# Patient Record
Sex: Male | Born: 1945 | Race: White | Hispanic: No | Marital: Married | Smoking: Former smoker
Health system: Southern US, Community
[De-identification: ages and names within clinical notes are randomized; demographics above are authoritative.]

## PROBLEM LIST (undated history)

## (undated) DIAGNOSIS — Z9989 Dependence on other enabling machines and devices: Secondary | ICD-10-CM

## (undated) DIAGNOSIS — I219 Acute myocardial infarction, unspecified: Secondary | ICD-10-CM

## (undated) DIAGNOSIS — Z8719 Personal history of other diseases of the digestive system: Secondary | ICD-10-CM

## (undated) DIAGNOSIS — E785 Hyperlipidemia, unspecified: Secondary | ICD-10-CM

## (undated) DIAGNOSIS — G43909 Migraine, unspecified, not intractable, without status migrainosus: Secondary | ICD-10-CM

## (undated) DIAGNOSIS — R7303 Prediabetes: Secondary | ICD-10-CM

## (undated) DIAGNOSIS — G4733 Obstructive sleep apnea (adult) (pediatric): Secondary | ICD-10-CM

## (undated) DIAGNOSIS — K219 Gastro-esophageal reflux disease without esophagitis: Secondary | ICD-10-CM

## (undated) DIAGNOSIS — I639 Cerebral infarction, unspecified: Secondary | ICD-10-CM

## (undated) DIAGNOSIS — I2510 Atherosclerotic heart disease of native coronary artery without angina pectoris: Secondary | ICD-10-CM

## (undated) DIAGNOSIS — E119 Type 2 diabetes mellitus without complications: Secondary | ICD-10-CM

## (undated) DIAGNOSIS — M1990 Unspecified osteoarthritis, unspecified site: Secondary | ICD-10-CM

## (undated) DIAGNOSIS — C44221 Squamous cell carcinoma of skin of unspecified ear and external auricular canal: Secondary | ICD-10-CM

## (undated) DIAGNOSIS — Z8711 Personal history of peptic ulcer disease: Secondary | ICD-10-CM

## (undated) DIAGNOSIS — IMO0002 Reserved for concepts with insufficient information to code with codable children: Secondary | ICD-10-CM

## (undated) DIAGNOSIS — I10 Essential (primary) hypertension: Secondary | ICD-10-CM

## (undated) HISTORY — PX: HERNIA REPAIR: SHX51

## (undated) HISTORY — PX: BACK SURGERY: SHX140

## (undated) HISTORY — DX: Reserved for concepts with insufficient information to code with codable children: IMO0002

## (undated) HISTORY — PX: CERVICAL DISC SURGERY: SHX588

## (undated) HISTORY — PX: TRIGGER FINGER RELEASE: SHX641

## (undated) HISTORY — DX: Atherosclerotic heart disease of native coronary artery without angina pectoris: I25.10

## (undated) HISTORY — PX: SHOULDER ARTHROSCOPY W/ ROTATOR CUFF REPAIR: SHX2400

## (undated) HISTORY — DX: Migraine, unspecified, not intractable, without status migrainosus: G43.909

## (undated) HISTORY — DX: Gastro-esophageal reflux disease without esophagitis: K21.9

## (undated) HISTORY — DX: Hyperlipidemia, unspecified: E78.5

---

## 1998-02-25 ENCOUNTER — Ambulatory Visit (HOSPITAL_BASED_OUTPATIENT_CLINIC_OR_DEPARTMENT_OTHER): Admission: RE | Admit: 1998-02-25 | Discharge: 1998-02-25 | Payer: Self-pay | Admitting: *Deleted

## 2001-02-06 ENCOUNTER — Encounter (INDEPENDENT_AMBULATORY_CARE_PROVIDER_SITE_OTHER): Payer: Self-pay

## 2001-02-06 ENCOUNTER — Ambulatory Visit (HOSPITAL_COMMUNITY): Admission: RE | Admit: 2001-02-06 | Discharge: 2001-02-06 | Payer: Self-pay | Admitting: Gastroenterology

## 2001-12-11 ENCOUNTER — Ambulatory Visit (HOSPITAL_COMMUNITY): Admission: RE | Admit: 2001-12-11 | Discharge: 2001-12-11 | Payer: Self-pay | Admitting: Cardiology

## 2001-12-11 ENCOUNTER — Encounter: Payer: Self-pay | Admitting: Cardiology

## 2002-08-22 ENCOUNTER — Encounter: Admission: RE | Admit: 2002-08-22 | Discharge: 2002-08-22 | Payer: Self-pay | Admitting: Internal Medicine

## 2002-08-22 ENCOUNTER — Encounter: Payer: Self-pay | Admitting: Internal Medicine

## 2004-11-11 ENCOUNTER — Ambulatory Visit: Payer: Self-pay | Admitting: Internal Medicine

## 2005-01-13 ENCOUNTER — Ambulatory Visit: Payer: Self-pay | Admitting: Internal Medicine

## 2005-02-09 ENCOUNTER — Ambulatory Visit: Payer: Self-pay | Admitting: Internal Medicine

## 2005-02-14 ENCOUNTER — Ambulatory Visit: Payer: Self-pay | Admitting: Internal Medicine

## 2005-03-02 ENCOUNTER — Ambulatory Visit: Payer: Self-pay | Admitting: Internal Medicine

## 2005-06-15 ENCOUNTER — Ambulatory Visit: Payer: Self-pay | Admitting: Internal Medicine

## 2005-07-28 ENCOUNTER — Ambulatory Visit: Payer: Self-pay | Admitting: Internal Medicine

## 2005-09-16 ENCOUNTER — Ambulatory Visit: Payer: Self-pay | Admitting: Internal Medicine

## 2005-10-31 ENCOUNTER — Ambulatory Visit: Payer: Self-pay | Admitting: Internal Medicine

## 2006-03-08 ENCOUNTER — Ambulatory Visit: Payer: Self-pay | Admitting: Internal Medicine

## 2006-05-08 ENCOUNTER — Ambulatory Visit: Payer: Self-pay | Admitting: Internal Medicine

## 2006-05-11 ENCOUNTER — Ambulatory Visit: Payer: Self-pay | Admitting: Internal Medicine

## 2006-08-29 ENCOUNTER — Ambulatory Visit (HOSPITAL_BASED_OUTPATIENT_CLINIC_OR_DEPARTMENT_OTHER): Admission: RE | Admit: 2006-08-29 | Discharge: 2006-08-29 | Payer: Self-pay | Admitting: Orthopedic Surgery

## 2006-10-18 ENCOUNTER — Encounter (INDEPENDENT_AMBULATORY_CARE_PROVIDER_SITE_OTHER): Payer: Self-pay | Admitting: *Deleted

## 2006-10-18 ENCOUNTER — Ambulatory Visit: Payer: Self-pay | Admitting: Internal Medicine

## 2006-10-18 LAB — CONVERTED CEMR LAB
ALT: 29 units/L (ref 0–40)
AST: 25 units/L (ref 0–37)
BUN: 11 mg/dL (ref 6–23)
CO2: 30 meq/L (ref 19–32)
Calcium: 9.4 mg/dL (ref 8.4–10.5)
Chloride: 104 meq/L (ref 96–112)
Chol/HDL Ratio, serum: 3.1
Cholesterol: 170 mg/dL (ref 0–200)
Creatinine, Ser: 0.9 mg/dL (ref 0.4–1.5)
GFR calc non Af Amer: 91 mL/min
Glomerular Filtration Rate, Af Am: 110 mL/min/{1.73_m2}
Glucose, Bld: 101 mg/dL — ABNORMAL HIGH (ref 70–99)
HCT: 44.6 % (ref 39.0–52.0)
HDL: 54.4 mg/dL (ref >39.0)
Hemoglobin: 15.1 g/dL (ref 13.0–17.0)
LDL Cholesterol: 101 mg/dL — ABNORMAL HIGH (ref 0–99)
MCHC: 33.9 g/dL (ref 30.0–36.0)
MCV: 94.3 fL (ref 78.0–100.0)
PSA: 1.24 ng/mL
PSA: 1.24 ng/mL (ref 0.10–4.00)
Platelets: 212 10*3/uL (ref 150–400)
Potassium: 4.6 meq/L (ref 3.5–5.1)
RBC: 4.72 M/uL (ref 4.22–5.81)
RDW: 12.6 % (ref 11.5–14.6)
Sodium: 140 meq/L (ref 135–145)
Triglyceride fasting, serum: 72 mg/dL (ref 0–149)
VLDL: 14 mg/dL (ref 0–40)
WBC: 4.8 10*3/uL (ref 4.5–10.5)

## 2007-04-02 DIAGNOSIS — G4730 Sleep apnea, unspecified: Secondary | ICD-10-CM | POA: Insufficient documentation

## 2007-04-02 DIAGNOSIS — K219 Gastro-esophageal reflux disease without esophagitis: Secondary | ICD-10-CM | POA: Insufficient documentation

## 2007-06-08 ENCOUNTER — Encounter: Payer: Self-pay | Admitting: Internal Medicine

## 2007-07-05 ENCOUNTER — Encounter: Payer: Self-pay | Admitting: Internal Medicine

## 2007-08-08 ENCOUNTER — Encounter: Payer: Self-pay | Admitting: Internal Medicine

## 2007-09-07 ENCOUNTER — Telehealth (INDEPENDENT_AMBULATORY_CARE_PROVIDER_SITE_OTHER): Payer: Self-pay | Admitting: *Deleted

## 2007-09-25 ENCOUNTER — Encounter: Payer: Self-pay | Admitting: Internal Medicine

## 2007-10-09 ENCOUNTER — Encounter: Payer: Self-pay | Admitting: Internal Medicine

## 2007-10-18 ENCOUNTER — Encounter: Payer: Self-pay | Admitting: Internal Medicine

## 2007-10-24 ENCOUNTER — Telehealth (INDEPENDENT_AMBULATORY_CARE_PROVIDER_SITE_OTHER): Payer: Self-pay | Admitting: *Deleted

## 2007-11-13 ENCOUNTER — Encounter: Payer: Self-pay | Admitting: Internal Medicine

## 2007-11-20 ENCOUNTER — Encounter: Payer: Self-pay | Admitting: Internal Medicine

## 2008-01-01 ENCOUNTER — Ambulatory Visit: Payer: Self-pay | Admitting: Internal Medicine

## 2008-01-01 LAB — CONVERTED CEMR LAB
ALT: 23 units/L (ref 0–53)
AST: 24 units/L (ref 0–37)
Albumin: 4 g/dL (ref 3.5–5.2)
Alkaline Phosphatase: 60 units/L (ref 39–117)
BUN: 13 mg/dL (ref 6–23)
Basophils Absolute: 0 10*3/uL (ref 0.0–0.1)
Basophils Relative: 0.6 % (ref 0.0–1.0)
Bilirubin, Direct: 0.1 mg/dL (ref 0.0–0.3)
CO2: 23 meq/L (ref 19–32)
Calcium: 9.4 mg/dL (ref 8.4–10.5)
Chloride: 106 meq/L (ref 96–112)
Cholesterol: 195 mg/dL (ref 0–200)
Creatinine, Ser: 0.9 mg/dL (ref 0.4–1.5)
Eosinophils Absolute: 0.1 10*3/uL (ref 0.0–0.6)
Eosinophils Relative: 1.4 % (ref 0.0–5.0)
GFR calc Af Amer: 109 mL/min
GFR calc non Af Amer: 90 mL/min
Glucose, Bld: 101 mg/dL — ABNORMAL HIGH (ref 70–99)
HCT: 44 % (ref 39.0–52.0)
HDL: 61.9 mg/dL (ref >39.0)
Hemoglobin: 14.8 g/dL (ref 13.0–17.0)
LDL Cholesterol: 107 mg/dL — ABNORMAL HIGH (ref 0–99)
Lymphocytes Relative: 18.1 % (ref 12.0–46.0)
MCHC: 33.5 g/dL (ref 30.0–36.0)
MCV: 94 fL (ref 78.0–100.0)
Monocytes Absolute: 0.6 10*3/uL (ref 0.2–0.7)
Monocytes Relative: 9.1 % (ref 3.0–11.0)
Neutro Abs: 4.9 10*3/uL (ref 1.4–7.7)
Neutrophils Relative %: 70.8 % (ref 43.0–77.0)
PSA: 0.69 ng/mL (ref 0.10–4.00)
Platelets: 179 10*3/uL (ref 150–400)
Potassium: 4.2 meq/L (ref 3.5–5.1)
RBC: 4.68 M/uL (ref 4.22–5.81)
RDW: 12.8 % (ref 11.5–14.6)
Sodium: 140 meq/L (ref 135–145)
TSH: 1.55 microintl units/mL (ref 0.35–5.50)
Total Bilirubin: 0.9 mg/dL (ref 0.3–1.2)
Total CHOL/HDL Ratio: 3.2
Total Protein: 7 g/dL (ref 6.0–8.3)
Triglycerides: 131 mg/dL (ref 0–149)
VLDL: 26 mg/dL (ref 0–40)
WBC: 6.8 10*3/uL (ref 4.5–10.5)

## 2008-01-07 ENCOUNTER — Encounter (INDEPENDENT_AMBULATORY_CARE_PROVIDER_SITE_OTHER): Payer: Self-pay | Admitting: *Deleted

## 2008-01-07 DIAGNOSIS — J309 Allergic rhinitis, unspecified: Secondary | ICD-10-CM | POA: Insufficient documentation

## 2008-01-07 DIAGNOSIS — N419 Inflammatory disease of prostate, unspecified: Secondary | ICD-10-CM | POA: Insufficient documentation

## 2008-01-07 DIAGNOSIS — Z86010 Personal history of colonic polyps: Secondary | ICD-10-CM | POA: Insufficient documentation

## 2008-01-07 DIAGNOSIS — Z8719 Personal history of other diseases of the digestive system: Secondary | ICD-10-CM

## 2008-01-07 DIAGNOSIS — Z8711 Personal history of peptic ulcer disease: Secondary | ICD-10-CM | POA: Insufficient documentation

## 2008-01-11 ENCOUNTER — Ambulatory Visit: Payer: Self-pay | Admitting: Internal Medicine

## 2008-01-11 DIAGNOSIS — E785 Hyperlipidemia, unspecified: Secondary | ICD-10-CM

## 2008-01-11 DIAGNOSIS — E1169 Type 2 diabetes mellitus with other specified complication: Secondary | ICD-10-CM | POA: Insufficient documentation

## 2008-01-11 DIAGNOSIS — K209 Esophagitis, unspecified without bleeding: Secondary | ICD-10-CM | POA: Insufficient documentation

## 2008-02-22 ENCOUNTER — Emergency Department (HOSPITAL_COMMUNITY): Admission: EM | Admit: 2008-02-22 | Discharge: 2008-02-22 | Payer: Self-pay | Admitting: Emergency Medicine

## 2008-04-11 ENCOUNTER — Ambulatory Visit: Payer: Self-pay | Admitting: Internal Medicine

## 2008-04-13 LAB — CONVERTED CEMR LAB
ALT: 25 units/L (ref 0–53)
AST: 25 units/L (ref 0–37)
Albumin: 3.8 g/dL (ref 3.5–5.2)
Alkaline Phosphatase: 59 units/L (ref 39–117)
Bilirubin, Direct: 0.1 mg/dL (ref 0.0–0.3)
Cholesterol: 254 mg/dL (ref 0–200)
Direct LDL: 183 mg/dL
HDL: 45.2 mg/dL (ref >39.0)
Hgb A1c MFr Bld: 6.2 % — ABNORMAL HIGH (ref 4.6–6.0)
Total Bilirubin: 0.7 mg/dL (ref 0.3–1.2)
Total CHOL/HDL Ratio: 5.6
Total Protein: 6.8 g/dL (ref 6.0–8.3)
Triglycerides: 148 mg/dL (ref 0–149)
VLDL: 30 mg/dL (ref 0–40)

## 2008-04-14 ENCOUNTER — Encounter (INDEPENDENT_AMBULATORY_CARE_PROVIDER_SITE_OTHER): Payer: Self-pay | Admitting: *Deleted

## 2008-04-23 ENCOUNTER — Ambulatory Visit: Payer: Self-pay | Admitting: Internal Medicine

## 2008-04-23 LAB — CONVERTED CEMR LAB
Cholesterol, target level: 200 mg/dL
HDL goal, serum: 40 mg/dL
LDL Goal: 130 mg/dL

## 2008-08-19 ENCOUNTER — Ambulatory Visit: Payer: Self-pay | Admitting: Internal Medicine

## 2008-08-19 LAB — CONVERTED CEMR LAB
ALT: 18 units/L (ref 0–53)
AST: 21 units/L (ref 0–37)
Alkaline Phosphatase: 56 units/L (ref 39–117)
Bilirubin, Direct: 0.1 mg/dL (ref 0.0–0.3)
Cholesterol: 193 mg/dL (ref 0–200)
Total Protein: 6.8 g/dL (ref 6.0–8.3)

## 2008-08-26 ENCOUNTER — Ambulatory Visit: Payer: Self-pay | Admitting: Internal Medicine

## 2008-08-26 ENCOUNTER — Telehealth: Payer: Self-pay | Admitting: Internal Medicine

## 2008-08-26 DIAGNOSIS — R7309 Other abnormal glucose: Secondary | ICD-10-CM | POA: Insufficient documentation

## 2008-12-04 ENCOUNTER — Ambulatory Visit: Payer: Self-pay | Admitting: Family Medicine

## 2009-02-24 ENCOUNTER — Ambulatory Visit: Payer: Self-pay | Admitting: Internal Medicine

## 2009-02-24 LAB — CONVERTED CEMR LAB
ALT: 23 units/L (ref 0–53)
AST: 22 units/L (ref 0–37)
Albumin: 3.7 g/dL (ref 3.5–5.2)
Basophils Relative: 0.7 % (ref 0.0–3.0)
Cholesterol: 185 mg/dL (ref 0–200)
Eosinophils Absolute: 0.1 10*3/uL (ref 0.0–0.7)
Hemoglobin: 13.9 g/dL (ref 13.0–17.0)
LDL Cholesterol: 109 mg/dL — ABNORMAL HIGH (ref 0–99)
Lymphocytes Relative: 25.3 % (ref 12.0–46.0)
MCHC: 33.5 g/dL (ref 30.0–36.0)
Monocytes Relative: 7.5 % (ref 3.0–12.0)
Neutro Abs: 4.1 10*3/uL (ref 1.4–7.7)
PSA: 0.62 ng/mL (ref 0.10–4.00)
RBC: 4.35 M/uL (ref 4.22–5.81)
Total Bilirubin: 0.5 mg/dL (ref 0.3–1.2)

## 2009-03-03 ENCOUNTER — Ambulatory Visit: Payer: Self-pay | Admitting: Internal Medicine

## 2009-03-03 DIAGNOSIS — Z85828 Personal history of other malignant neoplasm of skin: Secondary | ICD-10-CM | POA: Insufficient documentation

## 2009-03-31 ENCOUNTER — Encounter: Payer: Self-pay | Admitting: Internal Medicine

## 2009-05-14 ENCOUNTER — Encounter: Payer: Self-pay | Admitting: Internal Medicine

## 2009-09-02 ENCOUNTER — Ambulatory Visit: Payer: Self-pay | Admitting: Internal Medicine

## 2009-09-08 ENCOUNTER — Encounter (INDEPENDENT_AMBULATORY_CARE_PROVIDER_SITE_OTHER): Payer: Self-pay | Admitting: *Deleted

## 2009-09-08 LAB — CONVERTED CEMR LAB: Hgb A1c MFr Bld: 6 % (ref 4.6–6.5)

## 2009-10-13 ENCOUNTER — Ambulatory Visit (HOSPITAL_BASED_OUTPATIENT_CLINIC_OR_DEPARTMENT_OTHER): Admission: RE | Admit: 2009-10-13 | Discharge: 2009-10-13 | Payer: Self-pay | Admitting: Orthopedic Surgery

## 2009-10-14 ENCOUNTER — Encounter: Payer: Self-pay | Admitting: Internal Medicine

## 2009-10-28 ENCOUNTER — Ambulatory Visit: Payer: Self-pay | Admitting: Internal Medicine

## 2009-12-30 ENCOUNTER — Encounter: Payer: Self-pay | Admitting: Internal Medicine

## 2010-01-29 ENCOUNTER — Ambulatory Visit: Payer: Self-pay | Admitting: Internal Medicine

## 2010-01-29 DIAGNOSIS — R519 Headache, unspecified: Secondary | ICD-10-CM | POA: Insufficient documentation

## 2010-01-29 DIAGNOSIS — R51 Headache: Secondary | ICD-10-CM

## 2010-01-29 DIAGNOSIS — G8929 Other chronic pain: Secondary | ICD-10-CM | POA: Insufficient documentation

## 2010-01-29 DIAGNOSIS — H04129 Dry eye syndrome of unspecified lacrimal gland: Secondary | ICD-10-CM | POA: Insufficient documentation

## 2010-03-27 ENCOUNTER — Encounter: Payer: Self-pay | Admitting: Internal Medicine

## 2010-03-31 ENCOUNTER — Ambulatory Visit: Payer: Self-pay | Admitting: Internal Medicine

## 2010-04-03 LAB — CONVERTED CEMR LAB
Albumin: 4.1 g/dL (ref 3.5–5.2)
Basophils Relative: 0.5 % (ref 0.0–3.0)
CO2: 31 meq/L (ref 19–32)
Direct LDL: 137 mg/dL
Eosinophils Relative: 1.3 % (ref 0.0–5.0)
GFR calc non Af Amer: 83.21 mL/min (ref >60)
Glucose, Bld: 110 mg/dL — ABNORMAL HIGH (ref 70–99)
HDL: 52.7 mg/dL (ref >39.00)
Lymphocytes Relative: 30.7 % (ref 12.0–46.0)
Monocytes Relative: 7.3 % (ref 3.0–12.0)
Neutrophils Relative %: 60.2 % (ref 43.0–77.0)
PSA: 0.87 ng/mL (ref 0.10–4.00)
Potassium: 5 meq/L (ref 3.5–5.1)
RBC: 4.67 M/uL (ref 4.22–5.81)
Sodium: 141 meq/L (ref 135–145)
Total Bilirubin: 0.5 mg/dL (ref 0.3–1.2)
Total CHOL/HDL Ratio: 4
Triglycerides: 218 mg/dL — ABNORMAL HIGH (ref 0.0–149.0)
WBC: 6.1 10*3/uL (ref 4.5–10.5)

## 2010-04-06 LAB — CONVERTED CEMR LAB: Hgb A1c MFr Bld: 6.3 %

## 2010-05-04 ENCOUNTER — Ambulatory Visit: Payer: Self-pay | Admitting: Internal Medicine

## 2010-07-23 ENCOUNTER — Telehealth: Payer: Self-pay | Admitting: Internal Medicine

## 2010-12-12 LAB — CONVERTED CEMR LAB
Cholesterol, target level: 200 mg/dL
HDL goal, serum: 40 mg/dL
LDL Goal: 160 mg/dL

## 2010-12-14 NOTE — Assessment & Plan Note (Signed)
Summary: cpx/review lab/cbs   Vital Signs:  Patient profile:   65 year old male Height:      72.50 inches Weight:      215 pounds BMI:     28.86 Temp:     98.6 degrees F oral Pulse rate:   76 / minute Resp:     14 per minute BP sitting:   114 / 62  (left arm) Cuff size:   large  Vitals Entered By: Ok Anis CMA (May 04, 2010 1:05 PM) CC: CPX and review labs, refill meds, Headaches, Lipid Management Comments REVIEWED MED LIST, PATIENT AGREED DOSE AND INSTRUCTION CORRECT    CC:  CPX and review labs, refill meds, Headaches, and Lipid Management.  History of Present Illness: Here for Medicare AWV:  1.Risk factors based on Past M, S, F history:ERD, dyslipidemia, skin cancer risks assessed & record updated.[facility] data reviewed 2.Physical Activities: golf 3.Depression/mood: headaches affect mood 4 Hearing: whisper @ 6 ft;vision grossly intact with lenses (distant & near) 5.ADL's: no limitations 6.Fall Risk: none; gait & neuro exam neg 7.Home Safety: none identified 8.Height, weight, &visual acuity: 9.Counseling: no need revealed 10.Labs ordered based on risk factors: results reviewed & risk discussed 11. Referral Coordination: [facility] or  [facility] referral declined, prev seen @ both 12. Care Plan: see Instructions 13. Cognitive Assessment:writing, reading, memory , & recall normal ; oriented X 3. Headaches      This is a 65 year old man whose major complaint  is chronic , recurrent headaches ? since 2003.  The patient denies nausea, vomiting, sweats, tearing of eyes, nasal congestion, sinus pain, sinus pressure, photophobia, and phonophobia.  The headache is described as intermittent, dull, and pressure-like.  The location of the pain is upper frontal area.  High-risk features (red flags) include age >50 years.  The patient denies the following high-risk features: fever, neck pain/stiffness, vision loss or change, focal weakness, altered mental status, rash, trauma, pain  worse with exertion, new type of headache, immunosuppression, concomitant infection, and anticoagulation use.  The headaches are precipitated by food.  Prior treatment has included a triptan as well as several other meds [facility] & [facility].   Hyperlipidemia Follow-Up:   The patient denies muscle aches, GI upset, flushing, and itching.  The patient denies the following symptoms: syncope.  Compliance with medications (by patient report) has been near 100%.  Dietary compliance has been good.  Adjunctive measures currently used by the patient include fiber and ASA. LDL was 146 on 03/27/2010 @ [facility]  Lipid Management History:      Positive NCEP/ATP III risk factors include male age 13 years old or older.  Negative NCEP/ATP III risk factors include non-diabetic, no family history for ischemic heart disease, non-tobacco-user status, non-hypertensive, no ASHD (atherosclerotic heart disease), no prior stroke/TIA, no peripheral vascular disease, and no history of aortic aneurysm.     Preventive Screening-Counseling & Management  Alcohol-Tobacco     Alcohol drinks/day: 3     Alcohol type: beer     >5/day in last 3 mos: no     Alcohol Counseling: not indicated; use of alcohol is not excessive or problematic     Feels need to cut down: no     Feels annoyed by complaints: no     Feels guilty re: drinking: no     Needs 'eye opener' in am: no     Smoking Status: never     Packs/Day: 2.0     Year Started:  1961     Year Quit: 1981  Caffeine-Diet-Exercise     Caffeine use/day: 2 cups DECAF     Diet Comments: none     Does Patient Exercise: yes     Type of exercise: golf     Times/week: 3  Hep-HIV-STD-Contraception     Dental Visit-last 6 months yes     Sun Exposure-Excessive: no     Sun Exposure Counseling: not indicated; sun exposure is acceptable  Safety-Violence-Falls     Seat Belt Use: yes     Firearms in the Home: 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        Drug Use:  never.        Blood Transfusions:  no.        Travel History:  None.    Allergies: 1)  ! Imitrex 2)  ! Pcn  Past History:  Past Medical History: GERD Allergic rhinitis Colonic polyps, hx of gastric ulcer,PMH of hypercholesterolemia: [place] Study LDL goal = < 160 prostatitis recurrent sleep apnea, CPAP Skin cancer, hx of, Dr Nicholas Lose  Past Surgical History: Left shoulder sx.  Umblical hernia repair Cervical spine sx. (1998) Colonoscopy- polyps (2002) Tenosynovitis Cath. --essen. neg. (2003) Colonoscopy- 2 polyps (2007); recheck  negative  2010, Dr Kinnie Scales; Endo 2010 : [facility] Sinus surgery [facility] 11/08, Dr Ashley Royalty  Family History: Father: Colon cancer, DM, MI (died 49) Mother: Asthma, lung cancer Siblings: bro: DM, sister: emphysema  PGF:  ?bone cancer Paternal aunt:  DM  Social History: Retired Former Smoker Alcohol use-socially No diet  Smoking Status:  never Packs/Day:  2.0 Caffeine use/day:  2 cups DECAF Does Patient Exercise:  yes Dental Care w/in 6 mos.:  yes Sun Exposure-Excessive:  no Seat Belt Use:  yes Fall Risk:  none Sexual History:  currently monogamous Drug Use:  never Blood Transfusions:  no  Review of Systems General:  Complains of sleep disorder; Fatigue with headchaes.  CPAP has controlled  Sleep Apnea; his headache improved  off CPAP for several months but has recurred. Eyes:  Denies blurring, double vision, and vision loss-both eyes. ENT:  Denies decreased hearing, difficulty swallowing, hoarseness, and ringing in ears. CV:  Denies chest pain or discomfort, lightheadness, near fainting, shortness of breath with exertion, swelling of feet, and swelling of hands. Resp:  Denies excessive snoring; Occasional morning headache  & daytime somnulence. GI:  Denies abdominal pain, bloody stools, dark tarry stools, and indigestion. GU:  Denies discharge, dysuria, hematuria, and incontinence. MS:  Complains of stiffness; denies joint pain, joint redness, joint swelling, low back pain, mid back pain, and thoracic pain. Derm:  Denies changes in nail beds, dryness, and lesion(s). Neuro:  Denies brief paralysis, falling down, numbness, poor balance, tingling, and weakness. Psych:  Denies anxiety and depression. Endo:  Denies cold intolerance, excessive hunger, excessive thirst, excessive urination, and heat intolerance. Heme:  Denies abnormal bruising and bleeding. Allergy:  Complains of itching eyes and sneezing; Rx: Xyzal, Astelin .  Physical Exam  General:  well-nourished,in no acute distress; alert,appropriate and cooperative throughout examination Head:  Normocephalic and atraumatic without obvious abnormalities.  Eyes:  No corneal or conjunctival inflammation noted. EOMI. Perrla. Funduscopic exam  benign, without hemorrhages, exudates or papilledema. Vision grossly normal. FOV WNL Ears:  External ear exam shows no significant lesions or deformities.  Otoscopic examination reveals clear canals, tympanic membranes are intact bilaterally without bulging, retraction, inflammation or discharge. Hearing is grossly normal bilaterally. Nose:  External nasal examination shows no deformity or inflammation. Nasal mucosa are pink and moist without lesions or exudates. Mouth:  Oral mucosa and oropharynx without lesions or exudates.  Teeth in good repair. Upper plate Neck:  No deformities, masses, or tenderness noted. Lungs:  Normal respiratory effort, chest expands symmetrically. Lungs are clear to auscultation, no crackles or wheezes. Heart:  Normal rate and regular rhythm. S1 and S2 normal without gallop, murmur, click, rub .S4 Abdomen:  Bowel sounds positive,abdomen soft and non-tender without masses, organomegaly or hernias  noted. Rectal:  Colonscopy up to date Prostate:  PSA 0.482  on 03/27/2010 Msk:  No deformity or scoliosis noted of thoracic or lumbar spine.   Pulses:  R and L carotid,radial,dorsalis pedis and posterior tibial pulses are full and equal bilaterally Extremities:  No clubbing, cyanosis, edema, or deformity noted with normal full range of motion of all joints.   Neurologic:  alert & oriented X3, cranial nerves II-XII intact, strength normal in all extremities, sensation intact to light touch, gait normal, DTRs symmetrical and normal, finger-to-nose normal, and Romberg negative.   Skin:  Intact without suspicious lesions or rashes Cervical Nodes:  No lymphadenopathy noted Axillary Nodes:  No palpable lymphadenopathy Psych:  memory intact for recent and remote, normally interactive, good eye contact, not anxious appearing, and not depressed appearing.     Impression & Recommendations:  Problem # 1:  PREVENTIVE HEALTH CARE (ICD-V70.0)  Orders: Subsequent annual wellness visit with prevention plan (Z6109)  Problem # 2:  HEADACHE (ICD-784.0) Chronic, S/P evaluations @ [facility] & [facility] His updated medication list for this problem includes:    Baby Aspirin 81 Mg Chew (Aspirin) .Marland Kitchen... Take 1 tablet by mouth once a day as directed    Tramadol Hcl 50 Mg Tabs (Tramadol hcl) .Marland Kitchen... 1 q 6 hrs as needed pain  Problem # 3:  HYPERGLYCEMIA, FASTING (ICD-790.29) A1c 6.3%  Problem # 4:  HYPERLIPIDEMIA (ICD-272.2)  His updated medication list for this problem includes:    Crestor 20 Mg Tabs (Rosuvastatin calcium) .Marland Kitchen... 1 once daily  Problem # 5:  SLEEP APNEA (ICD-780.57) stable  Complete Medication List: 1)  Nexium 40 Mg Cpdr (Esomeprazole magnesium) .... Take 1 capsule by mouth once a day as needed 2)  Multivitamins Tabs (Multiple vitamin) .... Take 1 tablet by mouth once a day 3)  Vitamin C  4)  Baby Aspirin 81 Mg Chew (Aspirin) .... Take 1 tablet by mouth once a day as directed 5)   Astelin 137 Mcg/spray Soln (Azelastine hcl) .... As needed 6)  Crestor 20 Mg Tabs (Rosuvastatin calcium) .Marland Kitchen.. 1 once daily 7)  Levocetirizine Dihydrochloride 5 Mg Tabs (Levocetirizine dihydrochloride) .... Daily 8)  Vitamin D 1000 Unit Tabs (Cholecalciferol) .Marland Kitchen.. 1 by mouth once daily 9)  Tramadol Hcl 50 Mg Tabs (Tramadol hcl) .Marland Kitchen.. 1 q 6 hrs as needed pain 10)  Fluticasone Propionate 50 Mcg/act Susp (Fluticasone propionate) .Marland Kitchen.. 1 spray two times a day 11)  Amitriptyline Hcl 10 Mg Tabs (Amitriptyline hcl) .Marland Kitchen.. 1 at bedtime  as trial for headaches  Lipid Assessment/Plan:      Based on NCEP/ATP III, the patient's risk factor category is "0-1 risk factors".  The patient's lipid goals are as  follows: Total cholesterol goal is 200; LDL cholesterol goal is 160; HDL cholesterol goal is 40; Triglyceride goal is 150.  His LDL cholesterol goal has been met.  Secondary causes for hyperlipidemia have been ruled out.  He has been counseled on adjunctive measures for lowering his cholesterol and has been provided with dietary instructions.    Patient Instructions: 1)  Tramadol every 6 hrs as needed for headaches.Consume < 40 grams of High Fructose Corn Syrup "sugar" / day .It is important that you exercise regularly at least 20 minutes 5 times a week. If you develop chest pain, have severe difficulty breathing, or feel very tired , stop exercising immediately and seek medical attention. 2)  Take an  81 mg COATED Aspirin every day WITH a meal. 3)  Please schedule a follow-up appointment in 6 months. 4)  HbgA1C prior to visit, ICD-9:790.29. Prescriptions: AMITRIPTYLINE HCL 10 MG TABS (AMITRIPTYLINE HCL) 1 at bedtime  as trial for headaches  #30 x 5   Entered and Authorized by:   Marga Melnick MD   Signed by:   Marga Melnick MD on 05/05/2010   Method used:   Print then Give to Patient   RxID:   [id number] FLUTICASONE PROPIONATE 50 MCG/ACT SUSP (FLUTICASONE PROPIONATE) 1 spray two times a day  #3 x 3    Entered and Authorized by:   Marga Melnick MD   Signed by:   Marga Melnick MD on 05/04/2010   Method used:   Print then Give to Patient   RxID:   [id number] LEVOCETIRIZINE DIHYDROCHLORIDE 5 MG TABS (LEVOCETIRIZINE DIHYDROCHLORIDE) DAILY  #90 x 3   Entered and Authorized by:   Marga Melnick MD   Signed by:   Marga Melnick MD on 05/04/2010   Method used:   Print then Give to Patient   RxID:   [id number] ASTELIN 137 MCG/SPRAY  SOLN (AZELASTINE HCL) as needed  #3 x 3   Entered and Authorized by:   Marga Melnick MD   Signed by:   Marga Melnick MD on 05/04/2010   Method used:   Print then Give to Patient   RxID:   [id number] CRESTOR 20 MG  TABS (ROSUVASTATIN CALCIUM) 1 once daily  #90 x 3   Entered and Authorized by:   Marga Melnick MD   Signed by:   Marga Melnick MD on 05/04/2010   Method used:   Print then Give to Patient   RxID:   [id number]

## 2010-12-14 NOTE — Progress Notes (Signed)
Summary: ref to neuro  Phone Note Call from Patient Call back at Home Phone [phone] Call back at [phone]   Caller: Patient Summary of Call: patient was told if headache continued he would be referred to nuro  - he was seen at [facility] but still having headache - he would like referral  to  [facility] -attn med records -  fax [phone]  Initial call taken by: Okey Regal [place],  July 23, 2010 8:48 AM  Follow-up for Phone Call        dr hopper pls advise on referral.........Marland KitchenFelecia Deloach CMA  July 23, 2010 8:51 AM   Additional Follow-up for Phone Call Additional follow up Details #1::        ALL INFO WAS FAXED TO [place] [phone], FAX [phone], PATIENTS APPT IS 12-07-2010 @ 11AM W/DR Ninetta Lights (1ST AVAIL Cecilie Kicks PHYSICIAN) PT IS ON CXLATION LIST, I LM FOR PT TO CALL ME.     Additional Follow-up by: Magdalen Spatz [facility]  July 26, 2010 2:44 PM

## 2010-12-14 NOTE — Assessment & Plan Note (Signed)
Summary: SINUS PRESSURE/RH......Marland Kitchen   Vital Signs:  Patient profile:   65 year old male Weight:      219.4 pounds BMI:     29.45 Temp:     98.6 degrees F oral Pulse rate:   64 / minute Resp:     15 per minute BP sitting:   128 / 62  (left arm) Cuff size:   large  Vitals Entered By: Shonna Chock (January 29, 2010 11:56 AM) CC: Sinus pressure x 1 month  Comments REVIEWED MED LIST, PATIENT AGREED DOSE AND INSTRUCTION CORRECT    CC:  Sinus pressure x 1 month .  History of Present Illness: Gradual progression of constant pressure from eyes to crown X 2 months, variable in intensity. Rx: Tylenol with benefit. Astelin now prn, not on regular basis.Xyzal controls allergic symptoms of rhinitis & sneezing. 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  Allergies: 1)  ! Imitrex 2)  ! Pcn  Review of Systems General:  Denies chills, fever, and sweats. Eyes:  Denies blurring, discharge, double vision, eye pain, red eye, and vision loss-both eyes; Dry eyes, burning. Pain trying to focus on golf ball or paper. Ophth exam  for dry eyes 4 mos ago; Rx: Restasis. ENT:  Complains of postnasal drainage; denies ear discharge, earache, and nasal congestion; No facial pain or purulence. Resp:  Denies cough and sputum productive. Allergy:  Complains of itching eyes; denies sneezing.  Physical Exam  General:  well-nourished; alert,appropriate and cooperative throughout examination Eyes:  No corneal or conjunctival inflammation noted. EOMI. Perrla. Field of  Vision & vision  both  grossly normal. Ears:  External ear exam shows no significant lesions or deformities.  Otoscopic examination reveals clear canals, tympanic membranes are intact bilaterally without bulging, retraction, inflammation or discharge. Hearing is grossly normal bilaterally. Wax on R Nose:  External nasal examination shows no deformity or inflammation. Nasal mucosa are  pink and moist without lesions or exudates. Mouth:  Oral mucosa and oropharynx without lesions or exudates.  Teeth in good repair.Uvula gelatinous Pulses:  R and L carotid  pulses are full and equal bilaterally Neurologic:  alert & oriented X3, cranial nerves II-XII intact, strength normal in all extremities, sensation intact to light touch, gait normal, DTRs symmetrical and normal, finger-to-nose normal, and Romberg negative.   Skin:  Intact without suspicious lesions or rashes Cervical Nodes:  No lymphadenopathy noted Axillary Nodes:  No palpable lymphadenopathy Psych:  memory intact for recent and remote, normally interactive, and good eye contact.     Impression & Recommendations:  Problem # 1:  HEADACHE (ICD-784.0)  His updated medication list for this problem includes:    Baby Aspirin 81 Mg Chew (Aspirin) .Marland Kitchen... Take 1 tablet by mouth once a day as directed    Tramadol Hcl 50 Mg Tabs (Tramadol hcl) .Marland Kitchen... 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 Take 1 capsule by mouth once a day as needed 2)  Multivitamins Tabs (Multiple vitamin) .... Take 1 tablet by mouth once a day 3)  Vitamin C  4)  Baby Aspirin 81 Mg Chew (Aspirin) .... Take 1 tablet by mouth once a day as directed 5)  Astelin 137 Mcg/spray Soln (Azelastine hcl) .... As  needed 6)  Crestor 20 Mg Tabs (Rosuvastatin calcium) .Marland Kitchen.. 1 qd 7)  Restasis 0.05 % Emul (Cyclosporine) .... As needed 8)  Levocetirizine Dihydrochloride 5 Mg Tabs (Levocetirizine dihydrochloride) .... Daily 9)  Vitamin D 1000 Unit Tabs (Cholecalciferol) .Marland Kitchen.. 1 by mouth once daily 10)  Tramadol Hcl 50 Mg Tabs (Tramadol hcl) .Marland Kitchen.. 1 q 6 hrs as needed pain 11)  Fluticasone Propionate 50 Mcg/act Susp (Fluticasone  propionate) .Marland Kitchen.. 1 spray two times a day  Patient Instructions: 1)  Repeat Ophth exam & CT of sinuses if no better. Prescriptions: FLUTICASONE PROPIONATE 50 MCG/ACT SUSP (FLUTICASONE PROPIONATE) 1 spray two times a day  #1 x 5   Entered and Authorized by:   Marga Melnick MD   Signed by:   Marga Melnick MD on 01/29/2010   Method used:   Faxed to ...       [facility] [address] (retail)       [phone]       [place], [place]  [address]       Ph: [phone]       Fax: [phone]   RxID:   [id number] TRAMADOL HCL 50 MG TABS (TRAMADOL HCL) 1 q 6 hrs as needed pain  #30 x 1   Entered and Authorized by:   Marga Melnick MD   Signed by:   Marga Melnick MD on 01/29/2010   Method used:   Faxed to ...       [facility] [address] (retail)       [phone]       [place], [place]  [address]       Ph: [phone]       Fax: [phone]   RxID:   [id number]

## 2010-12-14 NOTE — Letter (Signed)
Summary: [facility]  [facility]   Imported By: Lanelle Bal 05/12/2010 11:30:02  _____________________________________________________________________  External Attachment:    Type:   Image     Comment:   External Document

## 2010-12-14 NOTE — Letter (Signed)
Summary: [facility]   Imported By: Lanelle Bal 01/21/2010 10:13:37  _____________________________________________________________________  External Attachment:    Type:   Image     Comment:   External Document

## 2011-04-01 NOTE — Op Note (Signed)
NAME:  Robert [name], BANTON NO.:  [id number]   MEDICAL RECORD NO.:  [id number]          PATIENT TYPE:  AMB   LOCATION:  [facility]                          FACILITY:  [facility]   PHYSICIAN:  Cindee Salt, M.D.       DATE OF BIRTH:  13-Apr-1946   DATE OF PROCEDURE:  08/29/2006  DATE OF DISCHARGE:                                 OPERATIVE REPORT   PREOPERATIVE DIAGNOSIS:  Stenosing tenosynovitis left ring finger.   POSTOPERATIVE DIAGNOSIS:  Stenosing tenosynovitis left ring finger.   OPERATION:  Release A1 pulley left ring finger.   SURGEON:  Cindee Salt, M.D.   ASSISTANT:  Carolyne Fiscal R.N.   ANESTHESIA:  Forearm based IV regional.   HISTORY:  The patient is a 65 year old male with a history of triggering of  his left ring finger.  This has not responded to conservative treatment.  He  is desirous of proceeding to have this surgically released in the  preoperative area. The questions were answered and encouraged.  The  extremity marked by both the patient and surgeon.   PROCEDURE:  The patient was brought to the operating room where forearm  based IV regional anesthetic was carried out without difficulty.  He was  prepped using DuraPrep, supine position, left arm free.  An oblique incision  was made over the A1 pulley of the left ring finger, carried down through  subcutaneous tissue.  The neurovascular structures were identified and  protected.  Bleeders electrocauterized.  The A1 pulley was found to be  markedly thickened.  No cysts were identified.  The A1 pulley was then  released on the radial aspect.  An incision was made in the central aspect  of the A2 pulley.  The finger placed through full range motion, no further  triggering was identified.  The wound was irrigated with saline.  The skin  was then closed with interrupted 5-0 nylon sutures.  Sterile compressive  dressing to the hand was applied.  The patient tolerated the procedure well  and was taken to the recovery room  for observation in satisfactory  condition.  He is discharged home to return to the [facility]  in 1 week on Vicodin.           ______________________________  Cindee Salt, M.D.     GK/MEDQ  D:  08/29/2006  T:  08/30/2006  Job:  [id number]

## 2011-04-01 NOTE — Cardiovascular Report (Signed)
[place]  Patient:    QUE, Robert [name] Visit Number: [id number] MRN: [id number]          Service Type: CAT Location: [facility] 2899 [id number] Attending Physician:  Lenoria Farrier Dictated by:   Everardo Beals Juanda Chance, M.D. [facility] Proc. Date: 12/11/01 Admit Date:  12/11/2001 Discharge Date: 12/11/2001   CC:         Titus Dubin. Alwyn Ren, M.D.   Cardiac Catheterization  CLINICAL HISTORY:  [name] is 65 years old, and had chest pain which was sometimes exertional.  He had a Cardiolite scan ordered by Dr. Alwyn Ren which suggested inferior ischemia.  I saw him in consultation.  We scheduled him for evaluation with angiography.  DESCRIPTION OF PROCEDURE:  The operative procedure was performed by the right femoral artery using an arterial sheath and 6 French pre-formed coronary catheter.  ______ was performed, and Omnipaque contrast was used.  A ______ gram was performed to rule out abdominal aortic aneurysm.  The right femoral artery was closed with Perclose at the end of the procedure.  The patient tolerated the procedure well, ______ at completion.  RESULTS: 1. 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 2. Left anterior descending artery gave rise to a septal perforator and a    diagonal branch.  These and the left anterior descending artery proper were    free of significant disease. 3. The second circumflex artery gave rise to a marginal branch and two small    posterolateral branches.  These were also free of significant disease. 4. The right coronary artery was a moderate sized vessel, it gave rise to a    right ventricular branch, posterior descending branch, and a posterolateral    branch.  There were irregulars, but no major obstruction. 5. The left ventriculogram performed in RAO projection showed good wall    motion, no evidence of hypokinesis.  The estimated ejection fraction was    60%. 6. Distal aortogram showed patent renal arteries and no  significant iliac    obstruction.  CONCLUSION:  Minimal irregularities in the right coronary artery with no obstructive coronary artery disease and normal left ventricular function.  RECOMMENDATIONS:  Reassurance.  There is no threat of ischemia.  The patients symptoms were somewhat atypical, and in view of these findings, I think the abnormal stress Cardiolite was probably a false reading.  We will plan reassurance and continued primary risk factor modification.Dictated by:   Everardo Beals Juanda Chance, M.D. [facility] Attending Physician:  Lenoria Farrier DD:  12/11/01 TD:  12/12/01 Job: [id number]

## 2011-06-28 ENCOUNTER — Telehealth (INDEPENDENT_AMBULATORY_CARE_PROVIDER_SITE_OTHER): Payer: Self-pay

## 2011-06-28 NOTE — Telephone Encounter (Signed)
Pt called EA:VWUJW out of town Thursday. Pt has appt to have hernia evalulated with Dr Abbey Chatters. Pt states hernia safely reduces. Pt wants to know if he can safely travel. I advised pt to buy a hernia support at [facility] and no heavy lifting,pushing or pulling. Signs of incarceration are reviewed with pt. Pt aware to go to local ER in [place].Marland Kitchen

## 2011-07-27 ENCOUNTER — Ambulatory Visit (INDEPENDENT_AMBULATORY_CARE_PROVIDER_SITE_OTHER): Payer: Medicare Other | Admitting: General Surgery

## 2011-07-27 ENCOUNTER — Encounter (INDEPENDENT_AMBULATORY_CARE_PROVIDER_SITE_OTHER): Payer: Self-pay | Admitting: General Surgery

## 2011-07-27 VITALS — BP 146/76 | HR 60 | Temp 96.4°F | Ht 73.0 in | Wt 211.0 lb

## 2011-07-27 DIAGNOSIS — K4090 Unilateral inguinal hernia, without obstruction or gangrene, not specified as recurrent: Secondary | ICD-10-CM

## 2011-07-27 NOTE — Patient Instructions (Signed)
Call after your sleep study to schedule your surgery.

## 2011-07-27 NOTE — Progress Notes (Signed)
Chief Complaint  Patient presents with  . Other    new pt- eval of left inguinal hernia     HPI Robert [name] is a 65 y.o. male.   HPI  He is self-referred for evaluation of a left inguinal hernia.  He has had this for about a year.  He noticed a swelling in the left groin.  He was evaluated by a physician at the [facility] and had a  CT scan which demonstrated a left inguinal hernia containing fatty tissue.  He was asx at the time.  He saw a Careers adviser at the [place], but decided not to proceed with an operation.  Over the past 3 weeks, the hernia has become larger and intermittently uncomfortable.  No obstructive sxs. No difficulty with urination or BMS.  No chronic cough. He is now interested in repair.  Past Medical History  Diagnosis Date  . GERD (gastroesophageal reflux disease)   . Cancer     Past Surgical History  Procedure Date  . Back surgery   . Shoulder surgery   . Hand surgery     Family History  Problem Relation Age of Onset  . Cancer Mother   . Heart disease Father     heart attack     Social History History  Substance Use Topics  . Smoking status: Former Games developer  . Smokeless tobacco: Not on file  . Alcohol Use: 0.0 oz/week    5-6 Cans of beer per week    Allergies  Allergen Reactions  . Penicillins   . Sumatriptan     REACTION: sob, ha, scalp sensitivity    Current Outpatient Prescriptions  Medication Sig Dispense Refill  . levocetirizine (XYZAL) 5 MG tablet       . omeprazole (PRILOSEC) 20 MG capsule Take 20 mg by mouth daily.        . rosuvastatin (CRESTOR) 20 MG tablet Take 20 mg by mouth daily.        . traMADol (ULTRAM) 50 MG tablet Take 50 mg by mouth every 6 (six) hours as needed.          Review of Systems Review of Systems  Constitutional: Negative.   Respiratory:       Has sleep apnea.  CPAP machine is broken.  He is going to the [place] for a sleep study early next month.  Cardiovascular: Negative.   Gastrointestinal: Negative.     Genitourinary: Negative for urgency, difficulty urinating and testicular pain.    Blood pressure 146/76, pulse 60, temperature 96.4 F (35.8 C), height 6\' 1"  (1.854 m), weight 211 lb (95.709 kg).  Physical Exam Physical Exam  Constitutional: He appears well-developed and well-nourished. No distress.  Eyes: Conjunctivae are normal. Pupils are equal, round, and reactive to light.  Neck: No thyromegaly present.  Cardiovascular: Normal rate and regular rhythm.   No murmur heard. Pulmonary/Chest: Effort normal and breath sounds normal.  Abdominal: Soft. He exhibits no distension and no mass. There is no tenderness.       Subumbilical scar.  Genitourinary: Penis normal.       Moderate to large sized left inguinal bulge that is reducible in the supine position. No right inguinal bulge.  Musculoskeletal: He exhibits no edema.    Data Reviewed  Assessment    Enlarging and symptomatic left inguinal hernia. He is interested in Biomedical scientist.    Plan    Laparoscopic left inguinal hernia repair with mesh.  First, however, he is to complete  his sleep apnea evaluation. He will call after this is done.  I have explained the procedure, risks, and aftercare of inguinal hernia repair.  Risks include but are not limited to bleeding, infection, wound problems, anesthesia, recurrence, bladder or intestine injury, urinary retention, testicular dysfunction, chronic pain, mesh problems.  He seems to understand and agrees to proceed.       Nylia Gavina J 07/27/2011, 10:30 AM

## 2011-09-01 ENCOUNTER — Ambulatory Visit (INDEPENDENT_AMBULATORY_CARE_PROVIDER_SITE_OTHER): Payer: Self-pay | Admitting: Family Medicine

## 2011-09-01 ENCOUNTER — Encounter: Payer: Self-pay | Admitting: Family Medicine

## 2011-09-01 VITALS — BP 120/64 | Temp 98.2°F | Ht 73.0 in | Wt 212.4 lb

## 2011-09-01 DIAGNOSIS — R51 Headache: Secondary | ICD-10-CM

## 2011-09-01 MED ORDER — KETOROLAC TROMETHAMINE 60 MG/2ML IM SOLN
60.0000 mg | Freq: Once | INTRAMUSCULAR | Status: AC
  Administered 2011-09-01: 60 mg via INTRAMUSCULAR

## 2011-09-01 NOTE — Progress Notes (Signed)
  Subjective:    Patient ID: Robert [name], male    DOB: 09/13/1946, 65 y.o.   MRN: [id number]  HPI HA- pt was seen at [facility] on 10/1 and given bactrim for a sinus infxn.  Saw Dr [place] Callas on 10/12 and was tx'd w/ prednisone and Zpack for similar sxs.  Returns today w/ similar frontal HA, eye pain, nausea, fatigue.  Hx of migraines.  Some photophobia, no phonophobia.  HA will somewhat improve w/ lying down, sleep, tramadol (pt's usual med for migraine).  No dizziness, focal weakness or numbness.   Review of Systems For ROS see HPI     Objective:   Physical Exam  Vitals reviewed. Constitutional: He is oriented to person, place, and time. He appears well-developed and well-nourished. No distress.  HENT:  Head: Normocephalic and atraumatic.  Nose: Nose normal.  Mouth/Throat: Oropharynx is clear and moist.       No TTP over sinuses TMs normal  Eyes: Conjunctivae and EOM are normal. Pupils are equal, round, and reactive to light.  Neck: Normal range of motion. Neck supple.  Cardiovascular: Normal rate, regular rhythm and normal heart sounds.   Pulmonary/Chest: Effort normal and breath sounds normal. No respiratory distress.  Lymphadenopathy:    He has no cervical adenopathy.  Neurological: He is alert and oriented to person, place, and time. He has normal reflexes. No cranial nerve deficit. Coordination normal.          Assessment & Plan:

## 2011-09-01 NOTE — Patient Instructions (Signed)
This seems to be an untreated Migraine Continue the Tramadol as needed for pain The Toradol injection should help! Drink plenty of fluids You can take Tylenol as needed for additional pain relief REST! Call if symptoms change or worsen Hang in there!

## 2011-09-01 NOTE — Assessment & Plan Note (Signed)
Pt's sxs have been treated twice as sinusitis w/out improvement.  sxs more consistent w/ migraine than infxn.  Give toradol injxn and pt to use Ultram as he would for typical migraine.  No red flags on hx or PE.  If HA doesn't improve by early next week will need CT.  Reviewed supportive care and red flags that should prompt return.  Pt expressed understanding and is in agreement w/ plan.

## 2011-09-06 ENCOUNTER — Telehealth: Payer: Self-pay | Admitting: *Deleted

## 2011-09-06 DIAGNOSIS — R51 Headache: Secondary | ICD-10-CM

## 2011-09-06 NOTE — Telephone Encounter (Signed)
Pt advised he was seen on Thursday for a migrane headache. You had advised if no relief by Monday to call office per may want to set up a CT-Scan

## 2011-09-06 NOTE — Telephone Encounter (Signed)
Please enter the order for a noncontrast head CT- dx headache, ask Renee if this can be done today

## 2011-09-06 NOTE — Telephone Encounter (Signed)
Placed order for non contrast CT Scan dx headache at [facility], contacted Luster Landsberg, advised Beverely Low wants ASAP, Luster Landsberg will review and contact pt with appt information

## 2011-09-07 ENCOUNTER — Ambulatory Visit (INDEPENDENT_AMBULATORY_CARE_PROVIDER_SITE_OTHER)
Admission: RE | Admit: 2011-09-07 | Discharge: 2011-09-07 | Disposition: A | Discharge status: Home / Self Care | Payer: Medicare Other | Source: Ambulatory Visit | Attending: Family Medicine | Admitting: Family Medicine

## 2011-09-07 DIAGNOSIS — R51 Headache: Secondary | ICD-10-CM

## 2011-09-19 ENCOUNTER — Ambulatory Visit (INDEPENDENT_AMBULATORY_CARE_PROVIDER_SITE_OTHER): Payer: Medicare Other | Admitting: Family Medicine

## 2011-09-19 ENCOUNTER — Encounter: Payer: Self-pay | Admitting: Family Medicine

## 2011-09-19 VITALS — BP 130/75 | HR 60 | Temp 97.3°F | Ht 73.5 in | Wt 209.8 lb

## 2011-09-19 DIAGNOSIS — R51 Headache: Secondary | ICD-10-CM

## 2011-09-19 LAB — CBC WITH DIFFERENTIAL/PLATELET
Eosinophils Relative: 0.8 % (ref 0.0–5.0)
HCT: 46.2 % (ref 39.0–52.0)
Hemoglobin: 15.4 g/dL (ref 13.0–17.0)
Lymphs Abs: 1.7 10*3/uL (ref 0.7–4.0)
MCV: 97.9 fl (ref 78.0–100.0)
Monocytes Absolute: 0.5 10*3/uL (ref 0.1–1.0)
Monocytes Relative: 5.9 % (ref 3.0–12.0)
Neutro Abs: 5.7 10*3/uL (ref 1.4–7.7)
WBC: 7.9 10*3/uL (ref 4.5–10.5)

## 2011-09-19 LAB — BASIC METABOLIC PANEL
BUN: 14 mg/dL (ref 6–23)
CO2: 30 mEq/L (ref 19–32)
Calcium: 9.5 mg/dL (ref 8.4–10.5)
Creatinine, Ser: 1 mg/dL (ref 0.4–1.5)
GFR: 83.85 mL/min (ref >60.00)
Glucose, Bld: 81 mg/dL (ref 70–99)

## 2011-09-19 NOTE — Progress Notes (Signed)
  Subjective:    Patient ID: Robert [name], male    DOB: November 29, 1945, 65 y.o.   MRN: [id number]  HPI HA- intermittent x2 months.  Had normal head CT.  Treated previously x2 for sinusitis.  Hx of migraines.  Will feel as if he is improving and then 'backslide'.  Again started feeling poorly yesterday.  Associated nausea.  HAs are behind eyes, frontal, and coronal.  No ear pain, nasal congestion, sore throat.  No fevers.  Denies focal weakness or numbness.   Review of Systems For ROS see HPI     Objective:   Physical Exam  Vitals reviewed. Constitutional: He is oriented to person, place, and time. He appears well-developed and well-nourished. No distress.  HENT:  Head: Normocephalic and atraumatic.       No TTP over sinuses + turbinate edema + PND TMs normal bilaterally  Eyes: Conjunctivae and EOM are normal. Pupils are equal, round, and reactive to light.  Neck: Normal range of motion. Neck supple.  Cardiovascular: Normal rate, regular rhythm and normal heart sounds.   Pulmonary/Chest: Effort normal and breath sounds normal. No respiratory distress. He has no wheezes.  Lymphadenopathy:    He has no cervical adenopathy.  Neurological: He is alert and oriented to person, place, and time. He has normal reflexes. No cranial nerve deficit. Coordination normal.  Skin: Skin is warm and dry.          Assessment & Plan:

## 2011-09-19 NOTE — Patient Instructions (Signed)
We'll notify you of your lab results Continue the xyzal daily- add the Nasonex, 2 sprays each nostril daily Drink plenty of fluids Treat the pain w/ tylenol and tramadol If no relief in next week or so- call and we'll set you up to see a headache specialist Hang in there!

## 2011-09-19 NOTE — Assessment & Plan Note (Signed)
Unclear as to etiology of pt's pain.  He reports this is different from 'typical migraine' but doesn't feel it is a sinus infection either.  No TTP over sinuses, neuro exam WNL.  Had recent normal head CT.  This could be sinus inflammation rather than infxn.  Sample of nasonex given.  Check labs to r/o infxn, inflammation, metabolic abnormality.  If no improvement will need to see neuro.  Pt expressed understanding and is in agreement w/ plan.

## 2011-09-23 ENCOUNTER — Telehealth: Payer: Self-pay | Admitting: Family Medicine

## 2011-09-23 MED ORDER — MOMETASONE FUROATE 50 MCG/ACT NA SUSP: 2.0000 | Freq: Every day | NASAL | Status: DC

## 2011-09-23 NOTE — Telephone Encounter (Signed)
Spoke to pt to advise rx for nasonax is at pharmacy per rx sent to pharmacy by e-script Pt understood

## 2011-09-23 NOTE — Telephone Encounter (Signed)
Patient was given sample of nasonex - he would like rx called in [facility] [address]  ---if patient is to use this for extended length of time,  he would like 90 day supply

## 2011-10-10 ENCOUNTER — Inpatient Hospital Stay (HOSPITAL_COMMUNITY)
Admission: EM | Admit: 2011-10-10 | Discharge: 2011-10-14 | DRG: 247 | Disposition: A | Discharge status: Home / Self Care | Payer: Medicare Other | Attending: Internal Medicine | Admitting: Internal Medicine

## 2011-10-10 ENCOUNTER — Emergency Department (HOSPITAL_COMMUNITY): Payer: Medicare Other

## 2011-10-10 ENCOUNTER — Other Ambulatory Visit: Payer: Self-pay

## 2011-10-10 DIAGNOSIS — K219 Gastro-esophageal reflux disease without esophagitis: Secondary | ICD-10-CM | POA: Diagnosis present

## 2011-10-10 DIAGNOSIS — R079 Chest pain, unspecified: Secondary | ICD-10-CM | POA: Diagnosis present

## 2011-10-10 DIAGNOSIS — E1169 Type 2 diabetes mellitus with other specified complication: Secondary | ICD-10-CM | POA: Insufficient documentation

## 2011-10-10 DIAGNOSIS — K209 Esophagitis, unspecified without bleeding: Secondary | ICD-10-CM | POA: Diagnosis present

## 2011-10-10 DIAGNOSIS — I252 Old myocardial infarction: Secondary | ICD-10-CM | POA: Diagnosis present

## 2011-10-10 DIAGNOSIS — E785 Hyperlipidemia, unspecified: Secondary | ICD-10-CM | POA: Diagnosis present

## 2011-10-10 DIAGNOSIS — Z6826 Body mass index (BMI) 26.0-26.9, adult: Secondary | ICD-10-CM

## 2011-10-10 DIAGNOSIS — Z859 Personal history of malignant neoplasm, unspecified: Secondary | ICD-10-CM

## 2011-10-10 DIAGNOSIS — Z7902 Long term (current) use of antithrombotics/antiplatelets: Secondary | ICD-10-CM

## 2011-10-10 DIAGNOSIS — I2510 Atherosclerotic heart disease of native coronary artery without angina pectoris: Secondary | ICD-10-CM | POA: Insufficient documentation

## 2011-10-10 DIAGNOSIS — I214 Non-ST elevation (NSTEMI) myocardial infarction: Principal | ICD-10-CM | POA: Diagnosis present

## 2011-10-10 DIAGNOSIS — E7800 Pure hypercholesterolemia, unspecified: Secondary | ICD-10-CM | POA: Diagnosis present

## 2011-10-10 DIAGNOSIS — R7309 Other abnormal glucose: Secondary | ICD-10-CM | POA: Diagnosis present

## 2011-10-10 DIAGNOSIS — I152 Hypertension secondary to endocrine disorders: Secondary | ICD-10-CM | POA: Diagnosis present

## 2011-10-10 DIAGNOSIS — Z7982 Long term (current) use of aspirin: Secondary | ICD-10-CM

## 2011-10-10 DIAGNOSIS — E1159 Type 2 diabetes mellitus with other circulatory complications: Secondary | ICD-10-CM | POA: Diagnosis present

## 2011-10-10 DIAGNOSIS — G4730 Sleep apnea, unspecified: Secondary | ICD-10-CM | POA: Diagnosis present

## 2011-10-10 DIAGNOSIS — Z880 Allergy status to penicillin: Secondary | ICD-10-CM

## 2011-10-10 DIAGNOSIS — I10 Essential (primary) hypertension: Secondary | ICD-10-CM | POA: Diagnosis present

## 2011-10-10 DIAGNOSIS — G43909 Migraine, unspecified, not intractable, without status migrainosus: Secondary | ICD-10-CM | POA: Diagnosis present

## 2011-10-10 DIAGNOSIS — Z888 Allergy status to other drugs, medicaments and biological substances status: Secondary | ICD-10-CM

## 2011-10-10 DIAGNOSIS — Z79899 Other long term (current) drug therapy: Secondary | ICD-10-CM

## 2011-10-10 HISTORY — DX: Atherosclerotic heart disease of native coronary artery without angina pectoris: I25.10

## 2011-10-10 LAB — CBC
Platelets: 173 10*3/uL (ref 150–400)
RBC: 4.43 MIL/uL (ref 4.22–5.81)
WBC: 6.3 10*3/uL (ref 4.0–10.5)

## 2011-10-10 LAB — DIFFERENTIAL
Eosinophils Absolute: 0.1 10*3/uL (ref 0.0–0.7)
Lymphocytes Relative: 28 % (ref 12–46)
Lymphs Abs: 1.8 10*3/uL (ref 0.7–4.0)
Neutro Abs: 3.9 10*3/uL (ref 1.7–7.7)
Neutrophils Relative %: 62 % (ref 43–77)

## 2011-10-10 LAB — POCT I-STAT, CHEM 8
Calcium, Ion: 1.21 mmol/L (ref 1.12–1.32)
Chloride: 105 mEq/L (ref 96–112)
HCT: 44 % (ref 39.0–52.0)
Potassium: 4.2 mEq/L (ref 3.5–5.1)

## 2011-10-10 LAB — POCT I-STAT TROPONIN I: Troponin i, poc: 0 ng/mL (ref 0.00–0.08)

## 2011-10-10 LAB — D-DIMER, QUANTITATIVE: D-Dimer, Quant: 1.1 ug/mL-FEU — ABNORMAL HIGH (ref 0.00–0.48)

## 2011-10-10 MED ORDER — NITROGLYCERIN 2 % TD OINT
1.0000 [in_us] | TOPICAL_OINTMENT | Freq: Once | TRANSDERMAL | Status: AC
  Administered 2011-10-10: 1 [in_us] via TOPICAL
  Filled 2011-10-10: qty 30

## 2011-10-10 MED ORDER — IOHEXOL 300 MG/ML  SOLN
100.0000 mL | Freq: Once | INTRAMUSCULAR | Status: AC | PRN
  Administered 2011-10-10: 100 mL via INTRAVENOUS

## 2011-10-10 MED ORDER — ASPIRIN 81 MG PO CHEW
162.0000 mg | CHEWABLE_TABLET | Freq: Once | ORAL | Status: AC
  Administered 2011-10-10: 162 mg via ORAL
  Filled 2011-10-10: qty 1

## 2011-10-10 MED ORDER — NITROGLYCERIN 0.4 MG SL SUBL
0.4000 mg | SUBLINGUAL_TABLET | SUBLINGUAL | Status: DC | PRN
  Administered 2011-10-10 (×2): 0.4 mg via SUBLINGUAL
  Filled 2011-10-10 (×2): qty 25

## 2011-10-10 MED ORDER — MORPHINE SULFATE 4 MG/ML IJ SOLN
4.0000 mg | Freq: Once | INTRAMUSCULAR | Status: AC
  Administered 2011-10-10: 4 mg via INTRAVENOUS
  Filled 2011-10-10: qty 1

## 2011-10-10 MED ORDER — SODIUM CHLORIDE 0.9 % IV SOLN
INTRAVENOUS | Status: DC
  Administered 2011-10-10: 19:00:00 via INTRAVENOUS

## 2011-10-10 NOTE — ED Notes (Signed)
Troponin i-stat is a duplicate order. Lab aware.

## 2011-10-10 NOTE — ED Notes (Signed)
Le, MD at bedside.  

## 2011-10-10 NOTE — ED Notes (Signed)
Heaviness of chest, SOB, pain into neck, nausea

## 2011-10-10 NOTE — ED Provider Notes (Signed)
History     CSN: [id number] Arrival date & time: 10/10/2011  5:54 PM   First MD Initiated Contact with Patient 10/10/11 1829      Chief Complaint  Patient presents with  . Chest Pain    (Consider location/radiation/quality/duration/timing/severity/associated sxs/prior treatment) HPI Complains of anterior chest pain radiating to anterior neck onset this afternoon. First episode lasted 10 minutes onset after mild exertion second episode onset one hour ago while at rest. Symptoms accompanied by shortness of breath no nausea no sweatiness has never had similar discomfort before. Treated himself with 2 baby aspirin this without relief nothing makes symptoms better or worse pain is dull in nature. Moderate in severity at present Past Medical History  Diagnosis Date  . GERD (gastroesophageal reflux disease)   . Cancer   . Migraines    hypercholesterolemia History skin cancer Past Surgical History  Procedure Date  . Back surgery   . Shoulder surgery   . Hand surgery     Family History  Problem Relation Age of Onset  . Cancer Mother   . Heart disease Father     heart attack     History  Substance Use Topics  . Smoking status: Former Games developer  . Smokeless tobacco: Not on file  . Alcohol Use: 0.0 oz/week    5-6 Cans of beer per week      Review of Systems  Constitutional: Negative.   HENT: Negative.   Respiratory: Positive for shortness of breath.   Cardiovascular: Positive for chest pain.  Gastrointestinal: Negative.   Musculoskeletal: Negative.   Skin: Negative.   Neurological: Negative.   Hematological: Negative.   Psychiatric/Behavioral: Negative.     Allergies  Penicillins and Sumatriptan  Home Medications   Current Outpatient Rx  Name Route Sig Dispense Refill  . LEVOCETIRIZINE DIHYDROCHLORIDE 5 MG PO TABS      . MOMETASONE FUROATE 50 MCG/ACT NA SUSP Nasal Place 2 sprays into the nose daily. 17 g 3  . OMEPRAZOLE 20 MG PO CPDR Oral Take 20 mg by mouth  daily.      Marland Kitchen ROSUVASTATIN CALCIUM 20 MG PO TABS Oral Take 20 mg by mouth daily.      . TRAMADOL HCL 50 MG PO TABS Oral Take 50 mg by mouth every 6 (six) hours as needed.        BP 149/73  Temp(Src) 98.8 F (37.1 C) (Oral)  Resp 18  SpO2 97%  Physical Exam  Nursing note and vitals reviewed. Constitutional: He appears well-developed and well-nourished.  HENT:  Head: Normocephalic and atraumatic.  Eyes: Conjunctivae are normal. Pupils are equal, round, and reactive to light.  Neck: Neck supple. No tracheal deviation present. No thyromegaly present.  Cardiovascular: Normal rate and regular rhythm.   No murmur heard. Pulmonary/Chest: Effort normal and breath sounds normal.  Abdominal: Soft. Bowel sounds are normal. He exhibits no distension. There is no tenderness.  Musculoskeletal: Normal range of motion. He exhibits no edema and no tenderness.  Neurological: He is alert. Coordination normal.  Skin: Skin is warm and dry. No rash noted.  Psychiatric: He has a normal mood and affect.    ED Course  Procedures (including critical care time)  Date: 10/10/2011  Rate: 95  Rhythm: normal sinus rhythm  QRS Axis: normal  Intervals: normal  ST/T Wave abnormalities: normal  Conduction Disutrbances:none  Narrative Interpretation:   Old EKG Reviewed: unchanged  no significant change from 08/28/2006  Labs Reviewed  POCT I-STAT TROPONIN I  I-STAT TROPONIN  I  I-STAT, CHEM 8  CBC  DIFFERENTIAL  I-STAT TROPONIN I   No results found. Results for orders placed during the hospital encounter of 10/10/11  CBC      Component Value Range   WBC 6.3  4.0 - 10.5 (K/uL)   RBC 4.43  4.22 - 5.81 (MIL/uL)   Hemoglobin 14.3  13.0 - 17.0 (g/dL)   HCT 45.4  09.8 - 11.9 (%)   MCV 95.3  78.0 - 100.0 (fL)   MCH 32.3  26.0 - 34.0 (pg)   MCHC 33.9  30.0 - 36.0 (g/dL)   RDW 14.7  82.9 - 56.2 (%)   Platelets 173  150 - 400 (K/uL)  DIFFERENTIAL      Component Value Range   Neutrophils Relative 62  43  - 77 (%)   Neutro Abs 3.9  1.7 - 7.7 (K/uL)   Lymphocytes Relative 28  12 - 46 (%)   Lymphs Abs 1.8  0.7 - 4.0 (K/uL)   Monocytes Relative 9  3 - 12 (%)   Monocytes Absolute 0.5  0.1 - 1.0 (K/uL)   Eosinophils Relative 1  0 - 5 (%)   Eosinophils Absolute 0.1  0.0 - 0.7 (K/uL)   Basophils Relative 0  0 - 1 (%)   Basophils Absolute 0.0  0.0 - 0.1 (K/uL)  POCT I-STAT TROPONIN I      Component Value Range   Troponin i, poc 0.00  0.00 - 0.08 (ng/mL)   Comment 3           POCT I-STAT, CHEM 8      Component Value Range   Sodium 143  135 - 145 (mEq/L)   Potassium 4.2  3.5 - 5.1 (mEq/L)   Chloride 105  96 - 112 (mEq/L)   BUN 13  6 - 23 (mg/dL)   Creatinine, Ser 1.30  0.50 - 1.35 (mg/dL)   Glucose, Bld 865 (*) 70 - 99 (mg/dL)   Calcium, Ion 7.84  6.96 - 1.32 (mmol/L)   TCO2 29  0 - 100 (mmol/L)   Hemoglobin 15.0  13.0 - 17.0 (g/dL)   HCT 29.5  28.4 - 13.2 (%)  D-DIMER, QUANTITATIVE      Component Value Range   D-Dimer, Quant 1.10 (*) 0.00 - 0.48 (ug/mL-FEU)   Ct Angio Chest W/cm &/or Wo Cm  10/10/2011  *RADIOLOGY REPORT*  Clinical Data:  Chest pain and shortness of breath.  CT ANGIOGRAPHY CHEST WITH CONTRAST  Technique:  Multidetector CT imaging of the chest was performed using the standard protocol during bolus administration of intravenous contrast.  Multiplanar CT image reconstructions including MIPs were obtained to evaluate the vascular anatomy.  Contrast: OMNIPAQUE IOHEXOL 300 MG/ML IV SOLN  Comparison:  None  Findings:  The pulmonary arteries are adequately opacified.  There is no evidence of pulmonary embolism.  Lungs show no evidence of edema, infiltrate or nodule.  No pleural or pericardial fluid.  The thoracic aorta is of normal caliber.  Scattered areas of parenchymal scarring and atelectasis noted in the lower lung zones bilaterally.  Bony structures are unremarkable.  Review of the MIP images confirms the above findings.  IMPRESSION: No evidence of pulmonary embolism or  other acute process.  Original Report Authenticated By: Reola Calkins, M.D.   Dg Chest Portable 1 View  10/10/2011  *RADIOLOGY REPORT*  Clinical Data: Chest pain, shortness of breath, former smoker  PORTABLE CHEST - 1 VIEW  Comparison: Portable exam 1837 hours compared to  12/11/2001  Findings: Normal heart size, mediastinal contours, and pulmonary vascularity for technique. Atherosclerotic calcification aortic arch. Lungs clear. No pleural effusion or pneumothorax. Bones unremarkable.  IMPRESSION: No acute abnormalities.  Original Report Authenticated By: Lollie Marrow, M.D.    No diagnosis found.  After 2 sublingual nitroglycerin patient states chest discomfort is almost gone however he is not certain that nitroglycerin made his pain better as it was improving on its own spontaneously. At 10:58 pM discomfort almost gone after having received morphine 4 mg IV MDM  Spoke with Dr.Le, evaluate patient in the emergency. Plan admit telemetery to rule out acute coronary syndrome Pulmonary embolism has been ruled out by CT angiogram of chest     Diagnosis: chest pain    Doug Sou, MD 10/10/11 2303

## 2011-10-10 NOTE — H&P (Signed)
PCP:   Marga Melnick, MD, MD   Chief Complaint:  Atypical chest pain   HPI: Robert [name] is an 65 y.o. male with history of GERD, anxiety, hyperlipidemia, history of migraine, but no known coronary disease, presents to [facility] with 2 episodes of substernal chest pain radiating toward his left arm. He stated his pain was severe, but only lasting about 10 minutes. He denied any diaphoresis and pain was not exertional.  He had mild headache with some nausea typical of his usual migraine.  He denied any fever, chills, and pain has not been pleuritic.  He admitted to overly exerted himself today blowing leaves off the roof of his house.  Generally he had no chest pain and has been very active. Evaluation in the emergency room included an EKG which showed no acute ST-T changes. His cardiac markers was negative. He also had a CT pulmonary angiogram which showed no pulmonary embolism. Because of increased cardiac risk factors, hospitalist was asked to admit the patient for cardiac rule out. Currently he is pain-free and comfortable.  His original heart rate was in the 130 and now it is 67 without any treatment.  Rewiew of Systems:  The patient denies anorexia, fever, weight loss,, vision loss, decreased hearing, hoarseness, , syncope, dyspnea on exertion, peripheral edema, balance deficits, hemoptysis, abdominal pain, melena, hematochezia, severe indigestion/heartburn, hematuria, incontinence, genital sores, muscle weakness, suspicious skin lesions, transient blindness, difficulty walking, depression, unusual weight change, abnormal bleeding, enlarged lymph nodes, angioedema, and breast masses.    Past Medical History  Diagnosis Date  . GERD (gastroesophageal reflux disease)   . Cancer   . Migraines     Past Surgical History  Procedure Date  . Back surgery   . Shoulder surgery   . Hand surgery     Medications:  HOME MEDS: Prior to Admission medications   Medication Sig Start Date End Date  Taking? Authorizing Provider  levocetirizine (XYZAL) 5 MG tablet  06/08/11   Historical Provider, MD  mometasone (NASONEX) 50 MCG/ACT nasal spray Place 2 sprays into the nose daily. 09/23/11 09/22/12  Neena Rhymes, MD  omeprazole (PRILOSEC) 20 MG capsule Take 20 mg by mouth daily.      Historical Provider, MD  rosuvastatin (CRESTOR) 20 MG tablet Take 20 mg by mouth daily.      Historical Provider, MD  traMADol (ULTRAM) 50 MG tablet Take 50 mg by mouth every 6 (six) hours as needed.      Historical Provider, MD     Allergies:  Allergies  Allergen Reactions  . Penicillins   . Sumatriptan     REACTION: sob, ha, scalp sensitivity    Social History:   reports that he has quit smoking. He does not have any smokeless tobacco history on file. He reports that he drinks alcohol. He reports that he does not use illicit drugs.  Family History: Family History  Problem Relation Age of Onset  . Cancer Mother   . Heart disease Father     heart attack      Physical Exam: Filed Vitals:   10/10/11 1856 10/10/11 1930 10/10/11 2001 10/10/11 2255  BP: 167/83 141/63 159/71 142/64  Pulse: 57 126 126 75  Temp:      TempSrc:      Resp: 17 12 14 18   SpO2: 99% 99% 99% 100%   Blood pressure 142/64, pulse 75, temperature 98.8 F (37.1 C), temperature source Oral, resp. rate 18, SpO2 100.00%.  GEN:  Pleasant  person lying in the stretcher in no acute distress; cooperative with exam PSYCH:  alert and oriented x4; does not appear anxious does not appear depressed; affect is normal HEENT: Mucous membranes pink and anicteric; PERRLA; EOM intact; no cervical lymphadenopathy nor thyromegaly or carotid bruit; no JVD; Breasts:: Not examined CHEST WALL: No tenderness CHEST: Normal respiration, clear to auscultation bilaterally HEART: Regular rate and rhythm; no murmurs rubs or gallops BACK: No kyphosis or scoliosis; no CVA tenderness ABDOMEN: Obese, soft non-tender; no masses, no organomegaly, normal  abdominal bowel sounds; no pannus; no intertriginous candida. Rectal Exam: Not done EXTREMITIES: No bone or joint deformity; age-appropriate arthropathy of the hands and knees; no edema; no ulcerations. Genitalia: not examined PULSES: 2+ and symmetric SKIN: Normal hydration no rash or ulceration CNS: Cranial nerves 2-12 grossly intact no focal neurologic deficit   Labs & Imaging Results for orders placed during the hospital encounter of 10/10/11 (from the past 48 hour(s))  CBC     Status: Normal   Collection Time   10/10/11  6:32 PM      Component Value Range Comment   WBC 6.3  4.0 - 10.5 (K/uL)    RBC 4.43  4.22 - 5.81 (MIL/uL)    Hemoglobin 14.3  13.0 - 17.0 (g/dL)    HCT 16.1  09.6 - 04.5 (%)    MCV 95.3  78.0 - 100.0 (fL)    MCH 32.3  26.0 - 34.0 (pg)    MCHC 33.9  30.0 - 36.0 (g/dL)    RDW 40.9  81.1 - 91.4 (%)    Platelets 173  150 - 400 (K/uL)   DIFFERENTIAL     Status: Normal   Collection Time   10/10/11  6:32 PM      Component Value Range Comment   Neutrophils Relative 62  43 - 77 (%)    Neutro Abs 3.9  1.7 - 7.7 (K/uL)    Lymphocytes Relative 28  12 - 46 (%)    Lymphs Abs 1.8  0.7 - 4.0 (K/uL)    Monocytes Relative 9  3 - 12 (%)    Monocytes Absolute 0.5  0.1 - 1.0 (K/uL)    Eosinophils Relative 1  0 - 5 (%)    Eosinophils Absolute 0.1  0.0 - 0.7 (K/uL)    Basophils Relative 0  0 - 1 (%)    Basophils Absolute 0.0  0.0 - 0.1 (K/uL)   POCT I-STAT TROPONIN I     Status: Normal   Collection Time   10/10/11  6:34 PM      Component Value Range Comment   Troponin i, poc 0.00  0.00 - 0.08 (ng/mL)    Comment 3            POCT I-STAT, CHEM 8     Status: Abnormal   Collection Time   10/10/11  6:50 PM      Component Value Range Comment   Sodium 143  135 - 145 (mEq/L)    Potassium 4.2  3.5 - 5.1 (mEq/L)    Chloride 105  96 - 112 (mEq/L)    BUN 13  6 - 23 (mg/dL)    Creatinine, Ser 7.82  0.50 - 1.35 (mg/dL)    Glucose, Bld 956 (*) 70 - 99 (mg/dL)    Calcium, Ion 2.13   1.12 - 1.32 (mmol/L)    TCO2 29  0 - 100 (mmol/L)    Hemoglobin 15.0  13.0 - 17.0 (g/dL)    HCT  44.0  39.0 - 52.0 (%)   D-DIMER, QUANTITATIVE     Status: Abnormal   Collection Time   10/10/11  7:25 PM      Component Value Range Comment   D-Dimer, Quant 1.10 (*) 0.00 - 0.48 (ug/mL-FEU)    Ct Angio Chest W/cm &/or Wo Cm  10/10/2011  *RADIOLOGY REPORT*  Clinical Data:  Chest pain and shortness of breath.  CT ANGIOGRAPHY CHEST WITH CONTRAST  Technique:  Multidetector CT imaging of the chest was performed using the standard protocol during bolus administration of intravenous contrast.  Multiplanar CT image reconstructions including MIPs were obtained to evaluate the vascular anatomy.  Contrast: OMNIPAQUE IOHEXOL 300 MG/ML IV SOLN  Comparison:  None  Findings:  The pulmonary arteries are adequately opacified.  There is no evidence of pulmonary embolism.  Lungs show no evidence of edema, infiltrate or nodule.  No pleural or pericardial fluid.  The thoracic aorta is of normal caliber.  Scattered areas of parenchymal scarring and atelectasis noted in the lower lung zones bilaterally.  Bony structures are unremarkable.  Review of the MIP images confirms the above findings.  IMPRESSION: No evidence of pulmonary embolism or other acute process.  Original Report Authenticated By: Reola Calkins, M.D.   Dg Chest Portable 1 View  10/10/2011  *RADIOLOGY REPORT*  Clinical Data: Chest pain, shortness of breath, former smoker  PORTABLE CHEST - 1 VIEW  Comparison: Portable exam 1837 hours compared to 12/11/2001  Findings: Normal heart size, mediastinal contours, and pulmonary vascularity for technique. Atherosclerotic calcification aortic arch. Lungs clear. No pleural effusion or pneumothorax. Bones unremarkable.  IMPRESSION: No acute abnormalities.  Original Report Authenticated By: Lollie Marrow, M.D.      Assessment Present on Admission:  .GERD .HYPERGLYCEMIA, FASTING .SLEEP APNEA   PLAN:  Mr.  Arment presents with atypical chest pain and with increased cardiac risk factors.  I do not think that his pain today is cardiac in its etiology.  This pain only lasted 10 minutes, with normal EKG and negative cardiac markers.  Nevertheless, to be prudent, we'll admit him for rule out  with serial CPKs and troponins.  I will place a nitro paste to his chest wall and give him full dose aspirin. We'll continue his Crestor. I would like to get an echo to evaluate any wall motion abnormality.  He has history of GERD and it is possible that this is esophageal spasm. I will put him on Nexium at 40 mg one tablet by mouth daily.  Will continue his CPAP machine for his sleep apnea. For his diabetes, we'll put him on sensitive insulin sliding scale and a modified carbohydrate diet. Behavior modification to lower his cardiac event discussed as well.  He does have elevated blood pressure and I will start him on lisinopril 10 mg per day.   Other plans as per orders.    Constanza Mincy 10/10/2011, 11:50 PM

## 2011-10-10 NOTE — ED Notes (Signed)
MD at bedside. 

## 2011-10-11 ENCOUNTER — Encounter (HOSPITAL_COMMUNITY): Payer: Self-pay | Admitting: Family Medicine

## 2011-10-11 DIAGNOSIS — I152 Hypertension secondary to endocrine disorders: Secondary | ICD-10-CM | POA: Diagnosis present

## 2011-10-11 DIAGNOSIS — E1159 Type 2 diabetes mellitus with other circulatory complications: Secondary | ICD-10-CM | POA: Diagnosis present

## 2011-10-11 DIAGNOSIS — R079 Chest pain, unspecified: Secondary | ICD-10-CM | POA: Diagnosis present

## 2011-10-11 LAB — GLUCOSE, CAPILLARY
Glucose-Capillary: 92 mg/dL (ref 70–99)
Glucose-Capillary: 79 mg/dL (ref 70–99)
Glucose-Capillary: 130 mg/dL — ABNORMAL HIGH (ref 70–99)
Glucose-Capillary: 112 mg/dL — ABNORMAL HIGH (ref 70–99)

## 2011-10-11 LAB — CARDIAC PANEL(CRET KIN+CKTOT+MB+TROPI)
Relative Index: 3 — ABNORMAL HIGH (ref 0.0–2.5)
Total CK: 152 U/L (ref 7–232)
Troponin I: 0.47 ng/mL (ref <0.30)
Troponin I: 0.34 ng/mL (ref <0.30)

## 2011-10-11 LAB — CBC
Platelets: 170 10*3/uL (ref 150–400)
RBC: 4.28 MIL/uL (ref 4.22–5.81)
WBC: 5.5 10*3/uL (ref 4.0–10.5)

## 2011-10-11 LAB — HEMOGLOBIN A1C
Hgb A1c MFr Bld: 6 % — ABNORMAL HIGH (ref <5.7)
Mean Plasma Glucose: 126 mg/dL — ABNORMAL HIGH (ref <117)

## 2011-10-11 MED ORDER — LORATADINE 10 MG PO TABS
10.0000 mg | ORAL_TABLET | Freq: Every evening | ORAL | Status: DC
  Administered 2011-10-11 – 2011-10-12 (×2): 10 mg via ORAL
  Filled 2011-10-11 (×5): qty 1

## 2011-10-11 MED ORDER — FLUTICASONE PROPIONATE 50 MCG/ACT NA SUSP
1.0000 | Freq: Every day | NASAL | Status: DC
  Administered 2011-10-12: 1 via NASAL
  Filled 2011-10-11: qty 16

## 2011-10-11 MED ORDER — LISINOPRIL 10 MG PO TABS
10.0000 mg | ORAL_TABLET | Freq: Every day | ORAL | Status: DC
  Administered 2011-10-11: 10 mg via ORAL
  Filled 2011-10-11 (×2): qty 1

## 2011-10-11 MED ORDER — ALUM & MAG HYDROXIDE-SIMETH 200-200-20 MG/5ML PO SUSP
30.0000 mL | Freq: Four times a day (QID) | ORAL | Status: DC | PRN
Start: 2011-10-11 — End: 2011-10-14

## 2011-10-11 MED ORDER — ZOLPIDEM TARTRATE 5 MG PO TABS: 10.0000 mg | ORAL_TABLET | Freq: Every evening | ORAL | Status: DC | PRN

## 2011-10-11 MED ORDER — SODIUM CHLORIDE 0.9 % IJ SOLN
3.0000 mL | Freq: Two times a day (BID) | INTRAMUSCULAR | Status: DC
  Administered 2011-10-11 – 2011-10-12 (×4): 3 mL via INTRAVENOUS

## 2011-10-11 MED ORDER — ROSUVASTATIN CALCIUM 20 MG PO TABS
20.0000 mg | ORAL_TABLET | Freq: Every evening | ORAL | Status: DC
  Administered 2011-10-11 – 2011-10-12 (×2): 20 mg via ORAL
  Filled 2011-10-11 (×3): qty 1

## 2011-10-11 MED ORDER — MORPHINE SULFATE 2 MG/ML IJ SOLN
2.0000 mg | INTRAMUSCULAR | Status: DC | PRN
  Administered 2011-10-11 – 2011-10-13 (×2): 2 mg via INTRAVENOUS
  Filled 2011-10-11 (×2): qty 1

## 2011-10-11 MED ORDER — ONDANSETRON HCL 4 MG/2ML IJ SOLN: 4.0000 mg | Freq: Four times a day (QID) | INTRAMUSCULAR | Status: DC | PRN

## 2011-10-11 MED ORDER — PANTOPRAZOLE SODIUM 40 MG PO TBEC
80.0000 mg | DELAYED_RELEASE_TABLET | Freq: Every day | ORAL | Status: DC
  Administered 2011-10-11 – 2011-10-12 (×2): 80 mg via ORAL
  Filled 2011-10-11 (×4): qty 2

## 2011-10-11 MED ORDER — ENOXAPARIN SODIUM 40 MG/0.4ML ~~LOC~~ SOLN
40.0000 mg | SUBCUTANEOUS | Status: DC
  Administered 2011-10-11 – 2011-10-13 (×3): 40 mg via SUBCUTANEOUS
  Filled 2011-10-11 (×4): qty 0.4

## 2011-10-11 MED ORDER — SODIUM CHLORIDE 0.9 % IJ SOLN: 3.0000 mL | INTRAMUSCULAR | Status: DC | PRN

## 2011-10-11 MED ORDER — SODIUM CHLORIDE 0.9 % IV SOLN: 250.0000 mL | INTRAVENOUS | Status: DC | PRN

## 2011-10-11 MED ORDER — ASPIRIN 81 MG PO CHEW: 81.0000 mg | CHEWABLE_TABLET | Freq: Every day | ORAL | Status: DC

## 2011-10-11 MED ORDER — TRAMADOL HCL 50 MG PO TABS
50.0000 mg | ORAL_TABLET | Freq: Four times a day (QID) | ORAL | Status: DC | PRN
  Administered 2011-10-11 – 2011-10-13 (×4): 50 mg via ORAL
  Filled 2011-10-11 (×5): qty 1

## 2011-10-11 MED ORDER — ONDANSETRON HCL 4 MG PO TABS: 4.0000 mg | ORAL_TABLET | Freq: Four times a day (QID) | ORAL | Status: DC | PRN

## 2011-10-11 MED ORDER — ASPIRIN EC 325 MG PO TBEC
325.0000 mg | DELAYED_RELEASE_TABLET | Freq: Every day | ORAL | Status: DC
  Administered 2011-10-11 – 2011-10-14 (×3): 325 mg via ORAL
  Filled 2011-10-11 (×4): qty 1

## 2011-10-11 MED ORDER — METOPROLOL TARTRATE 12.5 MG HALF TABLET
12.5000 mg | ORAL_TABLET | Freq: Two times a day (BID) | ORAL | Status: DC
  Administered 2011-10-11 – 2011-10-14 (×5): 12.5 mg via ORAL
  Filled 2011-10-11 (×8): qty 1

## 2011-10-11 MED ORDER — INSULIN ASPART 100 UNIT/ML ~~LOC~~ SOLN
0.0000 [IU] | Freq: Three times a day (TID) | SUBCUTANEOUS | Status: DC
  Administered 2011-10-11 – 2011-10-12 (×3): 1 [IU] via SUBCUTANEOUS
  Filled 2011-10-11 (×3): qty 3
  Filled 2011-10-11: qty 1
  Filled 2011-10-11 (×4): qty 3

## 2011-10-11 MED ORDER — LEVOCETIRIZINE DIHYDROCHLORIDE 5 MG PO TABS: 5.0000 mg | ORAL_TABLET | Freq: Every evening | ORAL | Status: DC

## 2011-10-11 NOTE — Progress Notes (Signed)
UR CHART REVIEWED 

## 2011-10-11 NOTE — Progress Notes (Signed)
*  PRELIMINARY RESULTS* Echocardiogram 2D Echocardiogram has been performed.  Robert [name] 10/11/2011, 2:53 PM 

## 2011-10-11 NOTE — ED Notes (Signed)
Dr. Britta Mccreedy aware of troponin of 0.47.

## 2011-10-11 NOTE — ED Notes (Signed)
Respiratory called to place patient on CPAP

## 2011-10-11 NOTE — Progress Notes (Signed)
Interval History: Robert [name] is a 65 year old male who was admitted on 10/10/11 with atypical chest pain.  His first set of cardiac markers showed mild troponin elevation.  He is on appropriate therapy with nitropaste, ASA, and a statin. ROS: No current chest pain but had some through the night.  No dyspnea.  C/O headache.   Objective: Vital signs in last 24 hours: Temp:  [97.5 F (36.4 C)-98.8 F (37.1 C)] 98.3 F (36.8 C) (11/27 1554) Pulse Rate:  [57-126] 64  (11/27 1554) Resp:  [0-20] 20  (11/27 1554) BP: (105-167)/(52-83) 146/74 mmHg (11/27 1554) SpO2:  [95 %-100 %] 95 % (11/27 1554) Weight:  [93.895 kg (207 lb)] 207 lb (93.895 kg) (11/27 1554) Weight change:  Last BM Date: 10/10/11  Intake/Output from previous day: No intake or output data in the 24 hours ending 10/11/11 1706   Physical Exam:  Gen:  NAD Cardiovascular:  RRR, No M/R/G Respiratory: Lungs CTAB Gastrointestinal: Abdomen soft, NT/ND with normal active bowel sounds. Extremities: No C/E/C   Lab Results: Basic Metabolic Panel:  Lab 10/10/11 2440  NA 143  K 4.2  CL 105  CO2 --  GLUCOSE 106*  BUN 13  CREATININE 1.10  CALCIUM --  MG --  PHOS --   CBC:  Lab 10/11/11 1202 10/10/11 1850 10/10/11 1832  WBC 5.5 -- 6.3  NEUTROABS -- -- 3.9  HGB 13.7 15.0 14.3  HCT 41.0 44.0 42.2  MCV 95.8 -- 95.3  PLT 170 -- 173   Cardiac Enzymes:  Lab 10/11/11 1611 10/11/11 0825  CKTOTAL 152 164  CKMB 4.0 5.0*  CKMBINDEX -- --  TROPONINI PENDING 0.47*   BNP: No results found for this basename: POCBNP:5 in the last 168 hours CBG:  Lab 10/11/11 1236 10/11/11 1103 10/11/11 0742  GLUCAP 79 92 123*   D-Dimer  Basename 10/10/11 1925  DDIMER 1.10*   Hgb A1c  Basename 10/10/11 1832  HGBA1C 6.0*   Thyroid function studies  Basename 10/11/11 0825  TSH 2.572  T4TOTAL --  T3FREE --  THYROIDAB --    Studies/Results: Ct Angio Chest W/cm &/or Wo Cm  10/10/2011  *RADIOLOGY REPORT*  Clinical Data:   Chest pain and shortness of breath.  CT ANGIOGRAPHY CHEST WITH CONTRAST  Technique:  Multidetector CT imaging of the chest was performed using the standard protocol during bolus administration of intravenous contrast.  Multiplanar CT image reconstructions including MIPs were obtained to evaluate the vascular anatomy.  Contrast: OMNIPAQUE IOHEXOL 300 MG/ML IV SOLN  Comparison:  None  Findings:  The pulmonary arteries are adequately opacified.  There is no evidence of pulmonary embolism.  Lungs show no evidence of edema, infiltrate or nodule.  No pleural or pericardial fluid.  The thoracic aorta is of normal caliber.  Scattered areas of parenchymal scarring and atelectasis noted in the lower lung zones bilaterally.  Bony structures are unremarkable.  Review of the MIP images confirms the above findings.  IMPRESSION: No evidence of pulmonary embolism or other acute process.  Original Report Authenticated By: Reola Calkins, M.D.   Dg Chest Portable 1 View  10/10/2011  *RADIOLOGY REPORT*  Clinical Data: Chest pain, shortness of breath, former smoker  PORTABLE CHEST - 1 VIEW  Comparison: Portable exam 1837 hours compared to 12/11/2001  Findings: Normal heart size, mediastinal contours, and pulmonary vascularity for technique. Atherosclerotic calcification aortic arch. Lungs clear. No pleural effusion or pneumothorax. Bones unremarkable.  IMPRESSION: No acute abnormalities.  Original Report Authenticated By:  Lollie Marrow, M.D.    Medications: Scheduled Meds:   . aspirin  162 mg Oral Once  . aspirin EC  325 mg Oral Daily  . enoxaparin  40 mg Subcutaneous Q24H  . fluticasone  1 spray Each Nare Daily  . insulin aspart  0-9 Units Subcutaneous TID WC  . lisinopril  10 mg Oral Daily  . loratadine  10 mg Oral QPM  .  morphine injection  4 mg Intravenous Once  . nitroGLYCERIN  1 inch Topical Once  . pantoprazole  80 mg Oral Q1200  . rosuvastatin  20 mg Oral QPM  . sodium chloride  3 mL Intravenous  Q12H  . DISCONTD: aspirin  81 mg Oral Daily  . DISCONTD: levocetirizine  5 mg Oral QPM   Continuous Infusions:   . DISCONTD: sodium chloride 20 mL/hr at 10/10/11 1854   PRN Meds:.sodium chloride, alum & mag hydroxide-simeth, iohexol, morphine, nitroGLYCERIN, ondansetron (ZOFRAN) IV, ondansetron, sodium chloride, zolpidem  Assessment/Plan: Principal Problem:  *Chest pain Given his risk factor profile and mildly elevated troponins, will need a formal cardiology evaluation.  No regional wall motion abnormalities noted on Echo.  12 lead EKG was non-acute.  Will check a FLP to ensure his lipids are under good control.  Will continue ASA and nitropaste. Active Problems:  HYPERLIPIDEMIA Check a FLP to ensure lipids are well controlled.  Continue Crestor.  GERD Continue PPI  HYPERGLYCEMIA, FASTING The patient does have an elevated hgb A1c.  Will get dietician consult.  SLEEP APNEA Continue home CPAP. HTN D/C lisinopril and start beta blocker.    LOS: 1 day   Robert Aldo, MD Pager [phone]  10/11/2011, 5:06 PM

## 2011-10-11 NOTE — Discharge Planning (Signed)
Paged Dr Darnelle Catalan for clarification of admission status

## 2011-10-12 ENCOUNTER — Other Ambulatory Visit: Payer: Self-pay

## 2011-10-12 DIAGNOSIS — R079 Chest pain, unspecified: Secondary | ICD-10-CM

## 2011-10-12 LAB — BASIC METABOLIC PANEL
Calcium: 9.6 mg/dL (ref 8.4–10.5)
Chloride: 102 mEq/L (ref 96–112)
Creatinine, Ser: 0.88 mg/dL (ref 0.50–1.35)
GFR calc Af Amer: >90 mL/min (ref >90)
Sodium: 137 mEq/L (ref 135–145)

## 2011-10-12 LAB — CARDIAC PANEL(CRET KIN+CKTOT+MB+TROPI): Troponin I: <0.3 ng/mL (ref <0.30)

## 2011-10-12 LAB — CBC
HCT: 39.9 % (ref 39.0–52.0)
Platelets: 158 10*3/uL (ref 150–400)
RDW: 13.4 % (ref 11.5–15.5)
WBC: 5.7 10*3/uL (ref 4.0–10.5)

## 2011-10-12 LAB — GLUCOSE, CAPILLARY: Glucose-Capillary: 103 mg/dL — ABNORMAL HIGH (ref 70–99)

## 2011-10-12 LAB — LIPID PANEL
LDL Cholesterol: 126 mg/dL — ABNORMAL HIGH (ref 0–99)
Total CHOL/HDL Ratio: 3.7 RATIO

## 2011-10-12 MED ORDER — ACETAMINOPHEN 325 MG PO TABS: 650.0000 mg | ORAL_TABLET | ORAL | Status: DC | PRN

## 2011-10-12 MED ORDER — ONDANSETRON HCL 4 MG/2ML IJ SOLN: 4.0000 mg | Freq: Four times a day (QID) | INTRAMUSCULAR | Status: DC | PRN

## 2011-10-12 NOTE — Progress Notes (Signed)
Pt. Seen and examined.  I agree with the note and plan of care as outlined by Aurelio Brash, NP.  RAMA,CHRISTINA 10/12/2011 2:18 PM

## 2011-10-12 NOTE — Progress Notes (Signed)
Subjective: "No chest pain in last 24 hours" No complaints  Objective: Vital signs Filed Vitals:   10/11/11 1554 10/11/11 2105 10/12/11 0513 10/12/11 0904  BP: 146/74 134/69 117/70 120/60  Pulse: 64 62 56   Temp: 98.3 F (36.8 C) 97.6 F (36.4 C) 97.6 F (36.4 C)   TempSrc: Oral Oral Oral   Resp: 20 20 20    Height: 6\' 1"  (1.854 m)     Weight: 93.895 kg (207 lb)  93.4 kg (205 lb 14.6 oz)   SpO2: 95% 96% 98%    Weight change:  Last BM Date: 10/10/11  Intake/Output from previous day: 11/27 0701 - 11/28 0700 In: 240 [P.O.:240] Out: 850 [Urine:850] Total I/O In: 243 [P.O.:240; I.V.:3] Out: -    Physical Exam: General: Alert, awake, oriented x3, in no acute distress. HEENT: No bruits, no goiter. PERRL Mucus membrane mouth moist/pink Heart: Regular rate and rhythm, without murmurs, rubs, gallops. Lungs:Normal effort. Breath sounds Clear to auscultation bilaterally. No wheeze, rhonchi Abdomen: Soft, nontender, nondistended, positive bowel sounds. Extremities: No clubbing cyanosis or edema with positive pedal pulses. Neuro: Grossly intact, nonfocal. Cranial nerve II-XII intact    Lab Results: Basic Metabolic Panel:  Basename 10/12/11 0014 10/10/11 1850  NA 137 143  K 3.9 4.2  CL 102 105  CO2 27 --  GLUCOSE 118* 106*  BUN 11 13  CREATININE 0.88 1.10  CALCIUM 9.6 --  MG -- --  PHOS -- --   Liver Function Tests: No results found for this basename: AST:2,ALT:2,ALKPHOS:2,BILITOT:2,PROT:2,ALBUMIN:2 in the last 72 hours No results found for this basename: LIPASE:2,AMYLASE:2 in the last 72 hours No results found for this basename: AMMONIA:2 in the last 72 hours CBC:  Basename 10/12/11 0014 10/11/11 1202 10/10/11 1832  WBC 5.7 5.5 --  NEUTROABS -- -- 3.9  HGB 13.3 13.7 --  HCT 39.9 41.0 --  MCV 95.7 95.8 --  PLT 158 170 --   Cardiac Enzymes:  Basename 10/12/11 0014 10/11/11 1611 10/11/11 0825  CKTOTAL 132 152 164  CKMB 3.5 4.0 5.0*  CKMBINDEX -- -- --    TROPONINI <0.30 0.34* 0.47*   BNP: No results found for this basename: POCBNP:3 in the last 72 hours D-Dimer:  [facility] 10/10/11 1925  DDIMER 1.10*   CBG:  Basename 10/12/11 0657 10/11/11 2122 10/11/11 1701 10/11/11 1433 10/11/11 1236 10/11/11 1103  GLUCAP 97 112* 130* 138* 79 92   Hemoglobin A1C:  Basename 10/10/11 1832  HGBA1C 6.0*   Fasting Lipid Panel:  Basename 10/12/11 0505  CHOL 222*  HDL 60  LDLCALC 126*  TRIG 181*  CHOLHDL 3.7  LDLDIRECT --   Thyroid Function Tests:  Basename 10/11/11 0825  TSH 2.572  T4TOTAL --  FREET4 --  T3FREE --  THYROIDAB --   Anemia Panel: No results found for this basename: VITAMINB12,FOLATE,FERRITIN,TIBC,IRON,RETICCTPCT in the last 72 hours Coagulation: No results found for this basename: LABPROT:2,INR:2 in the last 72 hours Urine Drug Screen: Drugs of Abuse  No results found for this basename: labopia, cocainscrnur, labbenz, amphetmu, thcu, labbarb    Alcohol Level: No results found for this basename: ETH:2 in the last 72 hours Urinalysis:  Misc. Labs:  No results found for this or any previous visit (from the past 240 hour(s)).  Studies/Results: Ct Angio Chest W/cm &/or Wo Cm  10/10/2011  *RADIOLOGY REPORT*  Clinical Data:  Chest pain and shortness of breath.  CT ANGIOGRAPHY CHEST WITH CONTRAST  Technique:  Multidetector CT imaging of the chest was performed using  the standard protocol during bolus administration of intravenous contrast.  Multiplanar CT image reconstructions including MIPs were obtained to evaluate the vascular anatomy.  Contrast: OMNIPAQUE IOHEXOL 300 MG/ML IV SOLN  Comparison:  None  Findings:  The pulmonary arteries are adequately opacified.  There is no evidence of pulmonary embolism.  Lungs show no evidence of edema, infiltrate or nodule.  No pleural or pericardial fluid.  The thoracic aorta is of normal caliber.  Scattered areas of parenchymal scarring and atelectasis noted in the lower lung  zones bilaterally.  Bony structures are unremarkable.  Review of the MIP images confirms the above findings.  IMPRESSION: No evidence of pulmonary embolism or other acute process.  Original Report Authenticated By: Reola Calkins, M.D.   Dg Chest Portable 1 View  10/10/2011  *RADIOLOGY REPORT*  Clinical Data: Chest pain, shortness of breath, former smoker  PORTABLE CHEST - 1 VIEW  Comparison: Portable exam 1837 hours compared to 12/11/2001  Findings: Normal heart size, mediastinal contours, and pulmonary vascularity for technique. Atherosclerotic calcification aortic arch. Lungs clear. No pleural effusion or pneumothorax. Bones unremarkable.  IMPRESSION: No acute abnormalities.  Original Report Authenticated By: Lollie Marrow, M.D.    Medications: Scheduled Meds:   . aspirin EC  325 mg Oral Daily  . enoxaparin  40 mg Subcutaneous Q24H  . fluticasone  1 spray Each Nare Daily  . insulin aspart  0-9 Units Subcutaneous TID WC  . loratadine  10 mg Oral QPM  . metoprolol tartrate  12.5 mg Oral BID  . pantoprazole  80 mg Oral Q1200  . rosuvastatin  20 mg Oral QPM  . sodium chloride  3 mL Intravenous Q12H  . DISCONTD: lisinopril  10 mg Oral Daily   Continuous Infusions:  PRN Meds:.sodium chloride, acetaminophen, alum & mag hydroxide-simeth, morphine, nitroGLYCERIN, ondansetron (ZOFRAN) IV, ondansetron, sodium chloride, traMADol, zolpidem, DISCONTD: ondansetron (ZOFRAN) IV  Assessment/Plan:  Principal Problem: 1. *Chest pain : no pain in last 24 hours. No SOB/Headache. Dr. Myrtis Ser in to see. Appreciate assistance. Pt to have cath either today or 11/30. Continue ASA and nitropaste Active Problems: 2. HYPERLIPIDEMIA: Not great control. Currently on crestor. May need to increase dose 3. GERD: baseline continue PPi 4. SLEEP APNEA: Continue home cpap 5 HTN (hypertension): continue BB    LOS: 2 days   [facility] M 10/12/2011, 1:18 PM

## 2011-10-12 NOTE — Consult Note (Signed)
CARDIOLOGY CONSULT NOTE  Patient ID: Robert [name] MRN: [id number] DOB/AGE: August 12, 1946 65 y.o.  Admit date: 10/10/2011 Referring Physician Primary PhysicianWilliam Alwyn Ren, MD, MD Primary Cardiologist   New, Dr. Myrtis Ser Reason for Consultation  chest pain Principal Problem:  *Chest pain Active Problems:  HYPERLIPIDEMIA  GERD  SLEEP APNEA  HYPERGLYCEMIA, FASTING  HTN (hypertension)  HPI:    This pleasant gentleman is very active. He works in his yard a great deal and has no significant symptoms. He tells me today that he did undergo cardiac catheterization 15 years ago with no marked abnormalities. He has had significant hypercholesterolemia over the years.  On the day of admission he had been quite active feeling well he then developed marked pressure sensation in his chest. He had never had this before and it was quite concerning. This radiated to his left arm. He had acute shortness of breath. By the time he arrived at the emergency room he was feeling better. There was sinus tachycardia which improved. The EKG revealed no diagnostic change. There has not been a repeat EKG done since. The first troponin was normal. Take followup troponins revealed 0.43,  0.34 and then normal. He has been stable since admission.  Review of systems:    Patient denies fever, chills, headache, sweats, rash, change in vision, change in hearing, chest pain, cough, nausea vomiting, urinary symptoms. All other systems are reviewed and are negative.  Past Medical History  Diagnosis Date  . GERD (gastroesophageal reflux disease)   . Cancer   . Migraines     Family History  Problem Relation Age of Onset  . Cancer Mother   . Heart disease Father     heart attack     History   Social History  . Marital Status: Married    Spouse Name: N/A    Number of Children: N/A  . Years of Education: N/A   Occupational History  . Not on file.   Social History Main Topics  . Smoking status: Former Smoker   Types: Cigarettes  . Smokeless tobacco: Never Used  . Alcohol Use: 0.0 oz/week    5-6 Cans of beer per week  . Drug Use: No  . Sexually Active: No   Other Topics Concern  . Not on file   Social History Narrative  . No narrative on file    Past Surgical History  Procedure Date  . Back surgery   . Shoulder surgery   . Hand surgery      Prescriptions prior to admission  Medication Sig Dispense Refill  . levocetirizine (XYZAL) 5 MG tablet       . mometasone (NASONEX) 50 MCG/ACT nasal spray Place 2 sprays into the nose daily.  17 g  3  . omeprazole (PRILOSEC) 20 MG capsule Take 20 mg by mouth daily.        . rosuvastatin (CRESTOR) 20 MG tablet Take 20 mg by mouth daily.        . traMADol (ULTRAM) 50 MG tablet Take 50 mg by mouth every 6 (six) hours as needed.          Physical Exam:   Patient is quite stable. He is oriented to person time and place. Affect is normal. Head is atraumatic. There is no xanthelasma. There is no jugulovenous distention. There no carotid bruits. Lungs are clear. Respiratory effort is nonlabored. Cardiac exam reveals S1 and S2. There are no clicks or significant murmurs. The abdomen is soft. There is no peripheral edema.  There are no musculoskeletal deformities. There are no skin rashes.    Labs:   Lab Results  Component Value Date   WBC 5.7 10/12/2011   HGB 13.3 10/12/2011   HCT 39.9 10/12/2011   MCV 95.7 10/12/2011   PLT 158 10/12/2011    Lab 10/12/11 0014  NA 137  K 3.9  CL 102  CO2 27  BUN 11  CREATININE 0.88  CALCIUM 9.6  PROT --  BILITOT --  ALKPHOS --  ALT --  AST --  GLUCOSE 118*   Lab Results  Component Value Date   CKTOTAL 132 10/12/2011   CKMB 3.5 10/12/2011   TROPONINI <0.30 10/12/2011    Lab Results  Component Value Date   CHOL 222* 10/12/2011   CHOL 211* 03/31/2010   CHOL 185 02/24/2009   Lab Results  Component Value Date   HDL 60 10/12/2011   HDL 52.70 03/31/2010   HDL 09.38 02/24/2009   Lab Results  Component  Value Date   LDLCALC 126* 10/12/2011   LDLCALC 109* 02/24/2009   LDLCALC 115* 08/19/2008   Lab Results  Component Value Date   TRIG 181* 10/12/2011   TRIG 218.0* 03/31/2010   TRIG 146.0 02/24/2009   Lab Results  Component Value Date   CHOLHDL 3.7 10/12/2011   CHOLHDL 4 03/31/2010   CHOLHDL 4 02/24/2009   Lab Results  Component Value Date   LDLDIRECT 137.0 03/31/2010   LDLDIRECT 183.0 04/11/2008      Radiology: Ct Angio Chest W/cm &/or Wo Cm  10/10/2011  *RADIOLOGY REPORT*  Clinical Data:  Chest pain and shortness of breath.  CT ANGIOGRAPHY CHEST WITH CONTRAST  Technique:  Multidetector CT imaging of the chest was performed using the standard protocol during bolus administration of intravenous contrast.  Multiplanar CT image reconstructions including MIPs were obtained to evaluate the vascular anatomy.  Contrast: OMNIPAQUE IOHEXOL 300 MG/ML IV SOLN  Comparison:  None  Findings:  The pulmonary arteries are adequately opacified.  There is no evidence of pulmonary embolism.  Lungs show no evidence of edema, infiltrate or nodule.  No pleural or pericardial fluid.  The thoracic aorta is of normal caliber.  Scattered areas of parenchymal scarring and atelectasis noted in the lower lung zones bilaterally.  Bony structures are unremarkable.  Review of the MIP images confirms the above findings.  IMPRESSION: No evidence of pulmonary embolism or other acute process.  Original Report Authenticated By: Reola Calkins, M.D.   Dg Chest Portable 1 View  10/10/2011  *RADIOLOGY REPORT*  Clinical Data: Chest pain, shortness of breath, former smoker  PORTABLE CHEST - 1 VIEW  Comparison: Portable exam 1837 hours compared to 12/11/2001  Findings: Normal heart size, mediastinal contours, and pulmonary vascularity for technique. Atherosclerotic calcification aortic arch. Lungs clear. No pleural effusion or pneumothorax. Bones unremarkable.  IMPRESSION: No acute abnormalities.  Original Report Authenticated  By: Lollie Marrow, M.D.   EKG:  The initial EKG revealed sinus tachycardia with no significant changes. Repeat EKG will be ordered.  ASSESSMENT AND PLAN:  Principal Problem:   *Chest pain     The patient has had only very slight troponin elevation. However I am very impressed with his initial pain, radiation to the arm, and acute shortness of breath. There is a very slight troponin elevation. I wonder if he has a ruptured plaque that did not completely occluded vessel but had a small amount of downstream injury. I've discussed this possibility with the patient. He is very  active and he wants to be sure that he is stable. I feel that cardiac catheterization is indicated and he agrees. We will proceed with catheterization October 14, 2011.  Active Problems:  HYPERLIPIDEMIA  GERD  SLEEP APNEA  HYPERGLYCEMIA, FASTING  HTN (hypertension)   Signed: Willa Rough, MD, [facility]

## 2011-10-12 NOTE — Progress Notes (Signed)
PER MD WILL UPDATE STATUS TO OBSERVATION.

## 2011-10-13 ENCOUNTER — Other Ambulatory Visit: Payer: Self-pay

## 2011-10-13 ENCOUNTER — Encounter (HOSPITAL_COMMUNITY)
Admission: EM | Disposition: A | Discharge status: Home / Self Care | Payer: Self-pay | Source: Home / Self Care | Attending: Internal Medicine

## 2011-10-13 DIAGNOSIS — I2510 Atherosclerotic heart disease of native coronary artery without angina pectoris: Secondary | ICD-10-CM

## 2011-10-13 DIAGNOSIS — I252 Old myocardial infarction: Secondary | ICD-10-CM | POA: Diagnosis present

## 2011-10-13 HISTORY — PX: CORONARY ANGIOPLASTY WITH STENT PLACEMENT: SHX49

## 2011-10-13 HISTORY — PX: PERCUTANEOUS CORONARY STENT INTERVENTION (PCI-S): SHX5485

## 2011-10-13 HISTORY — PX: FRACTIONAL FLOW RESERVE WIRE: SHX5839

## 2011-10-13 HISTORY — PX: LEFT HEART CATHETERIZATION WITH CORONARY ANGIOGRAM: SHX5451

## 2011-10-13 LAB — PROTIME-INR: Prothrombin Time: 12.9 seconds (ref 11.6–15.2)

## 2011-10-13 LAB — GLUCOSE, CAPILLARY

## 2011-10-13 SURGERY — LEFT HEART CATHETERIZATION WITH CORONARY ANGIOGRAM
Anesthesia: LOCAL

## 2011-10-13 SURGERY — LEFT HEART CATHETERIZATION WITH CORONARY ANGIOGRAM
Anesthesia: LOCAL | Site: Hand | Laterality: Right

## 2011-10-13 MED ORDER — FENTANYL CITRATE 0.05 MG/ML IJ SOLN
INTRAMUSCULAR | Status: AC
  Filled 2011-10-13: qty 2

## 2011-10-13 MED ORDER — MORPHINE SULFATE 4 MG/ML IJ SOLN
INTRAMUSCULAR | Status: AC
  Filled 2011-10-13: qty 1

## 2011-10-13 MED ORDER — HEPARIN SODIUM (PORCINE) 1000 UNIT/ML IJ SOLN
INTRAMUSCULAR | Status: AC
  Filled 2011-10-13: qty 1

## 2011-10-13 MED ORDER — CLOPIDOGREL BISULFATE 75 MG PO TABS
75.0000 mg | ORAL_TABLET | Freq: Every day | ORAL | Status: DC
  Administered 2011-10-14: 75 mg via ORAL
  Filled 2011-10-13: qty 1

## 2011-10-13 MED ORDER — MIDAZOLAM HCL 2 MG/2ML IJ SOLN
INTRAMUSCULAR | Status: AC
  Filled 2011-10-13: qty 2

## 2011-10-13 MED ORDER — ROSUVASTATIN CALCIUM 40 MG PO TABS
40.0000 mg | ORAL_TABLET | Freq: Every evening | ORAL | Status: DC
  Filled 2011-10-13 (×3): qty 1

## 2011-10-13 MED ORDER — ASPIRIN 81 MG PO CHEW
324.0000 mg | CHEWABLE_TABLET | ORAL | Status: DC
  Filled 2011-10-13: qty 4

## 2011-10-13 MED ORDER — MORPHINE SULFATE 4 MG/ML IJ SOLN
2.0000 mg | INTRAMUSCULAR | Status: DC | PRN
  Administered 2011-10-13 (×2): 2 mg via INTRAVENOUS
  Filled 2011-10-13: qty 1

## 2011-10-13 MED ORDER — HEPARIN (PORCINE) IN NACL 2-0.9 UNIT/ML-% IJ SOLN
INTRAMUSCULAR | Status: AC
  Filled 2011-10-13: qty 2000

## 2011-10-13 MED ORDER — DIAZEPAM 5 MG PO TABS
5.0000 mg | ORAL_TABLET | ORAL | Status: DC
  Filled 2011-10-13: qty 1

## 2011-10-13 MED ORDER — VERAPAMIL HCL 2.5 MG/ML IV SOLN
INTRAVENOUS | Status: AC
  Filled 2011-10-13: qty 2

## 2011-10-13 MED ORDER — LIDOCAINE HCL (PF) 1 % IJ SOLN
INTRAMUSCULAR | Status: AC
  Filled 2011-10-13: qty 30

## 2011-10-13 MED ORDER — ADENOSINE 12 MG/4ML IV SOLN
16.0000 mL | Freq: Once | INTRAVENOUS | Status: DC
  Filled 2011-10-13: qty 16

## 2011-10-13 MED ORDER — PRASUGREL HCL 10 MG PO TABS
ORAL_TABLET | ORAL | Status: AC
  Filled 2011-10-13: qty 6

## 2011-10-13 MED ORDER — MORPHINE SULFATE 4 MG/ML IJ SOLN: 2.0000 mg | INTRAMUSCULAR | Status: DC | PRN

## 2011-10-13 MED ORDER — DIAZEPAM 5 MG PO TABS
5.0000 mg | ORAL_TABLET | ORAL | Status: AC
  Administered 2011-10-13: 5 mg via ORAL

## 2011-10-13 MED ORDER — SODIUM CHLORIDE 0.9 % IJ SOLN: 3.0000 mL | Freq: Two times a day (BID) | INTRAMUSCULAR | Status: DC

## 2011-10-13 MED ORDER — SODIUM CHLORIDE 0.9 % IV SOLN
1.0000 mL/kg/h | INTRAVENOUS | Status: AC
  Administered 2011-10-14: 1 mL/kg/h via INTRAVENOUS

## 2011-10-13 MED ORDER — SODIUM CHLORIDE 0.9 % IV SOLN
1.0000 mL/kg/h | INTRAVENOUS | Status: DC
  Administered 2011-10-13: 1 mL/kg/h via INTRAVENOUS

## 2011-10-13 NOTE — Progress Notes (Signed)
@   Subjective:  Denies CP or dyspnea   Objective:  Filed Vitals:   10/12/11 0904 10/12/11 1500 10/12/11 2125 10/13/11 0718  BP: 120/60 121/64 101/61 137/71  Pulse:  59 59 53  Temp:  97.9 F (36.6 C) 98.9 F (37.2 C) 98.4 F (36.9 C)  TempSrc:  Oral Oral Oral  Resp:  20 18 18   Height:      Weight:    203 lb 14.8 oz (92.5 kg)  SpO2:  96% 97% 98%    Intake/Output from previous day:  Intake/Output Summary (Last 24 hours) at 10/13/11 0736 Last data filed at 10/12/11 1815  Gross per 24 hour  Intake    603 ml  Output    601 ml  Net      2 ml    Physical Exam: Physical exam: Well-developed well-nourished in no acute distress.  Skin is warm and dry.  HEENT is normal.  Neck is supple. No thyromegaly.  Chest is clear to auscultation with normal expansion.  Cardiovascular exam is regular rate and rhythm.  Abdominal exam nontender or distended. No masses palpated. Extremities show no edema. neuro grossly intact    Lab Results: Basic Metabolic Panel:  Basename 10/12/11 0014 10/10/11 1850  NA 137 143  K 3.9 4.2  CL 102 105  CO2 27 --  GLUCOSE 118* 106*  BUN 11 13  CREATININE 0.88 1.10  CALCIUM 9.6 --  MG -- --  PHOS -- --   CBC:  Basename 10/12/11 0014 10/11/11 1202 10/10/11 1832  WBC 5.7 5.5 --  NEUTROABS -- -- 3.9  HGB 13.3 13.7 --  HCT 39.9 41.0 --  MCV 95.7 95.8 --  PLT 158 170 --   Cardiac Enzymes:  Basename 10/12/11 0014 10/11/11 1611 10/11/11 0825  CKTOTAL 132 152 164  CKMB 3.5 4.0 5.0*  CKMBINDEX -- -- --  TROPONINI <0.30 0.34* 0.47*   D-Dimer:  Basename 10/10/11 1925  DDIMER 1.10*   Hemoglobin A1C:  Basename 10/10/11 1832  HGBA1C 6.0*   Fasting Lipid Panel:  Basename 10/12/11 0505  CHOL 222*  HDL 60  LDLCALC 126*  TRIG 181*  CHOLHDL 3.7  LDLDIRECT --   Thyroid Function Tests:   Assessment/Plan:  1) NSTEMI - Continue ASA, lopressor and statin; on schedule for cath later today; patient not on full dose lovenox or heparin;  will not add given cath at noon today. 2) Hyperlipidemia - increase crestor to 40 mg daily  Olga Millers 10/13/2011, 7:36 AM

## 2011-10-13 NOTE — Progress Notes (Signed)
Subjective: "I feel great and appreciate the good care you all have taken of me" Denies Cp/SOB  Objective: Vital signs Filed Vitals:   10/12/11 0904 10/12/11 1500 10/12/11 2125 10/13/11 0718  BP: 120/60 121/64 101/61 137/71  Pulse:  59 59 53  Temp:  97.9 F (36.6 C) 98.9 F (37.2 C) 98.4 F (36.9 C)  TempSrc:  Oral Oral Oral  Resp:  20 18 18   Height:      Weight:    92.5 kg (203 lb 14.8 oz)  SpO2:  96% 97% 98%   Weight change:  Last BM Date: 10/10/11  Intake/Output from previous day: 11/28 0701 - 11/29 0700 In: 603 [P.O.:600; I.V.:3] Out: 601 [Urine:600; Stool:1]     Physical Exam: General: Alert, awake, oriented x3, in no acute distress. Smiling sitting up in bed HEENT: No bruits, no goiter. Heart: Regular rate and rhythm, without murmurs, rubs, gallops. Lungs: Normal effort. Clear to auscultation bilaterally.No wheeze Abdomen: Soft, nontender, nondistended, positive bowel sounds. Extremities: No clubbing cyanosis or edema with positive pedal pulses. Neuro: Grossly intact, nonfocal. Cranial nerve II-XII intact    Lab Results: Basic Metabolic Panel:  Basename 10/12/11 0014 10/10/11 1850  NA 137 143  K 3.9 4.2  CL 102 105  CO2 27 --  GLUCOSE 118* 106*  BUN 11 13  CREATININE 0.88 1.10  CALCIUM 9.6 --  MG -- --  PHOS -- --   Liver Function Tests: No results found for this basename: AST:2,ALT:2,ALKPHOS:2,BILITOT:2,PROT:2,ALBUMIN:2 in the last 72 hours No results found for this basename: LIPASE:2,AMYLASE:2 in the last 72 hours No results found for this basename: AMMONIA:2 in the last 72 hours CBC:  Basename 10/12/11 0014 10/11/11 1202 10/10/11 1832  WBC 5.7 5.5 --  NEUTROABS -- -- 3.9  HGB 13.3 13.7 --  HCT 39.9 41.0 --  MCV 95.7 95.8 --  PLT 158 170 --   Cardiac Enzymes:  Basename 10/12/11 0014 10/11/11 1611 10/11/11 0825  CKTOTAL 132 152 164  CKMB 3.5 4.0 5.0*  CKMBINDEX -- -- --  TROPONINI <0.30 0.34* 0.47*   BNP: No results found for this  basename: POCBNP:3 in the last 72 hours D-Dimer:  [facility] 10/10/11 1925  DDIMER 1.10*   CBG:  Basename 10/13/11 0754 10/12/11 2138 10/12/11 1706 10/12/11 1142 10/12/11 0657 10/11/11 2122  GLUCAP 108* 95 139* 103* 97 112*   Hemoglobin A1C:  Basename 10/10/11 1832  HGBA1C 6.0*   Fasting Lipid Panel:  Basename 10/12/11 0505  CHOL 222*  HDL 60  LDLCALC 126*  TRIG 181*  CHOLHDL 3.7  LDLDIRECT --   Thyroid Function Tests:  Basename 10/11/11 0825  TSH 2.572  T4TOTAL --  FREET4 --  T3FREE --  THYROIDAB --   Anemia Panel: No results found for this basename: VITAMINB12,FOLATE,FERRITIN,TIBC,IRON,RETICCTPCT in the last 72 hours Coagulation:  Basename 10/13/11 0633  LABPROT 12.9  INR 0.95   Urine Drug Screen: Drugs of Abuse  No results found for this basename: labopia, cocainscrnur, labbenz, amphetmu, thcu, labbarb    Alcohol Level: No results found for this basename: ETH:2 in the last 72 hours Urinalysis:  Misc. Labs:  No results found for this or any previous visit (from the past 240 hour(s)).  Studies/Results: No results found.  Medications: Scheduled Meds:   . aspirin  324 mg Oral Pre-Cath  . aspirin EC  325 mg Oral Daily  . diazepam  5 mg Oral On Call  . diazepam  5 mg Oral On Call  . enoxaparin  40  mg Subcutaneous Q24H  . fluticasone  1 spray Each Nare Daily  . insulin aspart  0-9 Units Subcutaneous TID WC  . loratadine  10 mg Oral QPM  . metoprolol tartrate  12.5 mg Oral BID  . pantoprazole  80 mg Oral Q1200  . rosuvastatin  40 mg Oral QPM  . sodium chloride  3 mL Intravenous Q12H  . sodium chloride  3 mL Intravenous Q12H  . DISCONTD: rosuvastatin  20 mg Oral QPM   Continuous Infusions:   . sodium chloride 1 mL/kg/hr (10/13/11 0406)   PRN Meds:.sodium chloride, acetaminophen, alum & mag hydroxide-simeth, morphine, nitroGLYCERIN, ondansetron (ZOFRAN) IV, ondansetron, sodium chloride, traMADol, zolpidem, DISCONTD: ondansetron (ZOFRAN)  IV  Assessment/Plan:  Principal Problem: 1. *Chest pain: No CP last 48hrs. No SOB. Pt for cath today at [facility]. Continue ASA Active Problems: 2. HYPERLIPIDEMIA: Crestor increased to 40mg  3 GERD: at baseline. Continue PPI 4 SLEEP APNEA: continue home cpap 5.HTN (hypertension): controlled continue BB    LOS: 3 days   [facility] M 10/13/2011, 9:09 AM

## 2011-10-13 NOTE — Op Note (Signed)
Cardiac Catheterization Procedure Note  Name: Robert [name] MRN: [id number] DOB: September 30, 1946  Procedure: Left Heart Cath, Selective Coronary Angiography, LV angiography, PTCA and stenting of the proximal right coronary artery.  Indication: 65 year old white male presented with a non-ST elevation myocardial infarction.  Procedural Details:  The right wrist was prepped, draped, and anesthetized with 1% lidocaine. Using the modified Seldinger technique, a 5 French sheath was introduced into the right radial artery. 3 mg of verapamil was administered through the sheath, weight-based unfractionated heparin was administered intravenously. Standard Judkins catheters were used for selective coronary angiography and left ventriculography. Catheter exchanges were performed over an exchange length guidewire.  PROCEDURAL FINDINGS Hemodynamics: AO 131/59 with a mean of 88 mmHg LV 133 with an EDP of 8 mm mercury   Coronary angiography: Coronary dominance: right  Left mainstem: Normal.  Left anterior descending (LAD): Normal.  Ramus intermediate branch: This is a large branch and has mild 20% stenosis proximally.  Left circumflex (LCx): Normal.  Right coronary artery (RCA): This is a dominant vessel. It has a 70% eccentric stenosis in the proximal vessel.  Left ventriculography: Left ventricular systolic function is normal, LVEF is estimated at 55-60%, there is no significant mitral regurgitation   PCI Note:  Following the diagnostic procedure, we proceeded with flow wire evaluation of the right coronary lesion. With adenosine infusion we measured a fractional flow reserve of 0.73. The decision was made to proceed with PCI.  Weight-based heparin was given for anticoagulation. Once a therapeutic ACT was achieved, a 5 [place] ERADR guide catheter was inserted.  The flow wire was used to cross the lesion.  The lesion was predilated with a 2.5 mm balloon.  The lesion was then stented with a 3.5 x 20 mm  Promus element stent.  The stent was postdilated with a 3.5 millimeter noncompliant balloon to 14 atmospheres.  Following PCI, there was 0% residual stenosis and TIMI-3 flow. Final angiography confirmed an excellent result. The patient tolerated the procedure well. There were no immediate procedural complications. A TR band was used for radial hemostasis. The patient was transferred to the post catheterization recovery area for further monitoring.  PCI Data: Vessel - RCA/Segment - proximal Percent Stenosis (pre)  70% with markedly abnormal fractional flow reserve. TIMI-flow 3 Stent 3.5 x 20 mm Promus element Percent Stenosis (post) 0% TIMI-flow (post) 3  Final Conclusions:   1. Single vessel obstructive coronary disease. 2. Normal left ventricular function. 3. Successful stenting of the proximal right coronary with a drug eluting stent.   Recommendations:  Continue aspirin and Plavix for one year.  [name] 10/13/2011, 5:30 PM

## 2011-10-13 NOTE — H&P (View-Only) (Signed)
@   Subjective:  Denies CP or dyspnea   Objective:  Filed Vitals:   10/12/11 0904 10/12/11 1500 10/12/11 2125 10/13/11 0718  BP: 120/60 121/64 101/61 137/71  Pulse:  59 59 53  Temp:  97.9 F (36.6 C) 98.9 F (37.2 C) 98.4 F (36.9 C)  TempSrc:  Oral Oral Oral  Resp:  20 18 18  Height:      Weight:    203 lb 14.8 oz (92.5 kg)  SpO2:  96% 97% 98%    Intake/Output from previous day:  Intake/Output Summary (Last 24 hours) at 10/13/11 0736 Last data filed at 10/12/11 1815  Gross per 24 hour  Intake    603 ml  Output    601 ml  Net      2 ml    Physical Exam: Physical exam: Well-developed well-nourished in no acute distress.  Skin is warm and dry.  HEENT is normal.  Neck is supple. No thyromegaly.  Chest is clear to auscultation with normal expansion.  Cardiovascular exam is regular rate and rhythm.  Abdominal exam nontender or distended. No masses palpated. Extremities show no edema. neuro grossly intact    Lab Results: Basic Metabolic Panel:  Basename 10/12/11 0014 10/10/11 1850  NA 137 143  K 3.9 4.2  CL 102 105  CO2 27 --  GLUCOSE 118* 106*  BUN 11 13  CREATININE 0.88 1.10  CALCIUM 9.6 --  MG -- --  PHOS -- --   CBC:  Basename 10/12/11 0014 10/11/11 1202 10/10/11 1832  WBC 5.7 5.5 --  NEUTROABS -- -- 3.9  HGB 13.3 13.7 --  HCT 39.9 41.0 --  MCV 95.7 95.8 --  PLT 158 170 --   Cardiac Enzymes:  Basename 10/12/11 0014 10/11/11 1611 10/11/11 0825  CKTOTAL 132 152 164  CKMB 3.5 4.0 5.0*  CKMBINDEX -- -- --  TROPONINI <0.30 0.34* 0.47*   D-Dimer:  Basename 10/10/11 1925  DDIMER 1.10*   Hemoglobin A1C:  Basename 10/10/11 1832  HGBA1C 6.0*   Fasting Lipid Panel:  Basename 10/12/11 0505  CHOL 222*  HDL 60  LDLCALC 126*  TRIG 181*  CHOLHDL 3.7  LDLDIRECT --   Thyroid Function Tests:   Assessment/Plan:  1) NSTEMI - Continue ASA, lopressor and statin; on schedule for cath later today; patient not on full dose lovenox or heparin;  will not add given cath at noon today. 2) Hyperlipidemia - increase crestor to 40 mg daily  Danie Hannig 10/13/2011, 7:36 AM    

## 2011-10-13 NOTE — Progress Notes (Signed)
UR CHART REVIEWED 

## 2011-10-13 NOTE — Interval H&P Note (Signed)
History and Physical Interval Note:  10/13/2011 4:07 PM  Robert [name]  has presented today for surgery, with the diagnosis of chest pain  The various methods of treatment have been discussed with the patient and family. After consideration of risks, benefits and other options for treatment, the patient has consented to  Procedure(s): LEFT HEART CATHETERIZATION WITH CORONARY ANGIOGRAM as a surgical intervention .  The patients' history has been reviewed, patient examined, no change in status, stable for surgery.  I have reviewed the patients' chart and labs.  Questions were answered to the patient's satisfaction.     Theron Arista [place]

## 2011-10-13 NOTE — Progress Notes (Signed)
Say and examine  Patient with Toya Smothers NP. Agree with her note.  CT angio negative for PE.  For Cardiac Cath today.

## 2011-10-14 ENCOUNTER — Other Ambulatory Visit: Payer: Self-pay

## 2011-10-14 DIAGNOSIS — I214 Non-ST elevation (NSTEMI) myocardial infarction: Secondary | ICD-10-CM

## 2011-10-14 LAB — BASIC METABOLIC PANEL
BUN: 10 mg/dL (ref 6–23)
CO2: 27 mEq/L (ref 19–32)
Chloride: 104 mEq/L (ref 96–112)
Creatinine, Ser: 0.89 mg/dL (ref 0.50–1.35)
GFR calc Af Amer: >90 mL/min (ref >90)

## 2011-10-14 LAB — CBC
HCT: 40.4 % (ref 39.0–52.0)
MCH: 32.7 pg (ref 26.0–34.0)
MCV: 95.1 fL (ref 78.0–100.0)
RDW: 13.2 % (ref 11.5–15.5)
WBC: 6.8 10*3/uL (ref 4.0–10.5)

## 2011-10-14 LAB — GLUCOSE, CAPILLARY: Glucose-Capillary: 108 mg/dL — ABNORMAL HIGH (ref 70–99)

## 2011-10-14 MED ORDER — NITROGLYCERIN 0.4 MG SL SUBL: 0.4000 mg | SUBLINGUAL_TABLET | SUBLINGUAL | Status: DC | PRN

## 2011-10-14 MED ORDER — METOPROLOL TARTRATE 12.5 MG HALF TABLET: 12.5000 mg | ORAL_TABLET | Freq: Two times a day (BID) | ORAL | Status: DC

## 2011-10-14 MED ORDER — ASPIRIN 325 MG PO TBEC: 325.0000 mg | DELAYED_RELEASE_TABLET | Freq: Every day | ORAL | Status: DC

## 2011-10-14 MED ORDER — CLOPIDOGREL BISULFATE 75 MG PO TABS: 75.0000 mg | ORAL_TABLET | Freq: Every day | ORAL | Status: DC

## 2011-10-14 MED ORDER — ROSUVASTATIN CALCIUM 40 MG PO TABS: 40.0000 mg | ORAL_TABLET | Freq: Every day | ORAL | Status: DC

## 2011-10-14 NOTE — Progress Notes (Signed)
   CARE MANAGEMENT NOTE 10/14/2011  Patient:  Robert [name], Robert [name]   Account Number:  [id number]  Date Initiated:  10/11/2011  Documentation initiated by:  Lanier Clam  Subjective/Objective Assessment:   ADMITTED W/ATYPICAL CHEST PAIN     Action/Plan:   FROM HOME   Anticipated DC Date:  10/14/2011   Anticipated DC Plan:  HOME/SELF CARE      DC Planning Services  CM consult      Choice offered to / List presented to:             Status of service:  Completed, signed off Medicare Important Message given?   (If response is "NO", the following Medicare IM given date fields will be blank) Date Medicare IM given:   Date Additional Medicare IM given:    Discharge Disposition:  HOME/SELF CARE  Per UR Regulation:  Reviewed for med. necessity/level of care/duration of stay  Comments:  10/14/11 12:25 Letha Cape RN, BSN [phone] patient transfer from [place] ,  dicharged to home.  10/12/11 KATHY MAHABIR RN,BSN [phone].MEDICARE OBSV INFO SHEET GIVEN. 11/27 KATHY MAHABIR RN,BSN [phone].UR CHART REVIEWED.

## 2011-10-14 NOTE — Progress Notes (Signed)
CARDIAC REHAB PHASE I   PRE:  Rate/Rhythm: 63 SR    BP: sitting 130/65    SaO2: 98 RA  MODE:  Ambulation: 500 ft   POST:  Rate/Rhythm: 73    BP: sitting 134/73     SaO2:   Tolerated well.  Ed completed. Requests his name be sent to [place] [facility]. [phone] Harriet Masson CES, ACSM

## 2011-10-14 NOTE — Progress Notes (Signed)
TR BAND REMOVAL  LOCATION:    right radial  DEFLATED PER PROTOCOL:    yes  TIME BAND OFF / DRESSING APPLIED:   0030  SITE UPON ARRIVAL:    Level 1  SITE AFTER BAND REMOVAL:    Level 1  REVERSE ALLEN'S TEST:    positive  CIRCULATION SENSATION AND MOVEMENT:    Within Normal Limits   yes  COMMENTS:   Reinflated 6cc of air at 2100 due to bleeding

## 2011-10-14 NOTE — Progress Notes (Signed)
Subjective: Denies any chest pain.  Denies in her shortness of breath.  Ambulated with physical therapy.  Objective: Vital signs in last 24 hours: Filed Vitals:   10/13/11 1933 10/14/11 0100 10/14/11 0103 10/14/11 0524  BP: 147/72  128/56 126/61  Pulse: 78  56 56  Temp: 98.2 F (36.8 C)  97.7 F (36.5 C) 98.1 F (36.7 C)  TempSrc: Oral Oral Oral Oral  Resp: 20  17 20   Height: 6\' 2"  (1.88 m)     Weight: 93.2 kg (205 lb 7.5 oz)   93.2 kg (205 lb 7.5 oz)  SpO2: 97%  97% 97%   Weight change:   Intake/Output Summary (Last 24 hours) at 10/14/11 0829 Last data filed at 10/14/11 0535  Gross per 24 hour  Intake      0 ml  Output   1375 ml  Net  -1375 ml   Physical Exam: General: Awake, Oriented, No acute distress. HEENT: EOMI. Neck: Supple CV: S1 and S2 Lungs: Clear to ascultation bilaterally Abdomen: Soft, Nontender, Nondistended, +bowel sounds. Ext: Good pulses. Trace edema.  Lab Results:  [facility] 10/14/11 0522 10/12/11 0014  NA 141 137  K 4.2 3.9  CL 104 102  CO2 27 27  GLUCOSE 94 118*  BUN 10 11  CREATININE 0.89 0.88  CALCIUM 9.1 9.6  MG -- --  PHOS -- --   No results found for this basename: AST:2,ALT:2,ALKPHOS:2,BILITOT:2,PROT:2,ALBUMIN:2 in the last 72 hours No results found for this basename: LIPASE:2,AMYLASE:2 in the last 72 hours  Basename 10/14/11 0522 10/12/11 0014  WBC 6.8 5.7  NEUTROABS -- --  HGB 13.9 13.3  HCT 40.4 39.9  MCV 95.1 95.7  PLT 168 158    Basename 10/12/11 0014 10/11/11 1611  CKTOTAL 132 152  CKMB 3.5 4.0  CKMBINDEX -- --  TROPONINI <0.30 0.34*   No results found for this basename: POCBNP:3 in the last 72 hours No results found for this basename: DDIMER:2 in the last 72 hours No results found for this basename: HGBA1C:2 in the last 72 hours  Basename 10/12/11 0505  CHOL 222*  HDL 60  LDLCALC 126*  TRIG 181*  CHOLHDL 3.7  LDLDIRECT --   No results found for this basename: TSH,T4TOTAL,FREET3,T3FREE,THYROIDAB in the  last 72 hours No results found for this basename: VITAMINB12:2,FOLATE:2,FERRITIN:2,TIBC:2,IRON:2,RETICCTPCT:2 in the last 72 hours  Micro Results: No results found for this or any previous visit (from the past 240 hour(s)).  Studies/Results: No results found.  Medications: I have reviewed the patient's current medications. Scheduled Meds:   . aspirin EC  325 mg Oral Daily  . clopidogrel  75 mg Oral Q breakfast  . diazepam  5 mg Oral On Call  . fentaNYL      . fluticasone  1 spray Each Nare Daily  . heparin      . heparin      . insulin aspart  0-9 Units Subcutaneous TID WC  . lidocaine      . loratadine  10 mg Oral QPM  . metoprolol tartrate  12.5 mg Oral BID  . midazolam      . pantoprazole  80 mg Oral Q1200  . prasugrel      . rosuvastatin  40 mg Oral QPM  . verapamil      . DISCONTD: adenosine  16 mL Intravenous Once  . DISCONTD: aspirin  324 mg Oral Pre-Cath  . DISCONTD: diazepam  5 mg Oral On Call  . DISCONTD: enoxaparin  40 mg Subcutaneous  Q24H  . DISCONTD: sodium chloride  3 mL Intravenous Q12H  . DISCONTD: sodium chloride  3 mL Intravenous Q12H   Continuous Infusions:   . sodium chloride 1 mL/kg/hr (10/14/11 0228)  . DISCONTD: sodium chloride 1 mL/kg/hr (10/13/11 0406)   PRN Meds:.acetaminophen, alum & mag hydroxide-simeth, morphine, nitroGLYCERIN, ondansetron (ZOFRAN) IV, ondansetron, traMADol, zolpidem, DISCONTD: sodium chloride, DISCONTD: morphine, DISCONTD: morphine, DISCONTD: sodium chloride  Assessment/Plan: 1. NSTEMI (non-ST elevated myocardial infarction) - patient had a cardiac catheter on 10/13/2011, drug-eluting stent was placed to the RCA.  Patient on aspirin and Plavix.  Patient will lead Plavix for at least one year.  Patient ambulated with cardiac rehabilitation today and did well.   2. HYPERLIPIDEMIA, rosuvastatin dose increased to 40 mg from 20 mg.  LDL on 10/12/2011 was 126.  3. GERD, on PPI.  4. SLEEP APNEA, on CPAP.  5. HYPERGLYCEMIA,  FASTING HEMOGLOBIN A1c on 10/10/2011 was 6.0.  6. HTN (hypertension) stable.  7.  Disposition.  Plan for discharge today.   LOS: 4 days  Xaviera Flaten A, MD 10/14/2011, 8:29 AM

## 2011-10-14 NOTE — Progress Notes (Signed)
@   Subjective:  Denies CP or dyspnea. No cath site complaints.   Objective:  Filed Vitals:   10/13/11 1933 10/14/11 0100 10/14/11 0103 10/14/11 0524  BP: 147/72  128/56 126/61  Pulse: 78  56 56  Temp: 98.2 F (36.8 C)  97.7 F (36.5 C) 98.1 F (36.7 C)  TempSrc: Oral Oral Oral Oral  Resp: 20  17 20   Height: 6\' 2"  (1.88 m)     Weight: 93.2 kg (205 lb 7.5 oz)   93.2 kg (205 lb 7.5 oz)  SpO2: 97%  97% 97%    Intake/Output from previous day:  Intake/Output Summary (Last 24 hours) at 10/14/11 0846 Last data filed at 10/14/11 0535  Gross per 24 hour  Intake      0 ml  Output   1375 ml  Net  -1375 ml    Physical Exam: Physical exam: Well-developed well-nourished in no acute distress.  Skin is warm and dry.  HEENT is normal.  Neck is supple. No thyromegaly.  Chest is clear to auscultation with normal expansion.  Cardiovascular exam is regular rate and rhythm.  Abdominal exam nontender or distended. No masses palpated. Extremities show no edema. No radial hematoma. neuro grossly intact    Lab Results: Basic Metabolic Panel:  Basename 10/14/11 0522 10/12/11 0014  NA 141 137  K 4.2 3.9  CL 104 102  CO2 27 27  GLUCOSE 94 118*  BUN 10 11  CREATININE 0.89 0.88  CALCIUM 9.1 9.6  MG -- --  PHOS -- --   CBC:  Basename 10/14/11 0522 10/12/11 0014  WBC 6.8 5.7  NEUTROABS -- --  HGB 13.9 13.3  HCT 40.4 39.9  MCV 95.1 95.7  PLT 168 158   Cardiac Enzymes:  Basename 10/12/11 0014 10/11/11 1611  CKTOTAL 132 152  CKMB 3.5 4.0  CKMBINDEX -- --  TROPONINI <0.30 0.34*   D-Dimer: No results found for this basename: DDIMER:2 in the last 72 hours Hemoglobin A1C: No results found for this basename: HGBA1C in the last 72 hours Fasting Lipid Panel:  Basename 10/12/11 0505  CHOL 222*  HDL 60  LDLCALC 126*  TRIG 181*  CHOLHDL 3.7  LDLDIRECT --   Thyroid Function Tests:   Assessment/Plan:  1) NSTEMI - Continue ASA,plavix, lopressor and statin;s/p DES of  proximal RCA. 2) Hyperlipidemia - increase crestor to 40 mg daily   Patient is stable for discharge today. We will follow up in approx. 2 weeks. Peter Swaziland 10/14/2011, 8:46 AM

## 2011-10-14 NOTE — Discharge Summary (Signed)
Discharge Summary  Robert [name] MR#: [id number]  DOB:18-Aug-1946  Date of Admission: 10/10/2011 Date of Discharge: 10/14/2011  Patient's PCP: Robert Melnick, MD, MD  Attending Physician:Gareth Fitzner A  Consults: [facility] - Dr. Myrtis Ser and Dr. Swaziland  Discharge Diagnoses: Principal Problem:  *NSTEMI (non-ST elevated myocardial infarction) Active Problems:  HYPERLIPIDEMIA  GERD  SLEEP APNEA  HYPERGLYCEMIA, FASTING  Chest pain  HTN (hypertension)  Brief Admitting History and Physical [name] with history of GERD, anxiety, hyperlipidemia, migraine and presented on 10/10/2011 with complaints of chest pain.  Discharge Medications Current Discharge Medication List    START taking these medications   Details  aspirin EC 325 MG EC tablet Take 1 tablet (325 mg total) by mouth daily. Qty: 30 tablet, Refills: 0    clopidogrel (PLAVIX) 75 MG tablet Take 1 tablet (75 mg total) by mouth daily with breakfast. Qty: 30 tablet, Refills: 3    metoprolol tartrate (LOPRESSOR) 12.5 mg TABS Take 0.5 tablets (12.5 mg total) by mouth 2 (two) times daily. Qty: 60 tablet, Refills: 0    nitroGLYCERIN (NITROSTAT) 0.4 MG SL tablet Place 1 tablet (0.4 mg total) under the tongue every 5 (five) minutes as needed for chest pain. Qty: 30 tablet, Refills: 1      CONTINUE these medications which have CHANGED   Details  rosuvastatin (CRESTOR) 40 MG tablet Take 1 tablet (40 mg total) by mouth daily. Qty: 30 tablet, Refills: 1      CONTINUE these medications which have NOT CHANGED   Details  levocetirizine (XYZAL) 5 MG tablet     mometasone (NASONEX) 50 MCG/ACT nasal spray Place 2 sprays into the nose daily. Qty: 17 g, Refills: 3    omeprazole (PRILOSEC) 20 MG capsule Take 20 mg by mouth daily.      traMADol (ULTRAM) 50 MG tablet Take 50 mg by mouth every 6 (six) hours as needed.          Hospital Course: NSTEMI (non-ST elevated myocardial infarction) Present on  Admission:  .GERD .HYPERGLYCEMIA, FASTING .SLEEP APNEA .Chest pain .HTN (hypertension) .NSTEMI (non-ST elevated myocardial infarction)   1. NSTEMI (non-ST elevated myocardial infarction) - patient was admitted to telemetry and troponins were trended.  Patient had initial troponin of 0.47 and then 0.34.  Patient also had a CT angiogram of chest on 10/10/2011 which showed no evidence for pulmonary embolism or other acute process.  Given the elevation in troponin cardiology was consult and for non-ST elevation MI. Patient had a cardiac catheter on 10/13/2011, drug-eluting stent was placed to the RCA. Patient on aspirin and Plavix. Patient will need Plavix for at least one year. Patient ambulated with cardiac rehabilitation prior to discharge.  Metoprolol was added to his regimen during the course of hospital stay.   2. HYPERLIPIDEMIA, rosuvastatin dose increased to 40 mg from 20 mg. LDL on 10/12/2011 was 126.   3. GERD, on PPI.   4. SLEEP APNEA, on CPAP.   5. HYPERGLYCEMIA, FASTING HEMOGLOBIN A1c on 10/10/2011 was 6.0.   6. HTN (hypertension) stable.  Low-dose metoprolol was added to his regimen.  Day of Discharge BP 126/61  Pulse 56  Temp(Src) 98.1 F (36.7 C) (Oral)  Resp 20  Ht 6\' 2"  (1.88 m)  Wt 93.2 kg (205 lb 7.5 oz)  BMI 26.38 kg/m2  SpO2 97%  Results for orders placed during the hospital encounter of 10/10/11 (from the past 48 hour(s))  GLUCOSE, CAPILLARY     Status: Abnormal   Collection Time  10/12/11 11:42 AM      Component Value Range Comment   Glucose-Capillary 103 (*) 70 - 99 (mg/dL)   GLUCOSE, CAPILLARY     Status: Abnormal   Collection Time   10/12/11  5:06 PM      Component Value Range Comment   Glucose-Capillary 139 (*) 70 - 99 (mg/dL)   GLUCOSE, CAPILLARY     Status: Normal   Collection Time   10/12/11  9:38 PM      Component Value Range Comment   Glucose-Capillary 95  70 - 99 (mg/dL)    Comment 1 Documented in Chart      Comment 2 Notify RN       PROTIME-INR     Status: Normal   Collection Time   10/13/11  6:33 AM      Component Value Range Comment   Prothrombin Time 12.9  11.6 - 15.2 (seconds)    INR 0.95  0.00 - 1.49    GLUCOSE, CAPILLARY     Status: Abnormal   Collection Time   10/13/11  7:54 AM      Component Value Range Comment   Glucose-Capillary 108 (*) 70 - 99 (mg/dL)   POCT ACTIVATED CLOTTING TIME     Status: Normal   Collection Time   10/13/11  4:53 PM      Component Value Range Comment   Activated Clotting Time 243     GLUCOSE, CAPILLARY     Status: Abnormal   Collection Time   10/13/11 10:36 PM      Component Value Range Comment   Glucose-Capillary 142 (*) 70 - 99 (mg/dL)    Comment 1 Notify RN      Comment 2 Documented in Chart     CBC     Status: Normal   Collection Time   10/14/11  5:22 AM      Component Value Range Comment   WBC 6.8  4.0 - 10.5 (K/uL)    RBC 4.25  4.22 - 5.81 (MIL/uL)    Hemoglobin 13.9  13.0 - 17.0 (g/dL)    HCT 95.6  21.3 - 08.6 (%)    MCV 95.1  78.0 - 100.0 (fL)    MCH 32.7  26.0 - 34.0 (pg)    MCHC 34.4  30.0 - 36.0 (g/dL)    RDW 57.8  46.9 - 62.9 (%)    Platelets 168  150 - 400 (K/uL)   BASIC METABOLIC PANEL     Status: Abnormal   Collection Time   10/14/11  5:22 AM      Component Value Range Comment   Sodium 141  135 - 145 (mEq/L)    Potassium 4.2  3.5 - 5.1 (mEq/L)    Chloride 104  96 - 112 (mEq/L)    CO2 27  19 - 32 (mEq/L)    Glucose, Bld 94  70 - 99 (mg/dL)    BUN 10  6 - 23 (mg/dL)    Creatinine, Ser 5.28  0.50 - 1.35 (mg/dL)    Calcium 9.1  8.4 - 10.5 (mg/dL)    GFR calc non Af Amer 87 (*) >90 (mL/min)    GFR calc Af Amer >90  >90 (mL/min)   GLUCOSE, CAPILLARY     Status: Abnormal   Collection Time   10/14/11  8:28 AM      Component Value Range Comment   Glucose-Capillary 108 (*) 70 - 99 (mg/dL)     Ct Angio Chest W/cm &/or Wo Cm  10/10/2011  *RADIOLOGY REPORT*  Clinical Data:  Chest pain and shortness of breath.  CT ANGIOGRAPHY CHEST WITH CONTRAST   Technique:  Multidetector CT imaging of the chest was performed using the standard protocol during bolus administration of intravenous contrast.  Multiplanar CT image reconstructions including MIPs were obtained to evaluate the vascular anatomy.  Contrast: OMNIPAQUE IOHEXOL 300 MG/ML IV SOLN  Comparison:  None  Findings:  The pulmonary arteries are adequately opacified.  There is no evidence of pulmonary embolism.  Lungs show no evidence of edema, infiltrate or nodule.  No pleural or pericardial fluid.  The thoracic aorta is of normal caliber.  Scattered areas of parenchymal scarring and atelectasis noted in the lower lung zones bilaterally.  Bony structures are unremarkable.  Review of the MIP images confirms the above findings.  IMPRESSION: No evidence of pulmonary embolism or other acute process.  Original Report Authenticated By: Reola Calkins, M.D.   Dg Chest Portable 1 View  10/10/2011  *RADIOLOGY REPORT*  Clinical Data: Chest pain, shortness of breath, former smoker  PORTABLE CHEST - 1 VIEW  Comparison: Portable exam 1837 hours compared to 12/11/2001  Findings: Normal heart size, mediastinal contours, and pulmonary vascularity for technique. Atherosclerotic calcification aortic arch. Lungs clear. No pleural effusion or pneumothorax. Bones unremarkable.  IMPRESSION: No acute abnormalities.  Original Report Authenticated By: Lollie Marrow, M.D.   2-D echocardiogram on 10/11/2011 Study Conclusions - Left ventricle: The cavity size was normal. There was mild concentric hypertrophy. Systolic function was normal. The estimated ejection fraction was in the range of 55% to 60%. Wall motion was normal; there were no regional wall motion abnormalities. Doppler parameters are consistent with abnormal left ventricular relaxation (grade 1 diastolic dysfunction). The E/e' ratio is >10, suggesting elevated LV filling pressure. - Left atrium: The atrium is mildly dilated.  Disposition:  Home  Diet: Heart healthy diet  Activity: Resume as tolerated with restrictions as listed below.   Follow-up Appts: Discharge Orders    Future Orders Please Complete By Expires   Diet - low sodium heart healthy      Increase activity slowly      Lifting restrictions      Comments:   No lifting over 5 lbs for one week.   Driving Restrictions      Comments:   No driving for 1 week.   Other Restrictions      Comments:   No working for 1 week.   Discharge wound care:      Comments:   Watching for any bleeding, pus, or pain. Call [facility] for questions.   Discharge instructions      Comments:   Followup with your Robert Melnick, MD (PCP) in 1 week. Followup with Dr. Swaziland (Cardiology) office will call with appointment.     Follow-up Information    Follow up with Robert Melnick, MD. Make an appointment in 1 week.   Contact information:   [address] [facility] [address] [place] [address] [phone]       Follow up with Peter Swaziland, MD in 2 weeks. (Will be called with appointment date and time.)    Contact information:   [address] [place] [address] [phone]          TESTS THAT NEED FOLLOW-UP None  Time spent on discharge, talking to the patient, and coordinating care: 35 mins.   Signed: Cristal Ford, MD 10/14/2011, 8:51  AM  

## 2011-10-15 DIAGNOSIS — I219 Acute myocardial infarction, unspecified: Secondary | ICD-10-CM

## 2011-10-15 HISTORY — DX: Acute myocardial infarction, unspecified: I21.9

## 2011-10-17 ENCOUNTER — Telehealth: Payer: Self-pay | Admitting: Cardiology

## 2011-10-17 MED ORDER — PANTOPRAZOLE SODIUM 40 MG PO TBEC: 40.0000 mg | DELAYED_RELEASE_TABLET | Freq: Every day | ORAL | Status: DC

## 2011-10-17 NOTE — Telephone Encounter (Signed)
New message;  Discharged from hosp on Friday and now having problems with indigestion.  Please call and advise what he should do.

## 2011-10-17 NOTE — Telephone Encounter (Signed)
Called stating he is having indigestion. States he notices it after taking medication. Per Dr. Swaziland that Xyzal could cause indigestion. Can change his Prilosec to Protonix 40 mg. He wants to change. Will send into [facility]. Also advised to take ASA 81 mg instead of 325 mg.

## 2011-10-21 ENCOUNTER — Telehealth: Payer: Self-pay | Admitting: Cardiology

## 2011-10-21 ENCOUNTER — Encounter: Payer: Self-pay | Admitting: Internal Medicine

## 2011-10-21 ENCOUNTER — Ambulatory Visit: Payer: Medicare Other | Admitting: Internal Medicine

## 2011-10-21 ENCOUNTER — Ambulatory Visit (INDEPENDENT_AMBULATORY_CARE_PROVIDER_SITE_OTHER): Payer: Medicare Other | Admitting: Internal Medicine

## 2011-10-21 DIAGNOSIS — I2510 Atherosclerotic heart disease of native coronary artery without angina pectoris: Secondary | ICD-10-CM

## 2011-10-21 DIAGNOSIS — I214 Non-ST elevation (NSTEMI) myocardial infarction: Secondary | ICD-10-CM

## 2011-10-21 DIAGNOSIS — R7309 Other abnormal glucose: Secondary | ICD-10-CM

## 2011-10-21 DIAGNOSIS — I10 Essential (primary) hypertension: Secondary | ICD-10-CM

## 2011-10-21 NOTE — Progress Notes (Signed)
Addended by: Legrand Como on: 10/21/2011 03:03 PM   Modules accepted: Orders

## 2011-10-21 NOTE — Patient Instructions (Signed)
Please  schedule fasting Labs 10 weeks after Crestor was increased : Lipids, hepatic panel. PLEASE BRING THESE INSTRUCTIONS TO FOLLOW UP  LAB APPOINTMENT.This will guarantee correct labs are drawn, eliminating need for repeat blood sampling ( needle sticks ! ). Diagnoses /Codes: 045.4,098.11

## 2011-10-21 NOTE — Progress Notes (Signed)
  Subjective:    Patient ID: Robert [name], male    DOB: December 14, 1945, 65 y.o.   MRN: [id number]  HPI Robert [name] is here in followup; he had a non-ST T. elevation MI 11/26. On 11/29 had stenting of a single vessel. He is now on Plavix. His Crestor was increased to 40 mg daily in attempts to obtain an LDL goal of less than 70.  Since he left the hospital he has  had minimal chest pain; his exertional dyspnea is improving. He denies any palpitations or claudication. He is now in Cardiac Rehab.  [facility] [phone]; he has had fasting hyperglycemia in the past.    Review of Systems He denies polyuria/ phagia/ dipsia.     Objective:   Physical Exam  He appears healthy and well-nourished in no acute distress  Chest is clear with no increased work of breathing or rales. He has a soft S4 no significant murmurs.  All pulses are intact without deficits or bruits.  There is no cyanosis, clubbing, or edema        Assessment & Plan:  #1 coronary artery disease status post stenting; he is on Plavix and statin.  #2 hyperglycemia; A1c needs to be checked.  Plan: He should have fasting lipids 10 weeks after the increase in Crestor dose

## 2011-10-21 NOTE — Telephone Encounter (Signed)
Spoke w/wife. Advised paper for rehab was faxed over today.

## 2011-10-21 NOTE — Telephone Encounter (Signed)
New message:  They had faxed an order for him to start rehab and they have not received this back as yet.  He is to start on Monday.  They need this asap

## 2011-10-22 LAB — MICROALBUMIN / CREATININE URINE RATIO
Creatinine, Urine: 126 mg/dL
Microalb Creat Ratio: 4 mg/g (ref 0.0–30.0)

## 2011-10-24 ENCOUNTER — Encounter (HOSPITAL_COMMUNITY)
Admission: RE | Admit: 2011-10-24 | Discharge: 2011-10-24 | Disposition: A | Discharge status: Home / Self Care | Payer: Medicare Other | Source: Ambulatory Visit | Attending: Cardiology | Admitting: Cardiology

## 2011-10-24 DIAGNOSIS — Z7982 Long term (current) use of aspirin: Secondary | ICD-10-CM | POA: Insufficient documentation

## 2011-10-24 DIAGNOSIS — Z888 Allergy status to other drugs, medicaments and biological substances status: Secondary | ICD-10-CM | POA: Insufficient documentation

## 2011-10-24 DIAGNOSIS — Z9861 Coronary angioplasty status: Secondary | ICD-10-CM | POA: Insufficient documentation

## 2011-10-24 DIAGNOSIS — Z880 Allergy status to penicillin: Secondary | ICD-10-CM | POA: Insufficient documentation

## 2011-10-24 DIAGNOSIS — K219 Gastro-esophageal reflux disease without esophagitis: Secondary | ICD-10-CM | POA: Insufficient documentation

## 2011-10-24 DIAGNOSIS — I10 Essential (primary) hypertension: Secondary | ICD-10-CM | POA: Insufficient documentation

## 2011-10-24 DIAGNOSIS — Z5189 Encounter for other specified aftercare: Secondary | ICD-10-CM | POA: Insufficient documentation

## 2011-10-24 DIAGNOSIS — E785 Hyperlipidemia, unspecified: Secondary | ICD-10-CM | POA: Insufficient documentation

## 2011-10-24 DIAGNOSIS — I2510 Atherosclerotic heart disease of native coronary artery without angina pectoris: Secondary | ICD-10-CM | POA: Insufficient documentation

## 2011-10-24 DIAGNOSIS — G4730 Sleep apnea, unspecified: Secondary | ICD-10-CM | POA: Insufficient documentation

## 2011-10-24 DIAGNOSIS — I214 Non-ST elevation (NSTEMI) myocardial infarction: Secondary | ICD-10-CM | POA: Insufficient documentation

## 2011-10-24 DIAGNOSIS — E7800 Pure hypercholesterolemia, unspecified: Secondary | ICD-10-CM | POA: Insufficient documentation

## 2011-10-24 DIAGNOSIS — Z794 Long term (current) use of insulin: Secondary | ICD-10-CM | POA: Insufficient documentation

## 2011-10-24 NOTE — Progress Notes (Signed)
Robert [name] 65 y.o. male       Nutrition Screen                                                                    YES  NO Do you live in a nursing home?  X   Do you eat out more than 3 times/week?    X If yes, how many times per week do you eat out?  Do you have food allergies?   X If yes, what are you allergic to?  Have you gained or lost more than 10 lbs without trying?               X If yes, how much weight have you lost and over what time period?  lbs gained or lost over  weeks/month  Do you want to lose weight?     X If yes, what is a goal weight or amount of weight you would like to lose?  Do you eat alone most of the time?   X   Do you eat less than 2 meals/day?  X If yes, how many meals do you eat?  Do you drink more than 3 alcohol drinks/day?  X If yes, how many drinks per day?  Are you having trouble with constipation? *  X If yes, what are you doing to help relieve constipation?  Do you have financial difficulties with buying food?*    X   Are you experiencing regular nausea/ vomiting?*     X   Do you have a poor appetite? *                                        X   Do you have trouble chewing/swallowing? *   X    Pt with diagnoses of:  X MI                          X Stent/ PTCA      X Pre-diabetes            X GERD          X Dyslipidemia  / HDL< 40 / LDL>70 / High TG      X %  Body fat >goal / Body Mass Index >25 X HTN / BP >120/80       Pt Risk Score    0       Diagnosis Risk Score  35       Total Risk Score   35                         High Risk               X Low Risk              HT: 73.25" Ht Readings from Last 1 Encounters:  10/13/11 6\' 2"  (1.88 m)    WT: 211.2 lb (96 kg) Wt Readings from Last 3 Encounters:  10/21/11 211 lb 6.4 oz (95.89 kg)  10/14/11 205 lb 7.5 oz (93.2 kg)  10/14/11 205 lb 7.5 oz (93.2 kg)     IBW 84.3 114%IBW BMI 27.7 31.7%body fat  Meds:  Past Medical History  Diagnosis Date  . GERD (gastroesophageal reflux disease)   .  Cancer   . Migraines   . CAD (coronary artery disease) 10/10/2011    NSTEMI        Activity level: Pt is sedentary  Wt goal: 187-199 lb ( 85-90.5 kg) Current tobacco use? No       Food/Drug Interaction? No  Labs:  Lipid Panel     Component Value Date/Time   CHOL 222* 10/12/2011 0505   TRIG 181* 10/12/2011 0505   HDL 60 10/12/2011 0505   CHOLHDL 3.7 10/12/2011 0505   VLDL 36 10/12/2011 0505   LDLCALC 126* 10/12/2011 0505   Lab Results  Component Value Date   HGBA1C 5.9* 10/21/2011    10/10/11 Glucose 106   LDL goal: < 70      MI, DM, Carotid or PVD and > 2:      tobacco, HTN, HDL, family h/o, lipoprotein a     > 67 yo male or        >67 yo male   Estimated Daily Nutrition Needs for: ? wt loss 1700-2200 Kcal , Total Fat 45-60gm, Saturated Fat 12-17 gm, Trans Fat 1.6-2.2 gm,  Sodium less than 1500 mg

## 2011-10-25 NOTE — Progress Notes (Signed)
Pt started cardiac rehab today.  Pt tolerated light exercise without difficulty.  Denies CP or dyspnea with exercise.  Telemetry normal sinus rhythm.  VSS. .  Will continue to monitor throughout rehab program.   Pt oriented to use cardiac rehab routine and equipment use.  Understanding verbalized.

## 2011-10-26 ENCOUNTER — Ambulatory Visit (INDEPENDENT_AMBULATORY_CARE_PROVIDER_SITE_OTHER): Payer: Medicare Other | Admitting: Nurse Practitioner

## 2011-10-26 ENCOUNTER — Encounter (HOSPITAL_COMMUNITY)
Admission: RE | Admit: 2011-10-26 | Discharge: 2011-10-26 | Disposition: A | Discharge status: Home / Self Care | Payer: Medicare Other | Source: Ambulatory Visit | Attending: Cardiology | Admitting: Cardiology

## 2011-10-26 ENCOUNTER — Other Ambulatory Visit: Payer: Self-pay | Admitting: *Deleted

## 2011-10-26 ENCOUNTER — Encounter: Payer: Self-pay | Admitting: Nurse Practitioner

## 2011-10-26 ENCOUNTER — Encounter (HOSPITAL_COMMUNITY): Payer: Medicare Other

## 2011-10-26 VITALS — BP 128/62 | HR 60 | Ht 73.0 in | Wt 212.8 lb

## 2011-10-26 DIAGNOSIS — I10 Essential (primary) hypertension: Secondary | ICD-10-CM

## 2011-10-26 DIAGNOSIS — E782 Mixed hyperlipidemia: Secondary | ICD-10-CM

## 2011-10-26 DIAGNOSIS — I214 Non-ST elevation (NSTEMI) myocardial infarction: Secondary | ICD-10-CM

## 2011-10-26 MED ORDER — PANTOPRAZOLE SODIUM 40 MG PO TBEC: 40.0000 mg | DELAYED_RELEASE_TABLET | Freq: Every day | ORAL | Status: DC

## 2011-10-26 MED ORDER — ROSUVASTATIN CALCIUM 40 MG PO TABS: 40.0000 mg | ORAL_TABLET | Freq: Every day | ORAL | Status: DC

## 2011-10-26 MED ORDER — METOPROLOL TARTRATE 25 MG PO TABS: 12.5000 mg | ORAL_TABLET | Freq: Two times a day (BID) | ORAL | Status: DC

## 2011-10-26 MED ORDER — CLOPIDOGREL BISULFATE 75 MG PO TABS: 75.0000 mg | ORAL_TABLET | Freq: Every day | ORAL | Status: DC

## 2011-10-26 NOTE — Assessment & Plan Note (Signed)
Blood pressure is ok. No change in his medicines.  

## 2011-10-26 NOTE — Progress Notes (Signed)
Robert [name] Date of Birth: 1946/10/21 Medical Record #[id number]  History of Present Illness: [name] is seen today for a post hospital visit. He is seen for Dr. Swaziland. He has had a recent NSTEMI with DES to the proximal RCA following flow wire evaluation. EF is normal. Peak troponin was 0.47. He also had a negative CTA of the chest. He is committed to Plavix for one year. Metoprolol was added. His dose of Crestor was increased due to LDL of 126. His other problems include hyperlipidemia and GERD.  He comes in today. He is doing well. He is tolerating his medicines. No chest pain or shortness of breath. Says he is feeling better every day. Started rehab earlier this week. He did have some fleeting sharp pains initially after discharge. That is now resolved.   Current Outpatient Prescriptions on File Prior to Visit  Medication Sig Dispense Refill  . aspirin 81 MG tablet Take 81 mg by mouth daily.        . clopidogrel (PLAVIX) 75 MG tablet Take 1 tablet (75 mg total) by mouth daily with breakfast.  30 tablet  3  . levocetirizine (XYZAL) 5 MG tablet       . mometasone (NASONEX) 50 MCG/ACT nasal spray Place 2 sprays into the nose daily.  17 g  3  . nitroGLYCERIN (NITROSTAT) 0.4 MG SL tablet Place 1 tablet (0.4 mg total) under the tongue every 5 (five) minutes as needed for chest pain.  30 tablet  1  . pantoprazole (PROTONIX) 40 MG tablet Take 1 tablet (40 mg total) by mouth daily.  30 tablet  5  . rosuvastatin (CRESTOR) 40 MG tablet Take 1 tablet (40 mg total) by mouth daily.  30 tablet  1  . traMADol (ULTRAM) 50 MG tablet Take 50 mg by mouth every 6 (six) hours as needed.          Allergies  Allergen Reactions  . Penicillins     itching  . Sumatriptan     REACTION: sob, ha, scalp sensitivity    Past Medical History  Diagnosis Date  . GERD (gastroesophageal reflux disease)   . Cancer   . Migraines   . CAD (coronary artery disease) 10/10/2011    NSTEMI    Past Surgical History    Procedure Date  . Back surgery   . Shoulder surgery   . Hand surgery   . Coronary stent placement 10/13/2011    Dr Peter Swaziland, single vessel    History  Smoking status  . Former Smoker  . Types: Cigarettes  Smokeless tobacco  . Never Used  Comment: Age 65    History  Alcohol Use  . 0.0 oz/week  . 5-6 Cans of beer per week    Family History  Problem Relation Age of Onset  . Cancer Mother   . Heart disease Father     heart attack     Review of Systems: The review of systems is per the HPI.  All other systems were reviewed and are negative.  Physical Exam: BP 132/68  Pulse 60  Ht 6\' 1"  (1.854 m)  Wt 212 lb 12.8 oz (96.525 kg)  BMI 28.08 kg/m2 Patient is very pleasant and in no acute distress. Skin is warm and dry. Color is normal.  HEENT is unremarkable. Normocephalic/atraumatic. PERRL. Sclera are nonicteric. Neck is supple. No masses. No JVD. Lungs are clear. Cardiac exam shows a regular rate and rhythm. Abdomen is soft. Extremities are without edema.  Gait and ROM are intact. No gross neurologic deficits noted.   LABORATORY DATA:   Assessment / Plan:

## 2011-10-26 NOTE — Assessment & Plan Note (Signed)
Crestor was increased.

## 2011-10-26 NOTE — Patient Instructions (Signed)
I think you are doing well. Stay on your current medicines.  Continue with cardiac rehab.  I will have you see Dr. Swaziland in about 8 weeks.  Call the [facility] office at [phone] if you have any questions, problems or concerns.

## 2011-10-26 NOTE — Assessment & Plan Note (Signed)
He is doing well and looks good. EF is normal. He is in rehab. I have refilled his medicines for 90 days. I will have him see Dr. Swaziland in 8 weeks. Patient is agreeable to this plan and will call if any problems develop in the interim.

## 2011-10-28 ENCOUNTER — Encounter (HOSPITAL_COMMUNITY)
Admission: RE | Admit: 2011-10-28 | Discharge: 2011-10-28 | Disposition: A | Discharge status: Home / Self Care | Payer: Medicare Other | Source: Ambulatory Visit | Attending: Cardiology | Admitting: Cardiology

## 2011-10-31 ENCOUNTER — Encounter (HOSPITAL_COMMUNITY)
Admission: RE | Admit: 2011-10-31 | Discharge: 2011-10-31 | Disposition: A | Discharge status: Home / Self Care | Payer: Medicare Other | Source: Ambulatory Visit | Attending: Cardiology | Admitting: Cardiology

## 2011-11-02 ENCOUNTER — Encounter (HOSPITAL_COMMUNITY)
Admission: RE | Admit: 2011-11-02 | Discharge: 2011-11-02 | Disposition: A | Discharge status: Home / Self Care | Payer: Medicare Other | Source: Ambulatory Visit | Attending: Cardiology | Admitting: Cardiology

## 2011-11-04 ENCOUNTER — Encounter (HOSPITAL_COMMUNITY)
Admission: RE | Admit: 2011-11-04 | Discharge: 2011-11-04 | Disposition: A | Discharge status: Home / Self Care | Payer: Medicare Other | Source: Ambulatory Visit | Attending: Cardiology | Admitting: Cardiology

## 2011-11-18 ENCOUNTER — Encounter (HOSPITAL_COMMUNITY)
Admission: RE | Admit: 2011-11-18 | Discharge: 2011-11-18 | Disposition: A | Discharge status: Home / Self Care | Payer: Medicare Other | Source: Ambulatory Visit | Attending: Cardiology | Admitting: Cardiology

## 2011-11-18 DIAGNOSIS — Z9861 Coronary angioplasty status: Secondary | ICD-10-CM | POA: Insufficient documentation

## 2011-11-18 DIAGNOSIS — Z888 Allergy status to other drugs, medicaments and biological substances status: Secondary | ICD-10-CM | POA: Insufficient documentation

## 2011-11-18 DIAGNOSIS — I214 Non-ST elevation (NSTEMI) myocardial infarction: Secondary | ICD-10-CM | POA: Insufficient documentation

## 2011-11-18 DIAGNOSIS — K219 Gastro-esophageal reflux disease without esophagitis: Secondary | ICD-10-CM | POA: Insufficient documentation

## 2011-11-18 DIAGNOSIS — I2510 Atherosclerotic heart disease of native coronary artery without angina pectoris: Secondary | ICD-10-CM | POA: Insufficient documentation

## 2011-11-18 DIAGNOSIS — Z5189 Encounter for other specified aftercare: Secondary | ICD-10-CM | POA: Insufficient documentation

## 2011-11-18 DIAGNOSIS — G4730 Sleep apnea, unspecified: Secondary | ICD-10-CM | POA: Insufficient documentation

## 2011-11-18 DIAGNOSIS — E785 Hyperlipidemia, unspecified: Secondary | ICD-10-CM | POA: Insufficient documentation

## 2011-11-18 DIAGNOSIS — Z880 Allergy status to penicillin: Secondary | ICD-10-CM | POA: Insufficient documentation

## 2011-11-18 DIAGNOSIS — Z794 Long term (current) use of insulin: Secondary | ICD-10-CM | POA: Insufficient documentation

## 2011-11-18 DIAGNOSIS — I10 Essential (primary) hypertension: Secondary | ICD-10-CM | POA: Insufficient documentation

## 2011-11-18 DIAGNOSIS — Z7982 Long term (current) use of aspirin: Secondary | ICD-10-CM | POA: Insufficient documentation

## 2011-11-18 DIAGNOSIS — E7800 Pure hypercholesterolemia, unspecified: Secondary | ICD-10-CM | POA: Insufficient documentation

## 2011-11-21 ENCOUNTER — Encounter (HOSPITAL_COMMUNITY)
Admission: RE | Admit: 2011-11-21 | Discharge: 2011-11-21 | Disposition: A | Discharge status: Home / Self Care | Payer: Medicare Other | Source: Ambulatory Visit | Attending: Cardiology | Admitting: Cardiology

## 2011-11-21 NOTE — Progress Notes (Signed)
Robert [name] 66 y.o. male Nutrition Note  Spoke with pt.  Nutrition Plan reviewed with pt.  Pt states his original goal was to "not gain wt."  Pt encouraged to lose 10-15 lbs to help improve insulin sensitivity given pt pre-diabetic.  Pt reports he was aware he was pre-diabetic.  Pre-diabetes and need for wt loss discussed.  Weight loss tips reviewed. Pt expressed understanding.      Nutrition Diagnosis   Food-and nutrition-related knowledge deficit related to lack of exposure to information as related to diagnosis of: ? CVD ?  Pre-DM (A1c 5.9)     Overweightrelated to excessive energy intake as evidenced by a BMI of 27.7  Nutrition RX/ Estimated Daily Nutrition Needs for: wt loss  1700-2200 Kcal, 45-60 gm fat, 12-17 gm sat fat, 1.6-2.2 gm trans-fat, <1500 mg sodium Nutrition Intervention   Pt's individual nutrition plan including cholesterol goals reviewed with pt.   Pt to attend the Portion Distortion class   Pt to attend the ? Nutrition I class                        ? Nutrition II class       ? Diabetes Q & A class   Pt given handouts for: ? wt loss ? pre-DM    Continue client-centered nutrition education by RD, as part of interdisciplinary care. Goal(s)   Pt to identify and limit food sources of saturated fat, trans fat, and cholesterol   Pt to identify food quantities necessary to achieve: ? wt loss to a goal wt of 85-91.4 kg (187-201lb) at graduation from cardiac rehab.    Pt to describe and follow a basic diabetic meal plan. Monitor and Evaluate progress toward nutrition goal with team.

## 2011-11-23 ENCOUNTER — Encounter (HOSPITAL_COMMUNITY)
Admission: RE | Admit: 2011-11-23 | Discharge: 2011-11-23 | Disposition: A | Discharge status: Home / Self Care | Payer: Medicare Other | Source: Ambulatory Visit | Attending: Cardiology | Admitting: Cardiology

## 2011-11-25 ENCOUNTER — Encounter (HOSPITAL_COMMUNITY)
Admission: RE | Admit: 2011-11-25 | Discharge: 2011-11-25 | Disposition: A | Discharge status: Home / Self Care | Payer: Medicare Other | Source: Ambulatory Visit | Attending: Cardiology | Admitting: Cardiology

## 2011-11-28 ENCOUNTER — Encounter (HOSPITAL_COMMUNITY)
Admission: RE | Admit: 2011-11-28 | Discharge: 2011-11-28 | Disposition: A | Discharge status: Home / Self Care | Payer: Medicare Other | Source: Ambulatory Visit | Attending: Cardiology | Admitting: Cardiology

## 2011-11-28 NOTE — Progress Notes (Signed)
Robert [name] 66 y.o. male Nutrition Note Spoke with pt.  Nutrition Survey reviewed with pt. Pt expressed understanding.  Nutrition Diagnosis   Food-and nutrition-related knowledge deficit related to lack of exposure to information as related to diagnosis of: ? CVD ?  Pre-DM (A1c 5.9)     Overweightrelated to excessive energy intake as evidenced by a BMI of 27.7  Nutrition RX/ Estimated Daily Nutrition Needs for: wt loss 1700-2200 Kcal, 45-60 gm fat, 12-17 gm sat fat, 1.6-2.2 gm trans-fat, <1500 mg sodium Nutrition Intervention   Benefits of adopting Therapeutic Lifestyle Changes discussed when Medficts results reviewed with pt.   Pt to attend the Portion Distortion class   Pt to attend the ? Nutrition I class                     ? Nutrition II class       ? Diabetes Q & A class   Pt given handouts for: ? wt loss ? pre-DM    Continue client-centered nutrition education by RD, as part of interdisciplinary care. Goal(s)   Pt to identify and limit food sources of saturated fat, trans fat, and cholesterol   Pt to identify food quantities necessary to achieve: ? wt loss to a goal wt of 85-91.4 kg (187-201lb) at graduation from cardiac rehab.    Pt to describe and follow a basic diabetic meal plan. Monitor and Evaluate progress toward nutrition goal with team.

## 2011-11-30 ENCOUNTER — Encounter (HOSPITAL_COMMUNITY)
Admission: RE | Admit: 2011-11-30 | Discharge: 2011-11-30 | Disposition: A | Discharge status: Home / Self Care | Payer: Medicare Other | Source: Ambulatory Visit | Attending: Cardiology | Admitting: Cardiology

## 2011-12-02 ENCOUNTER — Encounter (HOSPITAL_COMMUNITY): Payer: Medicare Other

## 2011-12-05 ENCOUNTER — Encounter (HOSPITAL_COMMUNITY)
Admission: RE | Admit: 2011-12-05 | Discharge: 2011-12-05 | Disposition: A | Discharge status: Home / Self Care | Payer: Medicare Other | Source: Ambulatory Visit | Attending: Cardiology | Admitting: Cardiology

## 2011-12-07 ENCOUNTER — Encounter (HOSPITAL_COMMUNITY)
Admission: RE | Admit: 2011-12-07 | Discharge: 2011-12-07 | Disposition: A | Discharge status: Home / Self Care | Payer: Medicare Other | Source: Ambulatory Visit | Attending: Cardiology | Admitting: Cardiology

## 2011-12-07 NOTE — Progress Notes (Signed)
Reviewed home exercise with pt today.  Pt plans to use treadmill, elliptical, and stepper at nearby [facility] for exercise.  Reviewed THR, pulse, RPE, sign and symptoms, and when to call 911 or MD.  Pt voiced understanding. Robert [name] [facility] 12/07/11 1237

## 2011-12-09 ENCOUNTER — Encounter (HOSPITAL_COMMUNITY)
Admission: RE | Admit: 2011-12-09 | Discharge: 2011-12-09 | Disposition: A | Discharge status: Home / Self Care | Payer: Medicare Other | Source: Ambulatory Visit | Attending: Cardiology | Admitting: Cardiology

## 2011-12-12 ENCOUNTER — Encounter (HOSPITAL_COMMUNITY)
Admission: RE | Admit: 2011-12-12 | Discharge: 2011-12-12 | Disposition: A | Discharge status: Home / Self Care | Payer: Medicare Other | Source: Ambulatory Visit | Attending: Cardiology | Admitting: Cardiology

## 2011-12-14 ENCOUNTER — Encounter (HOSPITAL_COMMUNITY)
Admission: RE | Admit: 2011-12-14 | Discharge: 2011-12-14 | Disposition: A | Discharge status: Home / Self Care | Payer: Medicare Other | Source: Ambulatory Visit | Attending: Cardiology | Admitting: Cardiology

## 2011-12-16 ENCOUNTER — Encounter (HOSPITAL_COMMUNITY)
Admission: RE | Admit: 2011-12-16 | Discharge: 2011-12-16 | Disposition: A | Discharge status: Home / Self Care | Payer: Medicare Other | Source: Ambulatory Visit | Attending: Cardiology | Admitting: Cardiology

## 2011-12-16 DIAGNOSIS — I214 Non-ST elevation (NSTEMI) myocardial infarction: Secondary | ICD-10-CM | POA: Insufficient documentation

## 2011-12-16 DIAGNOSIS — I10 Essential (primary) hypertension: Secondary | ICD-10-CM | POA: Insufficient documentation

## 2011-12-16 DIAGNOSIS — E785 Hyperlipidemia, unspecified: Secondary | ICD-10-CM | POA: Insufficient documentation

## 2011-12-16 DIAGNOSIS — Z888 Allergy status to other drugs, medicaments and biological substances status: Secondary | ICD-10-CM | POA: Insufficient documentation

## 2011-12-16 DIAGNOSIS — Z794 Long term (current) use of insulin: Secondary | ICD-10-CM | POA: Insufficient documentation

## 2011-12-16 DIAGNOSIS — I2510 Atherosclerotic heart disease of native coronary artery without angina pectoris: Secondary | ICD-10-CM | POA: Insufficient documentation

## 2011-12-16 DIAGNOSIS — G4730 Sleep apnea, unspecified: Secondary | ICD-10-CM | POA: Insufficient documentation

## 2011-12-16 DIAGNOSIS — Z7982 Long term (current) use of aspirin: Secondary | ICD-10-CM | POA: Insufficient documentation

## 2011-12-16 DIAGNOSIS — Z880 Allergy status to penicillin: Secondary | ICD-10-CM | POA: Insufficient documentation

## 2011-12-16 DIAGNOSIS — Z9861 Coronary angioplasty status: Secondary | ICD-10-CM | POA: Insufficient documentation

## 2011-12-16 DIAGNOSIS — E7800 Pure hypercholesterolemia, unspecified: Secondary | ICD-10-CM | POA: Insufficient documentation

## 2011-12-16 DIAGNOSIS — K219 Gastro-esophageal reflux disease without esophagitis: Secondary | ICD-10-CM | POA: Insufficient documentation

## 2011-12-16 DIAGNOSIS — Z5189 Encounter for other specified aftercare: Secondary | ICD-10-CM | POA: Insufficient documentation

## 2011-12-19 ENCOUNTER — Encounter (HOSPITAL_COMMUNITY)
Admission: RE | Admit: 2011-12-19 | Discharge: 2011-12-19 | Disposition: A | Discharge status: Home / Self Care | Payer: Medicare Other | Source: Ambulatory Visit | Attending: Cardiology | Admitting: Cardiology

## 2011-12-21 ENCOUNTER — Encounter (HOSPITAL_COMMUNITY): Payer: Medicare Other

## 2011-12-23 ENCOUNTER — Encounter (HOSPITAL_COMMUNITY)
Admission: RE | Admit: 2011-12-23 | Discharge: 2011-12-23 | Disposition: A | Discharge status: Home / Self Care | Payer: Medicare Other | Source: Ambulatory Visit | Attending: Cardiology | Admitting: Cardiology

## 2011-12-26 ENCOUNTER — Encounter (HOSPITAL_COMMUNITY)
Admission: RE | Admit: 2011-12-26 | Discharge: 2011-12-26 | Disposition: A | Discharge status: Home / Self Care | Payer: Medicare Other | Source: Ambulatory Visit | Attending: Cardiology | Admitting: Cardiology

## 2011-12-27 ENCOUNTER — Encounter: Payer: Self-pay | Admitting: Cardiology

## 2011-12-27 ENCOUNTER — Ambulatory Visit (INDEPENDENT_AMBULATORY_CARE_PROVIDER_SITE_OTHER): Payer: Medicare Other | Admitting: Cardiology

## 2011-12-27 VITALS — BP 110/58 | HR 64 | Ht 73.0 in | Wt 210.8 lb

## 2011-12-27 DIAGNOSIS — I10 Essential (primary) hypertension: Secondary | ICD-10-CM

## 2011-12-27 DIAGNOSIS — E785 Hyperlipidemia, unspecified: Secondary | ICD-10-CM

## 2011-12-27 DIAGNOSIS — I214 Non-ST elevation (NSTEMI) myocardial infarction: Secondary | ICD-10-CM

## 2011-12-27 DIAGNOSIS — E119 Type 2 diabetes mellitus without complications: Secondary | ICD-10-CM

## 2011-12-27 DIAGNOSIS — I2510 Atherosclerotic heart disease of native coronary artery without angina pectoris: Secondary | ICD-10-CM

## 2011-12-27 DIAGNOSIS — E782 Mixed hyperlipidemia: Secondary | ICD-10-CM

## 2011-12-27 NOTE — Assessment & Plan Note (Signed)
Blood pressure control is excellent. Continue on his current medical therapy.

## 2011-12-27 NOTE — Patient Instructions (Signed)
We will schedule you for fasting lab work.  Continue your medication  I will see you again in 6 months.

## 2011-12-27 NOTE — Progress Notes (Signed)
Minette Brine Date of Birth: October 22, 1946 Medical Record #[id number]  History of Present Illness: Mr. Fedora is seen today for a followup visit.  He has had a  NSTEMI with DES to the proximal RCA following flow wire evaluation at the end of November. EF was normal. Peak troponin was 0.47. He also had a negative CTA of the chest. He is committed to Plavix for one year.  He comes in today. He is doing well. He is tolerating his medicines. No chest pain or shortness of breath. [facility]  Current Outpatient Prescriptions on File Prior to Visit  Medication Sig Dispense Refill  . aspirin 81 MG tablet Take 81 mg by mouth daily.        . clopidogrel (PLAVIX) 75 MG tablet Take 1 tablet (75 mg total) by mouth daily with breakfast.  90 tablet  3  . levocetirizine (XYZAL) 5 MG tablet       . metoprolol tartrate (LOPRESSOR) 25 MG tablet Take 0.5 tablets (12.5 mg total) by mouth 2 (two) times daily.  90 tablet  3  . mometasone (NASONEX) 50 MCG/ACT nasal spray Place 2 sprays into the nose daily.  17 g  3  . nitroGLYCERIN (NITROSTAT) 0.4 MG SL tablet Place 1 tablet (0.4 mg total) under the tongue every 5 (five) minutes as needed for chest pain.  30 tablet  1  . pantoprazole (PROTONIX) 40 MG tablet Take 1 tablet (40 mg total) by mouth daily.  90 tablet  3  . rosuvastatin (CRESTOR) 40 MG tablet Take 1 tablet (40 mg total) by mouth daily.  90 tablet  3  . traMADol (ULTRAM) 50 MG tablet Take 50 mg by mouth every 6 (six) hours as needed.          Allergies  Allergen Reactions  . Penicillins     itching  . Sumatriptan     REACTION: sob, ha, scalp sensitivity    Past Medical History  Diagnosis Date  . GERD (gastroesophageal reflux disease)   . Cancer   . Migraines   . CAD (coronary artery disease) 10/10/2011    NSTEMI with DES to the proximal RCA following flow wire evaluation. EF is normal.   . Hyperlipidemia     Past Surgical History    Procedure Date  . Back surgery   . Shoulder surgery   . Hand surgery   . Coronary stent placement 10/13/2011    DES to the proximal RCA.     History  Smoking status  . Former Smoker  . Types: Cigarettes  Smokeless tobacco  . Never Used  Comment: Age 60    History  Alcohol Use  . 0.0 oz/week  . 5-6 Cans of beer per week    Family History  Problem Relation Age of Onset  . Cancer Mother   . Heart disease Father     heart attack     Review of Systems: The review of systems is per the HPI.  All other systems were reviewed and are negative.  Physical Exam: BP 110/58  Pulse 64  Ht 6\' 1"  (1.854 m)  Wt 210 lb 12.8 oz (95.618 kg)  BMI 27.81 kg/m2 Patient is very pleasant and in no acute distress. Skin is warm and dry. Color is normal.  HEENT is unremarkable. Normocephalic/atraumatic. PERRL. Sclera are nonicteric. Neck is supple. No masses. No JVD. Lungs are clear. Cardiac exam shows a regular rate and rhythm.  Abdomen is soft. Extremities are without edema. Gait and ROM are intact. No gross neurologic deficits noted.   LABORATORY DATA:   Assessment / Plan:

## 2011-12-27 NOTE — Assessment & Plan Note (Signed)
Status post stenting of the right coronary with a drug-eluting stent at the end of November. We will continue antiplatelet therapy for one year at which point his Plavix can be discontinued. I will plan a followup again in 6 months.

## 2011-12-27 NOTE — Assessment & Plan Note (Signed)
He is on maximal Crestor therapy. We will schedule him for fasting lab work.

## 2011-12-28 ENCOUNTER — Encounter (HOSPITAL_COMMUNITY)
Admission: RE | Admit: 2011-12-28 | Discharge: 2011-12-28 | Disposition: A | Discharge status: Home / Self Care | Payer: Medicare Other | Source: Ambulatory Visit | Attending: Cardiology | Admitting: Cardiology

## 2011-12-30 ENCOUNTER — Other Ambulatory Visit: Payer: Self-pay

## 2011-12-30 ENCOUNTER — Other Ambulatory Visit (INDEPENDENT_AMBULATORY_CARE_PROVIDER_SITE_OTHER): Payer: Medicare Other | Admitting: *Deleted

## 2011-12-30 ENCOUNTER — Encounter (HOSPITAL_COMMUNITY)
Admission: RE | Admit: 2011-12-30 | Discharge: 2011-12-30 | Disposition: A | Discharge status: Home / Self Care | Payer: Medicare Other | Source: Ambulatory Visit | Attending: Cardiology | Admitting: Cardiology

## 2011-12-30 DIAGNOSIS — E785 Hyperlipidemia, unspecified: Secondary | ICD-10-CM

## 2011-12-30 DIAGNOSIS — E119 Type 2 diabetes mellitus without complications: Secondary | ICD-10-CM

## 2011-12-30 DIAGNOSIS — I2510 Atherosclerotic heart disease of native coronary artery without angina pectoris: Secondary | ICD-10-CM

## 2011-12-30 LAB — LIPID PANEL
Cholesterol: 178 mg/dL (ref 0–200)
HDL: 55.5 mg/dL (ref >39.00)
LDL Cholesterol: 97 mg/dL (ref 0–99)
VLDL: 25.4 mg/dL (ref 0.0–40.0)

## 2011-12-30 LAB — HEPATIC FUNCTION PANEL
AST: 24 U/L (ref 0–37)
Alkaline Phosphatase: 53 U/L (ref 39–117)
Total Bilirubin: 0.6 mg/dL (ref 0.3–1.2)

## 2011-12-30 LAB — BASIC METABOLIC PANEL
BUN: 13 mg/dL (ref 6–23)
Calcium: 9.5 mg/dL (ref 8.4–10.5)
GFR: 83.78 mL/min (ref >60.00)
Potassium: 4.4 mEq/L (ref 3.5–5.1)

## 2012-01-02 ENCOUNTER — Encounter (HOSPITAL_COMMUNITY): Payer: Medicare Other

## 2012-01-03 ENCOUNTER — Telehealth: Payer: Self-pay | Admitting: Cardiology

## 2012-01-03 MED ORDER — EZETIMIBE 10 MG PO TABS: 10.0000 mg | ORAL_TABLET | Freq: Every day | ORAL | Status: DC

## 2012-01-03 NOTE — Telephone Encounter (Signed)
Refill  F/U  Patient received refill for 30 day supply of Zetia RX, insurance only pays for 90 day supply.  Please resubmitt Prescription with 90 Day Supply.  Verified Pharmacy :  [facility] [address] ([place])  Patient can be reached at hm# should you need more info  Patient Sig: Take 1 tablet (10 mg total) by mouth daily.   Ordered on: 01/03/2012   Authorized by: Swaziland, PETER   Dispense: 30 tablet

## 2012-01-03 NOTE — Telephone Encounter (Signed)
Pt made aware script has been sent to the pharm.

## 2012-01-03 NOTE — Telephone Encounter (Signed)
New msg Patient said he is supposed to start taking another med in addition to crestor. He said [facility] [address] [facility] and they do not have any med for him. Please call

## 2012-01-04 ENCOUNTER — Encounter (HOSPITAL_COMMUNITY)
Admission: RE | Admit: 2012-01-04 | Discharge: 2012-01-04 | Disposition: A | Discharge status: Home / Self Care | Payer: Medicare Other | Source: Ambulatory Visit | Attending: Cardiology | Admitting: Cardiology

## 2012-01-06 ENCOUNTER — Encounter (HOSPITAL_COMMUNITY)
Admission: RE | Admit: 2012-01-06 | Discharge: 2012-01-06 | Disposition: A | Discharge status: Home / Self Care | Payer: Medicare Other | Source: Ambulatory Visit | Attending: Cardiology | Admitting: Cardiology

## 2012-01-09 ENCOUNTER — Encounter (HOSPITAL_COMMUNITY)
Admission: RE | Admit: 2012-01-09 | Discharge: 2012-01-09 | Disposition: A | Discharge status: Home / Self Care | Payer: Medicare Other | Source: Ambulatory Visit | Attending: Cardiology | Admitting: Cardiology

## 2012-01-11 ENCOUNTER — Encounter (HOSPITAL_COMMUNITY)
Admission: RE | Admit: 2012-01-11 | Discharge: 2012-01-11 | Disposition: A | Discharge status: Home / Self Care | Payer: Medicare Other | Source: Ambulatory Visit | Attending: Cardiology | Admitting: Cardiology

## 2012-01-13 ENCOUNTER — Encounter (HOSPITAL_COMMUNITY)
Admission: RE | Admit: 2012-01-13 | Discharge: 2012-01-13 | Disposition: A | Discharge status: Home / Self Care | Payer: Medicare Other | Source: Ambulatory Visit | Attending: Cardiology | Admitting: Cardiology

## 2012-01-13 DIAGNOSIS — G4730 Sleep apnea, unspecified: Secondary | ICD-10-CM | POA: Insufficient documentation

## 2012-01-13 DIAGNOSIS — I214 Non-ST elevation (NSTEMI) myocardial infarction: Secondary | ICD-10-CM | POA: Insufficient documentation

## 2012-01-13 DIAGNOSIS — Z888 Allergy status to other drugs, medicaments and biological substances status: Secondary | ICD-10-CM | POA: Insufficient documentation

## 2012-01-13 DIAGNOSIS — Z794 Long term (current) use of insulin: Secondary | ICD-10-CM | POA: Insufficient documentation

## 2012-01-13 DIAGNOSIS — I10 Essential (primary) hypertension: Secondary | ICD-10-CM | POA: Insufficient documentation

## 2012-01-13 DIAGNOSIS — Z5189 Encounter for other specified aftercare: Secondary | ICD-10-CM | POA: Insufficient documentation

## 2012-01-13 DIAGNOSIS — Z9861 Coronary angioplasty status: Secondary | ICD-10-CM | POA: Insufficient documentation

## 2012-01-13 DIAGNOSIS — K219 Gastro-esophageal reflux disease without esophagitis: Secondary | ICD-10-CM | POA: Insufficient documentation

## 2012-01-13 DIAGNOSIS — E7800 Pure hypercholesterolemia, unspecified: Secondary | ICD-10-CM | POA: Insufficient documentation

## 2012-01-13 DIAGNOSIS — E785 Hyperlipidemia, unspecified: Secondary | ICD-10-CM | POA: Insufficient documentation

## 2012-01-13 DIAGNOSIS — I2510 Atherosclerotic heart disease of native coronary artery without angina pectoris: Secondary | ICD-10-CM | POA: Insufficient documentation

## 2012-01-13 DIAGNOSIS — Z7982 Long term (current) use of aspirin: Secondary | ICD-10-CM | POA: Insufficient documentation

## 2012-01-13 DIAGNOSIS — Z880 Allergy status to penicillin: Secondary | ICD-10-CM | POA: Insufficient documentation

## 2012-01-16 ENCOUNTER — Encounter (HOSPITAL_COMMUNITY)
Admission: RE | Admit: 2012-01-16 | Discharge: 2012-01-16 | Disposition: A | Discharge status: Home / Self Care | Payer: Medicare Other | Source: Ambulatory Visit | Attending: Cardiology | Admitting: Cardiology

## 2012-01-18 ENCOUNTER — Encounter (HOSPITAL_COMMUNITY)
Admission: RE | Admit: 2012-01-18 | Discharge: 2012-01-18 | Disposition: A | Discharge status: Home / Self Care | Payer: Medicare Other | Source: Ambulatory Visit | Attending: Cardiology | Admitting: Cardiology

## 2012-01-20 ENCOUNTER — Encounter (HOSPITAL_COMMUNITY)
Admission: RE | Admit: 2012-01-20 | Discharge: 2012-01-20 | Disposition: A | Discharge status: Home / Self Care | Payer: Medicare Other | Source: Ambulatory Visit | Attending: Cardiology | Admitting: Cardiology

## 2012-01-23 ENCOUNTER — Encounter (HOSPITAL_COMMUNITY): Payer: Medicare Other

## 2012-01-25 ENCOUNTER — Encounter (HOSPITAL_COMMUNITY): Admission: RE | Admit: 2012-01-25 | Payer: Medicare Other | Source: Ambulatory Visit

## 2012-01-27 ENCOUNTER — Encounter (HOSPITAL_COMMUNITY): Payer: Medicare Other

## 2012-01-30 ENCOUNTER — Ambulatory Visit (HOSPITAL_COMMUNITY): Payer: Medicare Other

## 2012-02-01 ENCOUNTER — Telehealth (HOSPITAL_COMMUNITY): Payer: Self-pay | Admitting: Cardiac Rehabilitation

## 2012-02-01 ENCOUNTER — Ambulatory Visit (HOSPITAL_COMMUNITY): Payer: Medicare Other

## 2012-02-01 NOTE — Progress Notes (Signed)
Cardiac Rehabilitation Program Progress Report   Orientation:  10/20/2011 Graduate Date:   Discharge Date:  02/01/2012 # of sessions completed: 31  Cardiologist: Swaziland Family MD:  Rivka Safer Time:  0815  A.  Exercise Program:  Tolerates exercise @ 4.6 METS for 30 minutes and Discharged  B.  Mental Health:    C.  Education/Instruction/Skills  Accurately checks own pulse, Knows THR for exercise, Uses Perceived Exertion Scale and Attended 10 education classes    D.  Nutrition/Weight Control/Body Composition:  Patient has lost 1.4 kg BMI 27.3   *This section completed by Mickle Plumb, Andres Shad, RD, LDN, CDE  E.  Blood Lipids    Lab Results  Component Value Date   CHOL 178 12/30/2011     Lab Results  Component Value Date   TRIG 127.0 12/30/2011     Lab Results  Component Value Date   HDL 55.50 12/30/2011     Lab Results  Component Value Date   CHOLHDL 3 12/30/2011     Lab Results  Component Value Date   LDLDIRECT 137.0 03/31/2010      F.  Lifestyle Changes:    G.  Symptoms noted with exercise:  Asymptomatic  Report Completed By:  Dayton Martes   Comments:  Electronically signed by Harriett Sine MS on Wednesday February 01 2012 at 1214

## 2012-02-02 NOTE — Progress Notes (Signed)
Addendum to Nutrition Section of Cardiac Rehab Program Progress Report  Pt following a step 1 Therapeutic Lifestyle Changes diet upon admission to Cardiac Rehab. No post-rehab data available to evaluate.

## 2012-02-03 ENCOUNTER — Ambulatory Visit (HOSPITAL_COMMUNITY): Payer: Medicare Other

## 2012-02-06 ENCOUNTER — Ambulatory Visit (HOSPITAL_COMMUNITY): Payer: Medicare Other

## 2012-02-08 ENCOUNTER — Ambulatory Visit (HOSPITAL_COMMUNITY): Payer: Medicare Other

## 2012-02-10 ENCOUNTER — Ambulatory Visit (HOSPITAL_COMMUNITY): Payer: Medicare Other

## 2012-02-13 ENCOUNTER — Ambulatory Visit (HOSPITAL_COMMUNITY): Payer: Medicare Other

## 2012-02-14 NOTE — Progress Notes (Signed)
Pt completed cardiac rehab program attending 31/36 sessions. Pt exited program early because he felt his stamina and physical ability were restored and with improved weather conditions he prefers to exercise on his own outside. Since pt abruptly left program, final assessments were not completed.   Pt had excellent participation in both exercise and education classes while at cardiac rehab. Pt VSS, telemetry-NSR.  Pt has made positive lifestyle changes and should be commended for his efforts.  Pt will need reinforcement to continue these changes.  Pt was a pleasure to work with, thank you for the referral.

## 2012-02-15 ENCOUNTER — Ambulatory Visit (HOSPITAL_COMMUNITY): Payer: Medicare Other

## 2012-02-17 ENCOUNTER — Ambulatory Visit (HOSPITAL_COMMUNITY): Payer: Medicare Other

## 2012-02-20 ENCOUNTER — Ambulatory Visit (HOSPITAL_COMMUNITY): Payer: Medicare Other

## 2012-02-22 ENCOUNTER — Ambulatory Visit (HOSPITAL_COMMUNITY): Payer: Medicare Other

## 2012-02-24 ENCOUNTER — Ambulatory Visit (HOSPITAL_COMMUNITY): Payer: Medicare Other

## 2012-05-07 ENCOUNTER — Ambulatory Visit (INDEPENDENT_AMBULATORY_CARE_PROVIDER_SITE_OTHER): Payer: Medicare Other | Admitting: Internal Medicine

## 2012-05-07 ENCOUNTER — Encounter: Payer: Self-pay | Admitting: Internal Medicine

## 2012-05-07 VITALS — BP 120/58 | HR 54 | Temp 98.4°F | Wt 207.0 lb

## 2012-05-07 DIAGNOSIS — H9220 Otorrhagia, unspecified ear: Secondary | ICD-10-CM

## 2012-05-07 DIAGNOSIS — R51 Headache: Secondary | ICD-10-CM

## 2012-05-07 DIAGNOSIS — H9210 Otorrhea, unspecified ear: Secondary | ICD-10-CM

## 2012-05-07 MED ORDER — GABAPENTIN 100 MG PO CAPS: ORAL_CAPSULE | ORAL | Status: DC

## 2012-05-07 NOTE — Progress Notes (Signed)
  Subjective:    Patient ID: Robert [name], male    DOB: 03-28-1946, 66 y.o.   MRN: [id number]  HPI He's had itching in the right ear for 2-3 weeks. Intermittently he'll notice dried blood on his finger when he checks his ear.He denies Q tip use  On 6/23 he noticed blood on the pillow case. He has a past history of recurrent ear infections for which he was followed by laryngologist. There's been no associated tinnitus or hearing loss He has not had epistaxis, hemoptysis, hematuria, melena, or rectal bleeding.   He describes burning pressure type headache since 6/20. This is localized to the areas of the eyes, forehead, and crown. Tramadol has been of some benefit. He has a history of migraine headaches. This has been associated with fatigue    Review of Systems Nausea/vomiting: no  Photophobia/phonophobia: to light Tearing of eyes:no Sinus pain/pressure: no  Fever: no Neck pain/stiffness: no  Vision/speech/swallow/hearing difficulty:no  Focal weakness/numbness:no  Altered mental status: no  Trauma: no New type of headache:similar to migraines Anticoagulant use: ASA 81 mg only        Objective:   Physical Exam  Gen appearance:  Well-nourished, in no distress Eyes: Extraocular motion intact, field of vision normal, vision grossly intact, no nystagmus ENT: L canal &  tympanic  membrane normal, tuning fork exam normal, hearing grossly normal to whisper at 6 feet. There is a cerumen impaction on the right which partially occludes the canal Neck: decreased lateral range of motion, no masses, normal thyroid Cardiovascular: Rate and rhythm normal; no murmurs, gallops .S 4. Muscle skeletal: Range of motion, tone, &  strength normal Neuro:no cranial nerve deficit, deep tendon  reflexes normal, gait normal. Rhomberg testing and finger to nose testing normal  Lymph: No cervical or axillary LA Skin: Warm and dry without suspicious lesions or rashes Psych: no anxiety or mood change. Normally  interactive and cooperative.          Assessment & Plan:  #1 otic bleeding by history; a partial cerumen impaction impedes full visualization of the right tympanic membrane. By history he's had recurrent otitis externa.  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 Bleeding dyscrasia is not suggested  #2 gabapentin with titration as needed

## 2012-05-07 NOTE — Patient Instructions (Addendum)
Please keep a diary of your headaches . Document  each occurrence on the calendar with notation of : #1 any prodrome ( any non headache symptom such as marked fatigue,visual changes, ,etc ) which precedes actual headache ; #2) severity on 1-10 scale; #3) any triggers ( food/ drink,enviromenntal or weather changes ,physical or emotional stress) in 8-12 hour period prior to the headache; & #4) response to any medications or other intervention. Please review "Headache" @ [facility] for additional information.    Assess response to the gabapentin one every 8 hours as needed. If it is partially beneficial, it can be increased up to a total of 3 pills every 8 hours as needed. This increase of 1 pill each dose  should take place over 72 hours at least.

## 2012-05-08 ENCOUNTER — Telehealth: Payer: Self-pay | Admitting: Internal Medicine

## 2012-05-08 NOTE — Telephone Encounter (Signed)
This patient was just seen yesterday, 05/07/12, today is Tuesday morning, 05/08/12.  I have not yet gotten to this days referrals, but I will process this patient's right now to [facility] and ask for an urgent appointment.  I will notify patient of appointment date & time.

## 2012-05-08 NOTE — Telephone Encounter (Signed)
Renee please advise.

## 2012-05-08 NOTE — Telephone Encounter (Signed)
Caller: Sheila/Spouse; Phone Number: [phone]; Message from caller: Wife calling because office was suppose to schedule patient with Dr Flo Shanks but no appointment has been made yet.

## 2012-05-09 NOTE — Telephone Encounter (Signed)
Patient with 10 am appointment today with Dr.Wolicki

## 2012-06-01 ENCOUNTER — Other Ambulatory Visit: Payer: Medicare Other

## 2012-06-08 ENCOUNTER — Other Ambulatory Visit (INDEPENDENT_AMBULATORY_CARE_PROVIDER_SITE_OTHER): Payer: Medicare Other

## 2012-06-08 DIAGNOSIS — E785 Hyperlipidemia, unspecified: Secondary | ICD-10-CM

## 2012-06-08 LAB — HEPATIC FUNCTION PANEL
AST: 24 U/L (ref 0–37)
Albumin: 4.1 g/dL (ref 3.5–5.2)
Total Bilirubin: 0.6 mg/dL (ref 0.3–1.2)

## 2012-06-08 LAB — LIPID PANEL
Cholesterol: 130 mg/dL (ref 0–200)
HDL: 69.3 mg/dL (ref >39.00)
LDL Cholesterol: 46 mg/dL (ref 0–99)
Triglycerides: 73 mg/dL (ref 0.0–149.0)
VLDL: 14.6 mg/dL (ref 0.0–40.0)

## 2012-07-11 ENCOUNTER — Encounter: Payer: Self-pay | Admitting: Cardiology

## 2012-07-11 ENCOUNTER — Ambulatory Visit (INDEPENDENT_AMBULATORY_CARE_PROVIDER_SITE_OTHER): Payer: Medicare Other | Admitting: Cardiology

## 2012-07-11 VITALS — BP 118/62 | HR 53 | Ht 72.0 in | Wt 208.0 lb

## 2012-07-11 DIAGNOSIS — E785 Hyperlipidemia, unspecified: Secondary | ICD-10-CM

## 2012-07-11 DIAGNOSIS — I2510 Atherosclerotic heart disease of native coronary artery without angina pectoris: Secondary | ICD-10-CM

## 2012-07-11 MED ORDER — NITROGLYCERIN 0.4 MG SL SUBL: 0.4000 mg | SUBLINGUAL_TABLET | SUBLINGUAL | Status: DC | PRN

## 2012-07-11 NOTE — Progress Notes (Signed)
Robert [name] Date of Birth: 1946/02/14 Medical Record #[id number]  History of Present Illness: Robert [name] is seen today for a followup visit.  He has had a  NSTEMI with DES to the proximal RCA in November. EF was normal. Peak troponin was 0.47. He also had a negative CTA of the chest. He is committed to Plavix for one year.  On followup today he states he is feeling very well. He is active playing golf and doing his yard work. [facility]. He does report that he is going to need to a left inguinal hernia repair.  Current Outpatient Prescriptions on File Prior to Visit  Medication Sig Dispense Refill  . aspirin 81 MG tablet Take 81 mg by mouth daily.        . clopidogrel (PLAVIX) 75 MG tablet Take 1 tablet (75 mg total) by mouth daily with breakfast.  90 tablet  3  . ezetimibe (ZETIA) 10 MG tablet Take 1 tablet (10 mg total) by mouth daily.  90 tablet  2  . gabapentin (NEURONTIN) 100 MG capsule One every 8 hours as needed titrate as directed  30 capsule  3  . levocetirizine (XYZAL) 5 MG tablet       . metoprolol tartrate (LOPRESSOR) 25 MG tablet Take 0.5 tablets (12.5 mg total) by mouth 2 (two) times daily.  90 tablet  3  . mometasone (NASONEX) 50 MCG/ACT nasal spray Place 2 sprays into the nose daily.  17 g  3  . pantoprazole (PROTONIX) 40 MG tablet Take 1 tablet (40 mg total) by mouth daily.  90 tablet  3  . rosuvastatin (CRESTOR) 40 MG tablet Take 1 tablet (40 mg total) by mouth daily.  90 tablet  3  . traMADol (ULTRAM) 50 MG tablet Take 50 mg by mouth every 6 (six) hours as needed.        Marland Kitchen DISCONTD: nitroGLYCERIN (NITROSTAT) 0.4 MG SL tablet Place 1 tablet (0.4 mg total) under the tongue every 5 (five) minutes as needed for chest pain.  30 tablet  1    Allergies  Allergen Reactions  . Penicillins     itching  . Sumatriptan     REACTION: sob, headache scalp sensitivity    Past Medical History  Diagnosis Date  . GERD  (gastroesophageal reflux disease)   . 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 Migraines   . CAD (coronary artery disease) 10/10/2011    NSTEMI with DES to the proximal RCA following flow wire evaluation. EF is normal.   . Hyperlipidemia     Past Surgical History  Procedure Date  . Back surgery   . Shoulder surgery   . Hand surgery   . Coronary stent placement 10/13/2011    DES to the proximal RCA. Dr Swaziland    History  Smoking status  . Former Smoker  . Types: Cigarettes  . Quit date: 11/14/1982  Smokeless tobacco  . Never Used  Comment: Age 19    History  Alcohol Use  . 0.0 oz/week  . 5-6 Cans of beer per week    Family History  Problem Relation Age of Onset  . Cancer Mother     lung  . Heart disease Father     heart attack     Review of Systems: The review of systems is per the HPI.  All other systems were reviewed and are negative.  Physical Exam: BP 118/62  Pulse 53  Ht 6' (1.829 m)  Wt 208 lb (94.348 kg)  BMI 28.21 kg/m2  SpO2 95% Patient is  pleasant and in no acute distress. Skin is warm and dry. Color is normal.  HEENT is unremarkable. Normocephalic/atraumatic. PERRL. Sclera are nonicteric. Neck is supple. No masses. No JVD. Lungs are clear. Cardiac exam shows a regular rate and rhythm.normal S1 and S2 without gallop or murmur. Abdomen is soft. No masses or bruits.Extremities are without edema.pedal pulses are good. Gait and ROM are intact. No gross neurologic deficits noted.   LABORATORY DATA: Reviewed laboratory data from July. Total cholesterol 1:30, triglycerides 73, HDL 69, LDL 46.  Assessment / Plan: 1. Her coronary disease status post drug-eluting stent to the proximal RCA in November 2012. Recommend continued aspirin and Plavix for total of one year. After November 26 he can stop his Plavix and he will be cleared for inguinal hernia surgery at that time.I will followup again in 6 months.  2. Hyperlipidemia. Under excellent control on  combination of Crestor and Zetia.  3. Hypertension well controlled.

## 2012-07-11 NOTE — Patient Instructions (Signed)
You can stop Plavix after Nov. 26th. You will be cleared for hernia surgery after that.  Continue your other medication  I will see you again in 6 months.

## 2012-07-18 ENCOUNTER — Other Ambulatory Visit: Payer: Self-pay | Admitting: Cardiology

## 2012-07-18 MED ORDER — NITROGLYCERIN 0.4 MG SL SUBL: 0.4000 mg | SUBLINGUAL_TABLET | SUBLINGUAL | Status: DC | PRN

## 2012-07-20 ENCOUNTER — Telehealth: Payer: Self-pay | Admitting: Cardiology

## 2012-07-20 ENCOUNTER — Other Ambulatory Visit: Payer: Self-pay

## 2012-07-20 MED ORDER — NITROGLYCERIN 0.4 MG SL SUBL: 0.4000 mg | SUBLINGUAL_TABLET | SUBLINGUAL | Status: DC | PRN

## 2012-07-20 NOTE — Telephone Encounter (Signed)
Error

## 2012-08-24 ENCOUNTER — Ambulatory Visit (INDEPENDENT_AMBULATORY_CARE_PROVIDER_SITE_OTHER): Payer: Medicare Other | Admitting: General Surgery

## 2012-08-24 ENCOUNTER — Encounter (INDEPENDENT_AMBULATORY_CARE_PROVIDER_SITE_OTHER): Payer: Self-pay | Admitting: General Surgery

## 2012-08-24 VITALS — BP 160/66 | HR 64 | Temp 97.4°F | Resp 16 | Ht 73.0 in | Wt 208.0 lb

## 2012-08-24 DIAGNOSIS — K4090 Unilateral inguinal hernia, without obstruction or gangrene, not specified as recurrent: Secondary | ICD-10-CM

## 2012-08-24 MED ORDER — TAMSULOSIN HCL 0.4 MG PO CAPS: 0.4000 mg | ORAL_CAPSULE | Freq: Every day | ORAL | Status: DC

## 2012-08-24 NOTE — Patient Instructions (Addendum)
Stop Plavix 7 days before the surgery. Stop aspirin 5 days before surgery. Start Flomax 2 days before surgery then take it 5 days after surgery.  We will call you to schedule the surgery.

## 2012-08-24 NOTE — Progress Notes (Signed)
Patient ID: Robert [name], male   DOB: January 19, 1946, 66 y.o.   MRN: [id number]  Chief Complaint  Patient presents with  . Pre-op Exam    eval hernia - discuss Sx    HPI Robert [name] is a 66 y.o. male.   HPI  He is well known to me. I saw him over a year ago because of a left inguinal hernia. A few months after I saw him, he suffered a myocardial infarction and had a stent placed. It is a drug-eluting stent and he'll need to be on his Plavix and aspirin until November 26. At that time, he would be able to be off of it for the surgery. He's been seen by his cardiologist Dr. Swaziland who feels he would be okay to proceed with the surgery after that time. [name] feels the hernia is slightly larger.  Past Medical History  Diagnosis Date  . GERD (gastroesophageal reflux disease)   . 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 Migraines   . CAD (coronary artery disease) 10/10/2011    NSTEMI with DES to the proximal RCA following flow wire evaluation. EF is normal.   . Hyperlipidemia     Past Surgical History  Procedure Date  . Back surgery   . Shoulder surgery   . Hand surgery   . Coronary stent placement 10/13/2011    DES to the proximal RCA. Dr Swaziland    Family History  Problem Relation Age of Onset  . Cancer Mother     lung  . Heart disease Father     heart attack   . Cancer Father     colon    Social History History  Substance Use Topics  . Smoking status: Former Smoker    Types: Cigarettes    Quit date: 11/14/1982  . Smokeless tobacco: Never Used   Comment: Age 28  . Alcohol Use: 0.0 oz/week    5-6 Cans of beer per week    Allergies  Allergen Reactions  . Penicillins     itching  . Sumatriptan     REACTION: sob, headache scalp sensitivity    Current Outpatient Prescriptions  Medication Sig Dispense Refill  . aspirin 81 MG tablet Take 81 mg by mouth daily.        . clopidogrel (PLAVIX) 75 MG tablet Take 1 tablet (75 mg total) by mouth daily with breakfast.  90  tablet  3  . ezetimibe (ZETIA) 10 MG tablet Take 1 tablet (10 mg total) by mouth daily.  90 tablet  2  . gabapentin (NEURONTIN) 100 MG capsule One every 8 hours as needed titrate as directed  30 capsule  3  . levocetirizine (XYZAL) 5 MG tablet       . metoprolol tartrate (LOPRESSOR) 25 MG tablet Take 0.5 tablets (12.5 mg total) by mouth 2 (two) times daily.  90 tablet  3  . mometasone (NASONEX) 50 MCG/ACT nasal spray Place 2 sprays into the nose daily.  17 g  3  . nitroGLYCERIN (NITROSTAT) 0.4 MG SL tablet Place 1 tablet (0.4 mg total) under the tongue every 5 (five) minutes as needed for chest pain.  25 tablet  3  . pantoprazole (PROTONIX) 40 MG tablet Take 1 tablet (40 mg total) by mouth daily.  90 tablet  3  . rosuvastatin (CRESTOR) 40 MG tablet Take 1 tablet (40 mg total) by mouth daily.  90 tablet  3  . traMADol (ULTRAM)  50 MG tablet Take 50 mg by mouth every 6 (six) hours as needed.        . Tamsulosin HCl (FLOMAX) 0.4 MG CAPS Take 1 capsule (0.4 mg total) by mouth daily after supper.  7 capsule  0    Review of Systems Review of Systems  Cardiovascular: Negative.   Gastrointestinal: Negative.   Genitourinary: Positive for difficulty urinating (mild).    Blood pressure 160/66, pulse 64, temperature 97.4 F (36.3 C), temperature source Temporal, resp. rate 16, height 6\' 1"  (1.854 m), weight 208 lb (94.348 kg).  Physical Exam Physical Exam  Constitutional: He appears well-developed and well-nourished. No distress.  HENT:  Head: Normocephalic and atraumatic.  Cardiovascular: Normal rate, regular rhythm and normal heart sounds.   Abdominal: Soft.       Very small umbilical hernia  Genitourinary:       Reducible moderate-sized left inguinal hernia. No evidence of right inguinal hernia. Testicles without masses.    Data Reviewed Dr. Elvis Coil latest note.  Assessment    Moderate-sized enlarging left inguinal hernia.  Has some mild BPH symptoms.    Plan    Left inguinal  hernia repair with mesh with OnQ pump placement in early to mid December. Perioperative Flomax to try to avoid urinary retention.  I have explained the procedure, risks, and aftercare of inguinal hernia repair.  Risks include but are not limited to bleeding, infection, wound problems, anesthesia, recurrence, bladder or intestine injury, urinary retention, testicular dysfunction, chronic pain, mesh problems.  He seems to understand and agrees to proceed.       Efrata Brunner J 08/24/2012, 10:55 AM

## 2012-10-15 ENCOUNTER — Encounter (HOSPITAL_COMMUNITY): Payer: Self-pay

## 2012-10-16 ENCOUNTER — Encounter (HOSPITAL_COMMUNITY): Payer: Self-pay

## 2012-10-16 ENCOUNTER — Encounter (HOSPITAL_COMMUNITY)
Admission: RE | Admit: 2012-10-16 | Discharge: 2012-10-16 | Disposition: A | Discharge status: Home / Self Care | Payer: Medicare Other | Source: Ambulatory Visit | Attending: General Surgery | Admitting: General Surgery

## 2012-10-16 ENCOUNTER — Encounter (HOSPITAL_COMMUNITY)
Admission: RE | Admit: 2012-10-16 | Discharge: 2012-10-16 | Disposition: A | Discharge status: Home / Self Care | Payer: Medicare Other | Source: Ambulatory Visit | Attending: Anesthesiology | Admitting: Anesthesiology

## 2012-10-16 HISTORY — DX: Essential (primary) hypertension: I10

## 2012-10-16 LAB — COMPREHENSIVE METABOLIC PANEL WITH GFR
ALT: 25 U/L (ref 0–53)
AST: 26 U/L (ref 0–37)
Albumin: 3.8 g/dL (ref 3.5–5.2)
Alkaline Phosphatase: 59 U/L (ref 39–117)
BUN: 11 mg/dL (ref 6–23)
CO2: 31 meq/L (ref 19–32)
Calcium: 9.6 mg/dL (ref 8.4–10.5)
Chloride: 101 meq/L (ref 96–112)
Creatinine, Ser: 0.88 mg/dL (ref 0.50–1.35)
GFR calc Af Amer: >90 mL/min
GFR calc non Af Amer: 86 mL/min — ABNORMAL LOW
Glucose, Bld: 128 mg/dL — ABNORMAL HIGH (ref 70–99)
Potassium: 4.4 meq/L (ref 3.5–5.1)
Sodium: 138 meq/L (ref 135–145)
Total Bilirubin: 0.4 mg/dL (ref 0.3–1.2)
Total Protein: 6.9 g/dL (ref 6.0–8.3)

## 2012-10-16 LAB — SURGICAL PCR SCREEN
MRSA, PCR: NEGATIVE
Staphylococcus aureus: NEGATIVE

## 2012-10-16 LAB — CBC WITH DIFFERENTIAL/PLATELET
Basophils Absolute: 0 10*3/uL (ref 0.0–0.1)
Basophils Relative: 1 % (ref 0–1)
Eosinophils Absolute: 0.1 10*3/uL (ref 0.0–0.7)
Eosinophils Relative: 2 % (ref 0–5)
HCT: 42.7 % (ref 39.0–52.0)
Hemoglobin: 14.3 g/dL (ref 13.0–17.0)
Lymphocytes Relative: 23 % (ref 12–46)
Lymphs Abs: 1.5 10*3/uL (ref 0.7–4.0)
MCH: 31.6 pg (ref 26.0–34.0)
MCHC: 33.5 g/dL (ref 30.0–36.0)
MCV: 94.3 fL (ref 78.0–100.0)
Monocytes Absolute: 0.5 10*3/uL (ref 0.1–1.0)
Monocytes Relative: 8 % (ref 3–12)
Neutro Abs: 4.4 10*3/uL (ref 1.7–7.7)
Neutrophils Relative %: 67 % (ref 43–77)
Platelets: 155 10*3/uL (ref 150–400)
RBC: 4.53 MIL/uL (ref 4.22–5.81)
RDW: 13.2 % (ref 11.5–15.5)
WBC: 6.6 10*3/uL (ref 4.0–10.5)

## 2012-10-16 LAB — PROTIME-INR
INR: 0.93 (ref 0.00–1.49)
Prothrombin Time: 12.4 s (ref 11.6–15.2)

## 2012-10-16 NOTE — Progress Notes (Signed)
Copies of sleep study from 09/23/2011 received from [facility].  Placed in chart.

## 2012-10-16 NOTE — Progress Notes (Signed)
Pt here for preadmission.  Family MD: Marga Melnick; Cardiologist: Peter Swaziland.  Heart Cath in Epic.  Reports that he thought [facility] did Echo at the same time- not seen in Epic.  Reports sleep apnea.  Instructed to bring mask.  Pt does not know settings. Requested copy of sleep study w/in last 42yrs from [facility], [place], [place].

## 2012-10-16 NOTE — Pre-Procedure Instructions (Addendum)
20 Robert [name]  10/16/2012   Your procedure is scheduled on:  Monday, Dec. 9th@11 :00AM.  Report to [facility] at  9:00AM.  Call this number if you have problems the morning of surgery: [phone]   Remember:   Do not eat food or drink fluids:After Midnight    Take these medicines the morning of surgery with A SIP OF WATER: Metoprolol Tartrate( Lopressor), Nitroglycerin-if needed, Pantoprazole( Protonix), and Tramadol(Ultram)-if needed.                **Discontinue aspirin, Coumadin, Plavix, Effient, and herbal medications    Do not wear jewelry, make-up or nail polish.  Do not wear lotions, powders, or perfumes. You may wear deodorant.  Do not shave 48 hours prior to surgery. Men may shave face and neck.  Do not bring valuables to the hospital.  Contacts, dentures or bridgework may not be worn into surgery.  Leave suitcase in the car. After surgery it may be brought to your room.  For patients admitted to the hospital, checkout time is 11:00 AM the day of discharge.   Patients discharged the day of surgery will not be allowed to drive home.  Name and phone number of your driver: Robert [name], wife   Special Instructions: Shower using CHG 2 nights before surgery and the night before surgery.  If you shower the day of surgery use CHG.  Use special wash - you have one bottle of CHG for all showers.  You should use approximately 1/3 of the bottle for each shower.   Please read over the following fact sheets that you were given: Pain Booklet, Coughing and Deep Breathing and Surgical Site Infection Prevention

## 2012-10-22 MED ORDER — VANCOMYCIN HCL IN DEXTROSE 1-5 GM/200ML-% IV SOLN
1000.0000 mg | INTRAVENOUS | Status: AC
  Administered 2012-10-23: 1000 mg via INTRAVENOUS
  Filled 2012-10-22: qty 200

## 2012-10-23 ENCOUNTER — Ambulatory Visit (HOSPITAL_COMMUNITY)
Admission: RE | Admit: 2012-10-23 | Discharge: 2012-10-23 | Disposition: A | Discharge status: Home / Self Care | Payer: Medicare Other | Source: Ambulatory Visit | Attending: General Surgery | Admitting: General Surgery

## 2012-10-23 ENCOUNTER — Encounter (HOSPITAL_COMMUNITY): Payer: Self-pay | Admitting: Certified Registered"

## 2012-10-23 ENCOUNTER — Ambulatory Visit (HOSPITAL_COMMUNITY): Payer: Medicare Other | Admitting: Certified Registered"

## 2012-10-23 ENCOUNTER — Encounter (HOSPITAL_COMMUNITY)
Admission: RE | Disposition: A | Discharge status: Home / Self Care | Payer: Self-pay | Source: Ambulatory Visit | Attending: General Surgery

## 2012-10-23 DIAGNOSIS — Z8249 Family history of ischemic heart disease and other diseases of the circulatory system: Secondary | ICD-10-CM | POA: Insufficient documentation

## 2012-10-23 DIAGNOSIS — I252 Old myocardial infarction: Secondary | ICD-10-CM | POA: Insufficient documentation

## 2012-10-23 DIAGNOSIS — K219 Gastro-esophageal reflux disease without esophagitis: Secondary | ICD-10-CM | POA: Insufficient documentation

## 2012-10-23 DIAGNOSIS — K4090 Unilateral inguinal hernia, without obstruction or gangrene, not specified as recurrent: Secondary | ICD-10-CM

## 2012-10-23 DIAGNOSIS — Z9861 Coronary angioplasty status: Secondary | ICD-10-CM | POA: Insufficient documentation

## 2012-10-23 DIAGNOSIS — I10 Essential (primary) hypertension: Secondary | ICD-10-CM | POA: Insufficient documentation

## 2012-10-23 DIAGNOSIS — Z801 Family history of malignant neoplasm of trachea, bronchus and lung: Secondary | ICD-10-CM | POA: Insufficient documentation

## 2012-10-23 DIAGNOSIS — E785 Hyperlipidemia, unspecified: Secondary | ICD-10-CM | POA: Insufficient documentation

## 2012-10-23 DIAGNOSIS — Z7982 Long term (current) use of aspirin: Secondary | ICD-10-CM | POA: Insufficient documentation

## 2012-10-23 DIAGNOSIS — Z880 Allergy status to penicillin: Secondary | ICD-10-CM | POA: Insufficient documentation

## 2012-10-23 DIAGNOSIS — I2510 Atherosclerotic heart disease of native coronary artery without angina pectoris: Secondary | ICD-10-CM | POA: Insufficient documentation

## 2012-10-23 DIAGNOSIS — Z800 Family history of malignant neoplasm of digestive organs: Secondary | ICD-10-CM | POA: Insufficient documentation

## 2012-10-23 DIAGNOSIS — G4730 Sleep apnea, unspecified: Secondary | ICD-10-CM | POA: Insufficient documentation

## 2012-10-23 HISTORY — PX: INSERTION OF MESH: SHX5868

## 2012-10-23 HISTORY — PX: INGUINAL HERNIA REPAIR: SHX194

## 2012-10-23 SURGERY — REPAIR, HERNIA, INGUINAL, ADULT
Anesthesia: General | Site: Groin | Laterality: Left | Wound class: Clean

## 2012-10-23 MED ORDER — BUPIVACAINE-EPINEPHRINE 0.5% -1:200000 IJ SOLN
INTRAMUSCULAR | Status: DC | PRN
  Administered 2012-10-23: 30 mL

## 2012-10-23 MED ORDER — FENTANYL CITRATE 0.05 MG/ML IJ SOLN
INTRAMUSCULAR | Status: DC | PRN
  Administered 2012-10-23 (×2): 25 ug via INTRAVENOUS
  Administered 2012-10-23 (×2): 50 ug via INTRAVENOUS

## 2012-10-23 MED ORDER — LACTATED RINGERS IV SOLN
INTRAVENOUS | Status: DC | PRN
  Administered 2012-10-23: 12:00:00 via INTRAVENOUS

## 2012-10-23 MED ORDER — BUPIVACAINE 0.25 % ON-Q PUMP SINGLE CATH 300ML
300.0000 mL | INJECTION | Status: AC
  Administered 2012-10-23: 300 mL
  Filled 2012-10-23: qty 300

## 2012-10-23 MED ORDER — ACETAMINOPHEN 325 MG PO TABS: 650.0000 mg | ORAL_TABLET | ORAL | Status: DC | PRN

## 2012-10-23 MED ORDER — ONDANSETRON HCL 4 MG/2ML IJ SOLN: 4.0000 mg | Freq: Four times a day (QID) | INTRAMUSCULAR | Status: DC | PRN

## 2012-10-23 MED ORDER — HYDROMORPHONE HCL PF 1 MG/ML IJ SOLN: 0.2500 mg | INTRAMUSCULAR | Status: DC | PRN

## 2012-10-23 MED ORDER — LIDOCAINE HCL (CARDIAC) 20 MG/ML IV SOLN
INTRAVENOUS | Status: DC | PRN
  Administered 2012-10-23: 40 mg via INTRAVENOUS

## 2012-10-23 MED ORDER — 0.9 % SODIUM CHLORIDE (POUR BTL) OPTIME
TOPICAL | Status: DC | PRN
  Administered 2012-10-23: 1000 mL

## 2012-10-23 MED ORDER — OXYCODONE-ACETAMINOPHEN 5-325 MG PO TABS: 1.0000 | ORAL_TABLET | ORAL | Status: DC | PRN

## 2012-10-23 MED ORDER — ACETAMINOPHEN 650 MG RE SUPP: 650.0000 mg | RECTAL | Status: DC | PRN

## 2012-10-23 MED ORDER — SODIUM CHLORIDE 0.9 % IJ SOLN: 3.0000 mL | INTRAMUSCULAR | Status: DC | PRN

## 2012-10-23 MED ORDER — NEOSTIGMINE METHYLSULFATE 1 MG/ML IJ SOLN
INTRAMUSCULAR | Status: DC | PRN
  Administered 2012-10-23: 5 mg via INTRAVENOUS

## 2012-10-23 MED ORDER — MORPHINE SULFATE 2 MG/ML IJ SOLN: 2.0000 mg | INTRAMUSCULAR | Status: DC | PRN

## 2012-10-23 MED ORDER — GLYCOPYRROLATE 0.2 MG/ML IJ SOLN
INTRAMUSCULAR | Status: DC | PRN
  Administered 2012-10-23: .7 mg via INTRAVENOUS

## 2012-10-23 MED ORDER — BUPIVACAINE-EPINEPHRINE (PF) 0.5% -1:200000 IJ SOLN
INTRAMUSCULAR | Status: AC
  Filled 2012-10-23: qty 10

## 2012-10-23 MED ORDER — OXYCODONE HCL 5 MG PO TABS
5.0000 mg | ORAL_TABLET | ORAL | Status: DC | PRN
  Administered 2012-10-23: 5 mg via ORAL

## 2012-10-23 MED ORDER — ONDANSETRON HCL 4 MG/2ML IJ SOLN
INTRAMUSCULAR | Status: DC | PRN
  Administered 2012-10-23: 4 mg via INTRAVENOUS

## 2012-10-23 MED ORDER — LACTATED RINGERS IV SOLN
INTRAVENOUS | Status: DC
  Administered 2012-10-23: 11:00:00 via INTRAVENOUS

## 2012-10-23 MED ORDER — OXYCODONE HCL 5 MG PO TABS
ORAL_TABLET | ORAL | Status: AC
Start: 2012-10-23 — End: 2012-10-23
  Administered 2012-10-23: 5 mg via ORAL
  Filled 2012-10-23: qty 1

## 2012-10-23 MED ORDER — PROPOFOL 10 MG/ML IV BOLUS
INTRAVENOUS | Status: DC | PRN
  Administered 2012-10-23: 200 mg via INTRAVENOUS

## 2012-10-23 MED ORDER — MIDAZOLAM HCL 5 MG/5ML IJ SOLN
INTRAMUSCULAR | Status: DC | PRN
  Administered 2012-10-23: 2 mg via INTRAVENOUS

## 2012-10-23 MED ORDER — ROCURONIUM BROMIDE 100 MG/10ML IV SOLN
INTRAVENOUS | Status: DC | PRN
  Administered 2012-10-23: 50 mg via INTRAVENOUS

## 2012-10-23 SURGICAL SUPPLY — 51 items
BENZOIN TINCTURE PRP APPL 2/3 (GAUZE/BANDAGES/DRESSINGS) ×2 IMPLANT
BLADE SURG 10 STRL SS (BLADE) ×2 IMPLANT
BLADE SURG 15 STRL LF DISP TIS (BLADE) ×1 IMPLANT
BLADE SURG 15 STRL SS (BLADE) ×1
BLADE SURG ROTATE 9660 (MISCELLANEOUS) IMPLANT
CATH KIT ON Q 2.5IN SLV (PAIN MANAGEMENT) ×2 IMPLANT
CHLORAPREP W/TINT 26ML (MISCELLANEOUS) ×2 IMPLANT
CLOTH BEACON ORANGE TIMEOUT ST (SAFETY) ×2 IMPLANT
CLSR STERI-STRIP ANTIMIC 1/2X4 (GAUZE/BANDAGES/DRESSINGS) ×2 IMPLANT
COVER SURGICAL LIGHT HANDLE (MISCELLANEOUS) ×2 IMPLANT
DRAIN PENROSE 1/2X12 LTX STRL (WOUND CARE) ×2 IMPLANT
DRAPE INCISE IOBAN 66X45 STRL (DRAPES) ×2 IMPLANT
DRAPE LAPAROTOMY TRNSV 102X78 (DRAPE) ×2 IMPLANT
DRAPE UTILITY 15X26 W/TAPE STR (DRAPE) ×4 IMPLANT
DRESSING TELFA 8X3 (GAUZE/BANDAGES/DRESSINGS) ×2 IMPLANT
DRSG OPSITE 6X11 MED (GAUZE/BANDAGES/DRESSINGS) ×2 IMPLANT
DRSG TEGADERM 4X4.75 (GAUZE/BANDAGES/DRESSINGS) ×2 IMPLANT
ELECT CAUTERY BLADE 6.4 (BLADE) ×2 IMPLANT
ELECT REM PT RETURN 9FT ADLT (ELECTROSURGICAL) ×2
ELECTRODE REM PT RTRN 9FT ADLT (ELECTROSURGICAL) ×1 IMPLANT
GLOVE BIOGEL PI IND STRL 6.5 (GLOVE) ×3 IMPLANT
GLOVE BIOGEL PI IND STRL 8 (GLOVE) ×1 IMPLANT
GLOVE BIOGEL PI INDICATOR 6.5 (GLOVE) ×3
GLOVE BIOGEL PI INDICATOR 8 (GLOVE) ×1
GLOVE ECLIPSE 6.5 STRL STRAW (GLOVE) ×4 IMPLANT
GLOVE ECLIPSE 8.0 STRL XLNG CF (GLOVE) ×2 IMPLANT
GOWN STRL NON-REIN LRG LVL3 (GOWN DISPOSABLE) ×4 IMPLANT
KIT BASIN OR (CUSTOM PROCEDURE TRAY) ×2 IMPLANT
KIT ROOM TURNOVER OR (KITS) ×2 IMPLANT
MESH BARD SOFT 3X6IN (Mesh General) ×2 IMPLANT
NEEDLE HYPO 25GX1X1/2 BEV (NEEDLE) ×2 IMPLANT
NS IRRIG 1000ML POUR BTL (IV SOLUTION) ×2 IMPLANT
PACK SURGICAL SETUP 50X90 (CUSTOM PROCEDURE TRAY) ×2 IMPLANT
PAD ARMBOARD 7.5X6 YLW CONV (MISCELLANEOUS) ×2 IMPLANT
PENCIL BUTTON HOLSTER BLD 10FT (ELECTRODE) ×2 IMPLANT
SPECIMEN JAR SMALL (MISCELLANEOUS) IMPLANT
SPONGE LAP 18X18 X RAY DECT (DISPOSABLE) ×2 IMPLANT
STRIP CLOSURE SKIN 1/2X4 (GAUZE/BANDAGES/DRESSINGS) ×2 IMPLANT
SUT MON AB 4-0 PC3 18 (SUTURE) ×2 IMPLANT
SUT PROLENE 2 0 CT2 30 (SUTURE) ×4 IMPLANT
SUT SILK 2 0 SH (SUTURE) ×2 IMPLANT
SUT VIC AB 2-0 CT1 18 (SUTURE) ×2 IMPLANT
SUT VIC AB 2-0 SH 18 (SUTURE) ×2 IMPLANT
SUT VIC AB 3-0 SH 27 (SUTURE) ×1
SUT VIC AB 3-0 SH 27XBRD (SUTURE) ×1 IMPLANT
SUT VICRYL AB 3 0 TIES (SUTURE) ×2 IMPLANT
SYR CONTROL 10ML LL (SYRINGE) ×2 IMPLANT
TOWEL OR 17X24 6PK STRL BLUE (TOWEL DISPOSABLE) ×2 IMPLANT
TOWEL OR 17X26 10 PK STRL BLUE (TOWEL DISPOSABLE) ×2 IMPLANT
TUBE CONNECTING 12X1/4 (SUCTIONS) ×2 IMPLANT
YANKAUER SUCT BULB TIP NO VENT (SUCTIONS) ×2 IMPLANT

## 2012-10-23 NOTE — Transfer of Care (Signed)
Immediate Anesthesia Transfer of Care Note  Patient: Robert [name]  Procedure(s) Performed: Procedure(s) (LRB) with comments: HERNIA REPAIR INGUINAL ADULT (Left) - Left lower quadrant; left inguinal hernia repair with mesh INSERTION OF MESH (Left) - Left lower quadrant  Patient Location: PACU  Anesthesia Type:General  Level of Consciousness: awake, alert  and oriented  Airway & Oxygen Therapy: Patient Spontanous Breathing  Post-op Assessment: Report given to PACU RN  Post vital signs: stable  Complications: No apparent anesthesia complications

## 2012-10-23 NOTE — Anesthesia Postprocedure Evaluation (Signed)
Anesthesia Post Note  Patient: Robert [name]  Procedure(s) Performed: Procedure(s) (LRB): HERNIA REPAIR INGUINAL ADULT (Left) INSERTION OF MESH (Left)  Anesthesia type: general  Patient location: PACU  Post pain: Pain level controlled  Post assessment: Patient's Cardiovascular Status Stable  Last Vitals:  Filed Vitals:   10/23/12 1445  BP: 132/55  Pulse: 53  Temp:   Resp: 13    Post vital signs: Reviewed and stable  Level of consciousness: sedated  Complications: No apparent anesthesia complications

## 2012-10-23 NOTE — Preoperative (Signed)
Beta Blockers   Reason not to administer Beta Blockers:Not Applicable 

## 2012-10-23 NOTE — Op Note (Signed)
Preoperative diagnosis:  Left inguinal hernia.  Postoperative diagnosis:  Same (direct)  Procedure:  Left inguinal hernia repair with mesh and placement of On-Q pump  Surgeon:  Avel Peace, M.D.  Anesthesia:  General/LMA with local (Marcaine).  Indication:  This is a 66 y.o male with an enlarging, symptomatic left inguinal hernia who presents for repair.  Technique:  He was seen in the holding room and the left groin was marked with my initials. The patient was brought to the operating, placed supine on the operating table, and the anesthetic was administered by the anesthesiologist. The hair in the groin area was clipped as was felt to be necessary. This area was then sterilely prepped and draped.  Local anesthetic was infiltrated in the superficial and deep tissues in the left groin.  An incision was made through the skin and subcutaneous tissue until the external oblique aponeurosis was identified.  Local anesthetic was infiltrated deep to the external oblique aponeurosis. The external oblique aponeurosis was divided through the external ring medially and back toward the anterior superior iliac spine laterally. Using blunt dissection, the shelving edge of the inguinal ligament was identified inferiorly and the internal oblique aponeurosis and muscle were identified superiorly. The ilioinguinal nerve was identified and preserved.  A large direct hernia was noted with the sac adherent to the spermatic cord.  The spermatic cord was isolated and a posterior window was made around it. The direct hernia sac was separated from the spermatic cord using blunt dissection. The hernia sac and its contents were reduced through the direct hernia defect.  The attenuated transversalis fascia was re-approximated with a running 2-0 Vicryl suture.   A piece of 3" x 6" polypropylene mesh was brought into the field and anchored 1-2 cm medial to the pubic tubercle with 2-0 Prolene suture. The inferior aspect of  the mesh was anchored to the shelving edge of the inguinal ligament with running 2-0 Prolene suture to a level 1-2 cm lateral to the internal ring. A slit was cut in the mesh creating 2 tails. These were wrapped around the spermatic cord. The superior aspect of the mesh was anchored to the internal oblique aponeurosis and muscle with interrupted 2-0 Vicryl sutures. The 2 tails of the mesh were then crossed creating a new internal ring and were anchored to the shelving edge of the inguinal ligament with 2-0 Prolene suture. The tip of a hemostat could be placed through the new aperture. The lateral aspect of the mesh was then tucked deep to the external oblique aponeurosis.  The On-Q catheter was introduced into the wound via a lateral approach and place anterior to the mesh.  The wound was inspected and hemostasis was adequate. The external oblique aponeurosis was then closed over the mesh and cord with running 3-0 Vicryl suture. The subcutaneous tissue was closed with running 3-0 Vicryl suture. The skin closed with a running 4-0 Monocryl subcuticular stitch.  Steri-Strips and a sterile dressing were applied.  The On-Q pump reservoir was hooked up to the catheter.  The procedure was well-tolerated without any apparent complications and the patient was taken to the recovery room in satisfactory condition.

## 2012-10-23 NOTE — H&P (Signed)
Robert [name] is an 66 y.o. male.   Chief Complaint:   Here for elective Left inguinal hernia repair HPI:  He is well known to me. I saw him over a year ago because of a left inguinal hernia. A few months after I saw him, he suffered a myocardial infarction and had a stent placed. It is a drug-eluting stent and he'll need to be on his Plavix and aspirin until November 26. At that time, he would be able to be off of it for the surgery. He's been seen by his cardiologist Dr. Swaziland who feels he would be okay to proceed with the surgery after that time. Robert [name] feels the hernia is slightly larger.   Past Medical History  Diagnosis Date  . GERD (gastroesophageal reflux disease)   . 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 Migraines   . CAD (coronary artery disease) 10/10/2011    NSTEMI with DES to the proximal RCA following flow wire evaluation. EF is normal.   . Hyperlipidemia   . Hypertension     Past Surgical History  Procedure Date  . Back surgery   . Shoulder surgery   . Hand surgery   . Coronary stent placement 10/13/2011    DES to the proximal RCA. Dr Swaziland    Family History  Problem Relation Age of Onset  . Cancer Mother     lung  . Heart disease Father     heart attack   . Cancer Father     colon   Social History:  reports that he quit smoking about 29 years ago. His smoking use included Cigarettes. He has never used smokeless tobacco. He reports that he drinks alcohol. He reports that he does not use illicit drugs.  Allergies:  Allergies  Allergen Reactions  . Penicillins     itching  . Sumatriptan     REACTION: sob, headache scalp sensitivity    Medications Prior to Admission  Medication Sig Dispense Refill  . aspirin 81 MG tablet Take 81 mg by mouth daily.        . Cholecalciferol (VITAMIN D) 2000 UNITS CAPS Take 1 capsule by mouth daily.      Marland Kitchen ezetimibe (ZETIA) 10 MG tablet Take 5 mg by mouth daily.      . fish oil-omega-3 fatty acids 1000 MG capsule Take 1  g by mouth daily.      Marland Kitchen levocetirizine (XYZAL) 5 MG tablet Take 5 mg by mouth every evening.       . metoprolol tartrate (LOPRESSOR) 25 MG tablet Take 0.5 tablets (12.5 mg total) by mouth 2 (two) times daily.  90 tablet  3  . Multiple Vitamins-Minerals (MULTIVITAMINS THER. W/MINERALS) TABS Take 1 tablet by mouth daily.      . pantoprazole (PROTONIX) 40 MG tablet Take 1 tablet (40 mg total) by mouth daily.  90 tablet  3  . rosuvastatin (CRESTOR) 40 MG tablet Take 1 tablet (40 mg total) by mouth daily.  90 tablet  3  . Tamsulosin HCl (FLOMAX) 0.4 MG CAPS Take 0.4 mg by mouth daily as needed. For prostate      . traMADol (ULTRAM) 50 MG tablet Take 50 mg by mouth every 6 (six) hours as needed. For pain      . vitamin C (ASCORBIC ACID) 500 MG tablet Take 500 mg by mouth daily.      . mometasone (NASONEX) 50 MCG/ACT nasal spray Place 2 sprays  into the nose daily.  17 g  3  . nitroGLYCERIN (NITROSTAT) 0.4 MG SL tablet Place 1 tablet (0.4 mg total) under the tongue every 5 (five) minutes as needed for chest pain.  25 tablet  3    No results found for this or any previous visit (from the past 48 hour(s)). No results found.  Review of Systems  Constitutional: Negative for fever and chills.  Gastrointestinal: Negative for nausea, vomiting and diarrhea.    Blood pressure 134/72, pulse 61, temperature 98 F (36.7 C), temperature source Oral, resp. rate 18, SpO2 99.00%. Physical Exam  Constitutional: No distress.       Overweight male.  Neck: Neck supple.  Cardiovascular: Normal rate and regular rhythm.   Respiratory: Effort normal and breath sounds normal.  GI: Soft. There is no tenderness.  Genitourinary:       Left inguinal bulge of moderate size.  Musculoskeletal: He exhibits no edema.  Lymphadenopathy:    He has no cervical adenopathy.     Assessment/Plan Left Inguinal Hernia (LIH)  Plan:  [facility] repair with mesh and On-Q pump placement.  Procedure, risks, and aftercare discussed with  him preoperatively.  Charlette Hennings J 10/23/2012, 11:40 AM

## 2012-10-23 NOTE — Anesthesia Procedure Notes (Signed)
Procedure Name: Intubation Date/Time: 10/23/2012 11:52 AM Performed by: Ellin Goodie Pre-anesthesia Checklist: Patient identified, Emergency Drugs available, Suction available, Patient being monitored and Timeout performed Patient Re-evaluated:Patient Re-evaluated prior to inductionOxygen Delivery Method: Circle system utilized Preoxygenation: Pre-oxygenation with 100% oxygen Intubation Type: IV induction and Cricoid Pressure applied Ventilation: Mask ventilation without difficulty Laryngoscope Size: Mac and 4 Grade View: Grade II Tube type: Oral Tube size: 7.5 mm Number of attempts: 1 Airway Equipment and Method: Stylet Placement Confirmation: ETT inserted through vocal cords under direct vision,  positive ETCO2 and breath sounds checked- equal and bilateral Secured at: 23 cm Tube secured with: Tape Dental Injury: Teeth and Oropharynx as per pre-operative assessment

## 2012-10-23 NOTE — Anesthesia Preprocedure Evaluation (Addendum)
Anesthesia Evaluation  Patient identified by MRN, date of birth, ID band Patient awake    Reviewed: Allergy & Precautions, H&P , NPO status , Patient's Chart, lab work & pertinent test results, reviewed documented beta blocker date and time   Airway Mallampati: II      Dental  (+) Teeth Intact and Dental Advisory Given   Pulmonary sleep apnea and Continuous Positive Airway Pressure Ventilation ,  breath sounds clear to auscultation        Cardiovascular hypertension, Pt. on medications and Pt. on home beta blockers + CAD and + Past MI Rhythm:Regular Rate:Normal     Neuro/Psych  Headaches,    GI/Hepatic Neg liver ROS, GERD-  Controlled,  Endo/Other  negative endocrine ROS  Renal/GU negative Renal ROS     Musculoskeletal   Abdominal (+)  Abdomen: soft. Bowel sounds: normal.  Peds  Hematology negative hematology ROS (+)   Anesthesia Other Findings   Reproductive/Obstetrics                         Anesthesia Physical Anesthesia Plan  ASA: III  Anesthesia Plan: General   Post-op Pain Management:    Induction: Intravenous  Airway Management Planned: Oral ETT  Additional Equipment:   Intra-op Plan:   Post-operative Plan: Possible Post-op intubation/ventilation  Informed Consent: I have reviewed the patients History and Physical, chart, labs and discussed the procedure including the risks, benefits and alternatives for the proposed anesthesia with the patient or authorized representative who has indicated his/her understanding and acceptance.   Dental advisory given  Plan Discussed with:   Anesthesia Plan Comments:         Anesthesia Quick Evaluation

## 2012-10-24 ENCOUNTER — Encounter (HOSPITAL_COMMUNITY): Payer: Self-pay | Admitting: General Surgery

## 2012-10-25 ENCOUNTER — Telehealth (INDEPENDENT_AMBULATORY_CARE_PROVIDER_SITE_OTHER): Payer: Self-pay | Admitting: General Surgery

## 2012-10-25 NOTE — Telephone Encounter (Signed)
Wife called to request different pain med for husband; he is very itchy with the oxycodone.  Called Hydrocodone 5/325 mg, # 30 , 1-2 po Q4-6H prn pain, no refill to [facility]:  [phone]

## 2012-10-31 ENCOUNTER — Telehealth (INDEPENDENT_AMBULATORY_CARE_PROVIDER_SITE_OTHER): Payer: Self-pay

## 2012-10-31 NOTE — Telephone Encounter (Signed)
Patient calling into office today to report a sore lump in his testicle.  Patient 1 week out from Inguinal Hernia Repair.  Patient denies any fever, nausea/vomiting or drainage from incision.  Patient advised to apply heat and alternate with ice, take ibuprofen, elevate feet when at rest.  Patient advised to contact office if no improvement by Friday (11/02/12)

## 2012-11-01 ENCOUNTER — Telehealth (INDEPENDENT_AMBULATORY_CARE_PROVIDER_SITE_OTHER): Payer: Self-pay | Admitting: General Surgery

## 2012-11-01 ENCOUNTER — Telehealth (INDEPENDENT_AMBULATORY_CARE_PROVIDER_SITE_OTHER): Payer: Self-pay

## 2012-11-01 NOTE — Telephone Encounter (Signed)
Pt called stating he has firmness and swelling of testicle on hernia repair side. Pt requests to be seen or speak with MD. Info given to Dr Abbey Chatters. He states he will call pt to discuss symptoms.

## 2012-11-01 NOTE — Telephone Encounter (Signed)
He underwent a left inguinal hernia repair with mesh 9 days ago. He calls reporting some swelling in the testicle and also the spermatic cord area that was quite uncomfortable. I told him that given the fact that his hernia was stuck to his spermatic cord and required a lot of manipulation to free it up and reduce it that this is not unusual in this case. I told him he could take weeks to get better. I advised him to try some moist heat and anti-inflammatory agents as well as an athletic supporter.

## 2012-11-21 ENCOUNTER — Ambulatory Visit (INDEPENDENT_AMBULATORY_CARE_PROVIDER_SITE_OTHER): Payer: Medicare Other | Admitting: General Surgery

## 2012-11-21 ENCOUNTER — Encounter (INDEPENDENT_AMBULATORY_CARE_PROVIDER_SITE_OTHER): Payer: Self-pay | Admitting: General Surgery

## 2012-11-21 VITALS — BP 130/78 | HR 74 | Temp 97.9°F | Resp 18 | Ht 73.0 in | Wt 205.4 lb

## 2012-11-21 DIAGNOSIS — Z9889 Other specified postprocedural states: Secondary | ICD-10-CM

## 2012-11-21 NOTE — Progress Notes (Signed)
Procedure:  Left inguinal hernia repair with mesh  Date:  10/23/2012  Pathology: na  History:  He is here for his first postoperative visit. He had an allergic reaction oxycodone in that he had diffuse itching.  He had significant swelling at first but it is much better now. He has no pain.  Exam: General- Is in NAD.  Left groin-Incision is clean and intact. There is a moderate amount of swelling present. Testicle appears normal.  Assessment:  Progressing well postoperatively. He did have an allergic reaction to oxycodone. This has been discussed with him.  Plan:  Continue light activities for 2 more weeks then resume normal activities as tolerated. Return visit as needed.

## 2012-11-21 NOTE — Patient Instructions (Addendum)
Continue light activities for 2 more weeks. Then, resume normal activities as tolerated as we discussed.  You are allergic to oxycodone.

## 2013-07-02 IMAGING — CT CT HEAD W/O CM
1 series · 16 of 30 positions shown, 20 images · non-contrast
Comparison: 08/22/2002 by report only

CLINICAL DATA: Headache, nausea, vertigo

CT HEAD WITHOUT CONTRAST
TECHNIQUE: Contiguous axial images were obtained from the base of
the skull through the vertex without contrast.

[Series 2: head_seq -c 4.5 h37s st · axial · 0.46mm/px · z∈[-98,+50]mm · 16 of 36 slices shown, 20 images]
[im 2/36  brain]
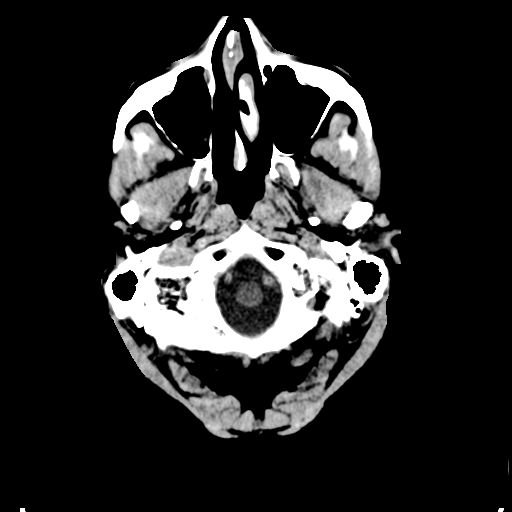
[im 2/36  bone]
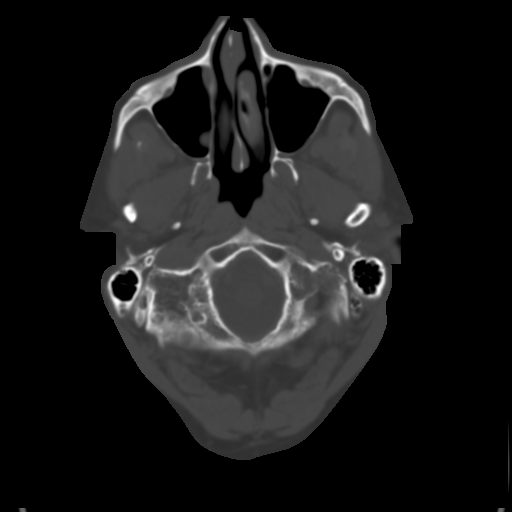
[im 4/36  brain]
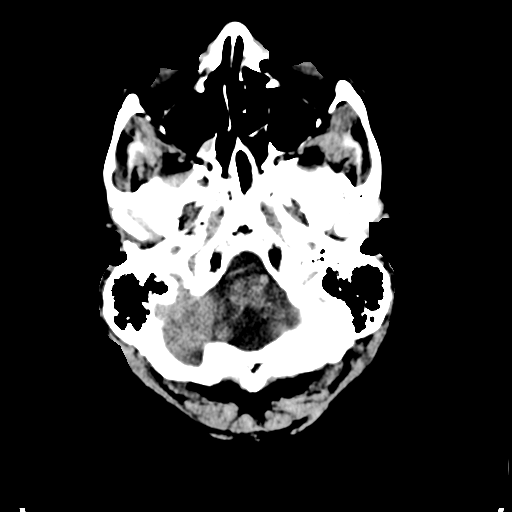
[im 7/36  brain]
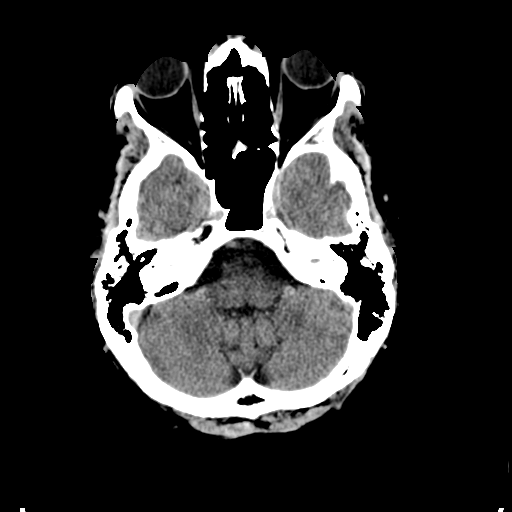
[im 9/36  brain]
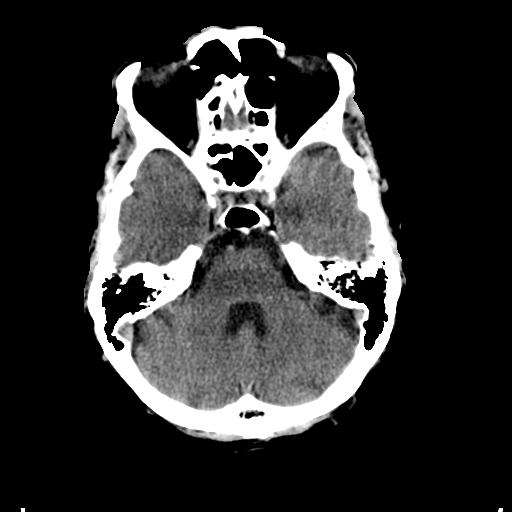
[im 10/36  brain]
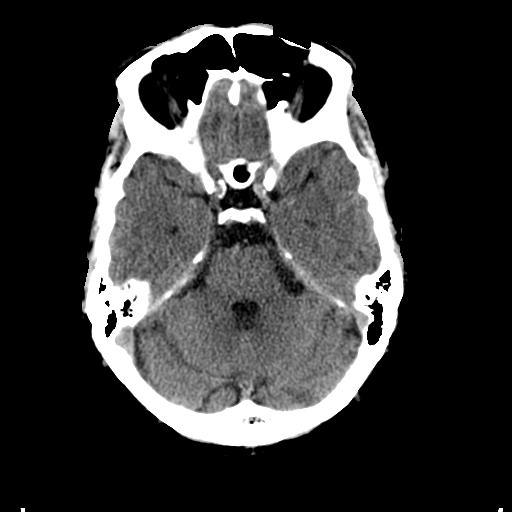
[im 10/36  bone]
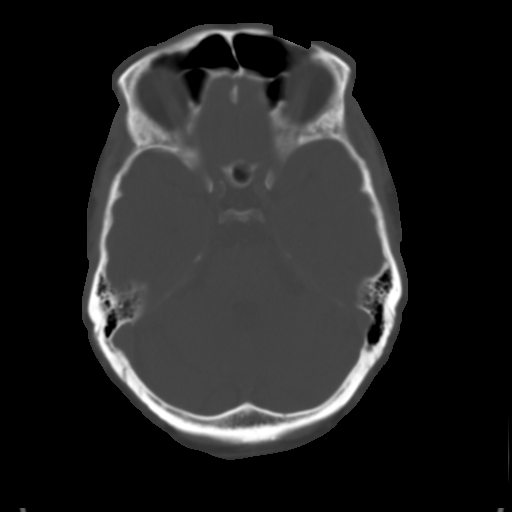
[im 13/36  brain]
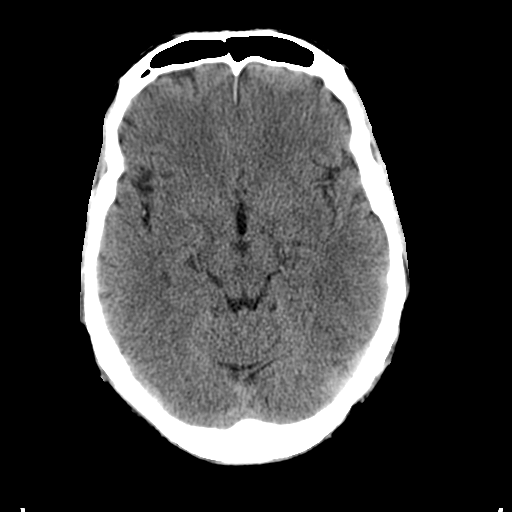
[im 15/36  brain]
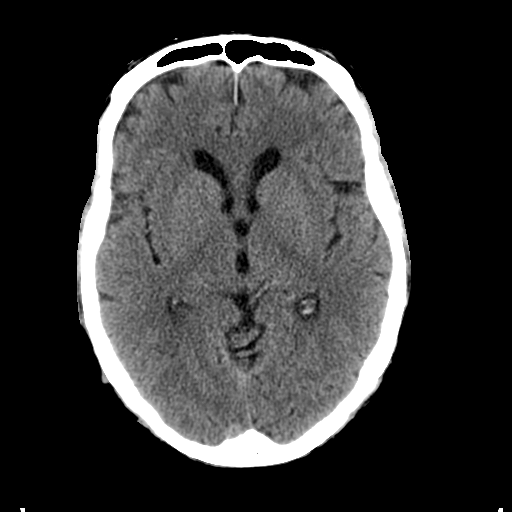
[im 17/36  brain]
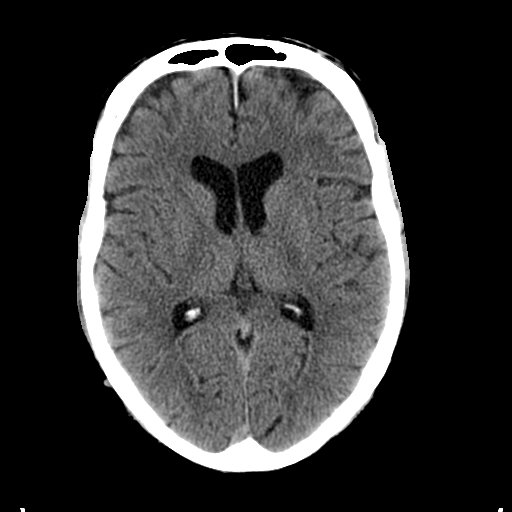
[im 19/36  brain]
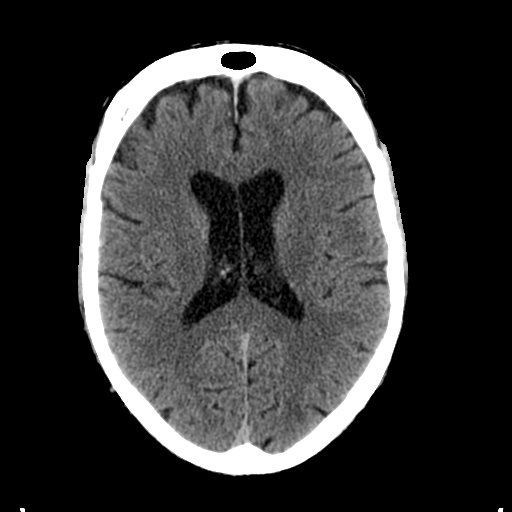
[im 19/36  bone]
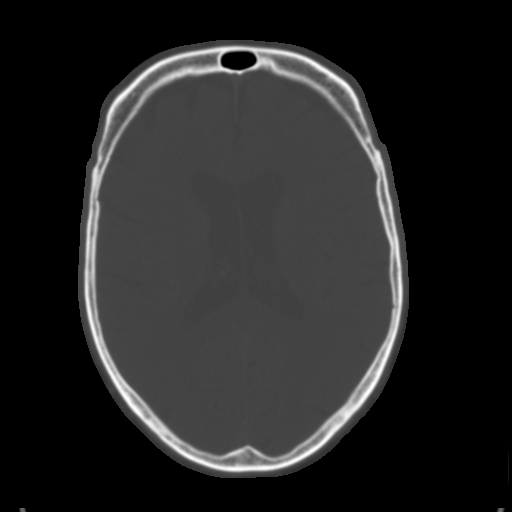
[im 21/36  brain]
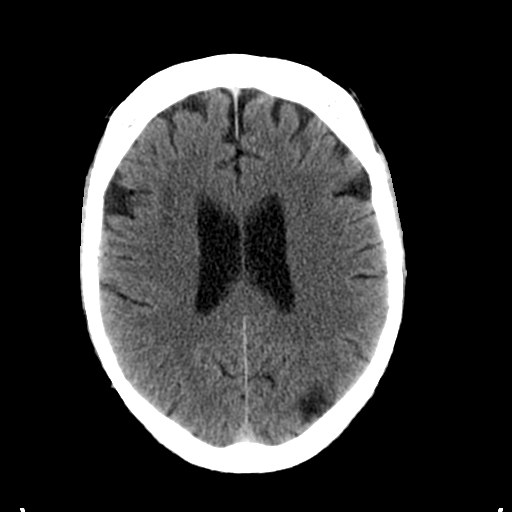
[im 23/36  brain]
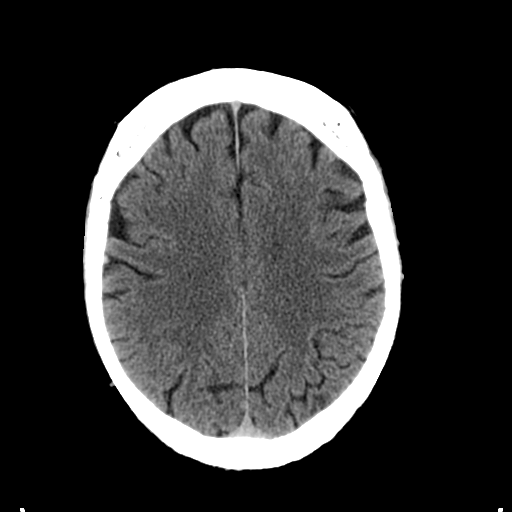
[im 26/36  brain]
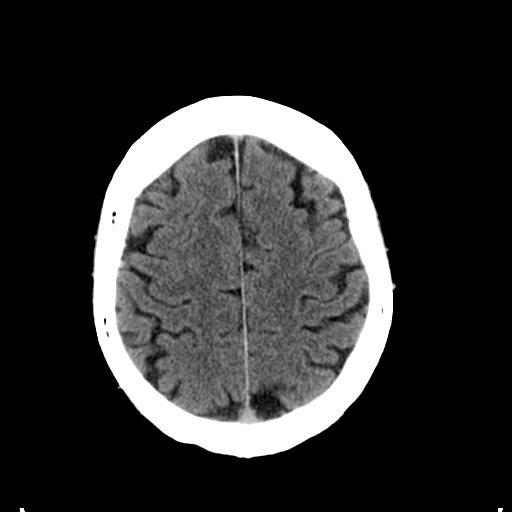
[im 27/36  brain]
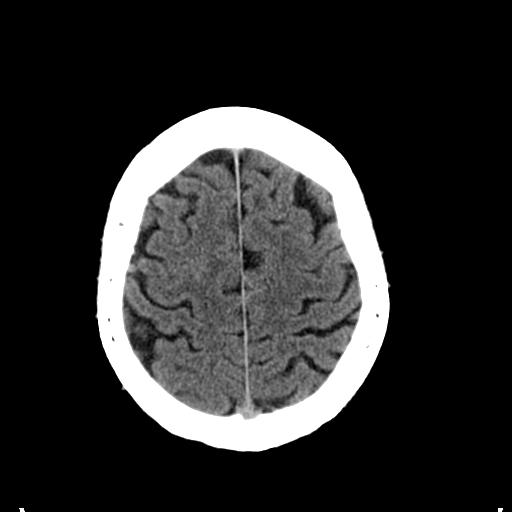
[im 27/36  bone]
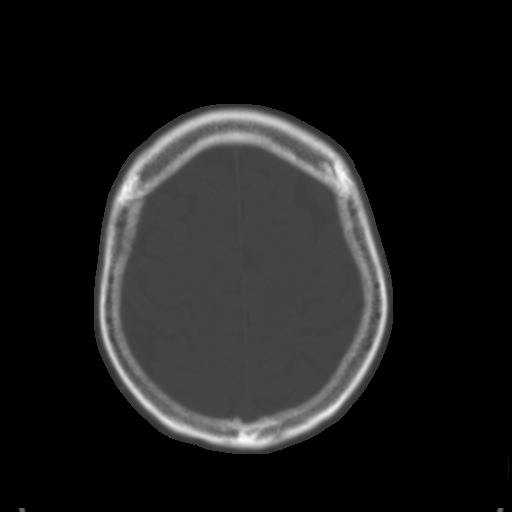
[im 29/36  brain]
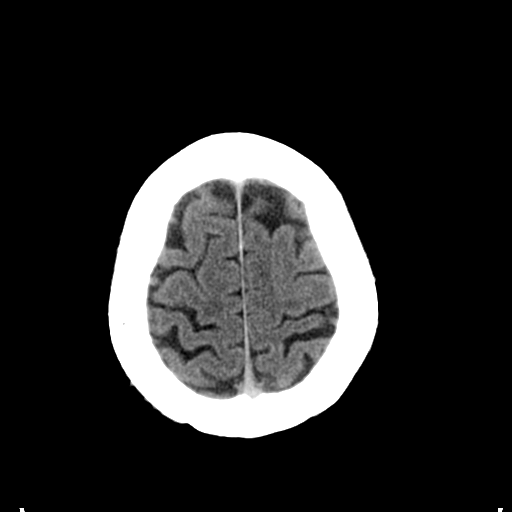
[im 32/36  brain]
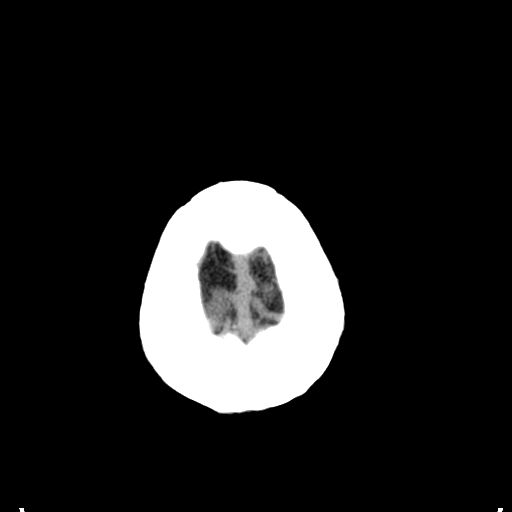
[im 34/36  brain]
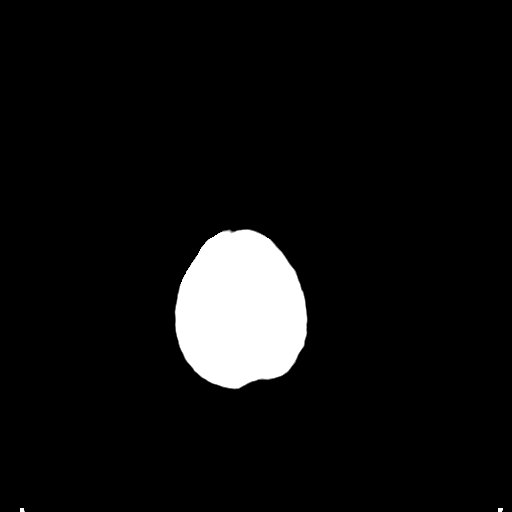

[16 of 30 positions shown; findings below may reference images not displayed]

FINDINGS: Mild diffuse atrophy. Atherosclerotic and physiologic
intracranial calcifications.  Focal parenchymal loss in the left
parieto-occipital region suggesting remote infarct.There is no
evidence of acute intracranial hemorrhage, brain edema, mass
lesion, acute infarction,   mass effect, or midline shift. Acute
infarct may be inapparent on noncontrast CT.  No other intra-axial
abnormalities are seen, and the ventricles and sulci are within
normal limits in size and symmetry.   No abnormal extra-axial fluid
collections or masses are identified.  No significant calvarial
abnormality.
IMPRESSION: 1. Negative for bleed or other acute intracranial process.
2.  Probable old left parieto-occipital infarct.

## 2013-12-25 ENCOUNTER — Ambulatory Visit (INDEPENDENT_AMBULATORY_CARE_PROVIDER_SITE_OTHER): Payer: Medicare HMO | Admitting: Cardiology

## 2013-12-25 ENCOUNTER — Encounter: Payer: Self-pay | Admitting: Cardiology

## 2013-12-25 VITALS — BP 136/54 | HR 55 | Wt 208.0 lb

## 2013-12-25 DIAGNOSIS — I10 Essential (primary) hypertension: Secondary | ICD-10-CM

## 2013-12-25 DIAGNOSIS — E782 Mixed hyperlipidemia: Secondary | ICD-10-CM

## 2013-12-25 DIAGNOSIS — I2510 Atherosclerotic heart disease of native coronary artery without angina pectoris: Secondary | ICD-10-CM

## 2013-12-25 NOTE — Patient Instructions (Signed)
Continue your current therapy  Increase your aerobic activity  I will see you in one year.  Get [place] a copy of your lab work from the [place]

## 2013-12-25 NOTE — Progress Notes (Signed)
Robert [name] Date of Birth: 1946-05-16 Medical Record #[id number]  History of Present Illness: [name] is seen today for a followup visit.  He has had a  NSTEMI with DES to the proximal RCA in November 2012. EF was normal. He has done very well this past year without recurrent chest pain. No palpitations. Admits he is not exercising much this winter. He had successful inguinal hernia repair without complications. He has his lab work done at the [place].  Current Outpatient Prescriptions on File Prior to Visit  Medication Sig Dispense Refill  . aspirin 81 MG tablet Take 81 mg by mouth daily.        . Cholecalciferol (VITAMIN D) 2000 UNITS CAPS Take 1 capsule by mouth daily.      Marland Kitchen ezetimibe (ZETIA) 10 MG tablet Take 5 mg by mouth daily.      . fish oil-omega-3 fatty acids 1000 MG capsule Take 1 g by mouth daily.      Marland Kitchen levocetirizine (XYZAL) 5 MG tablet Take 5 mg by mouth every evening.       . metoprolol tartrate (LOPRESSOR) 25 MG tablet Take 0.5 tablets (12.5 mg total) by mouth 2 (two) times daily.  90 tablet  3  . mometasone (NASONEX) 50 MCG/ACT nasal spray Place 2 sprays into the nose daily.  17 g  3  . Multiple Vitamins-Minerals (MULTIVITAMINS THER. W/MINERALS) TABS Take 1 tablet by mouth daily.      . nitroGLYCERIN (NITROSTAT) 0.4 MG SL tablet Place 1 tablet (0.4 mg total) under the tongue every 5 (five) minutes as needed for chest pain.  25 tablet  3  . pantoprazole (PROTONIX) 40 MG tablet Take 1 tablet (40 mg total) by mouth daily.  90 tablet  3  . Tamsulosin HCl (FLOMAX) 0.4 MG CAPS Take 0.4 mg by mouth daily as needed. For prostate      . traMADol (ULTRAM) 50 MG tablet Take 50 mg by mouth every 6 (six) hours as needed. For pain      . vitamin C (ASCORBIC ACID) 500 MG tablet Take 500 mg by mouth daily.       No current facility-administered medications on file prior to visit.    Allergies  Allergen Reactions  . Penicillins     itching  . Sumatriptan     REACTION: sob, headache  scalp sensitivity  . Oxycodone Itching    Past Medical History  Diagnosis Date  . GERD (gastroesophageal reflux disease)   . 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 Migraines   . CAD (coronary artery disease) 10/10/2011    NSTEMI with DES to the proximal RCA following flow wire evaluation. EF is normal.   . Hyperlipidemia   . Hypertension     Past Surgical History  Procedure Laterality Date  . Back surgery    . Shoulder surgery    . Hand surgery    . Coronary stent placement  10/13/2011    DES to the proximal RCA. Dr Martinique  . Inguinal hernia repair  10/23/2012    Procedure: HERNIA REPAIR INGUINAL ADULT;  Surgeon: Odis Hollingshead, MD;  Location: [place];  Service: General;  Laterality: Left;  Left lower quadrant; left inguinal hernia repair with mesh  . Insertion of mesh  10/23/2012    Procedure: INSERTION OF MESH;  Surgeon: Odis Hollingshead, MD;  Location: [place];  Service: General;  Laterality: Left;  Left lower quadrant  History  Smoking status  . Former Smoker  . Types: Cigarettes  . Quit date: 11/14/1982  Smokeless tobacco  . Never Used    Comment: Age 68    History  Alcohol Use  . 0.0 oz/week  . 5-6 Cans of beer per week    Family History  Problem Relation Age of Onset  . Cancer Mother     lung  . Heart disease Father     heart attack   . Cancer Father     colon    Review of Systems: The review of systems is per the HPI.  All other systems were reviewed and are negative.  Physical Exam: BP 136/54  Pulse 55  Wt 208 lb (94.348 kg) Patient is  pleasant and in no acute distress. Skin is warm and dry. Color is normal.  HEENT is unremarkable. Normocephalic/atraumatic. PERRL. Sclera are nonicteric. Neck is supple. No masses. No JVD. Lungs are clear. Cardiac exam shows a regular rate and rhythm.normal S1 and S2 without gallop or murmur. Abdomen is soft. No masses or bruits.Extremities are without edema.pedal pulses are good. Gait and ROM are intact. No  gross neurologic deficits noted.   LABORATORY DATA: Ecg: sinus brady, rate 55 bpm. Normal.  Assessment / Plan: 1. Her coronary disease status post drug-eluting stent to the proximal RCA in November 2012. Continue ASA, statin, metoprolol. Follow up in one year.  2. Hyperlipidemia. Will try and results of lab work from the [place].  3. Hypertension well controlled.

## 2014-01-14 ENCOUNTER — Encounter (HOSPITAL_BASED_OUTPATIENT_CLINIC_OR_DEPARTMENT_OTHER): Payer: Self-pay | Admitting: Emergency Medicine

## 2014-01-14 ENCOUNTER — Emergency Department (HOSPITAL_BASED_OUTPATIENT_CLINIC_OR_DEPARTMENT_OTHER): Payer: Medicare HMO

## 2014-01-14 ENCOUNTER — Observation Stay (HOSPITAL_BASED_OUTPATIENT_CLINIC_OR_DEPARTMENT_OTHER)
Admission: EM | Admit: 2014-01-14 | Discharge: 2014-01-15 | Disposition: A | Discharge status: Home / Self Care | Payer: Medicare HMO | Attending: Cardiovascular Disease | Admitting: Cardiovascular Disease

## 2014-01-14 DIAGNOSIS — G4730 Sleep apnea, unspecified: Secondary | ICD-10-CM

## 2014-01-14 DIAGNOSIS — K219 Gastro-esophageal reflux disease without esophagitis: Secondary | ICD-10-CM | POA: Insufficient documentation

## 2014-01-14 DIAGNOSIS — E785 Hyperlipidemia, unspecified: Secondary | ICD-10-CM | POA: Insufficient documentation

## 2014-01-14 DIAGNOSIS — Z8719 Personal history of other diseases of the digestive system: Secondary | ICD-10-CM | POA: Insufficient documentation

## 2014-01-14 DIAGNOSIS — I252 Old myocardial infarction: Secondary | ICD-10-CM | POA: Insufficient documentation

## 2014-01-14 DIAGNOSIS — G4733 Obstructive sleep apnea (adult) (pediatric): Secondary | ICD-10-CM | POA: Insufficient documentation

## 2014-01-14 DIAGNOSIS — I2510 Atherosclerotic heart disease of native coronary artery without angina pectoris: Secondary | ICD-10-CM | POA: Insufficient documentation

## 2014-01-14 DIAGNOSIS — E1169 Type 2 diabetes mellitus with other specified complication: Secondary | ICD-10-CM | POA: Diagnosis present

## 2014-01-14 DIAGNOSIS — R0602 Shortness of breath: Secondary | ICD-10-CM | POA: Insufficient documentation

## 2014-01-14 DIAGNOSIS — I152 Hypertension secondary to endocrine disorders: Secondary | ICD-10-CM | POA: Diagnosis present

## 2014-01-14 DIAGNOSIS — G43909 Migraine, unspecified, not intractable, without status migrainosus: Secondary | ICD-10-CM | POA: Insufficient documentation

## 2014-01-14 DIAGNOSIS — Z9189 Other specified personal risk factors, not elsewhere classified: Secondary | ICD-10-CM | POA: Diagnosis present

## 2014-01-14 DIAGNOSIS — I10 Essential (primary) hypertension: Secondary | ICD-10-CM | POA: Insufficient documentation

## 2014-01-14 DIAGNOSIS — R079 Chest pain, unspecified: Principal | ICD-10-CM

## 2014-01-14 DIAGNOSIS — E1159 Type 2 diabetes mellitus with other circulatory complications: Secondary | ICD-10-CM | POA: Diagnosis present

## 2014-01-14 HISTORY — DX: Obstructive sleep apnea (adult) (pediatric): G47.33

## 2014-01-14 HISTORY — DX: Personal history of peptic ulcer disease: Z87.11

## 2014-01-14 HISTORY — DX: Acute myocardial infarction, unspecified: I21.9

## 2014-01-14 HISTORY — DX: Squamous cell carcinoma of skin of unspecified ear and external auricular canal: C44.221

## 2014-01-14 HISTORY — DX: Personal history of other diseases of the digestive system: Z87.19

## 2014-01-14 HISTORY — DX: Dependence on other enabling machines and devices: Z99.89

## 2014-01-14 LAB — CBC
HCT: 42.4 % (ref 39.0–52.0)
Hemoglobin: 14.3 g/dL (ref 13.0–17.0)
MCH: 31.9 pg (ref 26.0–34.0)
MCHC: 33.7 g/dL (ref 30.0–36.0)
MCV: 94.6 fL (ref 78.0–100.0)
Platelets: 156 10*3/uL (ref 150–400)
RBC: 4.48 MIL/uL (ref 4.22–5.81)
RDW: 13.1 % (ref 11.5–15.5)
WBC: 7 10*3/uL (ref 4.0–10.5)

## 2014-01-14 LAB — BASIC METABOLIC PANEL
BUN: 14 mg/dL (ref 6–23)
CHLORIDE: 100 meq/L (ref 96–112)
CO2: 29 meq/L (ref 19–32)
Calcium: 9.5 mg/dL (ref 8.4–10.5)
Creatinine, Ser: 0.9 mg/dL (ref 0.50–1.35)
GFR calc Af Amer: >90 mL/min (ref >90)
GFR calc non Af Amer: 84 mL/min — ABNORMAL LOW (ref >90)
GLUCOSE: 161 mg/dL — AB (ref 70–99)
POTASSIUM: 4.1 meq/L (ref 3.7–5.3)
Sodium: 140 mEq/L (ref 137–147)

## 2014-01-14 LAB — TROPONIN I: Troponin I: <0.3 ng/mL (ref <0.30)

## 2014-01-14 LAB — CREATININE, SERUM
Creatinine, Ser: 0.95 mg/dL (ref 0.50–1.35)
GFR calc Af Amer: >90 mL/min (ref >90)
GFR calc non Af Amer: 82 mL/min — ABNORMAL LOW (ref >90)

## 2014-01-14 LAB — PROTIME-INR
INR: 0.89 (ref 0.00–1.49)
Prothrombin Time: 11.9 seconds (ref 11.6–15.2)

## 2014-01-14 LAB — CBC WITH DIFFERENTIAL/PLATELET
Basophils Absolute: 0 10*3/uL (ref 0.0–0.1)
Basophils Relative: 1 % (ref 0–1)
EOS ABS: 0 10*3/uL (ref 0.0–0.7)
Eosinophils Relative: 1 % (ref 0–5)
HCT: 43.1 % (ref 39.0–52.0)
HEMOGLOBIN: 14.6 g/dL (ref 13.0–17.0)
LYMPHS ABS: 1.6 10*3/uL (ref 0.7–4.0)
LYMPHS PCT: 27 % (ref 12–46)
MCH: 32.4 pg (ref 26.0–34.0)
MCHC: 33.9 g/dL (ref 30.0–36.0)
MCV: 95.8 fL (ref 78.0–100.0)
MONOS PCT: 7 % (ref 3–12)
Monocytes Absolute: 0.4 10*3/uL (ref 0.1–1.0)
NEUTROS ABS: 3.9 10*3/uL (ref 1.7–7.7)
NEUTROS PCT: 66 % (ref 43–77)
Platelets: 162 10*3/uL (ref 150–400)
RBC: 4.5 MIL/uL (ref 4.22–5.81)
RDW: 12.8 % (ref 11.5–15.5)
WBC: 5.9 10*3/uL (ref 4.0–10.5)

## 2014-01-14 LAB — APTT: aPTT: 31 seconds (ref 24–37)

## 2014-01-14 MED ORDER — OMEGA-3-ACID ETHYL ESTERS 1 G PO CAPS
1.0000 g | ORAL_CAPSULE | Freq: Every day | ORAL | Status: DC
  Administered 2014-01-15: 1 g via ORAL
  Filled 2014-01-14: qty 1

## 2014-01-14 MED ORDER — METOPROLOL TARTRATE 12.5 MG HALF TABLET
12.5000 mg | ORAL_TABLET | Freq: Two times a day (BID) | ORAL | Status: DC
  Filled 2014-01-14: qty 1

## 2014-01-14 MED ORDER — ONDANSETRON HCL 4 MG/2ML IJ SOLN: 4.0000 mg | Freq: Four times a day (QID) | INTRAMUSCULAR | Status: DC | PRN

## 2014-01-14 MED ORDER — EZETIMIBE 10 MG PO TABS
5.0000 mg | ORAL_TABLET | Freq: Every day | ORAL | Status: DC
  Administered 2014-01-15: 5 mg via ORAL
  Filled 2014-01-14: qty 0.5

## 2014-01-14 MED ORDER — ALPRAZOLAM 0.25 MG PO TABS: 0.2500 mg | ORAL_TABLET | Freq: Two times a day (BID) | ORAL | Status: DC | PRN

## 2014-01-14 MED ORDER — ENOXAPARIN SODIUM 40 MG/0.4ML ~~LOC~~ SOLN
40.0000 mg | SUBCUTANEOUS | Status: DC
  Administered 2014-01-14: 40 mg via SUBCUTANEOUS
  Filled 2014-01-14 (×2): qty 0.4

## 2014-01-14 MED ORDER — ZOLPIDEM TARTRATE 5 MG PO TABS: 5.0000 mg | ORAL_TABLET | Freq: Every evening | ORAL | Status: DC | PRN

## 2014-01-14 MED ORDER — OMEGA-3 FATTY ACIDS 1000 MG PO CAPS: 1.0000 g | ORAL_CAPSULE | Freq: Every day | ORAL | Status: DC

## 2014-01-14 MED ORDER — ASPIRIN 81 MG PO CHEW
324.0000 mg | CHEWABLE_TABLET | Freq: Once | ORAL | Status: AC
  Administered 2014-01-14: 324 mg via ORAL
  Filled 2014-01-14: qty 4

## 2014-01-14 MED ORDER — NITROGLYCERIN 0.4 MG SL SUBL: 0.4000 mg | SUBLINGUAL_TABLET | SUBLINGUAL | Status: DC | PRN

## 2014-01-14 MED ORDER — LORATADINE 10 MG PO TABS
10.0000 mg | ORAL_TABLET | Freq: Every day | ORAL | Status: DC
  Filled 2014-01-14: qty 1

## 2014-01-14 MED ORDER — ASPIRIN EC 81 MG PO TBEC
81.0000 mg | DELAYED_RELEASE_TABLET | Freq: Every day | ORAL | Status: DC
  Administered 2014-01-15: 81 mg via ORAL
  Filled 2014-01-14: qty 1

## 2014-01-14 MED ORDER — LEVOCETIRIZINE DIHYDROCHLORIDE 5 MG PO TABS: 5.0000 mg | ORAL_TABLET | Freq: Every evening | ORAL | Status: DC

## 2014-01-14 MED ORDER — TAMSULOSIN HCL 0.4 MG PO CAPS
0.4000 mg | ORAL_CAPSULE | Freq: Every day | ORAL | Status: DC | PRN
  Filled 2014-01-14: qty 1

## 2014-01-14 MED ORDER — TRAMADOL HCL 50 MG PO TABS: 50.0000 mg | ORAL_TABLET | Freq: Four times a day (QID) | ORAL | Status: DC | PRN

## 2014-01-14 MED ORDER — PANTOPRAZOLE SODIUM 40 MG PO TBEC
40.0000 mg | DELAYED_RELEASE_TABLET | Freq: Every day | ORAL | Status: DC
  Administered 2014-01-15: 40 mg via ORAL
  Filled 2014-01-14: qty 1

## 2014-01-14 MED ORDER — ATORVASTATIN CALCIUM 20 MG PO TABS
20.0000 mg | ORAL_TABLET | Freq: Every day | ORAL | Status: DC
  Filled 2014-01-14: qty 1

## 2014-01-14 MED ORDER — ACETAMINOPHEN 325 MG PO TABS: 650.0000 mg | ORAL_TABLET | ORAL | Status: DC | PRN

## 2014-01-14 NOTE — ED Notes (Signed)
Report given to [facility] RN. 

## 2014-01-14 NOTE — ED Notes (Signed)
Report to Sam,RN at [facility]

## 2014-01-14 NOTE — H&P (Addendum)
Patient ID: BRIGG CAPE MRN: [id number], DOB/AGE: 70-Aug-1947   Admit date: 01/14/2014   Primary Physician: Unice Cobble, MD Primary Cardiologist: Dr Martinique  HPI: Pleasant 68 y/o active male with a history of single vessel CAD. He is S/P RCA DES Nov 2012 in the setting of a NSTEMI. He has done well since. He has not had a stress test or cath and denies any recurrent chest pain till today. Last week he says he was shoveling snow without a problem. Today he went to lunch with his wife at the [facility]. As they were leaving he developed mid sternal tightness with slight SOB. He denies any diaphoresis or nausea. There was no radiation to his jaw or arms. This chest pain was a little different then his NSTEMI pain (Lt sided chest pain with nausea, SOB). He decided to go to [facility] in [place]. His EKG is without acute changes, his Troponin is negative x 1. He was transferred to [facility] for further evaluation. He is currently pain free.    Problem List: Past Medical History  Diagnosis Date  . GERD (gastroesophageal reflux disease)   . Migraines   . CAD (coronary artery disease) 10/10/2011    NSTEMI with DES to the proximal RCA following flow wire evaluation. EF is normal.   . Hyperlipidemia   . Hypertension     Past Surgical History  Procedure Laterality Date  . Back surgery    . Shoulder surgery    . Hand surgery    . Coronary stent placement  10/13/2011    DES to the proximal RCA. Dr Martinique  . Inguinal hernia repair  10/23/2012    Procedure: HERNIA REPAIR INGUINAL ADULT;  Surgeon: Odis Hollingshead, MD;  Location: [place];  Service: General;  Laterality: Left;  Left lower quadrant; left inguinal hernia repair with mesh  . Insertion of mesh  10/23/2012    Procedure: INSERTION OF MESH;  Surgeon: Odis Hollingshead, MD;  Location: [place];  Service: General;  Laterality: Left;  Left lower quadrant     Allergies:  Allergies  Allergen Reactions  . Penicillins     itching  .  Sumatriptan     REACTION: sob, headache scalp sensitivity  . Oxycodone Itching     Home Medications No current facility-administered medications for this encounter.     Family History  Problem Relation Age of Onset  . Cancer Mother     lung  . Heart disease Father     heart attack   . Cancer Father     colon     History   Social History  . Marital Status: Married    Spouse Name: N/A    Number of Children: N/A  . Years of Education: N/A   Occupational History  . Not on file.   Social History Main Topics  . Smoking status: Former Smoker    Types: Cigarettes    Quit date: 11/14/1982  . Smokeless tobacco: Never Used     Comment: Age 25  . Alcohol Use: 0.0 oz/week    5-6 Cans of beer per week  . Drug Use: No  . Sexual Activity: No   Other Topics Concern  . Not on file   Social History Narrative  . No narrative on file     Review of Systems: General: negative for chills, fever, night sweats or weight changes.  Cardiovascular: negative for dyspnea on exertion, edema, orthopnea, palpitations, paroxysmal nocturnal dyspnea or shortness  of breath Dermatological: negative for rash Respiratory: negative for cough or wheezing Urologic: negative for hematuria Abdominal: negative for nausea, vomiting, diarrhea, bright red blood per rectum, melena, or hematemesis Neurologic: negative for visual changes, syncope, or dizziness All other systems reviewed and are otherwise negative except as noted above.  Physical Exam: Blood pressure 147/63, pulse 72, temperature 98.2 F (36.8 C), temperature source Oral, resp. rate 16, height 6\' 1"  (1.854 m), weight 215 lb (97.523 kg), SpO2 95.00%.  General appearance: alert, cooperative and no distress Neck: no carotid bruit and no JVD Lungs: clear to auscultation bilaterally Heart: regular rate and rhythm Abdomen: soft, non-tender; bowel sounds normal; no masses,  no organomegaly Extremities: extremities normal, atraumatic, no  cyanosis or edema Pulses: 2+ and symmetric Skin: Skin color, texture, turgor normal. No rashes or lesions Neurologic: Grossly normal    Labs:   Results for orders placed during the hospital encounter of 01/14/14 (from the past 24 hour(s))  CBC WITH DIFFERENTIAL     Status: None   Collection Time    01/14/14  2:20 PM      Result Value Ref Range   WBC 5.9  4.0 - 10.5 K/uL   RBC 4.50  4.22 - 5.81 MIL/uL   Hemoglobin 14.6  13.0 - 17.0 g/dL   HCT 43.1  39.0 - 52.0 %   MCV 95.8  78.0 - 100.0 fL   MCH 32.4  26.0 - 34.0 pg   MCHC 33.9  30.0 - 36.0 g/dL   RDW 12.8  11.5 - 15.5 %   Platelets 162  150 - 400 K/uL   Neutrophils Relative % 66  43 - 77 %   Neutro Abs 3.9  1.7 - 7.7 K/uL   Lymphocytes Relative 27  12 - 46 %   Lymphs Abs 1.6  0.7 - 4.0 K/uL   Monocytes Relative 7  3 - 12 %   Monocytes Absolute 0.4  0.1 - 1.0 K/uL   Eosinophils Relative 1  0 - 5 %   Eosinophils Absolute 0.0  0.0 - 0.7 K/uL   Basophils Relative 1  0 - 1 %   Basophils Absolute 0.0  0.0 - 0.1 K/uL  BASIC METABOLIC PANEL     Status: Abnormal   Collection Time    01/14/14  2:20 PM      Result Value Ref Range   Sodium 140  137 - 147 mEq/L   Potassium 4.1  3.7 - 5.3 mEq/L   Chloride 100  96 - 112 mEq/L   CO2 29  19 - 32 mEq/L   Glucose, Bld 161 (*) 70 - 99 mg/dL   BUN 14  6 - 23 mg/dL   Creatinine, Ser 0.90  0.50 - 1.35 mg/dL   Calcium 9.5  8.4 - 10.5 mg/dL   GFR calc non Af Amer 84 (*) >90 mL/min   GFR calc Af Amer >90  >90 mL/min  APTT     Status: None   Collection Time    01/14/14  2:20 PM      Result Value Ref Range   aPTT 31  24 - 37 seconds  PROTIME-INR     Status: None   Collection Time    01/14/14  2:20 PM      Result Value Ref Range   Prothrombin Time 11.9  11.6 - 15.2 seconds   INR 0.89  0.00 - 1.49  TROPONIN I     Status: None   Collection Time  01/14/14  2:20 PM      Result Value Ref Range   Troponin I <0.30  <0.30 ng/mL     Radiology/Studies: Dg Chest 2 View  01/14/2014    CLINICAL DATA:  Chest pain and shortness of breath.  EXAM: CHEST  2 VIEW  COMPARISON:  Chest x-ray 10/16/2012.  FINDINGS: Lung volumes are normal. No consolidative airspace disease. No pleural effusions. No pneumothorax. Tiny calcified granuloma in the right mid lung is unchanged. No other suspicious appearing pulmonary nodule or mass noted. Pulmonary vasculature and the cardiomediastinal silhouette are within normal limits. Atherosclerosis in the thoracic aorta.  IMPRESSION: 1. No radiographic evidence of acute cardiopulmonary disease. 2. Atherosclerosis.   Electronically Signed   By: Vinnie Langton M.D.   On: 01/14/2014 14:41    EKG: NSR without acute changes  ASSESSMENT AND PLAN:  Principal Problem:   Chest pain with minimal risk of acute coronary syndrome Active Problems:   HTN (hypertension)   CAD - RCA DES Nov 2012   HYPERLIPIDEMIA   SLEEP APNEA   PLAN: Admit for obs. GXT Myoview in am.    Signed, Erlene Quan, PA-C 01/14/2014, 6:12 PM   Patient seen and examined. Agree with assessment and plan. Very pleasant 68 yo WM who underwent DES stent placement to the RCA in 11/13 with a Promus 3.5 x 20 stent and has done well since. He has OSA on Bi-PAP therapy. He has been without recurrent chest pain or change in exercise tolerance.  He developed a different sensation today after having country barbecue. Pain was intermittent and lasted for `~ 30 min. He was seen at [facility] and is transferred for further evaluation. ECG is without acute change. Pt has not had a nuclear evaluation post his intervention. Will cycle enzymes, ECG in am and plan for exercise nuclear study tomorrow. Will hold beta blocker for the study.  Troy Sine, MD, [facility] 01/14/2014 6:21 PM

## 2014-01-14 NOTE — ED Provider Notes (Addendum)
CSN: [id number]     Arrival date & time 01/14/14  1344 History   First MD Initiated Contact with Patient 01/14/14 1356     Chief Complaint  Patient presents with  . Chest Pain     (Consider location/radiation/quality/duration/timing/severity/associated sxs/prior Treatment) Patient is a 68 y.o. male presenting with chest pain. The history is provided by the patient.  Chest Pain Pain location:  Substernal area Pain quality: aching and pressure   Pain radiates to:  Does not radiate Pain radiates to the back: no   Pain severity:  Moderate Onset quality:  Sudden Duration:  20 minutes Timing:  Constant Progression:  Resolved Chronicity:  New Context: at rest   Context comment:  Started after eating lunch Relieved by:  Nothing Worsened by:  Nothing tried Ineffective treatments:  None tried Associated symptoms: shortness of breath   Associated symptoms: no abdominal pain, no fever, no nausea, not vomiting and no weakness   Risk factors: coronary artery disease, high cholesterol, hypertension and male sex   Risk factors: no diabetes mellitus, no smoking and no surgery     Past Medical History  Diagnosis Date  . GERD (gastroesophageal reflux disease)   . 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 Migraines   . CAD (coronary artery disease) 10/10/2011    NSTEMI with DES to the proximal RCA following flow wire evaluation. EF is normal.   . Hyperlipidemia   . Hypertension    Past Surgical History  Procedure Laterality Date  . Back surgery    . Shoulder surgery    . Hand surgery    . Coronary stent placement  10/13/2011    DES to the proximal RCA. Dr Martinique  . Inguinal hernia repair  10/23/2012    Procedure: HERNIA REPAIR INGUINAL ADULT;  Surgeon: Odis Hollingshead, MD;  Location: [facility];  Service: General;  Laterality: Left;  Left lower quadrant; left inguinal hernia repair with mesh  . Insertion of mesh  10/23/2012    Procedure: INSERTION OF MESH;  Surgeon: Odis Hollingshead, MD;   Location: [place];  Service: General;  Laterality: Left;  Left lower quadrant   Family History  Problem Relation Age of Onset  . Cancer Mother     lung  . Heart disease Father     heart attack   . Cancer Father     colon   History  Substance Use Topics  . Smoking status: Former Smoker    Types: Cigarettes    Quit date: 11/14/1982  . Smokeless tobacco: Never Used     Comment: Age 37  . Alcohol Use: 0.0 oz/week    5-6 Cans of beer per week    Review of Systems  Constitutional: Negative for fever.  Respiratory: Positive for shortness of breath.   Cardiovascular: Positive for chest pain.  Gastrointestinal: Negative for nausea, vomiting and abdominal pain.  Neurological: Negative for weakness.  All other systems reviewed and are negative.      Allergies  Penicillins; Sumatriptan; and Oxycodone  Home Medications   Current Outpatient Rx  Name  Route  Sig  Dispense  Refill  . aspirin 81 MG tablet   Oral   Take 81 mg by mouth daily.           Marland Kitchen atorvastatin (LIPITOR) 40 MG tablet   Oral   Take 20 mg by mouth daily.         . Cholecalciferol (VITAMIN D) 2000 UNITS CAPS  Oral   Take 1 capsule by mouth daily.         Marland Kitchen ezetimibe (ZETIA) 10 MG tablet   Oral   Take 5 mg by mouth daily.         . fish oil-omega-3 fatty acids 1000 MG capsule   Oral   Take 1 g by mouth daily.         Marland Kitchen levocetirizine (XYZAL) 5 MG tablet   Oral   Take 5 mg by mouth every evening.          . metoprolol tartrate (LOPRESSOR) 25 MG tablet   Oral   Take 0.5 tablets (12.5 mg total) by mouth 2 (two) times daily.   90 tablet   3   . EXPIRED: mometasone (NASONEX) 50 MCG/ACT nasal spray   Nasal   Place 2 sprays into the nose daily.   17 g   3   . Multiple Vitamins-Minerals (MULTIVITAMINS THER. W/MINERALS) TABS   Oral   Take 1 tablet by mouth daily.         Marland Kitchen EXPIRED: nitroGLYCERIN (NITROSTAT) 0.4 MG SL tablet   Sublingual   Place 1 tablet (0.4 mg total) under the  tongue every 5 (five) minutes as needed for chest pain.   25 tablet   3   . pantoprazole (PROTONIX) 40 MG tablet   Oral   Take 1 tablet (40 mg total) by mouth daily.   90 tablet   3   . Tamsulosin HCl (FLOMAX) 0.4 MG CAPS   Oral   Take 0.4 mg by mouth daily as needed. For prostate         . traMADol (ULTRAM) 50 MG tablet   Oral   Take 50 mg by mouth every 6 (six) hours as needed. For pain         . vitamin C (ASCORBIC ACID) 500 MG tablet   Oral   Take 500 mg by mouth daily.          BP 163/62  Pulse 75  Temp(Src) 97.8 F (36.6 C)  Resp 21  Ht 6\' 1"  (1.854 m)  Wt 215 lb (97.523 kg)  BMI 28.37 kg/m2  SpO2 100% Physical Exam  Nursing note and vitals reviewed. Constitutional: He is oriented to person, place, and time. He appears well-developed and well-nourished. No distress.  HENT:  Head: Normocephalic and atraumatic.  Mouth/Throat: Oropharynx is clear and moist.  Eyes: Conjunctivae and EOM are normal. Pupils are equal, round, and reactive to light.  Neck: Normal range of motion. Neck supple.  Cardiovascular: Normal rate, regular rhythm and intact distal pulses.   No murmur heard. Pulmonary/Chest: Effort normal and breath sounds normal. No respiratory distress. He has no wheezes. He has no rales.  Abdominal: Soft. He exhibits no distension. There is no tenderness. There is no rebound and no guarding.  Musculoskeletal: Normal range of motion. He exhibits no edema and no tenderness.  Neurological: He is alert and oriented to person, place, and time.  Skin: Skin is warm and dry. No rash noted. No erythema.  Psychiatric: He has a normal mood and affect. His behavior is normal.    ED Course  Procedures (including critical care time) Labs Review Labs Reviewed  BASIC METABOLIC PANEL - Abnormal; Notable for the following:    Glucose, Bld 161 (*)    GFR calc non Af Amer 84 (*)    All other components within normal limits  CBC WITH DIFFERENTIAL  APTT  PROTIME-INR  TROPONIN I   Imaging Review Dg Chest 2 View  01/14/2014   CLINICAL DATA:  Chest pain and shortness of breath.  EXAM: CHEST  2 VIEW  COMPARISON:  Chest x-ray 10/16/2012.  FINDINGS: Lung volumes are normal. No consolidative airspace disease. No pleural effusions. No pneumothorax. Tiny calcified granuloma in the right mid lung is unchanged. No other suspicious appearing pulmonary nodule or mass noted. Pulmonary vasculature and the cardiomediastinal silhouette are within normal limits. Atherosclerosis in the thoracic aorta.  IMPRESSION: 1. No radiographic evidence of acute cardiopulmonary disease. 2. Atherosclerosis.   Electronically Signed   By: Vinnie Langton M.D.   On: 01/14/2014 14:41     EKG Interpretation   Date/Time:  Tuesday January 14 2014 13:54:06 EST Ventricular Rate:  75 PR Interval:  166 QRS Duration: 84 QT Interval:  394 QTC Calculation: 439 R Axis:   57 Text Interpretation:  Normal sinus rhythm Normal ECG No significant change  since last tracing Confirmed by Maryan Rued  MD, Loree Fee ([id number]) on 01/14/2014  1:56:14 PM      MDM   Final diagnoses:  Chest pain    Pt with symptoms concerning for ACS.  Associated symptoms include SOB.  Pt with prior hx of NSTEMI with stent placement 2 years ago and no pain until today.  Low suspicion for PE and no infectious resp sx.   ASA given.  Pt has been pain free since being here. EKG, CXR, CBC, BMP, CE, Coags pending.  2:55 PM Pt with initially normal labs, EKG and CXR however concerning story and sx started 35min prior to arrival.  Will discuss with cardiology.  Pt remains CP free at this time.  Blanchie Dessert, MD 01/14/14 1455  Blanchie Dessert, MD 01/14/14 1515

## 2014-01-14 NOTE — ED Notes (Signed)
Pt reports 15 min pta with substernal cp, no radiation. Reported nausea and sob.

## 2014-01-15 ENCOUNTER — Observation Stay (HOSPITAL_COMMUNITY): Payer: Medicare HMO

## 2014-01-15 DIAGNOSIS — R079 Chest pain, unspecified: Secondary | ICD-10-CM

## 2014-01-15 LAB — TROPONIN I: Troponin I: <0.3 ng/mL (ref <0.30)

## 2014-01-15 LAB — LIPID PANEL
Cholesterol: 207 mg/dL — ABNORMAL HIGH (ref 0–200)
HDL: 58 mg/dL (ref >39)
LDL Cholesterol: 114 mg/dL — ABNORMAL HIGH (ref 0–99)
Total CHOL/HDL Ratio: 3.6 RATIO
Triglycerides: 175 mg/dL — ABNORMAL HIGH (ref <150)
VLDL: 35 mg/dL (ref 0–40)

## 2014-01-15 MED ORDER — NITROGLYCERIN 0.4 MG/SPRAY TL SOLN: 1.0000 | Status: DC | PRN

## 2014-01-15 MED ORDER — TECHNETIUM TC 99M SESTAMIBI GENERIC - CARDIOLITE
30.0000 | Freq: Once | INTRAVENOUS | Status: AC | PRN
  Administered 2014-01-15: 30 via INTRAVENOUS

## 2014-01-15 MED ORDER — TECHNETIUM TC 99M SESTAMIBI GENERIC - CARDIOLITE
10.0000 | Freq: Once | INTRAVENOUS | Status: AC | PRN
  Administered 2014-01-15: 10 via INTRAVENOUS

## 2014-01-15 NOTE — Progress Notes (Signed)
D/c orders received;IV removed with gauze on, pt remains in stable condition, pt meds and instructions reviewed and given to pt; pt d/c to home 

## 2014-01-15 NOTE — Progress Notes (Signed)
Patient: Robert [name] / Admit Date: 01/14/2014 / Date of Encounter: 01/15/2014, 10:37 AM   Subjective  No CP or SOB this AM. Exercised 7:30 on Bruce protocol with slight ST depression II, III, avF, V4-V6. No CP but did have SOB at end of exercise.  Objective   Telemetry: MD to review when patient is back on floor (seen on nuc)  Physical Exam: Blood pressure 120/60, pulse 68, temperature 97.9 F (36.6 C), temperature source Oral, resp. rate 16, height 6\' 1"  (1.854 m), weight 215 lb (97.523 kg), SpO2 97.00%. General: Well developed, well nourished WM in no acute distress. Head: Normocephalic, atraumatic, sclera non-icteric, no xanthomas, nares are without discharge. Neck: Negative for carotid bruits. JVP not elevated. Lungs: Clear bilaterally to auscultation without wheezes, rales, or rhonchi. Breathing is unlabored. Heart: RRR S1 S2 without murmurs, rubs, or gallops.  Abdomen: Soft, non-tender, non-distended with normoactive bowel sounds. No rebound/guarding. Extremities: No clubbing or cyanosis. No edema. Distal pedal pulses are 2+ and equal bilaterally. Neuro: Alert and oriented X 3. Moves all extremities spontaneously. Psych:  Responds to questions appropriately with a normal affect.  No intake or output data in the 24 hours ending 01/15/14 1037  Inpatient Medications:  . aspirin EC  81 mg Oral Daily  . atorvastatin  20 mg Oral q1800  . enoxaparin (LOVENOX) injection  40 mg Subcutaneous Q24H  . ezetimibe  5 mg Oral Daily  . loratadine  10 mg Oral QAC supper  . metoprolol tartrate  12.5 mg Oral BID  . omega-3 acid ethyl esters  1 g Oral Daily  . pantoprazole  40 mg Oral Daily   Infusions:    Labs:  Recent Labs  01/14/14 1420 01/14/14 1949  NA 140  --   K 4.1  --   CL 100  --   CO2 29  --   GLUCOSE 161*  --   BUN 14  --   CREATININE 0.90 0.95  CALCIUM 9.5  --    No results found for this basename: AST, ALT, ALKPHOS, BILITOT, PROT, ALBUMIN,  in the last 72  hours  Recent Labs  01/14/14 1420 01/14/14 1949  WBC 5.9 7.0  NEUTROABS 3.9  --   HGB 14.6 14.3  HCT 43.1 42.4  MCV 95.8 94.6  PLT 162 156    Recent Labs  01/14/14 1420 01/14/14 1949 01/15/14 0431 01/15/14 0906  TROPONINI <0.30 <0.30 <0.30 <0.30   Radiology/Studies:  Dg Chest 2 View 01/14/2014   CLINICAL DATA:  Chest pain and shortness of breath.  EXAM: CHEST  2 VIEW  COMPARISON:  Chest x-ray 10/16/2012.  FINDINGS: Lung volumes are normal. No consolidative airspace disease. No pleural effusions. No pneumothorax. Tiny calcified granuloma in the right mid lung is unchanged. No other suspicious appearing pulmonary nodule or mass noted. Pulmonary vasculature and the cardiomediastinal silhouette are within normal limits. Atherosclerosis in the thoracic aorta.  IMPRESSION: 1. No radiographic evidence of acute cardiopulmonary disease. 2. Atherosclerosis.   Electronically Signed   By: Vinnie Langton M.D.   On: 01/14/2014 14:41     Assessment and Plan  1. Chest pain 2. CAD s/p prior stenting 3. HTN 4. Sleep apnea 5. Hyperlipidemia  Consider increase in Lipitor as LDL 114. Await nuc results. Nursing note indicates patient requests NTG refill at dc.  Signed, Melina Copa PA-C  Exercise Nuclear Study IMPRESSION:  1. Negative for pharmacologic-stress induced ischemia.  2. Left ventricular ejection fraction 57%.    Patient seen and examined.  Agree with assessment and plan. Mld STT changes on exercise with normal myocardial perfusion arguing against restenosis. Resume beta blocker which was held for the study. ? If esophageal spasm in etiology of yesterday's discomfort following a vinegar based sauce. DC today; f/u with Dr. Martinique.   Troy Sine, MD, [facility] 01/15/2014 2:35 PM

## 2014-01-15 NOTE — Discharge Summary (Signed)
Physician Discharge Summary  Patient ID: Robert [name] MRN: [id number] DOB/AGE: 70-21-1947 70 y.o.  Admit date: 01/14/2014 Discharge date: 01/15/2014 Primary Cardiologist: Dr. Martinique  Admission Diagnoses: Chest Pain  Discharge Diagnoses:  Principal Problem:   Chest pain with minimal risk of acute coronary syndrome Active Problems:   HYPERLIPIDEMIA   SLEEP APNEA   HTN (hypertension)   CAD - RCA DES Nov 2012   Chest pain with moderate risk of acute coronary syndrome   Discharged Condition: stable  Hospital Course: The patient is a 68 y/o male, followed by Dr. Martinique, with known CAD, s/p DES to the RCA in 2012, in the setting of NSTEMI, who presented to [facility] on 01/14/14 with a complaint of chest pain. He reported mid sternal tightness with associated SOB. His chest discomfort developed shortly after eating vinegar based barbecue. He initially presented to [place], but was transferred to [facility] for further evaluation. Initial troponin was negative and his EKG was w/o acute change. Given his past cardiac history and new symptoms, it was decided to admit for further work-up and observation. Cardiac enzymes continued to be cycled and were negative x 3. He later underwent a nuclear exercise stress test using the Bruce protocol. His BB was held prior to the test. He exercised on the treadmill for a total of 7 min and 30 sec w/o chest pain. He did however have slight ST depressions in II, III, avF, V4-V6. The nuclear images showed no evidence of pharmacologic stress induced ischemia. Left ventricular ejection fraction was estimated at 57%. It was felt highly likely that his chest pain was noncardiac. Possible esophageal spasm was considered a possible etiology, given that his symptoms occurred shortly after consuming a vinegar based barbeque sauce. The patient was last seen and examined by Dr. Claiborne Billings, who determined he was stable for discharge home. He was continued on his BB and PPI and was also  prescribed PRN SL NTG spray. He will f/u with Dr. Martinique in clinic on 02/13/14.    Consults: None  Significant Diagnostic Studies:   NST 01/15/14 FINDINGS: The stress SPECT images demonstrate physiologic distribution of radiopharmaceutical. Rest images demonstrate no perfusion defects.  The gated stress SPECT images demonstrate normal left ventricular myocardial thickening. No focal wall motion abnormality is seen.  Calculated left ventricular end-diastolic volume 73 mL, end-systolic volume 32 mL, ejection fraction of 57%.  IMPRESSION: 1. Negative for pharmacologic-stress induced ischemia.  2. Left ventricular ejection fraction 57%.   Treatments: See Hospital Course  Discharge Exam: Blood pressure 132/60, pulse 76, temperature 97.7 F (36.5 C), temperature source Oral, resp. rate 16, height 6\' 1"  (1.854 m), weight 215 lb (97.523 kg), SpO2 95.00%.   Disposition: 01-Home or Self Care      Discharge Orders   Future Appointments Provider Department Dept Phone   02/13/2014 11:30 AM Peter M Martinique, MD [facility] Office [phone]   Future Orders Complete By Expires   Diet - low sodium heart healthy  As directed    Increase activity slowly  As directed        Medication List    STOP taking these medications       nitroGLYCERIN 0.4 MG SL tablet  Commonly known as:  NITROSTAT  Replaced by:  nitroGLYCERIN 0.4 MG/SPRAY spray      TAKE these medications       aspirin EC 81 MG tablet  Take 81 mg by mouth daily.     atorvastatin 40 MG tablet  Commonly known as:  LIPITOR  Take 20 mg by mouth daily.     ezetimibe 10 MG tablet  Commonly known as:  ZETIA  Take 5 mg by mouth daily.     fish oil-omega-3 fatty acids 1000 MG capsule  Take 1 g by mouth daily.     levocetirizine 5 MG tablet  Commonly known as:  XYZAL  Take 5 mg by mouth every evening.     metoprolol tartrate 25 MG tablet  Commonly known as:  LOPRESSOR  Take 12.5 mg by mouth daily.      mometasone 50 MCG/ACT nasal spray  Commonly known as:  NASONEX  Place 2 sprays into the nose daily.     mometasone 50 MCG/ACT nasal spray  Commonly known as:  NASONEX  Place 2 sprays into the nose daily.     multivitamins ther. w/minerals Tabs tablet  Take 1 tablet by mouth daily.     nitroGLYCERIN 0.4 MG SL tablet  Commonly known as:  NITROSTAT  Place 0.4 mg under the tongue every 5 (five) minutes as needed for chest pain.     nitroGLYCERIN 0.4 MG/SPRAY spray  Commonly known as:  NITROLINGUAL  Place 1 spray under the tongue every 5 (five) minutes x 3 doses as needed for chest pain.     pantoprazole 40 MG tablet  Commonly known as:  PROTONIX  Take 1 tablet (40 mg total) by mouth daily.     traMADol 50 MG tablet  Commonly known as:  ULTRAM  Take 50 mg by mouth every 6 (six) hours as needed. For pain     vitamin C 500 MG tablet  Commonly known as:  ASCORBIC ACID  Take 500 mg by mouth daily.     Vitamin D 2000 UNITS Caps  Take 1 capsule by mouth daily.       Follow-up Information   Follow up with Peter Martinique, MD On 02/13/2014. (11:30 am )    Specialty:  Cardiology   Contact information:   [address] [place] [address] [phone]      TIME SPENT ON DISCHARGE, INCLUDING PHYSICIAN TIME: >30 MINUTES  Signed: Lyda Jester 01/15/2014, 3:10 PM

## 2014-02-13 ENCOUNTER — Encounter: Payer: Self-pay | Admitting: Cardiology

## 2014-02-13 ENCOUNTER — Ambulatory Visit (INDEPENDENT_AMBULATORY_CARE_PROVIDER_SITE_OTHER): Payer: Medicare HMO | Admitting: Cardiology

## 2014-02-13 VITALS — BP 136/70 | HR 69 | Ht 73.0 in | Wt 216.8 lb

## 2014-02-13 DIAGNOSIS — I2510 Atherosclerotic heart disease of native coronary artery without angina pectoris: Secondary | ICD-10-CM

## 2014-02-13 DIAGNOSIS — E782 Mixed hyperlipidemia: Secondary | ICD-10-CM

## 2014-02-13 DIAGNOSIS — I10 Essential (primary) hypertension: Secondary | ICD-10-CM

## 2014-02-13 MED ORDER — ROSUVASTATIN CALCIUM 40 MG PO TABS: 20.0000 mg | ORAL_TABLET | Freq: Every day | ORAL | Status: DC

## 2014-02-13 NOTE — Patient Instructions (Signed)
Continue your current therapy  I will see you in one year   

## 2014-02-13 NOTE — Progress Notes (Signed)
Robert [name] Date of Birth: Jun 28, 1946 Medical Record #[id number]  History of Present Illness: Mr. Birchall is seen today for a followup visit.  He has had a  NSTEMI with DES to the proximal RCA in November 2012. EF was normal. Recently he was admitted to the hospital after experiencing acute chest tightness while eating vinegar based Bar-BQ chicken. He ruled out for MI. Subsequent Stress Myoview was Normal. He has no recurrent chest pain. He remains very active. Recent lipids in the hospital looked higher but he had mixed up some of his pills for one month and missed some doses. Labs from the [place] looked better with LDL of 89 and trig. 114.  Current Outpatient Prescriptions on File Prior to Visit  Medication Sig Dispense Refill  . aspirin EC 81 MG tablet Take 81 mg by mouth daily.      . Cholecalciferol (VITAMIN D) 2000 UNITS CAPS Take 1 capsule by mouth daily.      Marland Kitchen ezetimibe (ZETIA) 10 MG tablet Take 5 mg by mouth daily.      . fish oil-omega-3 fatty acids 1000 MG capsule Take 1 g by mouth daily.      Marland Kitchen levocetirizine (XYZAL) 5 MG tablet Take 5 mg by mouth every evening.       . metoprolol tartrate (LOPRESSOR) 25 MG tablet Take 12.5 mg by mouth daily.      . mometasone (NASONEX) 50 MCG/ACT nasal spray Place 2 sprays into the nose daily.      . Multiple Vitamins-Minerals (MULTIVITAMINS THER. W/MINERALS) TABS Take 1 tablet by mouth daily.      . nitroGLYCERIN (NITROLINGUAL) 0.4 MG/SPRAY spray Place 1 spray under the tongue every 5 (five) minutes x 3 doses as needed for chest pain.  12 g  12  . nitroGLYCERIN (NITROSTAT) 0.4 MG SL tablet Place 0.4 mg under the tongue every 5 (five) minutes as needed for chest pain.      . pantoprazole (PROTONIX) 40 MG tablet Take 1 tablet (40 mg total) by mouth daily.  90 tablet  3  . traMADol (ULTRAM) 50 MG tablet Take 50 mg by mouth every 6 (six) hours as needed. For pain      . vitamin C (ASCORBIC ACID) 500 MG tablet Take 500 mg by mouth daily.       No  current facility-administered medications on file prior to visit.    Allergies  Allergen Reactions  . Penicillins     itching  . Sumatriptan     REACTION: sob, headache scalp sensitivity  . Oxycodone Itching    Past Medical History  Diagnosis Date  . GERD (gastroesophageal reflux disease)   . CAD (coronary artery disease) 10/10/2011    NSTEMI with DES to the proximal RCA following flow wire evaluation. EF is normal.   . Hyperlipidemia   . Hypertension   . 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 Myocardial infarction 10/2011  . OSA on CPAP     "wear it most of the time" (01/14/2014)  . History of stomach ulcers   . H/O esophageal ulcer   . Migraines     "quite often; maybe q 10d to 2 wk" (01/14/2014)    Past Surgical History  Procedure Laterality Date  . Back surgery    . Shoulder arthroscopy w/ rotator cuff repair Left 1990's  . Trigger finger release Left     3rd and 4th digits  . Coronary angioplasty with stent  placement  10/13/2011    DES to the proximal RCA. Dr Martinique  . Inguinal hernia repair  10/23/2012    Procedure: HERNIA REPAIR INGUINAL ADULT;  Surgeon: Odis Hollingshead, MD;  Location: [place];  Service: General;  Laterality: Left;  Left lower quadrant; left inguinal hernia repair with mesh  . Insertion of mesh  10/23/2012    Procedure: INSERTION OF MESH;  Surgeon: Odis Hollingshead, MD;  Location: [place];  Service: General;  Laterality: Left;  Left lower quadrant  . Hernia repair    . Cervical disc surgery  1980's?    History  Smoking status  . Former Smoker -- 2.00 packs/day for 20 years  . Types: Cigarettes  . Quit date: 11/14/1982  Smokeless tobacco  . Never Used    History  Alcohol Use  . 3.6 oz/week  . 6 Cans of beer per week    Family History  Problem Relation Age of Onset  . Cancer Mother     lung  . Heart disease Father     heart attack   . Cancer Father     colon    Review of Systems: The review of systems is per the  HPI.  All other systems were reviewed and are negative.  Physical Exam: BP 136/70  Pulse 69  Ht 6\' 1"  (1.854 m)  Wt 216 lb 12.8 oz (98.34 kg)  BMI 28.61 kg/m2  SpO2 96% Patient is  pleasant and in no acute distress. Skin is warm and dry. Color is normal.  HEENT is unremarkable. Normocephalic/atraumatic. PERRL. Sclera are nonicteric. Neck is supple. No masses. No JVD. Lungs are clear. Cardiac exam shows a regular rate and rhythm.normal S1 and S2 without gallop or murmur. Abdomen is soft. No masses or bruits.Extremities are without edema.pedal pulses are good. Gait and ROM are intact. No gross neurologic deficits noted.   LABORATORY DATA: Ecg: sinus brady, rate 75 bpm. Normal.  EXAM: MYOCARDIAL IMAGING WITH SPECT (REST AND EXERCISE)  GATED LEFT VENTRICULAR WALL MOTION STUDY  LEFT VENTRICULAR EJECTION FRACTION  TECHNIQUE: Standard myocardial SPECT imaging was performed after resting intravenous injection of 10 mCi Tc-71m sestamibi. Subsequently, exercise tolerance test was performed by the patient under the supervision of the Cardiology staff. At peak-stress, 30 mCi Tc-4m sestamibi was injected intravenously and standard myocardial SPECT imaging was performed. Quantitative gated imaging was also performed to evaluate left ventricular wall motion, and estimate left ventricular ejection fraction.  COMPARISON: None.  FINDINGS: The stress SPECT images demonstrate physiologic distribution of radiopharmaceutical. Rest images demonstrate no perfusion defects.  The gated stress SPECT images demonstrate normal left ventricular myocardial thickening. No focal wall motion abnormality is seen.  Calculated left ventricular end-diastolic volume 73 mL, end-systolic volume 32 mL, ejection fraction of 57%.  IMPRESSION: 1. Negative for pharmacologic-stress induced ischemia.  2. Left ventricular ejection fraction 57%.   Electronically Signed By: Julian Hy M.D. On: 01/15/2014  13:16    Assessment / Plan: 1. Coronary artery disease status post drug-eluting stent to the proximal RCA in November 2012. Recent myoview was normal. Continue ASA, statin, metoprolol. Follow up in one year.  2. Hyperlipidemia. Continue crestor and Zetia  3. Hypertension well controlled.

## 2014-03-07 ENCOUNTER — Ambulatory Visit: Payer: Medicare HMO | Admitting: Family Medicine

## 2014-03-27 ENCOUNTER — Ambulatory Visit (INDEPENDENT_AMBULATORY_CARE_PROVIDER_SITE_OTHER): Payer: Commercial Managed Care - HMO | Admitting: Internal Medicine

## 2014-03-27 ENCOUNTER — Encounter: Payer: Self-pay | Admitting: Internal Medicine

## 2014-03-27 VITALS — BP 122/60 | HR 58 | Temp 97.2°F | Resp 13 | Wt 218.0 lb

## 2014-03-27 DIAGNOSIS — R51 Headache: Secondary | ICD-10-CM

## 2014-03-27 DIAGNOSIS — E782 Mixed hyperlipidemia: Secondary | ICD-10-CM

## 2014-03-27 MED ORDER — VERAPAMIL HCL 40 MG PO TABS: 40.0000 mg | ORAL_TABLET | Freq: Three times a day (TID) | ORAL | Status: DC

## 2014-03-27 NOTE — Assessment & Plan Note (Addendum)
Trial of low dose CCB Referral to Dr Catalina Gravel

## 2014-03-27 NOTE — Progress Notes (Signed)
Subjective:    Patient ID: Robert [name], male    DOB: 12-03-1945, 68 y.o.   MRN: [id number]  HPI  He has had migraines since at least 1965. He's been evaluated at a headache specialist clinic by Dr. Gwenlyn Saran has also been seen at [facility]. He is presently being followed at the [facility] and receiving Botox every 11 weeks  He's never headache free and the headaches can last up to 3 days. These are described as sharp and originating in the frontal areas with radiation to the crown.  He does have a prodrome of blurred vision and fatigue. He denies any aura. He does have sensitivity to light and sound with headaches.  Tramadol is of marginal benefit after several doses.  Tryptans have been associated with shortness breath and scalp sensitivity.  He is unsure whether his ever took Topamax. Gabapentin was of no benefit.  He denies intake of caffeine on nonsteroidals.  Labs from [facility] reviewed    Review of Systems  He denies diplopia or vision loss  He has no excessive tearing of his eyes.  He has no tinnitus or hearing loss  There is no associated vertiginous symptoms  No extremity weakness, numbness, tingling it is associated  He has no signs of rhinosinusitis such as purulent secretions, facial pain, fever, chills, or sweats.        Objective:   Physical Exam Gen.: Healthy and well-nourished in appearance. Alert, appropriate and cooperative throughout exam. Appears younger than stated age  Head: Normocephalic without obvious abnormalities Eyes: No corneal or conjunctival inflammation noted. Pupils equal round reactive to light and accommodation. Extraocular motion intact. Unsustained vertical nystagmus Ears: External  ear exam reveals no significant lesions or deformities. Canals clear .TMs normal. Hearing is grossly normal bilaterally. Nose: External nasal exam reveals no deformity or inflammation. Nasal mucosa are pink and moist. No lesions or exudates noted.   Mouth: Oral  mucosa and oropharynx reveal no lesions or exudates. Teeth in good repair; upper partial.. Neck: No deformities, masses, or tenderness noted. Range of motion decreased. Thyroid normal. Lungs: Normal respiratory effort; chest expands symmetrically. Lungs are clear to auscultation without rales, wheezes, or increased work of breathing. Heart: Normal rate and rhythm. Normal S1 and S2. No gallop, click, or rub. No murmur. Abdomen: Bowel sounds normal; abdomen soft and nontender. No masses, organomegaly or hernias noted.                                 Musculoskeletal/extremities: No deformity or scoliosis noted of  the thoracic or lumbar spine. No clubbing, cyanosis, edema, or significant extremity  deformity noted. Range of motion normal .Tone & strength normal. Hand joints normal  Fingernail / toenail health good. Able to lie down & sit up w/o help. Negative SLR bilaterally Vascular: Carotid, radial artery, dorsalis pedis and  posterior tibial pulses are full and equal. No bruits present. Neurologic: Alert and oriented x3. Deep tendon reflexes symmetrical . Deep tendon reflexes are reduced at the knees. Gait normal  including heel & toe walking . Rhomberg & finger to nose   normal     Skin:   He has small keratotic lesions extensively over the upper and lower extremities. Lymph: No cervical, axillary, or inguinal lymphadenopathy present. Psych: Mood and affect are normal. Normally interactive  Assessment & Plan:  See Current Assessment & Plan in Problem List under specific Diagnosis

## 2014-03-27 NOTE — Patient Instructions (Addendum)
The [facility]  referral will be scheduled and you'll be notified of the time. Please keep a diary of your headaches . Document  each occurrence on the calendar with notation of : #1 any prodrome ( any non headache symptom such as marked fatigue,visual changes, ,etc ) which precedes actual headache ; #2) severity on 1-10 scale; #3) any triggers ( food/ drink,enviromenntal or weather changes ,physical or emotional stress) in 8-12 hour period prior to the headache; & #4) response to any medications or other intervention. Please review "Headache" @ [facility] for additional information.

## 2014-03-27 NOTE — Progress Notes (Signed)
Pre visit review using our clinic review tool, if applicable. No additional management support is needed unless otherwise documented below in the visit note. 

## 2014-03-28 NOTE — Assessment & Plan Note (Signed)
Current Lipids @ goal; no change indicated

## 2014-04-02 ENCOUNTER — Telehealth: Payer: Self-pay

## 2014-04-02 ENCOUNTER — Other Ambulatory Visit: Payer: Self-pay | Admitting: Internal Medicine

## 2014-04-02 DIAGNOSIS — M6530 Trigger finger, unspecified finger: Secondary | ICD-10-CM

## 2014-04-02 NOTE — Telephone Encounter (Signed)
Phone call from [facility] with [facility] stating patient has an upcoming trigger finger surgery on 04/08/14. Patient's insurance [employer] is requiring a referral for this

## 2014-04-08 DIAGNOSIS — Z955 Presence of coronary angioplasty implant and graft: Secondary | ICD-10-CM | POA: Insufficient documentation

## 2014-04-16 ENCOUNTER — Ambulatory Visit: Payer: Medicare HMO | Admitting: Family Medicine

## 2014-06-23 DIAGNOSIS — M659 Synovitis and tenosynovitis, unspecified: Secondary | ICD-10-CM | POA: Insufficient documentation

## 2014-07-05 ENCOUNTER — Encounter: Payer: Self-pay | Admitting: Family Medicine

## 2014-07-05 ENCOUNTER — Ambulatory Visit (HOSPITAL_COMMUNITY)
Admission: RE | Admit: 2014-07-05 | Discharge: 2014-07-05 | Disposition: A | Discharge status: Home / Self Care | Payer: Medicare HMO | Source: Ambulatory Visit | Attending: Family Medicine | Admitting: Family Medicine

## 2014-07-05 ENCOUNTER — Ambulatory Visit (INDEPENDENT_AMBULATORY_CARE_PROVIDER_SITE_OTHER): Payer: Commercial Managed Care - HMO | Admitting: Family Medicine

## 2014-07-05 ENCOUNTER — Encounter (HOSPITAL_COMMUNITY): Payer: Self-pay

## 2014-07-05 VITALS — BP 120/76 | HR 62 | Temp 98.0°F | Wt 215.0 lb

## 2014-07-05 DIAGNOSIS — R42 Dizziness and giddiness: Secondary | ICD-10-CM | POA: Diagnosis present

## 2014-07-05 DIAGNOSIS — R51 Headache: Secondary | ICD-10-CM | POA: Insufficient documentation

## 2014-07-05 LAB — COMPREHENSIVE METABOLIC PANEL
ALT: 29 U/L (ref 0–53)
AST: 25 U/L (ref 0–37)
Albumin: 4 g/dL (ref 3.5–5.2)
Alkaline Phosphatase: 49 U/L (ref 39–117)
BUN: 11 mg/dL (ref 6–23)
CALCIUM: 9.4 mg/dL (ref 8.4–10.5)
CHLORIDE: 101 meq/L (ref 96–112)
CO2: 30 mEq/L (ref 19–32)
CREATININE: 1 mg/dL (ref 0.4–1.5)
GFR: 83.16 mL/min (ref >60.00)
Glucose, Bld: 92 mg/dL (ref 70–99)
Potassium: 4.3 mEq/L (ref 3.5–5.1)
Sodium: 137 mEq/L (ref 135–145)
Total Bilirubin: 0.6 mg/dL (ref 0.2–1.2)
Total Protein: 6.8 g/dL (ref 6.0–8.3)

## 2014-07-05 LAB — CBC
HCT: 42.5 % (ref 39.0–52.0)
Hemoglobin: 14.2 g/dL (ref 13.0–17.0)
MCHC: 33.5 g/dL (ref 30.0–36.0)
MCV: 94.9 fl (ref 78.0–100.0)
PLATELETS: 177 10*3/uL (ref 150.0–400.0)
RBC: 4.48 Mil/uL (ref 4.22–5.81)
RDW: 13.7 % (ref 11.5–15.5)
WBC: 7.1 10*3/uL (ref 4.0–10.5)

## 2014-07-05 LAB — TSH: TSH: 1.7 u[IU]/mL (ref 0.35–4.50)

## 2014-07-05 MED ORDER — MECLIZINE HCL 12.5 MG PO TABS: 12.5000 mg | ORAL_TABLET | Freq: Three times a day (TID) | ORAL | Status: DC | PRN

## 2014-07-05 NOTE — Progress Notes (Signed)
Pre visit review using our clinic review tool, if applicable. No additional management support is needed unless otherwise documented below in the visit note. 

## 2014-07-05 NOTE — Assessment & Plan Note (Addendum)
New in a pt new to me. >25 minutes spent in face to face time with patient and his daughter, >50% spent in counselling or coordination of care. Likely vertigo perhaps caused by atypical migraine (not consistent with his typical migrainous symptoms), however I cannot exclude more serious causes of vertigo.  Suggested either going to ER or having stat labs and head CT done at [facility].  They would prefer to work up as outpatient but are aware they need to go to ER if symptoms progress. Orders Placed This Encounter  Procedures  . CT Head Wo Contrast  . CBC  . Comp Met (CMET)  . TSH

## 2014-07-05 NOTE — Patient Instructions (Signed)
Nice to meet you. Please go downstairs to get your labs drawn and then to [facility] radiology.  I will call you with your results.  Drink plenty of fluids, rest.  Ok to take over the counter antivert for dizziness.

## 2014-07-05 NOTE — Progress Notes (Signed)
Subjective:   Patient ID: Robert [name], male    DOB: [birth year], 68 y.o.   MRN: [id number]  Robert [name] is a pleasant 68 y.o. year old male new to me who presents to weekend clinic today with Dizziness and Headache  on 07/05/2014  HPI:  H/o CAD- s/p NSTEMI with DES in 2012- followed by Dr. Martinique. Last saw Dr. Martinique on 02/13/14- note reviewed.  Neg myoview.  Woke up yesterday morning, felt something was in his left eye.  Went to play golf and felt ok.  By the end of the day started to develop HA on left side behind his eye.  Does have a h/o migraine but this feels different.  Feels unsteady, swimmy headed. No nausea or chest pain.  Dizziness is worsened by changes of head position but something feels "strange" on left side of his head.  No blurred vision. Eye pain has resolved.  No dysarthria, slurred speech, facial droop, or UE/LE weakness.  Current Outpatient Prescriptions on File Prior to Visit  Medication Sig Dispense Refill  . aspirin EC 81 MG tablet Take 81 mg by mouth daily.      . Cholecalciferol (VITAMIN D) 2000 UNITS CAPS Take 1 capsule by mouth daily.      Marland Kitchen ezetimibe (ZETIA) 10 MG tablet Take 5 mg by mouth daily.      . fish oil-omega-3 fatty acids 1000 MG capsule Take 1 g by mouth daily.      Marland Kitchen levocetirizine (XYZAL) 5 MG tablet Take 5 mg by mouth every evening.       . metoprolol tartrate (LOPRESSOR) 25 MG tablet Take 12.5 mg by mouth daily.      . mometasone (NASONEX) 50 MCG/ACT nasal spray Place 2 sprays into the nose daily.      . Multiple Vitamins-Minerals (MULTIVITAMINS THER. W/MINERALS) TABS Take 1 tablet by mouth daily.      . nitroGLYCERIN (NITROLINGUAL) 0.4 MG/SPRAY spray Place 1 spray under the tongue every 5 (five) minutes x 3 doses as needed for chest pain.  12 g  12  . pantoprazole (PROTONIX) 40 MG tablet Take 1 tablet (40 mg total) by mouth daily.  90 tablet  3  . rosuvastatin (CRESTOR) 40 MG tablet Take 0.5 tablets (20 mg total) by mouth daily.  90  tablet  3  . traMADol (ULTRAM) 50 MG tablet Take 50 mg by mouth every 6 (six) hours as needed. For pain      . verapamil (CALAN) 40 MG tablet Take 1 tablet (40 mg total) by mouth 3 (three) times daily.  45 tablet  0  . vitamin C (ASCORBIC ACID) 500 MG tablet Take 500 mg by mouth daily.       No current facility-administered medications on file prior to visit.    Allergies  Allergen Reactions  . Penicillins     itching  . Sumatriptan     REACTION: sob, headache scalp sensitivity  . Oxycodone Itching    Past Medical History  Diagnosis Date  . GERD (gastroesophageal reflux disease)   . CAD (coronary artery disease) 10/10/2011    NSTEMI with DES to the proximal RCA following flow wire evaluation. EF is normal.   . Hyperlipidemia   . Hypertension   . 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 Myocardial infarction 10/2011  . OSA on CPAP     "wear it most of the time" (01/14/2014)  . History of stomach ulcers   .  H/O esophageal ulcer   . Migraines     "quite often; maybe q 10d to 2 wk" (01/14/2014)    Past Surgical History  Procedure Laterality Date  . Back surgery    . Shoulder arthroscopy w/ rotator cuff repair Left 1990's  . Trigger finger release Left     3rd and 4th digits  . Coronary angioplasty with stent placement  10/13/2011    DES to the proximal RCA. Dr Martinique  . Inguinal hernia repair  10/23/2012    Procedure: HERNIA REPAIR INGUINAL ADULT;  Surgeon: Odis Hollingshead, MD;  Location: [place];  Service: General;  Laterality: Left;  Left lower quadrant; left inguinal hernia repair with mesh  . Insertion of mesh  10/23/2012    Procedure: INSERTION OF MESH;  Surgeon: Odis Hollingshead, MD;  Location: [place];  Service: General;  Laterality: Left;  Left lower quadrant  . Hernia repair    . Cervical disc surgery  1980's?    Family History  Problem Relation Age of Onset  . Cancer Mother     lung  . Heart disease Father     heart attack   . Cancer Father      colon    History   Social History  . Marital Status: Married    Spouse Name: N/A    Number of Children: N/A  . Years of Education: N/A   Occupational History  . Not on file.   Social History Main Topics  . Smoking status: Former Smoker -- 2.00 packs/day for 20 years    Types: Cigarettes    Quit date: 11/14/1982  . Smokeless tobacco: Never Used  . Alcohol Use: 3.6 oz/week    6 Cans of beer per week  . Drug Use: No  . Sexual Activity: Not Currently   Other Topics Concern  . Not on file   Social History Narrative  . No narrative on file   The PMH, PSH, Social History, Family History, Medications, and allergies have been reviewed in [facility], and have been updated if relevant.  Review of Systems See HPI    No difficulty with gait No nausea or vomiting No photophobia No visual changes Objective:    BP 120/76  Pulse 62  Temp(Src) 98 F (36.7 C) (Oral)  Wt 215 lb (97.523 kg)  SpO2 98%   Physical Exam  Nursing note and vitals reviewed. Constitutional: He is oriented to person, place, and time. He appears well-developed and well-nourished. No distress.  HENT:  Head: Normocephalic.  Right Ear: Tympanic membrane and ear canal normal.  Left Ear: Tympanic membrane and ear canal normal.  + dix Hallpike, neg nyastagmus  Eyes: Pupils are equal, round, and reactive to light.  Neck: Normal range of motion. Neck supple.  Cardiovascular: Normal rate and regular rhythm.   Pulmonary/Chest: Effort normal and breath sounds normal.  Abdominal: Soft.  Neurological: He is alert and oriented to person, place, and time. He has normal reflexes. No cranial nerve deficit. Coordination normal.  Decreased grip strength right- per pt, this is his baseline since his recent hand surgery.  No facial droop.  Normal gait.  Psychiatric: He has a normal mood and affect. His behavior is normal. Judgment and thought content normal.          Assessment & Plan:   Dizziness and giddiness -  Plan: CBC, Comp Met (CMET), TSH, CT Head Wo Contrast, CANCELED: CT Head W Contrast No Follow-up on file.

## 2014-07-05 NOTE — Addendum Note (Signed)
Addended by: Lucille Passy on: 07/05/2014 12:43 PM   Modules accepted: Orders

## 2014-07-07 ENCOUNTER — Telehealth: Payer: Self-pay

## 2014-07-07 ENCOUNTER — Other Ambulatory Visit (INDEPENDENT_AMBULATORY_CARE_PROVIDER_SITE_OTHER): Payer: Commercial Managed Care - HMO

## 2014-07-07 DIAGNOSIS — R42 Dizziness and giddiness: Secondary | ICD-10-CM

## 2014-07-07 NOTE — Telephone Encounter (Signed)
[facility] (After Hours Triage) Fax: [phone] From: [facility] Date/ Time: 07/05/2014 2:38 PM Taken By: Luella Cook, CSR Caller: Snyder: Robert [name] Patient: Robert [name], Robert [name] DOB: 1946/09/21 Phone: [phone] Reason for Call: Amy is calling with results for Other ordered by Dr Deborra Medina. The results are no acute abnormality seen and were read by Dr Sabino Dick. Regarding Appointment:

## 2014-10-22 ENCOUNTER — Encounter (HOSPITAL_COMMUNITY): Payer: Self-pay | Admitting: Cardiology

## 2014-10-28 ENCOUNTER — Other Ambulatory Visit: Payer: Self-pay | Admitting: Internal Medicine

## 2014-10-28 ENCOUNTER — Telehealth: Payer: Self-pay | Admitting: Internal Medicine

## 2014-10-28 DIAGNOSIS — M6530 Trigger finger, unspecified finger: Secondary | ICD-10-CM | POA: Insufficient documentation

## 2014-10-28 NOTE — Telephone Encounter (Signed)
Patient is requesting referral for trigger finger for [facility]

## 2015-01-15 ENCOUNTER — Ambulatory Visit (INDEPENDENT_AMBULATORY_CARE_PROVIDER_SITE_OTHER): Payer: Commercial Managed Care - HMO | Admitting: Cardiology

## 2015-01-15 ENCOUNTER — Encounter: Payer: Self-pay | Admitting: Cardiology

## 2015-01-15 ENCOUNTER — Encounter: Payer: Self-pay | Admitting: Internal Medicine

## 2015-01-15 ENCOUNTER — Ambulatory Visit (INDEPENDENT_AMBULATORY_CARE_PROVIDER_SITE_OTHER): Payer: Commercial Managed Care - HMO | Admitting: Internal Medicine

## 2015-01-15 VITALS — BP 122/66 | HR 65 | Ht 73.0 in | Wt 220.1 lb

## 2015-01-15 VITALS — BP 120/68 | HR 71 | Temp 97.5°F | Resp 16 | Ht 73.0 in | Wt 220.0 lb

## 2015-01-15 DIAGNOSIS — E782 Mixed hyperlipidemia: Secondary | ICD-10-CM | POA: Diagnosis not present

## 2015-01-15 DIAGNOSIS — J0110 Acute frontal sinusitis, unspecified: Secondary | ICD-10-CM

## 2015-01-15 DIAGNOSIS — I2510 Atherosclerotic heart disease of native coronary artery without angina pectoris: Secondary | ICD-10-CM | POA: Diagnosis not present

## 2015-01-15 DIAGNOSIS — I10 Essential (primary) hypertension: Secondary | ICD-10-CM

## 2015-01-15 MED ORDER — SULFAMETHOXAZOLE-TRIMETHOPRIM 800-160 MG PO TABS: 1.0000 | ORAL_TABLET | Freq: Two times a day (BID) | ORAL | Status: DC

## 2015-01-15 NOTE — Progress Notes (Signed)
Pre visit review using our clinic review tool, if applicable. No additional management support is needed unless otherwise documented below in the visit note. 

## 2015-01-15 NOTE — Patient Instructions (Signed)

## 2015-01-15 NOTE — Patient Instructions (Signed)
Continue your current therapy  I will see you in one year   

## 2015-01-15 NOTE — Progress Notes (Signed)
   Subjective:    Patient ID: Robert [name], male    DOB: Jun 12, 1946, 69 y.o.   MRN: [id number]  HPI His  symptoms began  01/07/15 as a sore throat. This was associated with some chest tightness. The latter has essentially resolved although he continues to have a nonproductive cough as of 3/1. Major issues are upper respiratory tract with green nasal discharge, postnasal drainage, hoarseness, frontal sinus and maillary sinus pain.  Review of Systems He denies fever, chills, or sweats.  There is no associated shortness of breath or wheezing.  He also has no otic pain or otic discharge.    Objective:   Physical Exam  General appearance:Adequately nourished; no acute distress or increased work of breathing is present.  No  lymphadenopathy about the head, neck, or axilla noted.  Eyes: No conjunctival inflammation or lid edema is present. There is no scleral icterus.EOMI Ears:  External ear exam shows no significant lesions or deformities.  Otoscopic examination reveals clear canals, tympanic membranes are intact bilaterally without bulging, retraction, inflammation or discharge. Nose:  External nasal examination shows no deformity or inflammation. Nasal mucosa are erythematous without lesions or exudates. No septal dislocation or deviation.No obstruction to airflow.  Oral exam: Dental hygiene is good; lips and gums are healthy appearing.There is no oropharyngeal erythema or exudate noted.  Neck:  No deformities, thyromegaly, masses, or tenderness noted.   He has decreased range of motion cervical spine.  Heart:  Normal rate and regular rhythm. S1 and S2 normal without gallop, murmur, click, rub or other extra sounds.  Lungs:Breath sounds are decreased.Chest clear to auscultation; no wheezes, rhonchi,rales ,or rubs present. Extremities:  No cyanosis, edema, or clubbing  noted  Skin: Warm & dry w/o jaundice or tenting.      Assessment & Plan:  #1 rhinosinusitis without  bronchitis  Plan:  Nasal hygiene interventions discussed. See prescription medications

## 2015-01-15 NOTE — Progress Notes (Signed)
Robert [name] Date of Birth: June 07, 1946 Medical Record #[id number]  History of Present Illness: Mr. Suarez is seen today for follow up CAD.  He has had a  NSTEMI with DES to the proximal RCA in November 2012. EF was normal.  Stress Myoview in March 2015 was Normal. He has no recurrent chest pain. He remains  active. He denies any chest pain, SOB, or palpitations. No other new medical problems.  Current Outpatient Prescriptions on File Prior to Visit  Medication Sig Dispense Refill  . aspirin EC 81 MG tablet Take 81 mg by mouth daily.    . Cholecalciferol (VITAMIN D) 2000 UNITS CAPS Take 1 capsule by mouth daily.    Marland Kitchen ezetimibe (ZETIA) 10 MG tablet Take 5 mg by mouth daily.    . fish oil-omega-3 fatty acids 1000 MG capsule Take 1 g by mouth daily.    Marland Kitchen levocetirizine (XYZAL) 5 MG tablet Take 5 mg by mouth every evening.     . meclizine (ANTIVERT) 12.5 MG tablet Take 1 tablet (12.5 mg total) by mouth 3 (three) times daily as needed for dizziness. 30 tablet 0  . metoprolol tartrate (LOPRESSOR) 25 MG tablet Take 12.5 mg by mouth daily.    . mometasone (NASONEX) 50 MCG/ACT nasal spray Place 2 sprays into the nose daily.    . Multiple Vitamins-Minerals (MULTIVITAMINS THER. W/MINERALS) TABS Take 1 tablet by mouth daily.    . nitroGLYCERIN (NITROLINGUAL) 0.4 MG/SPRAY spray Place 1 spray under the tongue every 5 (five) minutes x 3 doses as needed for chest pain. 12 g 12  . pantoprazole (PROTONIX) 40 MG tablet Take 1 tablet (40 mg total) by mouth daily. 90 tablet 3  . rosuvastatin (CRESTOR) 40 MG tablet Take 0.5 tablets (20 mg total) by mouth daily. 90 tablet 3  . traMADol (ULTRAM) 50 MG tablet Take 50 mg by mouth every 6 (six) hours as needed. For pain    . verapamil (CALAN) 40 MG tablet Take 1 tablet (40 mg total) by mouth 3 (three) times daily. 45 tablet 0  . vitamin C (ASCORBIC ACID) 500 MG tablet Take 500 mg by mouth daily.     No current facility-administered medications on file prior to visit.     Allergies  Allergen Reactions  . Penicillins     itching  . Sumatriptan     REACTION: sob, headache scalp sensitivity  . Oxycodone Itching    Past Medical History  Diagnosis Date  . GERD (gastroesophageal reflux disease)   . CAD (coronary artery disease) 10/10/2011    NSTEMI with DES to the proximal RCA following flow wire evaluation. EF is normal.   . Hyperlipidemia   . Hypertension   . 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 Myocardial infarction 10/2011  . OSA on CPAP     "wear it most of the time" (01/14/2014)  . History of stomach ulcers   . H/O esophageal ulcer   . Migraines     "quite often; maybe q 10d to 2 wk" (01/14/2014)    Past Surgical History  Procedure Laterality Date  . Back surgery    . Shoulder arthroscopy w/ rotator cuff repair Left 1990's  . Trigger finger release Left     3rd and 4th digits  . Coronary angioplasty with stent placement  10/13/2011    DES to the proximal RCA. Dr Martinique  . Inguinal hernia repair  10/23/2012    Procedure: HERNIA REPAIR INGUINAL  ADULT;  Surgeon: Odis Hollingshead, MD;  Location: [place];  Service: General;  Laterality: Left;  Left lower quadrant; left inguinal hernia repair with mesh  . Insertion of mesh  10/23/2012    Procedure: INSERTION OF MESH;  Surgeon: Odis Hollingshead, MD;  Location: [place];  Service: General;  Laterality: Left;  Left lower quadrant  . Hernia repair    . Cervical disc surgery  1980's?  . Left heart catheterization with coronary angiogram N/A 10/13/2011    Procedure: LEFT HEART CATHETERIZATION WITH CORONARY ANGIOGRAM;  Surgeon: Takeesha Isley M Martinique, MD;  Location: [facility] CATH LAB;  Service: Cardiovascular;  Laterality: N/A;  . Fractional flow reserve wire Right 10/13/2011    Procedure: FRACTIONAL FLOW RESERVE WIRE;  Surgeon: Kirstyn Lean M Martinique, MD;  Location: [facility] CATH LAB;  Service: Cardiovascular;  Laterality: Right;  . Percutaneous coronary stent intervention (pci-s) Right 10/13/2011    Procedure:  PERCUTANEOUS CORONARY STENT INTERVENTION (PCI-S);  Surgeon: Prue Lingenfelter M Martinique, MD;  Location: [facility] CATH LAB;  Service: Cardiovascular;  Laterality: Right;    History  Smoking status  . Former Smoker -- 2.00 packs/day for 20 years  . Types: Cigarettes  . Quit date: 11/14/1982  Smokeless tobacco  . Never Used    History  Alcohol Use  . 3.6 oz/week  . 6 Cans of beer per week    Family History  Problem Relation Age of Onset  . Cancer Mother     lung  . Heart disease Father     heart attack   . Cancer Father     colon    Review of Systems: The review of systems is per the HPI.  All other systems were reviewed and are negative.  Physical Exam: BP 122/66 mmHg  Pulse 65  Ht 6\' 1"  (1.854 m)  Wt 220 lb 1.6 oz (99.837 kg)  BMI 29.05 kg/m2 Patient is  pleasant and in no acute distress. Skin is warm and dry. Color is normal.  HEENT is unremarkable. Normocephalic/atraumatic. PERRL. Sclera are nonicteric. Neck is supple. No masses. No JVD. Lungs are clear. Cardiac exam shows a regular rate and rhythm.normal S1 and S2 without gallop or murmur. Abdomen is soft. No masses or bruits.Extremities are without edema.pedal pulses are good. Gait and ROM are intact. No gross neurologic deficits noted.   LABORATORY DATA: Ecg: today- NSR rate 65. LAE. I have personally reviewed and interpreted this study.  Labs reviewed from the [facility] Dec 2015- chol-160, trig-147, hdl-63, ldl- 68. All other labs normal-scanned into chart.  Assessment / Plan: 1. Coronary artery disease status post drug-eluting stent to the proximal RCA in November 2012. Myoview March 2015 was normal. Asymptomatic. Continue ASA, statin, metoprolol. Follow up in one year.  2. Hyperlipidemia. Continue crestor and Zetia- well controlled.  3. Hypertension well controlled.

## 2015-01-19 ENCOUNTER — Encounter: Payer: Self-pay | Admitting: Cardiology

## 2015-04-15 ENCOUNTER — Telehealth: Payer: Self-pay

## 2015-04-15 NOTE — Telephone Encounter (Signed)
Patient called to educate on Medicare Wellness apt. LVM for the patient to call back to educate and schedule for wellness visit.   

## 2015-05-06 ENCOUNTER — Encounter: Payer: Self-pay | Admitting: Internal Medicine

## 2015-05-06 ENCOUNTER — Other Ambulatory Visit (INDEPENDENT_AMBULATORY_CARE_PROVIDER_SITE_OTHER): Payer: Commercial Managed Care - HMO

## 2015-05-06 ENCOUNTER — Ambulatory Visit (INDEPENDENT_AMBULATORY_CARE_PROVIDER_SITE_OTHER): Payer: Commercial Managed Care - HMO | Admitting: Internal Medicine

## 2015-05-06 VITALS — BP 122/64 | HR 62 | Temp 98.0°F | Resp 16 | Wt 217.0 lb

## 2015-05-06 DIAGNOSIS — R519 Headache, unspecified: Secondary | ICD-10-CM

## 2015-05-06 DIAGNOSIS — R51 Headache: Secondary | ICD-10-CM | POA: Diagnosis not present

## 2015-05-06 DIAGNOSIS — R58 Hemorrhage, not elsewhere classified: Secondary | ICD-10-CM

## 2015-05-06 DIAGNOSIS — G8929 Other chronic pain: Secondary | ICD-10-CM

## 2015-05-06 LAB — CBC WITH DIFFERENTIAL/PLATELET
BASOS PCT: 0.2 % (ref 0.0–3.0)
Basophils Absolute: 0 10*3/uL (ref 0.0–0.1)
EOS ABS: 0.1 10*3/uL (ref 0.0–0.7)
EOS PCT: 1.7 % (ref 0.0–5.0)
HCT: 42.2 % (ref 39.0–52.0)
Hemoglobin: 14.3 g/dL (ref 13.0–17.0)
LYMPHS PCT: 32.2 % (ref 12.0–46.0)
Lymphs Abs: 2 10*3/uL (ref 0.7–4.0)
MCHC: 33.8 g/dL (ref 30.0–36.0)
MCV: 94.2 fl (ref 78.0–100.0)
MONOS PCT: 8.4 % (ref 3.0–12.0)
Monocytes Absolute: 0.5 10*3/uL (ref 0.1–1.0)
NEUTROS PCT: 57.5 % (ref 43.0–77.0)
Neutro Abs: 3.6 10*3/uL (ref 1.4–7.7)
Platelets: 201 10*3/uL (ref 150.0–400.0)
RBC: 4.49 Mil/uL (ref 4.22–5.81)
RDW: 14 % (ref 11.5–15.5)
WBC: 6.3 10*3/uL (ref 4.0–10.5)

## 2015-05-06 NOTE — Assessment & Plan Note (Signed)
Copy of OV to Dr Marshell Levan

## 2015-05-06 NOTE — Progress Notes (Signed)
   Subjective:    Patient ID: Robert [name], male    DOB: 17-Sep-1946, 69 y.o.   MRN: [id number]  HPI   In the last week he's had 3 episodes of bright red blood on the pillowcase manifested as four-5 different areas of blood collections ranging in size from dime to quarter sized. He denies any trauma to his tongue or oral tissues. He does use CPAP.  He denies any other bleeding dyscrasias.  He is on 81 mg ASA daily; not on warfarin or other anticoagulants.  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 Marshell Levan. He receives Botox injections every 11 weeks. The most recent episode was 05/04/15 described as sharp up to level VIII associated with nausea, photophobia, phonophobia. He took tramadol 2 tablets every 4 hours with pain relief.  He has had some vertigo in the last 10 months. CT scan performed in August 2015 revealed only atrophic changes. He has some unsteadiness today but no frank vertigo.  Review of Systems Epistaxis, hemoptysis, hematuria, melena, or rectal bleeding denied. No unexplained weight loss, significant dyspepsia,dysphagia, or abdominal pain.  There is no abnormal bruising , bleeding, or difficulty stopping bleeding with injury. Mental status change or memory loss denied. Blurred vision , diplopia or vision loss absent. There is no numbness, tingling, or weakness in extremities.   No loss of control of bladder or bowels. Radicular type pain absent. No seizure stigmata.     Objective:   Physical Exam General appearance: Pattern alopecia present. Adequately nourished; no acute distress or increased work of breathing is present.    Lymphatic: No  lymphadenopathy about the head, neck, or axilla .  Eyes: No conjunctival inflammation or lid edema is present. There is no scleral icterus.  Ears:  External ear exam shows no significant lesions or deformities.  Otoscopic examination reveals clear canals, tympanic membranes are intact  bilaterally without bulging, retraction, inflammation or discharge. Air conduction greater than bone conduction. Tuning fork lateralizes to the left. Hearing is decreased  Nose:  External nasal examination shows no deformity or inflammation. Nasal mucosa are pink and moist without lesions or exudates No septal dislocation or deviation.No obstruction to airflow.   Oral exam: Dental hygiene is good; lips and gums are healthy appearing.There is no oropharyngeal erythema or exudate .  Neck:  No deformities, thyromegaly, masses, or tenderness noted.   Supple with full range of motion without pain.   Heart:  Normal rate and regular rhythm. S1 and S2 normal without gallop, murmur, click, rub or other extra sounds.   Lungs:Chest clear to auscultation; no wheezes, rhonchi,rales ,or rubs present.  Extremities:  No cyanosis, edema, or clubbing  noted    Skin: Warm & dry w/o tenting or jaundice. Deeply tanned forearms with myriad solar keratoses.  Neuro: Rhomberg and gait testing reveals slight unsteadiness.         Assessment & Plan:  #1 bleeding of unknown etiology; no active bleeding localized    #2 chronic headache disorder being actively managed at the [place]  #3 solar keratoses; follow-up with the Dermatologist recommended  Plan: See orders and recommendations

## 2015-05-06 NOTE — Progress Notes (Signed)
Pre visit review using our clinic review tool, if applicable. No additional management support is needed unless otherwise documented below in the visit note. 

## 2015-05-06 NOTE — Patient Instructions (Signed)
  Your next office appointment will be determined based upon review of your pending labs. Those written interpretation of the lab results and instructions will be transmitted to you by mail for your records.  Critical results will be called.   Followup as needed for any active or acute issue. Please report any significant change in your symptoms. 

## 2015-06-08 ENCOUNTER — Other Ambulatory Visit: Payer: Self-pay | Admitting: Internal Medicine

## 2015-06-08 ENCOUNTER — Telehealth: Payer: Self-pay | Admitting: Internal Medicine

## 2015-06-08 DIAGNOSIS — J3081 Allergic rhinitis due to animal (cat) (dog) hair and dander: Secondary | ICD-10-CM | POA: Diagnosis not present

## 2015-06-08 DIAGNOSIS — J301 Allergic rhinitis due to pollen: Secondary | ICD-10-CM | POA: Diagnosis not present

## 2015-06-08 DIAGNOSIS — J309 Allergic rhinitis, unspecified: Secondary | ICD-10-CM

## 2015-06-08 DIAGNOSIS — H1045 Other chronic allergic conjunctivitis: Secondary | ICD-10-CM | POA: Diagnosis not present

## 2015-06-08 DIAGNOSIS — J019 Acute sinusitis, unspecified: Secondary | ICD-10-CM | POA: Diagnosis not present

## 2015-06-08 DIAGNOSIS — J3089 Other allergic rhinitis: Secondary | ICD-10-CM | POA: Diagnosis not present

## 2015-06-08 NOTE — Telephone Encounter (Signed)
ordered

## 2015-06-08 NOTE — Telephone Encounter (Signed)
Patient need a referral to Dr Donneta Romberg Office to get a depo shot, please advise

## 2015-06-08 NOTE — Telephone Encounter (Signed)
Please advise 

## 2015-06-08 NOTE — Telephone Encounter (Signed)
Pt informed that order has been entered.

## 2015-08-16 ENCOUNTER — Encounter: Payer: Self-pay | Admitting: Internal Medicine

## 2015-08-16 DIAGNOSIS — G9389 Other specified disorders of brain: Secondary | ICD-10-CM | POA: Insufficient documentation

## 2015-08-24 ENCOUNTER — Telehealth: Payer: Self-pay | Admitting: Internal Medicine

## 2015-08-24 ENCOUNTER — Other Ambulatory Visit: Payer: Self-pay | Admitting: Internal Medicine

## 2015-08-24 DIAGNOSIS — G9389 Other specified disorders of brain: Secondary | ICD-10-CM

## 2015-08-24 NOTE — Telephone Encounter (Signed)
Please advise 

## 2015-08-24 NOTE — Telephone Encounter (Signed)
Done

## 2015-08-24 NOTE — Telephone Encounter (Signed)
States dropped MRI off to Dr. Linna Darner two weeks ago.  Was wanting a referral to neurology based off those results.  Would like a call back in regards.

## 2015-08-24 NOTE — Telephone Encounter (Signed)
Spoke with pt to inform.  

## 2015-09-22 ENCOUNTER — Encounter: Payer: Self-pay | Admitting: Neurology

## 2015-09-22 ENCOUNTER — Ambulatory Visit (INDEPENDENT_AMBULATORY_CARE_PROVIDER_SITE_OTHER): Payer: Commercial Managed Care - HMO | Admitting: Neurology

## 2015-09-22 VITALS — BP 122/64 | HR 69 | Ht 73.0 in | Wt 217.0 lb

## 2015-09-22 DIAGNOSIS — E782 Mixed hyperlipidemia: Secondary | ICD-10-CM | POA: Diagnosis not present

## 2015-09-22 DIAGNOSIS — I679 Cerebrovascular disease, unspecified: Secondary | ICD-10-CM | POA: Diagnosis not present

## 2015-09-22 DIAGNOSIS — I10 Essential (primary) hypertension: Secondary | ICD-10-CM | POA: Diagnosis not present

## 2015-09-22 DIAGNOSIS — G9389 Other specified disorders of brain: Secondary | ICD-10-CM

## 2015-09-22 DIAGNOSIS — I2510 Atherosclerotic heart disease of native coronary artery without angina pectoris: Secondary | ICD-10-CM

## 2015-09-22 NOTE — Progress Notes (Signed)
Chart forwarded.  

## 2015-09-22 NOTE — Patient Instructions (Signed)
The MRI shows an old stroke.  It was even seen on a CT of the head from back in 2012 Management would be focusing on stroke prevention 1.  Continue aspirin 81mg  daily (only once a day) 2.  Continue cholesterol medication (LDL goal should be less than 70) 3.  Continue blood pressure control 4.  Routine exercise 5.  Mediterranean diet    Why follow it? Research shows. . Those who follow the Mediterranean diet have a reduced risk of heart disease  . The diet is associated with a reduced incidence of Parkinson's and Alzheimer's diseases . People following the diet may have longer life expectancies and lower rates of chronic diseases  . The Dietary Guidelines for Americans recommends the Mediterranean diet as an eating plan to promote health and prevent disease  What Is the Mediterranean Diet?  . Healthy eating plan based on typical foods and recipes of Mediterranean-style cooking . The diet is primarily a plant based diet; these foods should make up a majority of meals   Starches - Plant based foods should make up a majority of meals - They are an important sources of vitamins, minerals, energy, antioxidants, and fiber - Choose whole grains, foods high in fiber and minimally processed items  - Typical grain sources include wheat, oats, barley, corn, brown rice, bulgar, farro, millet, polenta, couscous  - Various types of beans include chickpeas, lentils, fava beans, black beans, white beans   Fruits  Veggies - Large quantities of antioxidant rich fruits & veggies; 6 or more servings  - Vegetables can be eaten raw or lightly drizzled with oil and cooked  - Vegetables common to the traditional Mediterranean Diet include: artichokes, arugula, beets, broccoli, brussel sprouts, cabbage, carrots, celery, collard greens, cucumbers, eggplant, kale, leeks, lemons, lettuce, mushrooms, okra, onions, peas, peppers, potatoes, pumpkin, radishes, rutabaga, shallots, spinach, sweet potatoes, turnips, zucchini -  Fruits common to the Mediterranean Diet include: apples, apricots, avocados, cherries, clementines, dates, figs, grapefruits, grapes, melons, nectarines, oranges, peaches, pears, pomegranates, strawberries, tangerines  Fats - Replace butter and margarine with healthy oils, such as olive oil, canola oil, and tahini  - Limit nuts to no more than a handful a day  - Nuts include walnuts, almonds, pecans, pistachios, pine nuts  - Limit or avoid candied, honey roasted or heavily salted nuts - Olives are central to the [employer] - can be eaten whole or used in a variety of dishes   Meats Protein - Limiting red meat: no more than a few times a month - When eating red meat: choose lean cuts and keep the portion to the size of deck of cards - Eggs: approx. 0 to 4 times a week  - Fish and lean poultry: at least 2 a week  - Healthy protein sources include, chicken, [place], lean beef, lamb - Increase intake of seafood such as tuna, salmon, trout, mackerel, shrimp, scallops - Avoid or limit high fat processed meats such as sausage and bacon  Dairy - Include moderate amounts of low fat dairy products  - Focus on healthy dairy such as fat free yogurt, skim milk, low or reduced fat cheese - Limit dairy products higher in fat such as whole or 2% milk, cheese, ice cream  Alcohol - Moderate amounts of red wine is ok  - No more than 5 oz daily for women (all ages) and men older than age 66  - No more than 10 oz of wine daily for men younger than 38  Other - Limit sweets and other desserts  - Use herbs and spices instead of salt to flavor foods  - Herbs and spices common to the traditional Mediterranean Diet include: basil, bay leaves, chives, cloves, cumin, fennel, garlic, lavender, marjoram, mint, oregano, parsley, pepper, rosemary, sage, savory, sumac, tarragon, thyme   It's not just a diet, it's a lifestyle:  . The Mediterranean diet includes lifestyle factors typical of those in the region  . Foods,  drinks and meals are best eaten with others and savored . Daily physical activity is important for overall good health . This could be strenuous exercise like running and aerobics . This could also be more leisurely activities such as walking, housework, yard-work, or taking the stairs . Moderation is the key; a balanced and healthy diet accommodates most foods and drinks . Consider portion sizes and frequency of consumption of certain foods   Meal Ideas & Options:  . Breakfast:  o Whole wheat toast or whole wheat English muffins with peanut butter & hard boiled egg o Steel cut oats topped with apples & cinnamon and skim milk  o Fresh fruit: banana, strawberries, melon, berries, peaches  o Smoothies: strawberries, bananas, greek yogurt, peanut butter o Low fat greek yogurt with blueberries and granola  o Egg white omelet with spinach and mushrooms o Breakfast couscous: whole wheat couscous, apricots, skim milk, cranberries  . Sandwiches:  o Hummus and grilled vegetables (peppers, zucchini, squash) on whole wheat bread   o Grilled chicken on whole wheat pita with lettuce, tomatoes, cucumbers or tzatziki  o Tuna salad on whole wheat bread: tuna salad made with greek yogurt, olives, red peppers, capers, green onions o Garlic rosemary lamb pita: lamb sauted with garlic, rosemary, salt & pepper; add lettuce, cucumber, greek yogurt to pita - flavor with lemon juice and black pepper  . Seafood:  o Mediterranean grilled salmon, seasoned with garlic, basil, parsley, lemon juice and black pepper o Shrimp, lemon, and spinach whole-grain pasta salad made with low fat greek yogurt  o Seared scallops with lemon orzo  o Seared tuna steaks seasoned salt, pepper, coriander topped with tomato mixture of olives, tomatoes, olive oil, minced garlic, parsley, green onions and cappers  . Meats:  o Herbed greek chicken salad with kalamata olives, cucumber, feta  o Red bell peppers stuffed with spinach, bulgur,  lean ground beef (or lentils) & topped with feta   o Kebabs: skewers of chicken, tomatoes, onions, zucchini, squash  o [place] burgers: made with red onions, mint, dill, lemon juice, feta cheese topped with roasted red peppers . Vegetarian o Cucumber salad: cucumbers, artichoke hearts, celery, red onion, feta cheese, tossed in olive oil & lemon juice  o Hummus and whole grain pita points with a greek salad (lettuce, tomato, feta, olives, cucumbers, red onion) o Lentil soup with celery, carrots made with vegetable broth, garlic, salt and pepper  o Tabouli salad: parsley, bulgur, mint, scallions, cucumbers, tomato, radishes, lemon juice, olive oil, salt and pepper. 6.  Follow up as needed.

## 2015-09-22 NOTE — Progress Notes (Signed)
NEUROLOGY CONSULTATION NOTE  Robert [name] MRN: [id number] DOB: [birth year]  Referring provider: Dr. Linna Darner Primary care provider: Dr. 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  He is accompanied by his wife who provides some history.  Images of brain MRI and prior CT reviewed.  Labs reviewed.  He has history of recurrent vertigo.  He is treated by a neurologist for his vertigo and migraines at the [place].  On 08/07/15, he had an MRI of the brain to evaluate for vertigo.  It revealed chronic small vessel ischemic changes as well as remote left occipital infarct.  He presents today to discuss what this this.  He denies history of stroke.  However, he has had brain imaging in the past to evaluate his longstanding history of dizziness and migraines.  CT of head from 09/07/11 also revealed remote infarct in the left occipital region.  He takes ASA 81mg  daily.  He also takes Crestor 20mg  and Zetia.  Last available lipid panel from March 2015 showed cholesterol 207, TG 175, HDL 58 and LDL 114.  It is managed at the [place] and he says it is controlled.  Blood pressure is also controlled.  PAST MEDICAL HISTORY: Past Medical History  Diagnosis Date  . GERD (gastroesophageal reflux disease)   . CAD (coronary artery disease) 10/10/2011    NSTEMI with DES to the proximal RCA following flow wire evaluation. EF is normal.   . Hyperlipidemia   . Hypertension   . 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 Myocardial infarction ([place]) 10/2011  . OSA on CPAP     "wear it most of the time" (01/14/2014)  . History of stomach ulcers   . H/O esophageal ulcer   . Migraines     "quite often; maybe q 10d to 2 wk" (01/14/2014)    PAST SURGICAL HISTORY: Past Surgical History  Procedure Laterality Date  . Back  surgery    . Shoulder arthroscopy w/ rotator cuff repair Left 1990's  . Trigger finger release Left     3rd and 4th digits  . Coronary angioplasty with stent placement  10/13/2011    DES to the proximal RCA. Dr Martinique  . Inguinal hernia repair  10/23/2012    Procedure: HERNIA REPAIR INGUINAL ADULT;  Surgeon: Odis Hollingshead, MD;  Location: [place];  Service: General;  Laterality: Left;  Left lower quadrant; left inguinal hernia repair with mesh  . Insertion of mesh  10/23/2012    Procedure: INSERTION OF MESH;  Surgeon: Odis Hollingshead, MD;  Location: [facility];  Service: General;  Laterality: Left;  Left lower quadrant  . Hernia repair    . Cervical disc surgery  1980's?  . Left heart catheterization with coronary angiogram N/A 10/13/2011    Procedure: LEFT HEART CATHETERIZATION WITH CORONARY ANGIOGRAM;  Surgeon: Peter M Martinique, MD;  Location: [facility] CATH LAB;  Service: Cardiovascular;  Laterality: N/A;  . Fractional flow reserve wire Right 10/13/2011    Procedure: FRACTIONAL FLOW RESERVE WIRE;  Surgeon: Peter M Martinique, MD;  Location: [facility] CATH LAB;  Service: Cardiovascular;  Laterality: Right;  . Percutaneous coronary stent intervention (pci-s) Right 10/13/2011    Procedure: PERCUTANEOUS CORONARY STENT INTERVENTION (PCI-S);  Surgeon: Peter M Martinique, MD;  Location: [facility] CATH LAB;  Service: Cardiovascular;  Laterality: Right;    MEDICATIONS: Current Outpatient Prescriptions on File Prior to Visit  Medication Sig Dispense Refill  . aspirin EC 81 MG tablet Take 81 mg by mouth daily.    . Cholecalciferol (VITAMIN D) 2000 UNITS CAPS Take 1 capsule by mouth daily.    Marland Kitchen ezetimibe (ZETIA) 10 MG tablet Take 5 mg by mouth daily.    . fish oil-omega-3 fatty acids 1000 MG capsule Take 1 g by mouth daily.    Marland Kitchen levocetirizine (XYZAL) 5 MG tablet Take 5 mg by mouth every evening.     . metoprolol tartrate (LOPRESSOR) 25 MG tablet Take 12.5 mg by mouth daily.    . Multiple Vitamins-Minerals (MULTIVITAMINS THER.  W/MINERALS) TABS Take 1 tablet by mouth daily.    . nitroGLYCERIN (NITROLINGUAL) 0.4 MG/SPRAY spray Place 1 spray under the tongue every 5 (five) minutes x 3 doses as needed for chest pain. 12 g 12  . pantoprazole (PROTONIX) 40 MG tablet Take 1 tablet (40 mg total) by mouth daily. 90 tablet 3  . rosuvastatin (CRESTOR) 40 MG tablet Take 0.5 tablets (20 mg total) by mouth daily. 90 tablet 3  . traMADol (ULTRAM) 50 MG tablet Take 50 mg by mouth every 6 (six) hours as needed. For pain    . vitamin C (ASCORBIC ACID) 500 MG tablet Take 500 mg by mouth daily.    . meclizine (ANTIVERT) 12.5 MG tablet Take 1 tablet (12.5 mg total) by mouth 3 (three) times daily as needed for dizziness. (Patient not taking: Reported on 09/22/2015) 30 tablet 0  . mometasone (NASONEX) 50 MCG/ACT nasal spray Place 2 sprays into the nose daily.    . verapamil (CALAN) 40 MG tablet Take 1 tablet (40 mg total) by mouth 3 (three) times daily. (Patient not taking: Reported on 09/22/2015) 45 tablet 0   No current facility-administered medications on file prior to visit.    ALLERGIES: Allergies  Allergen Reactions  . Penicillins     itching  . Sumatriptan     REACTION: sob, headache scalp sensitivity  . Oxycodone Itching    FAMILY HISTORY: Family History  Problem Relation Age of Onset  . Cancer Mother     lung  . Heart disease Father     heart attack   . Cancer Father     colon    SOCIAL HISTORY: Social History   Social History  . Marital Status: Married    Spouse Name: N/A  . Number of Children: N/A  . Years of Education: N/A   Occupational History  . Not on file.   Social History Main Topics  . Smoking status: Former Smoker -- 2.00 packs/day for 20 years    Types: Cigarettes    Quit date: 11/14/1982  . Smokeless tobacco: Never Used  . Alcohol Use: 3.6 oz/week    6 Cans of beer per week  . Drug Use: No  . Sexual Activity: Not Currently   Other Topics Concern  . Not on file   Social History  Narrative   Pt lives with his wife. 1 and a half story home  No issues with stairs. 12th grade education       REVIEW OF SYSTEMS: Constitutional: No fevers, chills, or sweats, no generalized fatigue, change in appetite Eyes: No visual changes, double vision, eye pain Ear, nose and throat: No hearing loss, ear pain, nasal congestion, sore throat Cardiovascular: No chest pain, palpitations Respiratory:  No shortness of breath at rest or  with exertion, wheezes GastrointestinaI: No nausea, vomiting, diarrhea, abdominal pain, fecal incontinence Genitourinary:  No dysuria, urinary retention or frequency Musculoskeletal:  No neck pain, back pain Integumentary: No rash, pruritus, skin lesions Neurological: as above Psychiatric: No depression, insomnia, anxiety Endocrine: No palpitations, fatigue, diaphoresis, mood swings, change in appetite, change in weight, increased thirst Hematologic/Lymphatic:  No anemia, purpura, petechiae. Allergic/Immunologic: no itchy/runny eyes, nasal congestion, recent allergic reactions, rashes  PHYSICAL EXAM: Filed Vitals:   09/22/15 0955  BP: 122/64  Pulse: 69   General: No acute distress.  Patient appears well-groomed.  Head:  Normocephalic/atraumatic Eyes:  fundi unremarkable, without vessel changes, exudates, hemorrhages or papilledema. Neck: supple, no paraspinal tenderness, full range of motion Back: No paraspinal tenderness Heart: regular rate and rhythm Lungs: Clear to auscultation bilaterally. Vascular: No carotid bruits. Neurological Exam: Mental status: alert and oriented to person, place, and time, recent and remote memory intact, fund of knowledge intact, attention and concentration intact, speech fluent and not dysarthric, language intact. Cranial nerves: CN I: not tested CN II: pupils equal, round and reactive to light, visual fields intact, fundi unremarkable, without vessel changes, exudates, hemorrhages or papilledema. CN III, IV, VI:   full range of motion, no nystagmus, no ptosis CN V: facial sensation intact CN VII: upper and lower face symmetric CN VIII: hearing intact CN IX, X: gag intact, uvula midline CN XI: sternocleidomastoid and trapezius muscles intact CN XII: tongue midline Bulk & Tone: normal, no fasciculations. Motor:  5/5 throughout Sensation: temperature sensation intact; decreased vibration sensation in feet. Deep Tendon Reflexes:  2+ throughout, toes downgoing. Finger to nose testing:  Without dysmetria.  Heel to shin:  Without dysmetria.  Gait:  Normal station and stride.  Able to turn.  Mild difficulty with tandem walking.  Romberg positive.  IMPRESSION: Cerebrovascular disease with evidence of remote occipital infarct.  It is demonstrated on brain imaging going back as far as 2012. Hypertension Hyperlipidemia CAD  PLAN: Continue secondary stroke prevention: 1.  ASA 81mg  daily 2.  Statin therapy (LDL goal should be less than 70) 3.  Continue blood pressure control Follow up as needed.  Thank you for allowing me to take part in the care of this patient.  Metta Clines, DO  CC:  Unice Cobble, MD  Billey Gosling, MD

## 2015-10-01 ENCOUNTER — Telehealth: Payer: Self-pay | Admitting: Internal Medicine

## 2015-10-01 NOTE — Telephone Encounter (Signed)
Called left vm to schedule AWV with Wynetta Fines

## 2015-10-07 ENCOUNTER — Ambulatory Visit: Payer: Commercial Managed Care - HMO | Admitting: Neurology

## 2015-10-16 DIAGNOSIS — Z85828 Personal history of other malignant neoplasm of skin: Secondary | ICD-10-CM | POA: Diagnosis not present

## 2015-10-16 DIAGNOSIS — L821 Other seborrheic keratosis: Secondary | ICD-10-CM | POA: Diagnosis not present

## 2015-10-16 DIAGNOSIS — L570 Actinic keratosis: Secondary | ICD-10-CM | POA: Diagnosis not present

## 2016-01-05 ENCOUNTER — Telehealth: Payer: Self-pay | Admitting: Internal Medicine

## 2016-01-14 DIAGNOSIS — J301 Allergic rhinitis due to pollen: Secondary | ICD-10-CM | POA: Diagnosis not present

## 2016-01-14 DIAGNOSIS — H1045 Other chronic allergic conjunctivitis: Secondary | ICD-10-CM | POA: Diagnosis not present

## 2016-01-14 DIAGNOSIS — J3089 Other allergic rhinitis: Secondary | ICD-10-CM | POA: Diagnosis not present

## 2016-01-14 DIAGNOSIS — J3081 Allergic rhinitis due to animal (cat) (dog) hair and dander: Secondary | ICD-10-CM | POA: Diagnosis not present

## 2016-01-18 ENCOUNTER — Encounter: Payer: Self-pay | Admitting: Cardiology

## 2016-01-18 ENCOUNTER — Ambulatory Visit (INDEPENDENT_AMBULATORY_CARE_PROVIDER_SITE_OTHER): Payer: Commercial Managed Care - HMO | Admitting: Cardiology

## 2016-01-18 VITALS — BP 124/64 | HR 55 | Ht 73.0 in | Wt 216.1 lb

## 2016-01-18 DIAGNOSIS — I2510 Atherosclerotic heart disease of native coronary artery without angina pectoris: Secondary | ICD-10-CM | POA: Diagnosis not present

## 2016-01-18 DIAGNOSIS — I10 Essential (primary) hypertension: Secondary | ICD-10-CM

## 2016-01-18 DIAGNOSIS — E782 Mixed hyperlipidemia: Secondary | ICD-10-CM

## 2016-01-18 LAB — BASIC METABOLIC PANEL
BUN: 15 mg/dL (ref 7–25)
CHLORIDE: 100 mmol/L (ref 98–110)
CO2: 27 mmol/L (ref 20–31)
Calcium: 9.3 mg/dL (ref 8.6–10.3)
Creat: 0.94 mg/dL (ref 0.70–1.18)
GLUCOSE: 107 mg/dL — AB (ref 65–99)
POTASSIUM: 4.8 mmol/L (ref 3.5–5.3)
SODIUM: 139 mmol/L (ref 135–146)

## 2016-01-18 LAB — LIPID PANEL
CHOL/HDL RATIO: 2.7 ratio (ref <=5.0)
CHOLESTEROL: 175 mg/dL (ref 125–200)
HDL: 65 mg/dL (ref >=40)
LDL Cholesterol: 85 mg/dL (ref <130)
Triglycerides: 123 mg/dL (ref <150)
VLDL: 25 mg/dL (ref <30)

## 2016-01-18 LAB — HEPATIC FUNCTION PANEL
ALT: 23 U/L (ref 9–46)
AST: 25 U/L (ref 10–35)
Albumin: 4.4 g/dL (ref 3.6–5.1)
Alkaline Phosphatase: 57 U/L (ref 40–115)
BILIRUBIN DIRECT: 0.1 mg/dL (ref <=0.2)
BILIRUBIN TOTAL: 0.5 mg/dL (ref 0.2–1.2)
Indirect Bilirubin: 0.4 mg/dL (ref 0.2–1.2)
Total Protein: 7.2 g/dL (ref 6.1–8.1)

## 2016-01-18 MED ORDER — NITROGLYCERIN 0.4 MG/SPRAY TL SOLN: 1.0000 | Status: DC | PRN

## 2016-01-18 NOTE — Progress Notes (Signed)
Robert [name] Date of Birth: 01/28/1946 Medical Record [id number]  History of Present Illness: Robert [name] is seen today for follow up CAD.  He has had a  NSTEMI with DES to the proximal RCA in November 2012. EF was normal.  Stress Myoview in March 2015 was Normal. 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  He has no recurrent chest pain. He remains  active. He has a [place] that keeps him busy.He denies any chest pain, SOB, or palpitations. No other new medical problems.  Current Outpatient Prescriptions on File Prior to Visit  Medication Sig Dispense Refill  . aspirin EC 81 MG tablet Take 81 mg by mouth daily.    . Cholecalciferol (VITAMIN D) 2000 UNITS CAPS Take 1 capsule by mouth daily.    Marland Kitchen ezetimibe (ZETIA) 10 MG tablet Take 5 mg by mouth daily.    . fish oil-omega-3 fatty acids 1000 MG capsule Take 1 g by mouth daily.    Marland Kitchen levocetirizine (XYZAL) 5 MG tablet Take 5 mg by mouth every evening.     . meclizine (ANTIVERT) 12.5 MG tablet Take 1 tablet (12.5 mg total) by mouth 3 (three) times daily as needed for dizziness. (Patient not taking: Reported on 09/22/2015) 30 tablet 0  . metoprolol tartrate (LOPRESSOR) 25 MG tablet Take 12.5 mg by mouth daily.    . mometasone (NASONEX) 50 MCG/ACT nasal spray Place 2 sprays into the nose daily.    . Multiple Vitamins-Minerals (MULTIVITAMINS THER. W/MINERALS) TABS Take 1 tablet by mouth daily.    . nitroGLYCERIN (NITROLINGUAL) 0.4 MG/SPRAY spray Place 1 spray under the tongue every 5 (five) minutes x 3 doses as needed for chest pain. 12 g 12  . pantoprazole (PROTONIX) 40 MG tablet Take 1 tablet (40 mg total) by mouth daily. 90 tablet 3  . rosuvastatin (CRESTOR) 40 MG tablet Take 0.5 tablets (20 mg total) by mouth daily. 90 tablet 3  . traMADol (ULTRAM) 50 MG tablet Take 50 mg by mouth every 6 (six) hours as needed. For pain    . verapamil (CALAN) 40 MG tablet Take 1 tablet (40 mg total) by mouth 3  (three) times daily. (Patient not taking: Reported on 09/22/2015) 45 tablet 0  . vitamin C (ASCORBIC ACID) 500 MG tablet Take 500 mg by mouth daily.     No current facility-administered medications on file prior to visit.    Allergies  Allergen Reactions  . Penicillins     itching  . Sumatriptan     REACTION: sob, headache scalp sensitivity  . Oxycodone Itching    Past Medical History  Diagnosis Date  . GERD (gastroesophageal reflux disease)   . CAD (coronary artery disease) 10/10/2011    NSTEMI with DES to the proximal RCA following flow wire evaluation. EF is normal.   . Hyperlipidemia   . Hypertension   . 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 Myocardial infarction ([place]) 10/2011  . OSA on CPAP     "wear it most of the time" (01/14/2014)  . History of stomach ulcers   . H/O esophageal ulcer   . Migraines     "quite often; maybe q 10d to 2 wk" (01/14/2014)    Past Surgical History  Procedure Laterality Date  . Back surgery    . Shoulder arthroscopy w/ rotator cuff repair Left 1990's  . Trigger finger release Left     3rd and  4th digits  . Coronary angioplasty with stent placement  10/13/2011    DES to the proximal RCA. Dr Martinique  . Inguinal hernia repair  10/23/2012    Procedure: HERNIA REPAIR INGUINAL ADULT;  Surgeon: Odis Hollingshead, MD;  Location: [place];  Service: General;  Laterality: Left;  Left lower quadrant; left inguinal hernia repair with mesh  . Insertion of mesh  10/23/2012    Procedure: INSERTION OF MESH;  Surgeon: Odis Hollingshead, MD;  Location: [place];  Service: General;  Laterality: Left;  Left lower quadrant  . Hernia repair    . Cervical disc surgery  1980's?  . Left heart catheterization with coronary angiogram N/A 10/13/2011    Procedure: LEFT HEART CATHETERIZATION WITH CORONARY ANGIOGRAM;  Surgeon: Taiwana Willison M Martinique, MD;  Location: [facility] CATH LAB;  Service: Cardiovascular;  Laterality: N/A;  . Fractional flow reserve wire Right 10/13/2011     Procedure: FRACTIONAL FLOW RESERVE WIRE;  Surgeon: Isaura Schiller M Martinique, MD;  Location: [facility] CATH LAB;  Service: Cardiovascular;  Laterality: Right;  . Percutaneous coronary stent intervention (pci-s) Right 10/13/2011    Procedure: PERCUTANEOUS CORONARY STENT INTERVENTION (PCI-S);  Surgeon: Aerika Groll M Martinique, MD;  Location: [facility] CATH LAB;  Service: Cardiovascular;  Laterality: Right;    History  Smoking status  . Former Smoker -- 2.00 packs/day for 20 years  . Types: Cigarettes  . Quit date: 11/14/1982  Smokeless tobacco  . Never Used    History  Alcohol Use  . 3.6 oz/week  . 6 Cans of beer per week    Family History  Problem Relation Age of Onset  . Cancer Mother     lung  . Heart disease Father     heart attack   . Cancer Father     colon    Review of Systems: The review of systems is per the HPI.  All other systems were reviewed and are negative.  Physical Exam: There were no vitals taken for this visit. Patient is  pleasant and in no acute distress. Skin is warm and dry. Color is normal.  HEENT is unremarkable. Normocephalic/atraumatic. PERRL. Sclera are nonicteric. Neck is supple. No masses. No JVD. Lungs are clear. Cardiac exam shows a regular rate and rhythm.normal S1 and S2 without gallop or murmur. Abdomen is soft. No masses or bruits.Extremities are without edema.pedal pulses are good. Gait and ROM are intact. No gross neurologic deficits noted.   LABORATORY DATA: Ecg: today- NSR rate 55. Normal Ecg.  I have personally reviewed and interpreted this study.   Assessment / Plan: 1. Coronary artery disease status post drug-eluting stent to the proximal RCA in November 2012. Myoview March 2015 was normal. Asymptomatic. Continue ASA, statin, metoprolol. Follow up in one year.  2. Hyperlipidemia. Continue crestor and Zetia- will check fasting lab work today.  3. Hypertension well controlled.

## 2016-01-28 ENCOUNTER — Other Ambulatory Visit: Payer: Self-pay | Admitting: Cardiology

## 2016-01-28 MED ORDER — NITROGLYCERIN 0.4 MG SL SUBL: 0.4000 mg | SUBLINGUAL_TABLET | SUBLINGUAL | Status: DC | PRN

## 2016-01-28 NOTE — Telephone Encounter (Signed)
Rx(s) sent to pharmacy electronically.  

## 2016-03-08 ENCOUNTER — Telehealth: Payer: Self-pay | Admitting: Internal Medicine

## 2016-03-08 DIAGNOSIS — M6530 Trigger finger, unspecified finger: Secondary | ICD-10-CM

## 2016-03-08 NOTE — Telephone Encounter (Signed)
Pt was wondering if Dr. Quay Burow can go ahead put in referral or hand specialist Dr. Limmie Patricia. Pt stated he has trigger finger and he had 4 operation on this already this will be the 5th one. Please advise.

## 2016-03-08 NOTE — Telephone Encounter (Signed)
Spoke with pt to inform.  

## 2016-03-08 NOTE — Telephone Encounter (Signed)
Please advise 

## 2016-03-08 NOTE — Telephone Encounter (Signed)
ordered

## 2016-03-21 DIAGNOSIS — M65321 Trigger finger, right index finger: Secondary | ICD-10-CM | POA: Diagnosis not present

## 2016-03-21 DIAGNOSIS — M19041 Primary osteoarthritis, right hand: Secondary | ICD-10-CM | POA: Diagnosis not present

## 2016-04-20 DIAGNOSIS — M65321 Trigger finger, right index finger: Secondary | ICD-10-CM | POA: Diagnosis not present

## 2016-06-10 DIAGNOSIS — M542 Cervicalgia: Secondary | ICD-10-CM | POA: Diagnosis not present

## 2016-06-10 DIAGNOSIS — M47812 Spondylosis without myelopathy or radiculopathy, cervical region: Secondary | ICD-10-CM | POA: Diagnosis not present

## 2016-06-10 DIAGNOSIS — M5030 Other cervical disc degeneration, unspecified cervical region: Secondary | ICD-10-CM | POA: Diagnosis not present

## 2016-06-10 DIAGNOSIS — R51 Headache: Secondary | ICD-10-CM | POA: Diagnosis not present

## 2016-06-20 ENCOUNTER — Encounter: Payer: Self-pay | Admitting: Family

## 2016-06-20 ENCOUNTER — Ambulatory Visit (INDEPENDENT_AMBULATORY_CARE_PROVIDER_SITE_OTHER): Payer: Commercial Managed Care - HMO | Admitting: Family

## 2016-06-20 DIAGNOSIS — M4302 Spondylolysis, cervical region: Secondary | ICD-10-CM

## 2016-06-20 MED ORDER — CYCLOBENZAPRINE HCL 10 MG PO TABS: 5.0000 mg | ORAL_TABLET | Freq: Three times a day (TID) | ORAL | 0 refills | Status: DC | PRN

## 2016-06-20 NOTE — Assessment & Plan Note (Addendum)
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 Continue current dosage of diclofenac. Start cyclobenzaprine as needed for muscle spasms. Initiate ice regimen to help with discomfort as well as home exercise therapy. Referral to physical therapy placed as this is unlikely to improve with home therapy independently. Additional nonpharmacological methods discussed including over-the-counter creams and patches. Follow-up with neurosurgery as scheduled or if symptoms worsen or do not improve.

## 2016-06-20 NOTE — Patient Instructions (Signed)
Thank you for choosing [facility].  Summary/Instructions:  Please continue to take your medications as prescribed.  Ice x 20 minutes every 2 hours and after activity.  Stretching multiple times throughout the day.  They will call to schedule physical therapy.   Your prescription(s) have been submitted to your pharmacy or been printed and provided for you. Please take as directed and contact our office if you believe you are having problem(s) with the medication(s) or have any questions.  If your symptoms worsen or fail to improve, please contact our office for further instruction, or in case of emergency go directly to the emergency room at the closest medical facility.     Cervical Strain and Sprain With Rehab Cervical strain and sprain are injuries that commonly occur with "whiplash" injuries. Whiplash occurs when the neck is forcefully whipped backward or forward, such as during a motor vehicle accident or during contact sports. The muscles, ligaments, tendons, discs, and nerves of the neck are susceptible to injury when this occurs. RISK FACTORS Risk of having a whiplash injury increases if:  Osteoarthritis of the spine.  Situations that make head or neck accidents or trauma more likely.  High-risk sports (football, rugby, wrestling, hockey, auto racing, gymnastics, diving, contact karate, or boxing).  Poor strength and flexibility of the neck.  Previous neck injury.  Poor tackling technique.  Improperly fitted or padded equipment. SYMPTOMS   Pain or stiffness in the front or back of neck or both.  Symptoms may present immediately or up to 24 hours after injury.  Dizziness, headache, nausea, and vomiting.  Muscle spasm with soreness and stiffness in the neck.  Tenderness and swelling at the injury site. PREVENTION  Learn and use proper technique (avoid tackling with the head, spearing, and head-butting; use proper falling techniques to avoid landing on the  head).  Warm up and stretch properly before activity.  Maintain physical fitness:  Strength, flexibility, and endurance.  Cardiovascular fitness.  Wear properly fitted and padded protective equipment, such as padded soft collars, for participation in contact sports. PROGNOSIS  Recovery from cervical strain and sprain injuries is dependent on the extent of the injury. These injuries are usually curable in 1 week to 3 months with appropriate treatment.  RELATED COMPLICATIONS   Temporary numbness and weakness may occur if the nerve roots are damaged, and this may persist until the nerve has completely healed.  Chronic pain due to frequent recurrence of symptoms.  Prolonged healing, especially if activity is resumed too soon (before complete recovery). TREATMENT  Treatment initially involves the use of ice and medication to help reduce pain and inflammation. It is also important to perform strengthening and stretching exercises and modify activities that worsen symptoms so the injury does not get worse. These exercises may be performed at home or with a therapist. For patients who experience severe symptoms, a soft, padded collar may be recommended to be worn around the neck.  Improving your posture may help reduce symptoms. Posture improvement includes pulling your chin and abdomen in while sitting or standing. If you are sitting, sit in a firm chair with your buttocks against the back of the chair. While sleeping, try replacing your pillow with a small towel rolled to 2 inches in diameter, or use a cervical pillow or soft cervical collar. Poor sleeping positions delay healing.  For patients with nerve root damage, which causes numbness or weakness, the use of a cervical traction apparatus may be recommended. Surgery is rarely necessary for  these injuries. However, cervical strain and sprains that are present at birth (congenital) may require surgery. MEDICATION   If pain medication is  necessary, nonsteroidal anti-inflammatory medications, such as aspirin and ibuprofen, or other minor pain relievers, such as acetaminophen, are often recommended.  Do not take pain medication for 7 days before surgery.  Prescription pain relievers may be given if deemed necessary by your caregiver. Use only as directed and only as much as you need. HEAT AND COLD:   Cold treatment (icing) relieves pain and reduces inflammation. Cold treatment should be applied for 10 to 15 minutes every 2 to 3 hours for inflammation and pain and immediately after any activity that aggravates your symptoms. Use ice packs or an ice massage.  Heat treatment may be used prior to performing the stretching and strengthening activities prescribed by your caregiver, physical therapist, or athletic trainer. Use a heat pack or a warm soak. SEEK MEDICAL CARE IF:   Symptoms get worse or do not improve in 2 weeks despite treatment.  New, unexplained symptoms develop (drugs used in treatment may produce side effects). EXERCISES RANGE OF MOTION (ROM) AND STRETCHING EXERCISES - Cervical Strain and Sprain These exercises may help you when beginning to rehabilitate your injury. In order to successfully resolve your symptoms, you must improve your posture. These exercises are designed to help reduce the forward-head and rounded-shoulder posture which contributes to this condition. Your symptoms may resolve with or without further involvement from your physician, physical therapist or athletic trainer. While completing these exercises, remember:   Restoring tissue flexibility helps normal motion to return to the joints. This allows healthier, less painful movement and activity.  An effective stretch should be held for at least 20 seconds, although you may need to begin with shorter hold times for comfort.  A stretch should never be painful. You should only feel a gentle lengthening or release in the stretched tissue. STRETCH-  Axial Extensors  Lie on your back on the floor. You may bend your knees for comfort. Place a rolled-up hand towel or dish towel, about 2 inches in diameter, under the part of your head that makes contact with the floor.  Gently tuck your chin, as if trying to make a "double chin," until you feel a gentle stretch at the base of your head.  Hold __________ seconds. Repeat __________ times. Complete this exercise __________ times per day.  STRETCH - Axial Extension   Stand or sit on a firm surface. Assume a good posture: chest up, shoulders drawn back, abdominal muscles slightly tense, knees unlocked (if standing) and feet hip width apart.  Slowly retract your chin so your head slides back and your chin slightly lowers. Continue to look straight ahead.  You should feel a gentle stretch in the back of your head. Be certain not to feel an aggressive stretch since this can cause headaches later.  Hold for __________ seconds. Repeat __________ times. Complete this exercise __________ times per day. STRETCH - Cervical Side Bend   Stand or sit on a firm surface. Assume a good posture: chest up, shoulders drawn back, abdominal muscles slightly tense, knees unlocked (if standing) and feet hip width apart.  Without letting your nose or shoulders move, slowly tip your right / left ear to your shoulder until your feel a gentle stretch in the muscles on the opposite side of your neck.  Hold __________ seconds. Repeat __________ times. Complete this exercise __________ times per day. STRETCH - Cervical Rotators  Stand or sit on a firm surface. Assume a good posture: chest up, shoulders drawn back, abdominal muscles slightly tense, knees unlocked (if standing) and feet hip width apart.  Keeping your eyes level with the ground, slowly turn your head until you feel a gentle stretch along the back and opposite side of your neck.  Hold __________ seconds. Repeat __________ times. Complete this exercise  __________ times per day. RANGE OF MOTION - Neck Circles   Stand or sit on a firm surface. Assume a good posture: chest up, shoulders drawn back, abdominal muscles slightly tense, knees unlocked (if standing) and feet hip width apart.  Gently roll your head down and around from the back of one shoulder to the back of the other. The motion should never be forced or painful.  Repeat the motion 10-20 times, or until you feel the neck muscles relax and loosen. Repeat __________ times. Complete the exercise __________ times per day. STRENGTHENING EXERCISES - Cervical Strain and Sprain These exercises may help you when beginning to rehabilitate your injury. They may resolve your symptoms with or without further involvement from your physician, physical therapist, or athletic trainer. While completing these exercises, remember:   Muscles can gain both the endurance and the strength needed for everyday activities through controlled exercises.  Complete these exercises as instructed by your physician, physical therapist, or athletic trainer. Progress the resistance and repetitions only as guided.  You may experience muscle soreness or fatigue, but the pain or discomfort you are trying to eliminate should never worsen during these exercises. If this pain does worsen, stop and make certain you are following the directions exactly. If the pain is still present after adjustments, discontinue the exercise until you can discuss the trouble with your clinician. STRENGTH - Cervical Flexors, Isometric  Face a wall, standing about 6 inches away. Place a small pillow, a ball about 6-8 inches in diameter, or a folded towel between your forehead and the wall.  Slightly tuck your chin and gently push your forehead into the soft object. Push only with mild to moderate intensity, building up tension gradually. Keep your jaw and forehead relaxed.  Hold 10 to 20 seconds. Keep your breathing relaxed.  Release the  tension slowly. Relax your neck muscles completely before you start the next repetition. Repeat __________ times. Complete this exercise __________ times per day. STRENGTH- Cervical Lateral Flexors, Isometric   Stand about 6 inches away from a wall. Place a small pillow, a ball about 6-8 inches in diameter, or a folded towel between the side of your head and the wall.  Slightly tuck your chin and gently tilt your head into the soft object. Push only with mild to moderate intensity, building up tension gradually. Keep your jaw and forehead relaxed.  Hold 10 to 20 seconds. Keep your breathing relaxed.  Release the tension slowly. Relax your neck muscles completely before you start the next repetition. Repeat __________ times. Complete this exercise __________ times per day. STRENGTH - Cervical Extensors, Isometric   Stand about 6 inches away from a wall. Place a small pillow, a ball about 6-8 inches in diameter, or a folded towel between the back of your head and the wall.  Slightly tuck your chin and gently tilt your head back into the soft object. Push only with mild to moderate intensity, building up tension gradually. Keep your jaw and forehead relaxed.  Hold 10 to 20 seconds. Keep your breathing relaxed.  Release the tension slowly. Relax your neck  muscles completely before you start the next repetition. Repeat __________ times. Complete this exercise __________ times per day. POSTURE AND BODY MECHANICS CONSIDERATIONS - Cervical Strain and Sprain Keeping correct posture when sitting, standing or completing your activities will reduce the stress put on different body tissues, allowing injured tissues a chance to heal and limiting painful experiences. The following are general guidelines for improved posture. Your physician or physical therapist will provide you with any instructions specific to your needs. While reading these guidelines, remember:  The exercises prescribed by your provider  will help you have the flexibility and strength to maintain correct postures.  The correct posture provides the optimal environment for your joints to work. All of your joints have less wear and tear when properly supported by a spine with good posture. This means you will experience a healthier, less painful body.  Correct posture must be practiced with all of your activities, especially prolonged sitting and standing. Correct posture is as important when doing repetitive low-stress activities (typing) as it is when doing a single heavy-load activity (lifting). PROLONGED STANDING WHILE SLIGHTLY LEANING FORWARD When completing a task that requires you to lean forward while standing in one place for a long time, place either foot up on a stationary 2- to 4-inch high object to help maintain the best posture. When both feet are on the ground, the low back tends to lose its slight inward curve. If this curve flattens (or becomes too large), then the back and your other joints will experience too much stress, fatigue more quickly, and can cause pain.  RESTING POSITIONS Consider which positions are most painful for you when choosing a resting position. If you have pain with flexion-based activities (sitting, bending, stooping, squatting), choose a position that allows you to rest in a less flexed posture. You would want to avoid curling into a fetal position on your side. If your pain worsens with extension-based activities (prolonged standing, working overhead), avoid resting in an extended position such as sleeping on your stomach. Most people will find more comfort when they rest with their spine in a more neutral position, neither too rounded nor too arched. Lying on a non-sagging bed on your side with a pillow between your knees, or on your back with a pillow under your knees will often provide some relief. Keep in mind, being in any one position for a prolonged period of time, no matter how correct your  posture, can still lead to stiffness. WALKING Walk with an upright posture. Your ears, shoulders, and hips should all line up. OFFICE WORK When working at a desk, create an environment that supports good, upright posture. Without extra support, muscles fatigue and lead to excessive strain on joints and other tissues. CHAIR:  A chair should be able to slide under your desk when your back makes contact with the back of the chair. This allows you to work closely.  The chair's height should allow your eyes to be level with the upper part of your monitor and your hands to be slightly lower than your elbows.  Body position:  Your feet should make contact with the floor. If this is not possible, use a foot rest.  Keep your ears over your shoulders. This will reduce stress on your neck and low back.   This information is not intended to replace advice given to you by your health care provider. Make sure you discuss any questions you have with your health care provider.   Document Released:  10/31/2005 Document Revised: 11/21/2014 Document Reviewed: 02/12/2009 [employer] Interactive Patient Education [employer].

## 2016-06-20 NOTE — Progress Notes (Signed)
Subjective:    Patient ID: Robert [name], male    DOB: 1946-08-02, 70 y.o.   MRN: [id number]  Chief Complaint  Patient presents with  . Neck Pain    having alot of pain in his neck, had an MRI with alot of findings was put on diclofenac    HPI:  Robert [name] is a 70 y.o. 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 and presents today for a follow up.  This is a new problem. Associated symptom of pain located located in his cervical spine has been going on for . He has spoken with Dr. Astrid Drafts of neurosurgery with the recommendation of starting Diclofenac which has helped a little but continues to experience pain. Pain is described as sharp and constant and aggravated by any movements of his neck. MRI results showSeveral areas of concern including C3-4 with mild disc bulging and uncovertebral hypertrophy with severe right and mild left zygapophyseal facet arthrosis and severe right and mild left neural formainal narrowing;C4-C5 with mild disc height loss and moderate broad disc osteophyte complex. Mild impression on the vertebral cord surface with severe left and moderate right uncovertebral hypertrophy. There is also moderate right facet hypertrophy and severe left neural foraminal narrowing.; C5-6 with severe disc height loss and moderate broad disc osteophyte complex asymmetric to the right has mild impression on the right ventral cord surface and severe bilateral" currently vertebral her hypertrophy and mild to moderate bilateral neural foraminal narrowing; C6-7 mild to moderate disc height loss with mild to moderate broad disc osteophyte complex asymmetric to the right. Mild impression on the right ventral cord surface and moderate to  severe bilateral uncovertebral hypertrophy. Mild to moderate bilateral neural foraminal narrowing. C7-T1 mild disc height loss with mild broad disc osteophyte complex. Mild uncovertebral facet arthrosis and no central canal or neural foraminal narrowing. Overall impression with cervical spondylolysis.  He is scheduled to go back see neurosurgery in a couple of months. Family notes that it is effecting movement and has altered his day-to-day activities is in not fully truthful with the pain that he is in. There are occasional pops and cracks. No radiculopathies. No trauma that he can recall. Does have a history of a bulging disc that was operated on.   Allergies  Allergen Reactions  . Penicillins     itching  . Sumatriptan     REACTION: sob, headache scalp sensitivity  . Oxycodone Itching     Current Outpatient Prescriptions on File Prior to Visit  Medication Sig Dispense Refill  . aspirin EC 81 MG tablet Take 81 mg by mouth daily.    . Cholecalciferol (VITAMIN D) 2000 UNITS CAPS Take 1 capsule by mouth daily.    Marland Kitchen ezetimibe (ZETIA) 10 MG tablet Take 5 mg by mouth daily.    . fish oil-omega-3 fatty acids 1000 MG capsule Take 1 g by mouth daily.    Marland Kitchen levocetirizine (XYZAL) 5 MG tablet Take 5 mg by mouth every evening.     . meclizine (ANTIVERT) 12.5 MG tablet Take 1 tablet (12.5 mg total) by mouth 3 (three) times daily as needed for dizziness. 30 tablet 0  . metoprolol tartrate (LOPRESSOR) 25 MG tablet Take 12.5 mg by mouth daily.    . mometasone (NASONEX) 50 MCG/ACT nasal  spray Place 2 sprays into the nose daily. Reported on 01/18/2016    . Multiple Vitamins-Minerals (MULTIVITAMINS THER. W/MINERALS) TABS Take 1 tablet by mouth daily.    . nitroGLYCERIN (NITROSTAT) 0.4 MG SL tablet Place 1 tablet (0.4 mg total) under the tongue every 5 (five) minutes as needed for chest pain. MAX 3 doses 25 tablet 3  . pantoprazole (PROTONIX) 40 MG tablet Take 1 tablet (40 mg total) by mouth daily. 90 tablet 3    . rosuvastatin (CRESTOR) 40 MG tablet Take 0.5 tablets (20 mg total) by mouth daily. 90 tablet 3  . traMADol (ULTRAM) 50 MG tablet Take 50 mg by mouth every 6 (six) hours as needed. For pain    . verapamil (CALAN) 40 MG tablet Take 1 tablet (40 mg total) by mouth 3 (three) times daily. 45 tablet 0  . vitamin C (ASCORBIC ACID) 500 MG tablet Take 500 mg by mouth daily.     No current facility-administered medications on file prior to visit.     Past Medical History:  Diagnosis Date  . CAD (coronary artery disease) 10/10/2011   NSTEMI with DES to the proximal RCA following flow wire evaluation. EF is normal.   . GERD (gastroesophageal reflux disease)   . H/O esophageal ulcer   . History of stomach ulcers   . Hyperlipidemia   . Hypertension   . Migraines    "quite often; maybe q 10d to 2 wk" (01/14/2014)  . Myocardial infarction ([place]) 10/2011  . OSA on CPAP    "wear it most of the time" (01/14/2014)  . 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 BACK SURGERY    . CERVICAL DISC SURGERY  1980's?  . CORONARY ANGIOPLASTY WITH STENT PLACEMENT  10/13/2011   DES to the proximal RCA. Dr Martinique  . FRACTIONAL FLOW RESERVE WIRE Right 10/13/2011   Procedure: FRACTIONAL FLOW RESERVE WIRE;  Surgeon: Peter M Martinique, MD;  Location: [facility] CATH LAB;  Service: Cardiovascular;  Laterality: Right;  . HERNIA REPAIR    . INGUINAL HERNIA REPAIR  10/23/2012   Procedure: HERNIA REPAIR INGUINAL ADULT;  Surgeon: Odis Hollingshead, MD;  Location: [place];  Service: General;  Laterality: Left;  Left lower quadrant; left inguinal hernia repair with mesh  . INSERTION OF MESH  10/23/2012   Procedure: INSERTION OF MESH;  Surgeon: Odis Hollingshead, MD;  Location: [place];  Service: General;  Laterality: Left;  Left lower quadrant  . LEFT HEART CATHETERIZATION WITH CORONARY ANGIOGRAM N/A 10/13/2011   Procedure: LEFT HEART CATHETERIZATION WITH CORONARY ANGIOGRAM;   Surgeon: Peter M Martinique, MD;  Location: [facility] CATH LAB;  Service: Cardiovascular;  Laterality: N/A;  . PERCUTANEOUS CORONARY STENT INTERVENTION (PCI-S) Right 10/13/2011   Procedure: PERCUTANEOUS CORONARY STENT INTERVENTION (PCI-S);  Surgeon: Peter M Martinique, MD;  Location: [facility] CATH LAB;  Service: Cardiovascular;  Laterality: Right;  . SHOULDER ARTHROSCOPY W/ ROTATOR CUFF REPAIR Left 1990's  . TRIGGER FINGER RELEASE Left    3rd and 4th digits     Review of Systems  Constitutional: Negative for chills and fever.  Musculoskeletal: Positive for neck pain and neck stiffness.  Neurological: Negative for weakness and numbness.      Objective:    BP 134/66 (BP Location: Left Arm, Patient Position: Sitting, Cuff Size: Large)   Pulse (!) 53   Temp 97.5 F (36.4 C) (Oral)   Resp 16  Ht 6\' 1"  (1.854 m)   Wt 221 lb (100.2 kg)   SpO2 96%   BMI 29.16 kg/m  Nursing note and vital signs reviewed.  Physical Exam  Constitutional: He is oriented to person, place, and time. He appears well-developed and well-nourished. No distress.  Neck:  No obvious deformity, discoloration, or edema. There is significant muscle tightness and spasm located on the left side of the cervical paraspinal musculature. Range of motion is severely restricted in lateral bending and right rotation. Flexion and extension are within normal limits with discomfort noted upon full flexion and full extension. Spurling's and cervical compression test negative. Distal pulses and sensation are intact and appropriate.  Cardiovascular: Normal rate, regular rhythm, normal heart sounds and intact distal pulses.   Pulmonary/Chest: Effort normal and breath sounds normal.  Neurological: He is alert and oriented to person, place, and time.  Skin: Skin is warm and dry.  Psychiatric: He has a normal mood and affect. His behavior is normal. Judgment and thought content normal.       Assessment & Plan:   Problem List Items Addressed This Visit       Musculoskeletal and Integument   Cervical spondylolysis    Symptoms and exam consistent with cervical spondylolysis and multiple facet joint hypertrophy of various levels in the setting or previous cervical surgery which appeared to be resolved. Continue current dosage of diclofenac. Start cyclobenzaprine as needed for muscle spasms. Initiate ice regimen to help with discomfort as well as home exercise therapy. Referral to physical therapy placed as this is unlikely to improve with home therapy independently. Additional nonpharmacological methods discussed including over-the-counter creams and patches. Follow-up with neurosurgery as scheduled or if symptoms worsen or do not improve.       Relevant Medications   diclofenac (VOLTAREN) 50 MG EC tablet   cyclobenzaprine (FLEXERIL) 10 MG tablet   Other Relevant Orders   Ambulatory referral to Physical Therapy    Other Visit Diagnoses   None.      I am having Mr. Dema start on cyclobenzaprine. I am also having him maintain his levocetirizine, traMADol, pantoprazole, ezetimibe, multivitamins ther. w/minerals, vitamin C, Vitamin D, fish oil-omega-3 fatty acids, aspirin EC, mometasone, metoprolol tartrate, rosuvastatin, verapamil, meclizine, nitroGLYCERIN, and diclofenac.   Meds ordered this encounter  Medications  . diclofenac (VOLTAREN) 50 MG EC tablet    Sig: Take 50 mg by mouth 2 (two) times daily.  . cyclobenzaprine (FLEXERIL) 10 MG tablet    Sig: Take 0.5-1 tablets (5-10 mg total) by mouth 3 (three) times daily as needed for muscle spasms.    Dispense:  30 tablet    Refill:  0    Order Specific Question:   Supervising Provider    Answer:   Pricilla Holm A [id number]     Follow-up: Return if symptoms worsen or fail to improve.  Mauricio Po, FNP

## 2016-06-29 DIAGNOSIS — M50321 Other cervical disc degeneration at C4-C5 level: Secondary | ICD-10-CM | POA: Diagnosis not present

## 2016-06-29 DIAGNOSIS — M542 Cervicalgia: Secondary | ICD-10-CM | POA: Diagnosis not present

## 2016-06-29 DIAGNOSIS — M50323 Other cervical disc degeneration at C6-C7 level: Secondary | ICD-10-CM | POA: Diagnosis not present

## 2016-06-29 DIAGNOSIS — M50322 Other cervical disc degeneration at C5-C6 level: Secondary | ICD-10-CM | POA: Diagnosis not present

## 2016-06-30 DIAGNOSIS — L218 Other seborrheic dermatitis: Secondary | ICD-10-CM | POA: Diagnosis not present

## 2016-06-30 DIAGNOSIS — L814 Other melanin hyperpigmentation: Secondary | ICD-10-CM | POA: Diagnosis not present

## 2016-06-30 DIAGNOSIS — L570 Actinic keratosis: Secondary | ICD-10-CM | POA: Diagnosis not present

## 2016-06-30 DIAGNOSIS — Z85828 Personal history of other malignant neoplasm of skin: Secondary | ICD-10-CM | POA: Diagnosis not present

## 2016-07-08 DIAGNOSIS — M50321 Other cervical disc degeneration at C4-C5 level: Secondary | ICD-10-CM | POA: Diagnosis not present

## 2016-07-08 DIAGNOSIS — M50322 Other cervical disc degeneration at C5-C6 level: Secondary | ICD-10-CM | POA: Diagnosis not present

## 2016-07-08 DIAGNOSIS — M50323 Other cervical disc degeneration at C6-C7 level: Secondary | ICD-10-CM | POA: Diagnosis not present

## 2016-07-08 DIAGNOSIS — M542 Cervicalgia: Secondary | ICD-10-CM | POA: Diagnosis not present

## 2016-07-11 DIAGNOSIS — M50321 Other cervical disc degeneration at C4-C5 level: Secondary | ICD-10-CM | POA: Diagnosis not present

## 2016-07-11 DIAGNOSIS — M50323 Other cervical disc degeneration at C6-C7 level: Secondary | ICD-10-CM | POA: Diagnosis not present

## 2016-07-11 DIAGNOSIS — M542 Cervicalgia: Secondary | ICD-10-CM | POA: Diagnosis not present

## 2016-07-11 DIAGNOSIS — M50322 Other cervical disc degeneration at C5-C6 level: Secondary | ICD-10-CM | POA: Diagnosis not present

## 2016-07-13 DIAGNOSIS — M50321 Other cervical disc degeneration at C4-C5 level: Secondary | ICD-10-CM | POA: Diagnosis not present

## 2016-07-13 DIAGNOSIS — M50322 Other cervical disc degeneration at C5-C6 level: Secondary | ICD-10-CM | POA: Diagnosis not present

## 2016-07-13 DIAGNOSIS — M542 Cervicalgia: Secondary | ICD-10-CM | POA: Diagnosis not present

## 2016-07-13 DIAGNOSIS — M50323 Other cervical disc degeneration at C6-C7 level: Secondary | ICD-10-CM | POA: Diagnosis not present

## 2016-08-10 DIAGNOSIS — M47812 Spondylosis without myelopathy or radiculopathy, cervical region: Secondary | ICD-10-CM | POA: Diagnosis not present

## 2016-08-10 DIAGNOSIS — M542 Cervicalgia: Secondary | ICD-10-CM | POA: Diagnosis not present

## 2016-08-10 DIAGNOSIS — R51 Headache: Secondary | ICD-10-CM | POA: Diagnosis not present

## 2016-08-10 DIAGNOSIS — M5030 Other cervical disc degeneration, unspecified cervical region: Secondary | ICD-10-CM | POA: Diagnosis not present

## 2016-09-23 DIAGNOSIS — R51 Headache: Secondary | ICD-10-CM | POA: Diagnosis not present

## 2016-09-23 DIAGNOSIS — G894 Chronic pain syndrome: Secondary | ICD-10-CM | POA: Insufficient documentation

## 2016-09-23 DIAGNOSIS — M5030 Other cervical disc degeneration, unspecified cervical region: Secondary | ICD-10-CM | POA: Diagnosis not present

## 2016-09-23 DIAGNOSIS — Z79899 Other long term (current) drug therapy: Secondary | ICD-10-CM | POA: Diagnosis not present

## 2016-09-23 DIAGNOSIS — M5412 Radiculopathy, cervical region: Secondary | ICD-10-CM | POA: Diagnosis not present

## 2016-09-23 DIAGNOSIS — Z5181 Encounter for therapeutic drug level monitoring: Secondary | ICD-10-CM | POA: Diagnosis not present

## 2016-09-23 DIAGNOSIS — G4486 Cervicogenic headache: Secondary | ICD-10-CM | POA: Insufficient documentation

## 2016-10-24 DIAGNOSIS — M5412 Radiculopathy, cervical region: Secondary | ICD-10-CM | POA: Diagnosis not present

## 2016-10-24 DIAGNOSIS — M542 Cervicalgia: Secondary | ICD-10-CM | POA: Diagnosis not present

## 2016-11-27 DIAGNOSIS — R7303 Prediabetes: Secondary | ICD-10-CM | POA: Insufficient documentation

## 2016-11-27 NOTE — Progress Notes (Signed)
Subjective:    Patient ID: Robert [name], male    DOB: 11/24/1945, 71 y.o.   MRN: [id number]  HPI He is here for an acute visit.   He does most of his medication throught the [place].  He sees them every 6 months and has blood work regularly.   He was diagnosed with bursitis in his buttock region.  He was diagnosed at the [place].  He has pain with driving long distances.  He has had this for years and it is intermittent, but not getting better.   He is interested in an injection.   Migraines:  He has migraines and has been getting botox through the [place].  His upper eye lids have gotten droopy over the years and it is affecting his vision.  It is worse at night when he is driving.  He wonders if he should have surgery.   CAD, Hypertension: He is taking his medication daily. He is compliant with a low sodium diet.  He denies chest pain, palpitations, edema, shortness of breath and regular headaches. He is active, but not exercising regularly.      GERD:  He is taking his medication daily as prescribed.  He denies any GERD symptoms and feels his GERD is well controlled.   Hyperlipidemia: He is taking his medication daily. He is compliant with a low fat/cholesterol diet. He is active, but not exercising regularly.    Prediabetes:  He is compliant with a low sugar/carbohydrate diet.  He is active, but is not exercising regularly.   Medications and allergies reviewed with patient and updated if appropriate.  Patient Active Problem List   Diagnosis Date Noted  . Prediabetes 11/27/2016  . Cervical spondylolysis 06/20/2016  . 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 Trigger finger, acquired 10/28/2014  . Dizziness and giddiness 07/05/2014  . NSTEMI (non-ST elevated myocardial infarction) ([place]) 10/13/2011  . HTN (hypertension) 10/11/2011  . CAD - RCA DES Nov 2012 10/10/2011  . Inguinal hernia without mention of obstruction or gangrene, unilateral or unspecified, (not specified as recurrent),  left 07/27/2011  . DRY EYE SYNDROME 01/29/2010  . Chronic headache disorder 01/29/2010  . SKIN CANCER, HX OF 03/03/2009  . HYPERLIPIDEMIA 01/11/2008  . Allergic rhinitis 01/07/2008  . PROSTATITIS, RECURRENT 01/07/2008  . GASTRIC ULCER, HX OF 01/07/2008  . COLONIC POLYPS, HX OF 01/07/2008  . GERD 04/02/2007  . SLEEP APNEA 04/02/2007    Current Outpatient Prescriptions on File Prior to Visit  Medication Sig Dispense Refill  . aspirin EC 81 MG tablet Take 81 mg by mouth daily.    . Cholecalciferol (VITAMIN D) 2000 UNITS CAPS Take 1 capsule by mouth daily.    . cyclobenzaprine (FLEXERIL) 10 MG tablet Take 0.5-1 tablets (5-10 mg total) by mouth 3 (three) times daily as needed for muscle spasms. 30 tablet 0  . ezetimibe (ZETIA) 10 MG tablet Take 5 mg by mouth daily.    . fish oil-omega-3 fatty acids 1000 MG capsule Take 1 g by mouth daily.    Marland Kitchen levocetirizine (XYZAL) 5 MG tablet Take 5 mg by mouth every evening.     . meclizine (ANTIVERT) 12.5 MG tablet Take 1 tablet (12.5 mg total) by mouth 3 (three) times daily as needed for dizziness. 30 tablet 0  . metoprolol tartrate (LOPRESSOR) 25 MG tablet Take 12.5 mg by mouth daily.    . mometasone (NASONEX) 50 MCG/ACT nasal spray Place 2 sprays into the nose daily. Reported on 01/18/2016    .  Multiple Vitamins-Minerals (MULTIVITAMINS THER. W/MINERALS) TABS Take 1 tablet by mouth daily.    . nitroGLYCERIN (NITROSTAT) 0.4 MG SL tablet Place 1 tablet (0.4 mg total) under the tongue every 5 (five) minutes as needed for chest pain. MAX 3 doses 25 tablet 3  . pantoprazole (PROTONIX) 40 MG tablet Take 1 tablet (40 mg total) by mouth daily. 90 tablet 3  . rosuvastatin (CRESTOR) 40 MG tablet Take 0.5 tablets (20 mg total) by mouth daily. 90 tablet 3  . traMADol (ULTRAM) 50 MG tablet Take 50 mg by mouth every 6 (six) hours as needed. For pain    . verapamil (CALAN) 40 MG tablet Take 1 tablet (40 mg total) by mouth 3 (three) times daily. 45 tablet 0  . vitamin C  (ASCORBIC ACID) 500 MG tablet Take 500 mg by mouth daily.    . diclofenac (VOLTAREN) 50 MG EC tablet Take 50 mg by mouth 2 (two) times daily.     No current facility-administered medications on file prior to visit.     Past Medical History:  Diagnosis Date  . CAD (coronary artery disease) 10/10/2011   NSTEMI with DES to the proximal RCA following flow wire evaluation. EF is normal.   . GERD (gastroesophageal reflux disease)   . H/O esophageal ulcer   . History of stomach ulcers   . Hyperlipidemia   . Hypertension   . Migraines    "quite often; maybe q 10d to 2 wk" (01/14/2014)  . Myocardial infarction 10/2011  . OSA on CPAP    "wear it most of the time" (01/14/2014)  . 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 BACK SURGERY    . CERVICAL DISC SURGERY  1980's?  . CORONARY ANGIOPLASTY WITH STENT PLACEMENT  10/13/2011   DES to the proximal RCA. Dr Martinique  . FRACTIONAL FLOW RESERVE WIRE Right 10/13/2011   Procedure: FRACTIONAL FLOW RESERVE WIRE;  Surgeon: Peter M Martinique, MD;  Location: [facility] CATH LAB;  Service: Cardiovascular;  Laterality: Right;  . HERNIA REPAIR    . INGUINAL HERNIA REPAIR  10/23/2012   Procedure: HERNIA REPAIR INGUINAL ADULT;  Surgeon: Odis Hollingshead, MD;  Location: [facility];  Service: General;  Laterality: Left;  Left lower quadrant; left inguinal hernia repair with mesh  . INSERTION OF MESH  10/23/2012   Procedure: INSERTION OF MESH;  Surgeon: Odis Hollingshead, MD;  Location: [place];  Service: General;  Laterality: Left;  Left lower quadrant  . LEFT HEART CATHETERIZATION WITH CORONARY ANGIOGRAM N/A 10/13/2011   Procedure: LEFT HEART CATHETERIZATION WITH CORONARY ANGIOGRAM;  Surgeon: Peter M Martinique, MD;  Location: [facility] CATH LAB;  Service: Cardiovascular;  Laterality: N/A;  . PERCUTANEOUS CORONARY STENT INTERVENTION (PCI-S) Right 10/13/2011   Procedure: PERCUTANEOUS CORONARY STENT INTERVENTION (PCI-S);   Surgeon: Peter M Martinique, MD;  Location: [facility] CATH LAB;  Service: Cardiovascular;  Laterality: Right;  . SHOULDER ARTHROSCOPY W/ ROTATOR CUFF REPAIR Left 1990's  . TRIGGER FINGER RELEASE Left    3rd and 4th digits    Social History   Social History  . Marital status: Married    Spouse name: N/A  . Number of children: N/A  . Years of education: N/A   Social History Main Topics  . Smoking status: Former Smoker    Packs/day: 2.00    Years: 20.00    Types: Cigarettes    Quit date: 11/14/1982  . Smokeless  tobacco: Never Used  . Alcohol use 3.6 oz/week    6 Cans of beer per week  . Drug use: No  . Sexual activity: Not Currently   Other Topics Concern  . Not on file   Social History Narrative   Pt lives with his wife. 1 and a half story home  No issues with stairs. 12th grade education       Family History  Problem Relation Age of Onset  . Cancer Mother     lung  . Heart disease Father     heart attack   . Cancer Father     colon    Review of Systems  Constitutional: Negative for chills and fever.  Respiratory: Positive for shortness of breath (occ, mild). Negative for cough and wheezing.   Cardiovascular: Negative for chest pain, palpitations and leg swelling.  Gastrointestinal: Negative for abdominal pain, blood in stool, constipation and diarrhea.  Neurological: Positive for headaches (migraines). Negative for light-headedness.       Objective:   Vitals:   11/28/16 1257  BP: 122/62  Pulse: 68  Resp: 16  Temp: 97.7 F (36.5 C)   Filed Weights   11/28/16 1257  Weight: 215 lb (97.5 kg)   Body mass index is 28.37 kg/m.  Wt Readings from Last 3 Encounters:  11/28/16 215 lb (97.5 kg)  06/20/16 221 lb (100.2 kg)  01/18/16 216 lb 2 oz (98 kg)     Physical Exam Constitutional: Appears well-developed and well-nourished. No distress.  HENT:  Head: Normocephalic and atraumatic.  Neck: Neck supple. No tracheal deviation present. No thyromegaly present.  No  cervical lymphadenopathy Cardiovascular: Normal rate, regular rhythm and normal heart sounds.   No murmur heard. No carotid bruit .  No edema Pulmonary/Chest: Effort normal and breath sounds normal. No respiratory distress. No has no wheezes. No rales.  Abdomen: soft, non tender ,non distended Skin: Skin is warm and dry. Not diaphoretic.  Psychiatric: Normal mood and affect. Behavior is normal.         Assessment & Plan:   See Problem List for Assessment and Plan of chronic medical problems.  FU annually, sooner if needed

## 2016-11-28 ENCOUNTER — Encounter: Payer: Self-pay | Admitting: Internal Medicine

## 2016-11-28 ENCOUNTER — Ambulatory Visit (INDEPENDENT_AMBULATORY_CARE_PROVIDER_SITE_OTHER): Payer: Medicare HMO | Admitting: Internal Medicine

## 2016-11-28 VITALS — BP 122/62 | HR 68 | Temp 97.7°F | Resp 16 | Ht 73.0 in | Wt 215.0 lb

## 2016-11-28 DIAGNOSIS — I10 Essential (primary) hypertension: Secondary | ICD-10-CM | POA: Diagnosis not present

## 2016-11-28 DIAGNOSIS — M719 Bursopathy, unspecified: Secondary | ICD-10-CM | POA: Insufficient documentation

## 2016-11-28 DIAGNOSIS — M7070 Other bursitis of hip, unspecified hip: Secondary | ICD-10-CM | POA: Insufficient documentation

## 2016-11-28 DIAGNOSIS — R7303 Prediabetes: Secondary | ICD-10-CM

## 2016-11-28 DIAGNOSIS — I2510 Atherosclerotic heart disease of native coronary artery without angina pectoris: Secondary | ICD-10-CM

## 2016-11-28 DIAGNOSIS — G43109 Migraine with aura, not intractable, without status migrainosus: Secondary | ICD-10-CM

## 2016-11-28 DIAGNOSIS — K219 Gastro-esophageal reflux disease without esophagitis: Secondary | ICD-10-CM

## 2016-11-28 DIAGNOSIS — G43909 Migraine, unspecified, not intractable, without status migrainosus: Secondary | ICD-10-CM | POA: Insufficient documentation

## 2016-11-28 NOTE — Patient Instructions (Signed)
   No immunizations administered today.   Medications reviewed and updated.  No changes recommended at this time.    Please schedule with Dr Tamala Julian for your "Cheek bursitis"

## 2016-11-28 NOTE — Assessment & Plan Note (Signed)
BP well controlled Current regimen effective and well tolerated Continue current medications at current doses  

## 2016-11-28 NOTE — Assessment & Plan Note (Signed)
Asymptomatic Sees Dr Martinique soon Continue current medication

## 2016-11-28 NOTE — Assessment & Plan Note (Signed)
a1c monitored through [facility] a low sugar/carb diet

## 2016-11-28 NOTE — Assessment & Plan Note (Signed)
Getting botox through [place] Migraines improved with botox Takes tramadol as needed for migraines

## 2016-11-28 NOTE — Progress Notes (Signed)
Pre visit review using our clinic review tool, if applicable. No additional management support is needed unless otherwise documented below in the visit note. 

## 2016-11-28 NOTE — Assessment & Plan Note (Signed)
GERD controlled Continue daily medication  

## 2016-11-28 NOTE — Assessment & Plan Note (Signed)
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

## 2016-12-08 ENCOUNTER — Telehealth: Payer: Self-pay | Admitting: Internal Medicine

## 2016-12-08 NOTE — Telephone Encounter (Signed)
I would be happy to accept the patient.

## 2016-12-08 NOTE — Telephone Encounter (Signed)
°  Call or walk-in: Call  Who did you speak to [on phone / infront of you]: Margarito Courser  Reason for call:  Patient wants to change providers. I am awaiting a response from you both on approval before scheduling the patient with Dr. Juleen China out of courtesy. Thank you       Duration of expected response [communicated to patient]: As soon as possible   Additional comments:

## 2016-12-08 NOTE — Telephone Encounter (Signed)
Ok with me 

## 2016-12-12 NOTE — Progress Notes (Signed)
[name] [place], [place] [address] Phone: [phone] Subjective:    I'm seeing this patient by the request  of:  Binnie Rail, MD   CC: buttocks pain.  [id number]  Robert [name] is a 71 y.o. male coming in with complaint of buttock pain. Known history of cervical radiculopathy with spinal stenosis and has responded somewhat to epidural injections.  Patient states Having more of a buttocks pain bilaterally. Right greater than left. Seems to stay localized. Worse with sitting for long amount of time. Patient states that he does not know significant amount of pain with activity. 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 Patient denies radiation down the legs any numbness or tingling. Denies any weakness.     Past Medical History:  Diagnosis Date  . CAD (coronary artery disease) 10/10/2011   NSTEMI with DES to the proximal RCA following flow wire evaluation. EF is normal.   . GERD (gastroesophageal reflux disease)   . H/O esophageal ulcer   . History of stomach ulcers   . Hyperlipidemia   . Hypertension   . Migraines    "quite often; maybe q 10d to 2 wk" (01/14/2014)  . Myocardial infarction 10/2011  . OSA on CPAP    "wear it most of the time" (01/14/2014)  . 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 BACK SURGERY    . CERVICAL DISC SURGERY  1980's?  . CORONARY ANGIOPLASTY WITH STENT PLACEMENT  10/13/2011   DES to the proximal RCA. Dr Martinique  . FRACTIONAL FLOW RESERVE WIRE Right 10/13/2011   Procedure: FRACTIONAL FLOW RESERVE WIRE;  Surgeon: Peter M Martinique, MD;  Location: [facility] CATH LAB;  Service: Cardiovascular;  Laterality: Right;  . HERNIA REPAIR    . INGUINAL HERNIA REPAIR  10/23/2012   Procedure: HERNIA REPAIR INGUINAL ADULT;  Surgeon: Odis Hollingshead, MD;  Location: [place];  Service: General;  Laterality: Left;  Left lower quadrant; left inguinal hernia repair with mesh    . INSERTION OF MESH  10/23/2012   Procedure: INSERTION OF MESH;  Surgeon: Odis Hollingshead, MD;  Location: [place];  Service: General;  Laterality: Left;  Left lower quadrant  . LEFT HEART CATHETERIZATION WITH CORONARY ANGIOGRAM N/A 10/13/2011   Procedure: LEFT HEART CATHETERIZATION WITH CORONARY ANGIOGRAM;  Surgeon: Peter M Martinique, MD;  Location: [facility] CATH LAB;  Service: Cardiovascular;  Laterality: N/A;  . PERCUTANEOUS CORONARY STENT INTERVENTION (PCI-S) Right 10/13/2011   Procedure: PERCUTANEOUS CORONARY STENT INTERVENTION (PCI-S);  Surgeon: Peter M Martinique, MD;  Location: [facility] CATH LAB;  Service: Cardiovascular;  Laterality: Right;  . SHOULDER ARTHROSCOPY W/ ROTATOR CUFF REPAIR Left 1990's  . TRIGGER FINGER RELEASE Left    3rd and 4th digits   Social History   Social History  . Marital status: Married    Spouse name: N/A  . Number of children: N/A  . Years of education: N/A   Social History Main Topics  . Smoking status: Former Smoker    Packs/day: 2.00    Years: 20.00    Types: Cigarettes    Quit date: 11/14/1982  . Smokeless tobacco: Never Used  . Alcohol use 3.6 oz/week    6 Cans of beer per week  . Drug use: No  . Sexual activity: Not Currently   Other Topics Concern  . None   Social History Narrative   Pt lives with  his wife. 1 and a half story home  No issues with stairs. 12th grade education      Allergies  Allergen Reactions  . Penicillins     itching  . Sumatriptan     REACTION: sob, headache scalp sensitivity  . Oxycodone Itching   Family History  Problem Relation Age of Onset  . Cancer Mother     lung  . Heart disease Father     heart attack   . Cancer Father     colon    Past medical history, social, surgical and family history all reviewed in electronic medical record.  No pertanent information unless stated regarding to the chief complaint.   Review of Systems:Review of systems updated and as accurate as of 12/13/16  No headache, visual changes,  nausea, vomiting, diarrhea, constipation, dizziness, abdominal pain, skin rash, feve, chest pain, shortness of breath, mood changes.   Objective  Blood pressure 126/64, pulse 63, height 6\' 1"  (1.854 m), weight 214 lb (97.1 kg), SpO2 98 %. Systems examined below as of 12/13/16   General: No apparent distress alert and oriented x3 mood and affect normal, dressed appropriately.  HEENT: Pupils equal, extraocular movements intact  Respiratory: Patient's speak in full sentences and does not appear short of breath  Cardiovascular: No lower extremity edema, non tender, no erythema  Skin: Warm dry intact with no signs of infection or rash on extremities or on axial skeleton.  Abdomen: Soft nontender  Neuro: Cranial nerves II through XII are intact, neurovascularly intact in all extremities with 2+ DTRs and 2+ pulses.  Lymph: No lymphadenopathy of posterior or anterior cervical chain or axillae bilaterally.  Gait normal with good balance and coordination.  MSK:  Non tender with full range of motion and good stability and symmetric strength and tone of shoulders, elbows, wrist, hip, knee and ankles bilaterally. Arthritic changes of multiple joints. Patient's lower back does have significant loss of lordosis. Mild tenderness in the paraspinal musculature. Tight hamstrings bilaterally. Patient is tender to palpation over the ischial bursa bilaterally. Neurovascularly intact distally with good strength that is symmetric.  Limited [place] skeletal ultrasound was performed and interpreted by Lyndal Pulley  Limited ultrasound and patient ischial area does show that patient has significant bursitis on the right side and mild bursitis of the left side. No avulsion tear any tendinopathy noted. Impression: Bilateral ischial bursitis.  Procedure: Real-time Ultrasound Guided Injection of right ischial bursa Device: GE Logiq Q7 Ultrasound guided injection is preferred based studies that show increased duration,  increased effect, greater accuracy, decreased procedural pain, increased response rate, and decreased cost with ultrasound guided versus blind injection.  Verbal informed consent obtained.  Time-out conducted.  Noted no overlying erythema, induration, or other signs of local infection.  Skin prepped in a sterile fashion.  Local anesthesia: Topical Ethyl chloride.  With sterile technique and under real time ultrasound guidance:  With a 21-gauge 3 inch needle patient was injected with a total of 1 mL of 0.5% Marcaine and 1 mL of Kenalog 40 mg/dL into the ischial area. Completed without difficulty  Pain immediately resolved suggesting accurate placement of the medication.  Advised to call if fevers/chills, erythema, induration, drainage, or persistent bleeding.  Images permanently stored and available for review in the ultrasound unit.  Impression: Technically successful ultrasound guided injection.  Procedure: Real-time Ultrasound Guided Injection of left ischial bursa Device: GE Logiq Q7 Ultrasound guided injection is preferred based studies that show increased duration, increased effect, greater accuracy,  decreased procedural pain, increased response rate, and decreased cost with ultrasound guided versus blind injection.  Verbal informed consent obtained.  Time-out conducted.  Noted no overlying erythema, induration, or other signs of local infection.  Skin prepped in a sterile fashion.  Local anesthesia: Topical Ethyl chloride.  With sterile technique and under real time ultrasound guidance:  With a 21-gauge 3 inch needle patient was injected with a total of 1 mL of 0.5% Marcaine and 1 mL of Kenalog 40 mg/dL into the ischial bursa. Completed without difficulty  Pain immediately resolved suggesting accurate placement of the medication.  Advised to call if fevers/chills, erythema, induration, drainage, or persistent bleeding.  Images permanently stored and available for review in the  ultrasound unit.  Impression: Technically successful ultrasound guided injection.   Procedure note [id number]; 15 minutes spent for Therapeutic exercises as stated in above notes.  This included exercises focusing on stretching, strengthening, with significant focus on eccentric aspects.  Hamstring curls: Start with 3 sets of 15 (no weight); Progress by 5 reps every 3 days until you reach 3 sets of 30; After 3 days at 3 sets of 30, add 2lb ankle weight at 3 sets of 10; Increase every 5 days by 5 reps. You may add 2lbs ankle weight once weekly. Hamstring swings- swing leg backwards and curl at the end of the swing. Follow same schedule as above. Hamstring running lunges- running lunge position means no more than 45 degrees of knee flexion and running motion. Follow same schedule as above.  Proper technique shown and discussed handout in great detail with ATC.  All questions were discussed and answered.     Impression and Recommendations:     This case required medical decision making of moderate complexity.      Note: This dictation was prepared with Dragon dictation along with smaller phrase technology. Any transcriptional errors that result from this process are unintentional.

## 2016-12-13 ENCOUNTER — Ambulatory Visit (INDEPENDENT_AMBULATORY_CARE_PROVIDER_SITE_OTHER): Payer: Medicare HMO | Admitting: Family Medicine

## 2016-12-13 ENCOUNTER — Ambulatory Visit: Payer: Self-pay

## 2016-12-13 ENCOUNTER — Encounter: Payer: Self-pay | Admitting: Family Medicine

## 2016-12-13 VITALS — BP 126/64 | HR 63 | Ht 73.0 in | Wt 214.0 lb

## 2016-12-13 DIAGNOSIS — M7070 Other bursitis of hip, unspecified hip: Secondary | ICD-10-CM | POA: Diagnosis not present

## 2016-12-13 NOTE — Patient Instructions (Signed)
Good to see you  Ice 20 minutes 2 times daily. Usually after activity and before bed. pennsaid pinkie amount topically 2 times daily as needed.  Exercises 3 times a week.  Overall I think you will do well but if not better we will need to look at your back  See me again in 3 weeks.

## 2016-12-13 NOTE — Assessment & Plan Note (Signed)
Patient was given bilateral injections today. Home exercises given by athletic trainer. We discussed icing regimen and home exercises. We discussed which activities to do in which ones to avoid. We discussed avoiding sitting for longer that time if possible. Topical anti-inflammatories given. Follow-up again in 3 weeks.

## 2016-12-16 ENCOUNTER — Ambulatory Visit (INDEPENDENT_AMBULATORY_CARE_PROVIDER_SITE_OTHER): Payer: Medicare HMO | Admitting: Family Medicine

## 2016-12-16 ENCOUNTER — Encounter: Payer: Self-pay | Admitting: Family Medicine

## 2016-12-16 VITALS — BP 122/64 | HR 60 | Temp 97.8°F | Ht 73.0 in | Wt 212.4 lb

## 2016-12-16 DIAGNOSIS — R0982 Postnasal drip: Secondary | ICD-10-CM | POA: Diagnosis not present

## 2016-12-16 DIAGNOSIS — J0100 Acute maxillary sinusitis, unspecified: Secondary | ICD-10-CM

## 2016-12-16 DIAGNOSIS — Z6828 Body mass index (BMI) 28.0-28.9, adult: Secondary | ICD-10-CM | POA: Diagnosis not present

## 2016-12-16 MED ORDER — DOXYCYCLINE HYCLATE 100 MG PO CAPS: 100.0000 mg | ORAL_CAPSULE | Freq: Two times a day (BID) | ORAL | 0 refills | Status: DC

## 2016-12-16 NOTE — Progress Notes (Signed)
Pre visit review using our clinic review tool, if applicable. No additional management support is needed unless otherwise documented below in the visit note. 

## 2016-12-17 ENCOUNTER — Encounter: Payer: Self-pay | Admitting: Family Medicine

## 2016-12-17 ENCOUNTER — Other Ambulatory Visit: Payer: Self-pay | Admitting: Family Medicine

## 2016-12-17 DIAGNOSIS — IMO0002 Reserved for concepts with insufficient information to code with codable children: Secondary | ICD-10-CM

## 2016-12-17 DIAGNOSIS — Z6828 Body mass index (BMI) 28.0-28.9, adult: Secondary | ICD-10-CM | POA: Insufficient documentation

## 2016-12-17 HISTORY — DX: Reserved for concepts with insufficient information to code with codable children: IMO0002

## 2016-12-17 NOTE — Progress Notes (Signed)
History of Present Illness:   Robert [name] is a 71 y.o. male who presents for evaluation of possible sinus infection. Symptoms include achiness, cough described as dry, post nasal drip, purulent nasal discharge, sinus pressure and fatigue with no fever, chills, night sweats or weight loss. Onset of symptoms was 1 week ago, gradually worsening since that time.  He is drinking plenty of fluids. Patient is a non-smoker.  PMHx, SurgHx, SocialHx, Medications, and Allergies were reviewed in the Visit Navigator and updated as appropriate.   Past Medical History:   Past Medical History:  Diagnosis Date  . CAD (coronary artery disease) 10/10/2011   NSTEMI with DES to the proximal RCA following flow wire evaluation. EF is normal.   . GERD (gastroesophageal reflux disease)   . H/O esophageal ulcer   . History of stomach ulcers   . Hyperlipidemia   . Hypertension   . Migraines    "quite often; maybe q 10d to 2 wk" (01/14/2014)  . Myocardial infarction 10/2011  . OSA on CPAP    "wear it most of the time" (01/14/2014)  . 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 BACK SURGERY    . CERVICAL DISC SURGERY  1980's?  . CORONARY ANGIOPLASTY WITH STENT PLACEMENT  10/13/2011   DES to the proximal RCA. Dr Martinique  . FRACTIONAL FLOW RESERVE WIRE Right 10/13/2011   Procedure: FRACTIONAL FLOW RESERVE WIRE;  Surgeon: Peter M Martinique, MD;  Location: [facility] CATH LAB;  Service: Cardiovascular;  Laterality: Right;  . HERNIA REPAIR    . INGUINAL HERNIA REPAIR  10/23/2012   Procedure: HERNIA REPAIR INGUINAL ADULT;  Surgeon: Odis Hollingshead, MD;  Location: [place];  Service: General;  Laterality: Left;  Left lower quadrant; left inguinal hernia repair with mesh  . INSERTION OF MESH  10/23/2012   Procedure: INSERTION OF MESH;  Surgeon: Odis Hollingshead, MD;  Location: [place];  Service: General;  Laterality: Left;  Left lower  quadrant  . LEFT HEART CATHETERIZATION WITH CORONARY ANGIOGRAM N/A 10/13/2011   Procedure: LEFT HEART CATHETERIZATION WITH CORONARY ANGIOGRAM;  Surgeon: Peter M Martinique, MD;  Location: [facility] CATH LAB;  Service: Cardiovascular;  Laterality: N/A;  . PERCUTANEOUS CORONARY STENT INTERVENTION (PCI-S) Right 10/13/2011   Procedure: PERCUTANEOUS CORONARY STENT INTERVENTION (PCI-S);  Surgeon: Peter M Martinique, MD;  Location: [facility] CATH LAB;  Service: Cardiovascular;  Laterality: Right;  . SHOULDER ARTHROSCOPY W/ ROTATOR CUFF REPAIR Left 1990's  . TRIGGER FINGER RELEASE Left    3rd and 4th digits     Allergies:   Allergies  Allergen Reactions  . Penicillins     itching  . Sumatriptan     REACTION: sob, headache scalp sensitivity  . Oxycodone Itching     Current Medications:   Prior to Admission medications   Medication Sig Start Date End Date Taking? Authorizing Provider  aspirin EC 81 MG tablet Take 81 mg by mouth daily.   Yes Historical Provider, MD  Cholecalciferol (VITAMIN D) 2000 UNITS CAPS Take 1 capsule by mouth daily.   Yes Historical Provider, MD  cyclobenzaprine (FLEXERIL) 10 MG tablet Take 0.5-1 tablets (5-10 mg total) by mouth 3 (three) times daily as needed for muscle spasms. 06/20/16  Yes Golden Circle, FNP  diclofenac (VOLTAREN) 50 MG EC tablet Take 50 mg by mouth 2 (two) times daily.   Yes Historical Provider,  MD  ezetimibe (ZETIA) 10 MG tablet Take 5 mg by mouth daily.   Yes Historical Provider, MD  fish oil-omega-3 fatty acids 1000 MG capsule Take 1 g by mouth daily.   Yes Historical Provider, MD  levocetirizine (XYZAL) 5 MG tablet Take 5 mg by mouth every evening.  06/08/11  Yes Historical Provider, MD  meclizine (ANTIVERT) 12.5 MG tablet Take 1 tablet (12.5 mg total) by mouth 3 (three) times daily as needed for dizziness. 07/05/14  Yes Lucille Passy, MD  metoprolol tartrate (LOPRESSOR) 25 MG tablet Take 12.5 mg by mouth daily. 10/26/11  Yes Burtis Junes, NP  mometasone (NASONEX)  50 MCG/ACT nasal spray Place 2 sprays into the nose daily. Reported on 01/18/2016   Yes Historical Provider, MD  Multiple Vitamins-Minerals (MULTIVITAMINS THER. W/MINERALS) TABS Take 1 tablet by mouth daily.   Yes Historical Provider, MD  nitroGLYCERIN (NITROSTAT) 0.4 MG SL tablet Place 1 tablet (0.4 mg total) under the tongue every 5 (five) minutes as needed for chest pain. MAX 3 doses 01/28/16  Yes Peter M Martinique, MD  pantoprazole (PROTONIX) 40 MG tablet Take 1 tablet (40 mg total) by mouth daily. 10/26/11  Yes Burtis Junes, NP  rosuvastatin (CRESTOR) 40 MG tablet Take 0.5 tablets (20 mg total) by mouth daily. 02/13/14  Yes Peter M Martinique, MD  traMADol (ULTRAM) 50 MG tablet Take 50 mg by mouth every 6 (six) hours as needed. For pain   Yes Historical Provider, MD  verapamil (CALAN) 40 MG tablet Take 1 tablet (40 mg total) by mouth 3 (three) times daily. 03/27/14  Yes Hendricks Limes, MD  vitamin C (ASCORBIC ACID) 500 MG tablet Take 500 mg by mouth daily.   Yes Historical Provider, MD  doxycycline (VIBRAMYCIN) 100 MG capsule Take 1 capsule (100 mg total) by mouth 2 (two) times daily. 12/16/16   Briscoe Deutscher, DO     Social History:   Social History  Substance Use Topics  . Smoking status: Former Smoker    Packs/day: 2.00    Years: 20.00    Types: Cigarettes    Quit date: 11/14/1982  . Smokeless tobacco: Never Used  . Alcohol use 3.6 oz/week    6 Cans of beer per week    Review of Systems:   No unusual headaches, no dizziness. No dyspnea or chest pain on exertion. No abdominal pain, change in bowel habits, black or bloody stools.  No urinary tract symptoms. No new or unusual musculoskeletal symptoms. No edema.  Vitals:   Vitals:   12/16/16 0901  BP: 122/64  Pulse: 60  Temp: 97.8 F (36.6 C)  TempSrc: Oral  SpO2: 97%  Weight: 212 lb 6.4 oz (96.3 kg)  Height: 6\' 1"  (1.854 m)     Body mass index is 28.02 kg/m.  Physical Exam:   General: alert, cooperative, in NAD Eyes: PERRL,  EOMI, conjunctiva clear HENT: oropharynx clear, moist mucous membranes, posterior pharyngeal erythema, PND, bilateral maxillary and frontal sinus ttp Cardiovascular: regular rate and rhythm Respiratory: CTAB Abdomen: soft, non-tender; bowel sounds normal; no masses,  no organomegaly Extremities: extremities normal, atraumatic, no cyanosis or edema Pulses: 2+ and symmetric Skin: Skin color, texture, turgor normal. No rashes or lesions  Assessment and Plan:    Robert [name] was seen today for establish care.  Diagnoses and all orders for this visit:  PND (post-nasal drip) Acute non-recurrent maxillary sinusitis -     doxycycline (VIBRAMYCIN) 100 MG capsule; Take 1 capsule (100 mg total) by  mouth 2 (two) times daily.   . Reviewed expectations re: course of current medical issues. . Discussed self-management of symptoms. . Outlined signs and symptoms indicating need for more acute intervention. . Patient verbalized understanding and all questions were answered. . See orders for this visit as documented in the electronic medical record. . Patient received an After Visit Summary.  Briscoe Deutscher, D.O.

## 2017-01-02 NOTE — Progress Notes (Signed)
[name] [place], [place] [address] Phone: [phone] Subjective:    I'm seeing this patient by the request  of:  Briscoe Deutscher, DO   CC: buttocks pain f/u  [id number]  Robert [name] is a 71 y.o. male coming in with complaint of buttock pain. Found to have bilateral ischial bursitis. Patient was seen 3 weeks ago and was given an injection. Patient was to increase activities. Patient states 90-95% better. Feels acute is doing significantly better and able to drive longer distances. Patient denies any radiation down the leg. Denies any associated back pain. No bowel or bladder incontinence.     Past Medical History:  Diagnosis Date  . CAD (coronary artery disease) 10/10/2011   NSTEMI with DES to the proximal RCA following flow wire evaluation. EF is normal.   . GERD (gastroesophageal reflux disease)   . H/O esophageal ulcer   . History of stomach ulcers   . Hyperlipidemia   . Hypertension   . Migraines    "quite often; maybe q 10d to 2 wk" (01/14/2014)  . Myocardial infarction 10/2011  . OSA on CPAP    "wear it most of the time" (01/14/2014)  . 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 BACK SURGERY    . CERVICAL DISC SURGERY  1980's?  . CORONARY ANGIOPLASTY WITH STENT PLACEMENT  10/13/2011   DES to the proximal RCA. Dr Martinique  . FRACTIONAL FLOW RESERVE WIRE Right 10/13/2011   Procedure: FRACTIONAL FLOW RESERVE WIRE;  Surgeon: Peter M Martinique, MD;  Location: [facility] CATH LAB;  Service: Cardiovascular;  Laterality: Right;  . HERNIA REPAIR    . INGUINAL HERNIA REPAIR  10/23/2012   Procedure: HERNIA REPAIR INGUINAL ADULT;  Surgeon: Odis Hollingshead, MD;  Location: [place];  Service: General;  Laterality: Left;  Left lower quadrant; left inguinal hernia repair with mesh  . INSERTION OF MESH  10/23/2012   Procedure: INSERTION OF MESH;  Surgeon: Odis Hollingshead, MD;  Location:  [place];  Service: General;  Laterality: Left;  Left lower quadrant  . LEFT HEART CATHETERIZATION WITH CORONARY ANGIOGRAM N/A 10/13/2011   Procedure: LEFT HEART CATHETERIZATION WITH CORONARY ANGIOGRAM;  Surgeon: Peter M Martinique, MD;  Location: [facility] CATH LAB;  Service: Cardiovascular;  Laterality: N/A;  . PERCUTANEOUS CORONARY STENT INTERVENTION (PCI-S) Right 10/13/2011   Procedure: PERCUTANEOUS CORONARY STENT INTERVENTION (PCI-S);  Surgeon: Peter M Martinique, MD;  Location: [facility] CATH LAB;  Service: Cardiovascular;  Laterality: Right;  . SHOULDER ARTHROSCOPY W/ ROTATOR CUFF REPAIR Left 1990's  . TRIGGER FINGER RELEASE Left    3rd and 4th digits   Social History   Social History  . Marital status: Married    Spouse name: N/A  . Number of children: N/A  . Years of education: N/A   Social History Main Topics  . Smoking status: Former Smoker    Packs/day: 2.00    Years: 20.00    Types: Cigarettes    Quit date: 11/14/1982  . Smokeless tobacco: Never Used  . Alcohol use 3.6 oz/week    6 Cans of beer per week  . Drug use: No  . Sexual activity: Not Currently   Other Topics Concern  . None   Social History Narrative   Pt lives with his wife. 1 and a half story home  No issues with stairs. 12th grade education  Allergies  Allergen Reactions  . Penicillins     itching  . Sumatriptan     REACTION: sob, headache scalp sensitivity  . Oxycodone Itching   Family History  Problem Relation Age of Onset  . Cancer Mother     lung  . Heart disease Father     heart attack   . Cancer Father     colon    Past medical history, social, surgical and family history all reviewed in electronic medical record.  No pertanent information unless stated regarding to the chief complaint.   Review of Systems: No headache, visual changes, nausea, vomiting, diarrhea, constipation, dizziness, abdominal pain, skin rash, fevers, chills, night sweats, weight loss, swollen lymph nodes, body aches, joint  swelling, muscle aches, chest pain, shortness of breath, mood changes.    Objective  Blood pressure 112/72, pulse 62, height 6\' 1"  (1.854 m), weight 212 lb (96.2 kg), SpO2 98 %. Systems examined below as of 01/03/17   General: No apparent distress alert and oriented x3 mood and affect normal, dressed appropriately.  HEENT: Pupils equal, extraocular movements intact  Respiratory: Patient's speak in full sentences and does not appear short of breath  Cardiovascular: No lower extremity edema, non tender, no erythema  Skin: Warm dry intact with no signs of infection or rash on extremities or on axial skeleton.  Abdomen: Soft nontender  Neuro: Cranial nerves II through XII are intact, neurovascularly intact in all extremities with 2+ DTRs and 2+ pulses.  Lymph: No lymphadenopathy of posterior or anterior cervical chain or axillae bilaterally.  Gait normal with good balance and coordination.  MSK:  Non tender with full range of motion and good stability and symmetric strength and tone of shoulders, elbows, wrist, hip, knee and ankles bilaterally. Arthritic changes of multiple joints. Patient's lower back does have significant loss of lordosis. \ Still some mild tightness of the hamstring. In significantly less tender over the ischial area.      Impression and Recommendations:     This case required medical decision making of moderate complexity.      Note: This dictation was prepared with Dragon dictation along with smaller phrase technology. Any transcriptional errors that result from this process are unintentional.

## 2017-01-03 ENCOUNTER — Ambulatory Visit (INDEPENDENT_AMBULATORY_CARE_PROVIDER_SITE_OTHER): Payer: Medicare HMO | Admitting: Family Medicine

## 2017-01-03 ENCOUNTER — Encounter: Payer: Self-pay | Admitting: Family Medicine

## 2017-01-03 DIAGNOSIS — M7070 Other bursitis of hip, unspecified hip: Secondary | ICD-10-CM | POA: Diagnosis not present

## 2017-01-03 NOTE — Assessment & Plan Note (Signed)
Doing much better overall.  RTC as needed

## 2017-01-03 NOTE — Patient Instructions (Signed)
Good to see you  I am glad you are doing better  Ice is your friend.  Consider driving with a towel or jacket rolled up beneath knees See me again in 8 weeks if you need me.

## 2017-01-12 ENCOUNTER — Encounter: Payer: Self-pay | Admitting: Cardiology

## 2017-01-15 NOTE — Progress Notes (Signed)
Robert [name] Date of Birth: [birth year] Medical Record [id number]  History of Present Illness: Robert [name] is seen today for follow up CAD.  He has had a  NSTEMI with DES to the proximal RCA in November 2012. EF was normal.  Stress Myoview in March 2015 was Normal. 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  He has no recurrent chest pain. He remains  active. He has a [place] Shepherd "Tootsie" .He denies any chest pain, SOB, or palpitations. No other new medical problems.  Current Outpatient Prescriptions on File Prior to Visit  Medication Sig Dispense Refill  . aspirin EC 81 MG tablet Take 81 mg by mouth daily.    . Cholecalciferol (VITAMIN D) 2000 UNITS CAPS Take 1 capsule by mouth daily.    . cyclobenzaprine (FLEXERIL) 10 MG tablet Take 0.5-1 tablets (5-10 mg total) by mouth 3 (three) times daily as needed for muscle spasms. 30 tablet 0  . diclofenac (VOLTAREN) 50 MG EC tablet Take 50 mg by mouth 2 (two) times daily.    Marland Kitchen doxycycline (VIBRAMYCIN) 100 MG capsule Take 1 capsule (100 mg total) by mouth 2 (two) times daily. 14 capsule 0  . ezetimibe (ZETIA) 10 MG tablet Take 5 mg by mouth daily.    . fish oil-omega-3 fatty acids 1000 MG capsule Take 1 g by mouth daily.    Marland Kitchen levocetirizine (XYZAL) 5 MG tablet Take 5 mg by mouth every evening.     . meclizine (ANTIVERT) 12.5 MG tablet Take 1 tablet (12.5 mg total) by mouth 3 (three) times daily as needed for dizziness. 30 tablet 0  . metoprolol tartrate (LOPRESSOR) 25 MG tablet Take 12.5 mg by mouth daily.    . mometasone (NASONEX) 50 MCG/ACT nasal spray Place 2 sprays into the nose daily. Reported on 01/18/2016    . Multiple Vitamins-Minerals (MULTIVITAMINS THER. W/MINERALS) TABS Take 1 tablet by mouth daily.    . nitroGLYCERIN (NITROSTAT) 0.4 MG SL tablet Place 1 tablet (0.4 mg total) under the tongue every 5 (five) minutes as needed for chest pain. MAX 3 doses 25 tablet 3  . pantoprazole (PROTONIX)  40 MG tablet Take 1 tablet (40 mg total) by mouth daily. 90 tablet 3  . rosuvastatin (CRESTOR) 40 MG tablet Take 0.5 tablets (20 mg total) by mouth daily. 90 tablet 3  . traMADol (ULTRAM) 50 MG tablet Take 50 mg by mouth every 6 (six) hours as needed. For pain    . verapamil (CALAN) 40 MG tablet Take 1 tablet (40 mg total) by mouth 3 (three) times daily. 45 tablet 0  . vitamin C (ASCORBIC ACID) 500 MG tablet Take 500 mg by mouth daily.     No current facility-administered medications on file prior to visit.     Allergies  Allergen Reactions  . Penicillins     itching  . Sumatriptan     REACTION: sob, headache scalp sensitivity  . Oxycodone Itching    Past Medical History:  Diagnosis Date  . CAD (coronary artery disease) 10/10/2011   NSTEMI with DES to the proximal RCA following flow wire evaluation. EF is normal.   . GERD (gastroesophageal reflux disease)   . H/O esophageal ulcer   . History of stomach ulcers   . Hyperlipidemia   . Hypertension   . Migraines    "quite often; maybe q 10d to 2 wk" (01/14/2014)  . Myocardial infarction 10/2011  . OSA on CPAP    "wear  it most of the time" (01/14/2014)  . 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 BACK SURGERY    . CERVICAL DISC SURGERY  1980's?  . CORONARY ANGIOPLASTY WITH STENT PLACEMENT  10/13/2011   DES to the proximal RCA. Dr Martinique  . FRACTIONAL FLOW RESERVE WIRE Right 10/13/2011   Procedure: FRACTIONAL FLOW RESERVE WIRE;  Surgeon: Peter M Martinique, MD;  Location: [facility] CATH LAB;  Service: Cardiovascular;  Laterality: Right;  . HERNIA REPAIR    . INGUINAL HERNIA REPAIR  10/23/2012   Procedure: HERNIA REPAIR INGUINAL ADULT;  Surgeon: Odis Hollingshead, MD;  Location: [place];  Service: General;  Laterality: Left;  Left lower quadrant; left inguinal hernia repair with mesh  . INSERTION OF MESH  10/23/2012   Procedure: INSERTION OF MESH;  Surgeon: Odis Hollingshead, MD;   Location: [place];  Service: General;  Laterality: Left;  Left lower quadrant  . LEFT HEART CATHETERIZATION WITH CORONARY ANGIOGRAM N/A 10/13/2011   Procedure: LEFT HEART CATHETERIZATION WITH CORONARY ANGIOGRAM;  Surgeon: Peter M Martinique, MD;  Location: [facility] CATH LAB;  Service: Cardiovascular;  Laterality: N/A;  . PERCUTANEOUS CORONARY STENT INTERVENTION (PCI-S) Right 10/13/2011   Procedure: PERCUTANEOUS CORONARY STENT INTERVENTION (PCI-S);  Surgeon: Peter M Martinique, MD;  Location: [facility] CATH LAB;  Service: Cardiovascular;  Laterality: Right;  . SHOULDER ARTHROSCOPY W/ ROTATOR CUFF REPAIR Left 1990's  . TRIGGER FINGER RELEASE Left    3rd and 4th digits    History  Smoking Status  . Former Smoker  . Packs/day: 2.00  . Years: 20.00  . Types: Cigarettes  . Quit date: 11/14/1982  Smokeless Tobacco  . Never Used    History  Alcohol Use  . 3.6 oz/week  . 6 Cans of beer per week    Family History  Problem Relation Age of Onset  . Cancer Mother     lung  . Heart disease Father     heart attack   . Cancer Father     colon    Review of Systems: The review of systems is per the HPI.  All other systems were reviewed and are negative.  Physical Exam: BP 126/76   Pulse (!) 53   Ht 6\' 1"  (1.854 m)   Wt 211 lb 2 oz (95.8 kg)   BMI 27.85 kg/m  Patient is  pleasant and in no acute distress. Skin is warm and dry. Color is normal.  HEENT is unremarkable. Normocephalic/atraumatic. PERRL. Sclera are nonicteric. Neck is supple. No masses. No JVD. Lungs are clear. Cardiac exam shows a regular rate and rhythm.normal S1 and S2 without gallop or murmur. Abdomen is soft. No masses or bruits.Extremities are without edema.pedal pulses are good. Gait and ROM are intact. No gross neurologic deficits noted.   LABORATORY DATA:  Lab Results  Component Value Date   WBC 6.3 05/06/2015   HGB 14.3 05/06/2015   HCT 42.2 05/06/2015   PLT 201.0 05/06/2015   GLUCOSE 107 (H) 01/18/2016   CHOL 175 01/18/2016    TRIG 123 01/18/2016   HDL 65 01/18/2016   LDLDIRECT 137.0 03/31/2010   LDLCALC 85 01/18/2016   ALT 23 01/18/2016   AST 25 01/18/2016   NA 139 01/18/2016   K 4.8 01/18/2016   CL 100 01/18/2016   CREATININE 0.94 01/18/2016   BUN 15 01/18/2016   CO2 27 01/18/2016   TSH 1.70 07/05/2014   PSA  0.87 03/31/2010   INR 0.89 01/14/2014   HGBA1C 5.9 (H) 10/21/2011   MICROALBUR 0.50 10/21/2011    Ecg: today- NSR rate 53. Normal Ecg.  I have personally reviewed and interpreted this study.   Assessment / Plan: 1. Coronary artery disease status post drug-eluting stent to the proximal RCA in November 2012. Myoview March 2015 was normal. Asymptomatic. Continue ASA, statin, metoprolol. Follow up in one year.  2. Hyperlipidemia. Continue crestor and Zetia- will check fasting lab work today.  3. Hypertension well controlled.

## 2017-01-18 ENCOUNTER — Encounter: Payer: Self-pay | Admitting: Cardiology

## 2017-01-18 ENCOUNTER — Ambulatory Visit (INDEPENDENT_AMBULATORY_CARE_PROVIDER_SITE_OTHER): Payer: Medicare HMO | Admitting: Cardiology

## 2017-01-18 VITALS — BP 126/76 | HR 53 | Ht 73.0 in | Wt 211.1 lb

## 2017-01-18 DIAGNOSIS — I2510 Atherosclerotic heart disease of native coronary artery without angina pectoris: Secondary | ICD-10-CM | POA: Diagnosis not present

## 2017-01-18 DIAGNOSIS — E782 Mixed hyperlipidemia: Secondary | ICD-10-CM | POA: Diagnosis not present

## 2017-01-18 DIAGNOSIS — I10 Essential (primary) hypertension: Secondary | ICD-10-CM

## 2017-01-18 LAB — HEPATIC FUNCTION PANEL
ALT: 28 U/L (ref 9–46)
AST: 23 U/L (ref 10–35)
Albumin: 4.2 g/dL (ref 3.6–5.1)
Alkaline Phosphatase: 50 U/L (ref 40–115)
BILIRUBIN INDIRECT: 0.3 mg/dL (ref 0.2–1.2)
Bilirubin, Direct: 0.1 mg/dL (ref <=0.2)
TOTAL PROTEIN: 6.8 g/dL (ref 6.1–8.1)
Total Bilirubin: 0.4 mg/dL (ref 0.2–1.2)

## 2017-01-18 LAB — LIPID PANEL
Cholesterol: 197 mg/dL (ref <200)
HDL: 76 mg/dL (ref >40)
LDL CALC: 96 mg/dL (ref <100)
TRIGLYCERIDES: 123 mg/dL (ref <150)
Total CHOL/HDL Ratio: 2.6 Ratio (ref <5.0)
VLDL: 25 mg/dL (ref <30)

## 2017-01-18 LAB — BASIC METABOLIC PANEL
BUN: 12 mg/dL (ref 7–25)
CHLORIDE: 100 mmol/L (ref 98–110)
CO2: 32 mmol/L — ABNORMAL HIGH (ref 20–31)
Calcium: 9.4 mg/dL (ref 8.6–10.3)
Creat: 1.08 mg/dL (ref 0.70–1.18)
Glucose, Bld: 106 mg/dL — ABNORMAL HIGH (ref 65–99)
POTASSIUM: 5.2 mmol/L (ref 3.5–5.3)
Sodium: 139 mmol/L (ref 135–146)

## 2017-01-18 NOTE — Patient Instructions (Signed)
We will check blood work today  Continue your current therapy  I will see you in one year

## 2017-01-19 DIAGNOSIS — L570 Actinic keratosis: Secondary | ICD-10-CM | POA: Diagnosis not present

## 2017-01-19 DIAGNOSIS — C44622 Squamous cell carcinoma of skin of right upper limb, including shoulder: Secondary | ICD-10-CM | POA: Diagnosis not present

## 2017-01-19 DIAGNOSIS — D485 Neoplasm of uncertain behavior of skin: Secondary | ICD-10-CM | POA: Diagnosis not present

## 2017-01-19 DIAGNOSIS — Z85828 Personal history of other malignant neoplasm of skin: Secondary | ICD-10-CM | POA: Diagnosis not present

## 2017-01-19 DIAGNOSIS — L853 Xerosis cutis: Secondary | ICD-10-CM | POA: Diagnosis not present

## 2017-01-27 ENCOUNTER — Other Ambulatory Visit: Payer: Self-pay

## 2017-01-27 DIAGNOSIS — E782 Mixed hyperlipidemia: Secondary | ICD-10-CM

## 2017-03-01 ENCOUNTER — Ambulatory Visit (INDEPENDENT_AMBULATORY_CARE_PROVIDER_SITE_OTHER)
Admission: RE | Admit: 2017-03-01 | Discharge: 2017-03-01 | Disposition: A | Discharge status: Home / Self Care | Payer: Medicare HMO | Source: Ambulatory Visit | Attending: Family Medicine | Admitting: Family Medicine

## 2017-03-01 ENCOUNTER — Ambulatory Visit (INDEPENDENT_AMBULATORY_CARE_PROVIDER_SITE_OTHER): Payer: Medicare HMO | Admitting: Family Medicine

## 2017-03-01 ENCOUNTER — Encounter: Payer: Self-pay | Admitting: Family Medicine

## 2017-03-01 ENCOUNTER — Ambulatory Visit: Payer: Self-pay

## 2017-03-01 VITALS — BP 142/72 | HR 71 | Resp 16 | Wt 216.0 lb

## 2017-03-01 DIAGNOSIS — M5441 Lumbago with sciatica, right side: Principal | ICD-10-CM

## 2017-03-01 DIAGNOSIS — M5442 Lumbago with sciatica, left side: Principal | ICD-10-CM

## 2017-03-01 DIAGNOSIS — M7070 Other bursitis of hip, unspecified hip: Secondary | ICD-10-CM | POA: Diagnosis not present

## 2017-03-01 DIAGNOSIS — M7071 Other bursitis of hip, right hip: Secondary | ICD-10-CM | POA: Diagnosis not present

## 2017-03-01 DIAGNOSIS — M7072 Other bursitis of hip, left hip: Secondary | ICD-10-CM

## 2017-03-01 DIAGNOSIS — G8929 Other chronic pain: Secondary | ICD-10-CM

## 2017-03-01 DIAGNOSIS — M545 Low back pain: Secondary | ICD-10-CM | POA: Diagnosis not present

## 2017-03-01 MED ORDER — GABAPENTIN 100 MG PO CAPS: 200.0000 mg | ORAL_CAPSULE | Freq: Every day | ORAL | 3 refills | Status: DC

## 2017-03-01 NOTE — Assessment & Plan Note (Signed)
Worsening symptoms. Patient given bilateral injections today. Tolerated the procedure well. We discussed to regimen and home exercises. Patient does not have any significant improvement possible lumbar radiculopathy. X-rays ordered today. Gabapentin given as well. Patient come back and see me again in 4 weeks.

## 2017-03-01 NOTE — Progress Notes (Signed)
[name] [name], [place] [address] Phone: [phone] Subjective:    I'm seeing this patient by the request  of:  Briscoe Deutscher, DO   CC: buttocks pain f/u  [id number]  Robert [name] is a 71 y.o. male coming in with complaint of buttock pain. Found to have bilateral ischial bursitis.Patient was given an injection 3 months ago. Patient was doing significantly better after injections in the ischial bursa area. Patient has to continue to be active. Patient states pain is starting to come back again. Patient states sitting a long amount of time causes more discomfort. Patient is wanting to be more active and watch his weight but finds it difficult to increase activity due to the soreness that he has. Some mild radiation going down the line. No significant back pain that seems to be associated with it. Seems very similar to previous presentation just worsening symptoms.     Past Medical History:  Diagnosis Date  . CAD (coronary artery disease) 10/10/2011   NSTEMI with DES to the proximal RCA following flow wire evaluation. EF is normal.   . GERD (gastroesophageal reflux disease)   . H/O esophageal ulcer   . History of stomach ulcers   . Hyperlipidemia   . Hypertension   . Migraines    "quite often; maybe q 10d to 2 wk" (01/14/2014)  . Myocardial infarction ([place]) 10/2011  . OSA on CPAP    "wear it most of the time" (01/14/2014)  . 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 BACK SURGERY    . CERVICAL DISC SURGERY  1980's?  . CORONARY ANGIOPLASTY WITH STENT PLACEMENT  10/13/2011   DES to the proximal RCA. Dr Martinique  . FRACTIONAL FLOW RESERVE WIRE Right 10/13/2011   Procedure: FRACTIONAL FLOW RESERVE WIRE;  Surgeon: Peter M Martinique, MD;  Location: [facility] CATH LAB;  Service: Cardiovascular;  Laterality: Right;  . HERNIA REPAIR    . INGUINAL HERNIA REPAIR  10/23/2012   Procedure:  HERNIA REPAIR INGUINAL ADULT;  Surgeon: Odis Hollingshead, MD;  Location: [place];  Service: General;  Laterality: Left;  Left lower quadrant; left inguinal hernia repair with mesh  . INSERTION OF MESH  10/23/2012   Procedure: INSERTION OF MESH;  Surgeon: Odis Hollingshead, MD;  Location: [place];  Service: General;  Laterality: Left;  Left lower quadrant  . LEFT HEART CATHETERIZATION WITH CORONARY ANGIOGRAM N/A 10/13/2011   Procedure: LEFT HEART CATHETERIZATION WITH CORONARY ANGIOGRAM;  Surgeon: Peter M Martinique, MD;  Location: [facility] CATH LAB;  Service: Cardiovascular;  Laterality: N/A;  . PERCUTANEOUS CORONARY STENT INTERVENTION (PCI-S) Right 10/13/2011   Procedure: PERCUTANEOUS CORONARY STENT INTERVENTION (PCI-S);  Surgeon: Peter M Martinique, MD;  Location: [facility] CATH LAB;  Service: Cardiovascular;  Laterality: Right;  . SHOULDER ARTHROSCOPY W/ ROTATOR CUFF REPAIR Left 1990's  . TRIGGER FINGER RELEASE Left    3rd and 4th digits   Social History   Social History  . Marital status: Married    Spouse name: N/A  . Number of children: N/A  . Years of education: N/A   Social History Main Topics  . Smoking status: Former Smoker    Packs/day: 2.00    Years: 20.00    Types: Cigarettes    Quit date: 11/14/1982  . Smokeless tobacco: Never Used  . Alcohol use 3.6 oz/week    6 Cans of beer  per week  . Drug use: No  . Sexual activity: Not Currently   Other Topics Concern  . None   Social History Narrative   Pt lives with his wife. 1 and a half story home  No issues with stairs. 12th grade education      Allergies  Allergen Reactions  . Penicillins     itching  . Sumatriptan     REACTION: sob, headache scalp sensitivity  . Oxycodone Itching   Family History  Problem Relation Age of Onset  . Cancer Mother     lung  . Heart disease Father     heart attack   . Cancer Father     colon    Past medical history, social, surgical and family history all reviewed in electronic medical record.  No  pertanent information unless stated regarding to the chief complaint.   Review of Systems: No headache, visual changes, nausea, vomiting, diarrhea, constipation, dizziness, abdominal pain, skin rash, fevers, chills, night sweats, weight loss, swollen lymph nodes, body aches, joint swelling,  chest pain, shortness of breath, mood changes.  Positive muscle aches  Objective  Blood pressure (!) 142/72, pulse 71, resp. rate 16, weight 216 lb (98 kg), SpO2 97 %. Systems examined below as of 03/01/17   Systems examined below as of 03/01/17 General: NAD A&O x3 mood, affect normal  HEENT: Pupils equal, extraocular movements intact no nystagmus Respiratory: not short of breath at rest or with speaking Cardiovascular: No lower extremity edema, non tender Skin: Warm dry intact with no signs of infection or rash on extremities or on axial skeleton. Abdomen: Soft nontender, no masses Neuro: Cranial nerves  intact, neurovascularly intact in all extremities with 2+ DTRs and 2+ pulses. Lymph: No lymphadenopathy appreciated today  Gait normal with good balance and coordination.  MSK:  Non tender with full range of motion and good stability and symmetric strength and tone of shoulders, elbows, wrist, hip, knee and ankles bilaterally. Arthritic changes of multiple joints. Patient has increasing tightness of the hamstrings bilaterally. Possible positive straight leg test on the left sign. Patient does have tightness of the Fabere test bilaterally. Very minimal pain over the lumbosacral area bilaterally in the paraspinal musculature. Severe tenderness over the ischial bursa bilaterally.  Procedure: Real-time Ultrasound Guided Injection of left ischial bursa Device: GE Logiq Q7 Ultrasound guided injection is preferred based studies that show increased duration, increased effect, greater accuracy, decreased procedural pain, increased response rate, and decreased cost with ultrasound guided versus blind injection.    Verbal informed consent obtained.  Time-out conducted.  Noted no overlying erythema, induration, or other signs of local infection.  Skin prepped in a sterile fashion.  Local anesthesia: Topical Ethyl chloride.  With sterile technique and under real time ultrasound guidance:  With a 21-gauge 2 and she'll patient was injected with a total of 2 mL of 0.5% Marcaine and 1 mL of Kenalog 40 mg/dL. Completed without difficulty  Pain immediately resolved suggesting accurate placement of the medication.  Advised to call if fevers/chills, erythema, induration, drainage, or persistent bleeding.  Images permanently stored and available for review in the ultrasound unit.  Impression: Technically successful ultrasound guided injection.  Procedure: Real-time Ultrasound Guided Injection of right ischial bursa Device: GE Logiq Q7 Ultrasound guided injection is preferred based studies that show increased duration, increased effect, greater accuracy, decreased procedural pain, increased response rate, and decreased cost with ultrasound guided versus blind injection.  Verbal informed consent obtained.  Time-out conducted.  Noted  no overlying erythema, induration, or other signs of local infection.  Skin prepped in a sterile fashion.  Local anesthesia: Topical Ethyl chloride.  With sterile technique and under real time ultrasound guidance:  With a 21-gauge 2 inch needle patient was injected with a total of 0.5 mL of 0.5% Marcaine and 1 mL of Kenalog 40 mg/dL. Completed without difficulty  Pain immediately resolved suggesting accurate placement of the medication.  Advised to call if fevers/chills, erythema, induration, drainage, or persistent bleeding.  Images permanently stored and available for review in the ultrasound unit.  Impression: Technically successful ultrasound guided injection.    Impression and Recommendations:     This case required medical decision making of moderate complexity.       Note: This dictation was prepared with Dragon dictation along with smaller phrase technology. Any transcriptional errors that result from this process are unintentional.

## 2017-03-01 NOTE — Progress Notes (Signed)
Pre-visit discussion using our clinic review tool. No additional management support is needed unless otherwise documented below in the visit note.  

## 2017-03-01 NOTE — Patient Instructions (Addendum)
Good to see you  We injected again.  I hope this calms it ddown Start the exercises again at least 3 times a week.  We will get back xray as well today  Gabapentin 200mg  at night See me again in 4-6 weeks

## 2017-04-05 ENCOUNTER — Ambulatory Visit (INDEPENDENT_AMBULATORY_CARE_PROVIDER_SITE_OTHER): Payer: Medicare HMO | Admitting: Family Medicine

## 2017-04-05 ENCOUNTER — Encounter: Payer: Self-pay | Admitting: Family Medicine

## 2017-04-05 DIAGNOSIS — M7070 Other bursitis of hip, unspecified hip: Secondary | ICD-10-CM

## 2017-04-05 NOTE — Progress Notes (Signed)
[name] [address] [place],  [address] Phone: [phone] Subjective:    I'm seeing this patient by the request  of:  Robert Deutscher, DO   CC: buttocks pain f/u  [id number]  Robert [name] is a 71 y.o. male coming in with complaint of buttock pain. Found to have bilateral ischial bursitis. Patient has been seen and did have a repeat injection 03/01/2017 bilaterally. 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 Patient was prone on gabapentin. States that with the injections as well as the medications he is feeling 90-95% better. Patient states that very minimal discomfort in the buttocks area still remaining. Mild tightness of the lower back. Sleeping comfortably with the medication with no side effects in the morning.     Past Medical History:  Diagnosis Date  . CAD (coronary artery disease) 10/10/2011   NSTEMI with DES to the proximal RCA following flow wire evaluation. EF is normal.   . GERD (gastroesophageal reflux disease)   . H/O esophageal ulcer   . History of stomach ulcers   . Hyperlipidemia   . Hypertension   . Migraines    "quite often; maybe q 10d to 2 wk" (01/14/2014)  . Myocardial infarction ([place]) 10/2011  . OSA on CPAP    "wear it most of the time" (01/14/2014)  . 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 BACK SURGERY    . CERVICAL DISC SURGERY  1980's?  . CORONARY ANGIOPLASTY WITH STENT PLACEMENT  10/13/2011   DES to the proximal RCA. Dr Martinique  . FRACTIONAL FLOW RESERVE WIRE Right 10/13/2011   Procedure: FRACTIONAL FLOW RESERVE WIRE;  Surgeon: Peter M Martinique, MD;  Location: [facility] CATH LAB;  Service: Cardiovascular;  Laterality: Right;  . HERNIA REPAIR    . INGUINAL HERNIA REPAIR  10/23/2012   Procedure: HERNIA REPAIR INGUINAL ADULT;  Surgeon: Odis Hollingshead, MD;  Location: [place];  Service: General;  Laterality: Left;   Left lower quadrant; left inguinal hernia repair with mesh  . INSERTION OF MESH  10/23/2012   Procedure: INSERTION OF MESH;  Surgeon: Odis Hollingshead, MD;  Location: [place];  Service: General;  Laterality: Left;  Left lower quadrant  . LEFT HEART CATHETERIZATION WITH CORONARY ANGIOGRAM N/A 10/13/2011   Procedure: LEFT HEART CATHETERIZATION WITH CORONARY ANGIOGRAM;  Surgeon: Peter M Martinique, MD;  Location: [facility] CATH LAB;  Service: Cardiovascular;  Laterality: N/A;  . PERCUTANEOUS CORONARY STENT INTERVENTION (PCI-S) Right 10/13/2011   Procedure: PERCUTANEOUS CORONARY STENT INTERVENTION (PCI-S);  Surgeon: Peter M Martinique, MD;  Location: [facility] CATH LAB;  Service: Cardiovascular;  Laterality: Right;  . SHOULDER ARTHROSCOPY W/ ROTATOR CUFF REPAIR Left 1990's  . TRIGGER FINGER RELEASE Left    3rd and 4th digits   Social History   Social History  . Marital status: Married    Spouse name: N/A  . Number of children: N/A  . Years of education: N/A   Social History Main Topics  . Smoking status: Former Smoker    Packs/day: 2.00    Years: 20.00    Types: Cigarettes    Quit date: 11/14/1982  . Smokeless tobacco: Never Used  . Alcohol use 3.6 oz/week    6 Cans of beer per week  . Drug use: No  . Sexual activity: Not Currently   Other Topics Concern  . None   Social  History Narrative   Pt lives with his wife. 1 and a half story home  No issues with stairs. 12th grade education      Allergies  Allergen Reactions  . Penicillins     itching  . Sumatriptan     REACTION: sob, headache scalp sensitivity  . Oxycodone Itching   Family History  Problem Relation Age of Onset  . Cancer Mother        lung  . Heart disease Father        heart attack   . Cancer Father        colon    Past medical history, social, surgical and family history all reviewed in electronic medical record.  No pertanent information unless stated regarding to the chief complaint.   Review of Systems: No headache, visual  changes, nausea, vomiting, diarrhea, constipation, dizziness, abdominal pain, skin rash, fevers, chills, night sweats, weight loss, swollen lymph nodes, body aches, joint swelling,  chest pain, shortness of breath, mood changes.  Still positive muscle aches  Objective  Blood pressure 128/72, pulse (!) 59, height 6\' 1"  (1.854 m), weight 212 lb (96.2 kg), SpO2 96 %.   Systems examined below as of 04/05/17 General: NAD A&O x3 mood, affect normal  HEENT: Pupils equal, extraocular movements intact no nystagmus Respiratory: not short of breath at rest or with speaking Cardiovascular: No lower extremity edema, non tender Skin: Warm dry intact with no signs of infection or rash on extremities or on axial skeleton. Abdomen: Soft nontender, no masses Neuro: Cranial nerves  intact, neurovascularly intact in all extremities with 2+ DTRs and 2+ pulses. Lymph: No lymphadenopathy appreciated today  Gait normal with good balance and coordination.  MSK: Non tender with full range of motion and good stability and symmetric strength and tone of shoulders, elbows, wrist,  knee hips and ankles bilaterally.  Arthritic changes of multiple joints  Back Exam:  Inspection: Unremarkable  Motion: Flexion 35 deg, Extension 15 deg, Side Bending to 35 deg bilaterally,  Rotation to 25 deg bilaterally  SLR laying: Negative  XSLR laying: Negative  Palpable tenderness: Tender to palpation and appears palmar musculature of the lumbar spine record of left. FABER: Tightness on the left. Significant tightness of the hamstrings bilaterally still. Sensory change: Gross sensation intact to all lumbar and sacral dermatomes.  Reflexes: 2+ at both patellar tendons, 2+ at achilles tendons, Babinski's downgoing.  Strength at foot  Plantar-flexion: 5/5 Dorsi-flexion: 5/5 Eversion: 5/5 Inversion: 5/5  Leg strength  Quad: 5/5 Hamstring: 5/5 Hip flexor: 5/5 Hip abductors: 5/5  Gait unremarkable.      Impression and  Recommendations:     This case required medical decision making of moderate complexity.      Note: This dictation was prepared with Dragon dictation along with smaller phrase technology. Any transcriptional errors that result from this process are unintentional.

## 2017-04-05 NOTE — Patient Instructions (Signed)
Good to see you  We can repeat injection if needed every 3 months but I hope it last a lot longer thgh compression sleeve with a lot of activity  Conitnue the exercises at least 2 times a week.  Stay active.  Gabapentin go down to 100mg  nightly for 1 week, if pain worsens go back or otherwise can try to discontinue The back is l=playing a little role and we will watch  See me again in 6-8 weeks

## 2017-04-05 NOTE — Assessment & Plan Note (Signed)
Patient seems to be doing fairly well with conservative therapy. I do feel it lumbar radiculopathy is within the differential. Patient wants to continue with conservative therapy. Encourage him to titrate gabapentin from 100-300 mg as needed at night. Patient will continue with the range of motion exercises. Patient will follow-up with me 6-8 weeks.

## 2017-04-07 DIAGNOSIS — H5213 Myopia, bilateral: Secondary | ICD-10-CM | POA: Diagnosis not present

## 2017-04-14 ENCOUNTER — Telehealth: Payer: Self-pay | Admitting: Family Medicine

## 2017-04-14 ENCOUNTER — Ambulatory Visit (INDEPENDENT_AMBULATORY_CARE_PROVIDER_SITE_OTHER): Payer: Medicare HMO | Admitting: Family Medicine

## 2017-04-14 ENCOUNTER — Encounter: Payer: Self-pay | Admitting: Family Medicine

## 2017-04-14 VITALS — BP 132/64 | HR 87 | Temp 98.0°F | Wt 208.0 lb

## 2017-04-14 DIAGNOSIS — S80861A Insect bite (nonvenomous), right lower leg, initial encounter: Secondary | ICD-10-CM

## 2017-04-14 DIAGNOSIS — Z8619 Personal history of other infectious and parasitic diseases: Secondary | ICD-10-CM | POA: Diagnosis not present

## 2017-04-14 DIAGNOSIS — W57XXXA Bitten or stung by nonvenomous insect and other nonvenomous arthropods, initial encounter: Secondary | ICD-10-CM

## 2017-04-14 MED ORDER — DOXYCYCLINE HYCLATE 100 MG PO CAPS: 100.0000 mg | ORAL_CAPSULE | Freq: Two times a day (BID) | ORAL | 0 refills | Status: DC

## 2017-04-14 NOTE — Telephone Encounter (Signed)
Patient's wife Robert [name] called to report he was bit by tick and it's getting infected. Acute sched 04/14/17 at [facility] with NP Carlean Purl.

## 2017-04-14 NOTE — Progress Notes (Signed)
Subjective:    Patient ID: Robert [name], male    DOB: [birth year], 71 y.o.   MRN: [id number]  HPI This is a 71 yo male who presents today following a tick bite 2 days ago. Has dogs and was working in the woods and high grass. Woke in the middle of the night 2 nights ago and removed a tick from the back of his right leg. Has noticed redness and swelling at site. States that he gets infections from tick bites. Has had [facility] Spotted Fever many years ago.   Past Medical History:  Diagnosis Date  . CAD (coronary artery disease) 10/10/2011   NSTEMI with DES to the proximal RCA following flow wire evaluation. EF is normal.   . GERD (gastroesophageal reflux disease)   . H/O esophageal ulcer   . History of stomach ulcers   . Hyperlipidemia   . Hypertension   . Migraines    "quite often; maybe q 10d to 2 wk" (01/14/2014)  . Myocardial infarction ([place]) 10/2011  . OSA on CPAP    "wear it most of the time" (01/14/2014)  . 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 BACK SURGERY    . CERVICAL DISC SURGERY  1980's?  . CORONARY ANGIOPLASTY WITH STENT PLACEMENT  10/13/2011   DES to the proximal RCA. Dr Martinique  . FRACTIONAL FLOW RESERVE WIRE Right 10/13/2011   Procedure: FRACTIONAL FLOW RESERVE WIRE;  Surgeon: Peter M Martinique, MD;  Location[facility] CATH LAB;  Service: Cardiovascular;  Laterality: Right;  . HERNIA REPAIR    . INGUINAL HERNIA REPAIR  10/23/2012   Procedure: HERNIA REPAIR INGUINAL ADULT;  Surgeon: Odis Hollingshead, MD;  Location: [place];  Service: General;  Laterality: Left;  Left lower quadrant; left inguinal hernia repair with mesh  . INSERTION OF MESH  10/23/2012   Procedure: INSERTION OF MESH;  Surgeon: Odis Hollingshead, MD;  Location: [place];  Service: General;  Laterality: Left;  Left lower quadrant  . LEFT HEART CATHETERIZATION WITH CORONARY ANGIOGRAM N/A 10/13/2011   Procedure: LEFT HEART CATHETERIZATION WITH  CORONARY ANGIOGRAM;  Surgeon: Peter M Martinique, MD;  Location: [facility] CATH LAB;  Service: Cardiovascular;  Laterality: N/A;  . PERCUTANEOUS CORONARY STENT INTERVENTION (PCI-S) Right 10/13/2011   Procedure: PERCUTANEOUS CORONARY STENT INTERVENTION (PCI-S);  Surgeon: Peter M Martinique, MD;  Location: [facility] CATH LAB;  Service: Cardiovascular;  Laterality: Right;  . SHOULDER ARTHROSCOPY W/ ROTATOR CUFF REPAIR Left 1990's  . TRIGGER FINGER RELEASE Left    3rd and 4th digits      Review of Systems  Constitutional: Negative for fatigue and fever.  Musculoskeletal: Negative for arthralgias and myalgias.  Skin: Negative for rash.  Neurological: Positive for headaches (ususal for him, nothing unusual).       Objective:   Physical Exam  Constitutional: He is oriented to person, place, and time. He appears well-developed and well-nourished. No distress.  HENT:  Head: Normocephalic and atraumatic.  Cardiovascular: Normal rate.   Pulmonary/Chest: Effort normal.  Neurological: He is alert and oriented to person, place, and time.  Skin: Skin is warm and dry. Lesion noted. He is not diaphoretic.     Vitals reviewed.     BP 132/64 (BP Location: Right Arm, Patient Position: Sitting, Cuff Size: Normal)   Pulse 87   Temp 98 F (36.7 C) (Oral)   Wt 208 lb (94.3 kg)   SpO2  93%   BMI 27.44 kg/m      Assessment & Plan:  1. Tick bite, initial encounter - given localized erythema and pustule and history of RMSF, will go ahead and treat to cover for cellulitis - doxycycline (VIBRAMYCIN) 100 MG capsule; Take 1 capsule (100 mg total) by mouth 2 (two) times daily.  Dispense: 14 capsule; Refill: 0 - Provided written and verbal information regarding diagnosis and treatment. - RTC/ER precautions reviewed  2. History of [facility] [place] spotted fever - doxycycline (VIBRAMYCIN) 100 MG capsule; Take 1 capsule (100 mg total) by mouth 2 (two) times daily.  Dispense: 14 capsule; Refill: 0   Clarene Reamer,  FNP-BC   [facility] [place]  04/14/2017 4:58 PM

## 2017-04-14 NOTE — Patient Instructions (Addendum)
Please let us know if you develop any fever, rash, muscle aches or severe headache  Tick Bite Information, Adult Ticks are insects that draw blood for food. Most ticks live in shrubs and grassy areas. They climb onto people and animals that brush against the leaves and grasses that they rest on. Then they bite, attaching themselves to the skin. Most ticks are harmless, but some ticks carry germs that can spread to a person through a bite and cause a disease. To reduce your risk of getting a disease from a tick bite, it is important to take steps to prevent tick bites. It is also important to check for ticks after being outdoors. If you find that a tick has attached to you, watch for symptoms of disease. How can I prevent tick bites? Take these steps to help prevent tick bites when you are outdoors in an area where ticks are found:  Use insect repellent that has DEET (20% or higher), picaridin, or IR3535 in it. Use it on: ? Skin that is showing. ? The top of your boots. ? Your pant legs. ? Your sleeve cuffs.  For repellent products that contain permethrin, follow product instructions. Use these products on: ? Clothing. ? Gear. ? Boots. ? Tents.  Wear protective clothing. Long sleeves and long pants offer the best protection from ticks.  Wear light-colored clothing so you can see ticks more easily.  Tuck your pant legs into your socks.  If you go walking on a trail, stay in the middle of the trail so your skin, hair, and clothing do not touch the bushes.  Avoid walking through areas with long grass.  Check for ticks on your clothing, hair, and skin often while you are outside, and check again before you go inside. Make sure to check the places that ticks attach themselves most often. These places include the scalp, neck, armpits, waist, groin, and joint areas. Ticks that carry a disease called Lyme disease have to be attached to the skin for 24-48 hours. Checking for ticks every day will  lessen your risk of this and other diseases.  When you come indoors, wash your clothes and take a shower or a bath right away. Dry your clothes in a dryer on high heat for at least 60 minutes. This will kill any ticks in your clothes.  What is the proper way to remove a tick? If you find a tick on your body, remove it as soon as possible. Removing a tick sooner rather than later can prevent germs from passing from the tick to your body. To remove a tick that is crawling on your skin but has not bitten:  Go outdoors and brush the tick off.  Remove the tick with tape or a lint roller.  To remove a tick that is attached to your skin:  Wash your hands.  If you have latex gloves, put them on.  Use tweezers, curved forceps, or a tick-removal tool to gently grasp the tick as close to your skin and the tick's head as possible.  Gently pull with steady, upward pressure until the tick lets go. When removing the tick: ? Take care to keep the tick's head attached to its body. ? Do not twist or jerk the tick. This can make the tick's head or mouth break off. ? Do not squeeze or crush the tick's body. This could force disease-carrying fluids from the tick into your body.  Do not try to remove a tick with heat,  alcohol, petroleum jelly, or fingernail polish. Using these methods can cause the tick to salivate and regurgitate into your bloodstream, increasing your risk of getting a disease. What should I do after removing a tick?  Clean the bite area with soap and water, rubbing alcohol, or an iodine scrub.  If an antiseptic cream or ointment is available, apply a small amount to the bite site.  Wash and disinfect any instruments that you used to remove the tick. How should I dispose of a tick? To dispose of a live tick, use one of these methods:  Place it in rubbing alcohol.  Place it in a sealed bag or container.  Wrap it tightly in tape.  Flush it down the toilet.  Contact a health care  provider if:  You have symptoms of a disease after a tick bite. Symptoms of a tick-borne disease can occur from moments after the tick bites to up to 30 days after a tick is removed. Symptoms include: ? Muscle, joint, or bone pain. ? Difficulty walking or moving your legs. ? Numbness in the legs. ? Paralysis. ? Red rash around the tick bite area that is shaped like a target or a "bull's-eye." ? Redness and swelling in the area of the tick bite. ? Fever. ? Repeated vomiting. ? Diarrhea. ? Weight loss. ? Tender, swollen lymph glands. ? Shortness of breath. ? Cough. ? Pain in the abdomen. ? Headache. ? Abnormal tiredness. ? A change in your level of consciousness. ? Confusion. Get help right away if:  You are not able to remove a tick.  A part of a tick breaks off and gets stuck in your skin.  Your symptoms get worse. Summary  Ticks may carry germs that can spread to a person through a bite and cause disease.  Wear protective clothing and use insect repellent to prevent tick bites. Follow product instructions.  If you find a tick on your body, remove it as soon as possible. If the tick is attached, do not try to remove with heat, alcohol, petroleum jelly, or fingernail polish.  Remove the attached tick using tweezers, curved forceps, or a tick-removal tool. Gently pull with steady, upward pressure until the tick lets go. Do not twist or jerk the tick. Do not squeeze or crush the tick's body.  If you have symptoms after being bitten by a tick, contact a health care provider. This information is not intended to replace advice given to you by your health care provider. Make sure you discuss any questions you have with your health care provider. Document Released: 10/28/2000 Document Revised: 08/12/2016 Document Reviewed: 08/12/2016 Elsevier Interactive Patient Education  [employer].

## 2017-04-26 DIAGNOSIS — L738 Other specified follicular disorders: Secondary | ICD-10-CM | POA: Diagnosis not present

## 2017-04-26 DIAGNOSIS — Z85828 Personal history of other malignant neoplasm of skin: Secondary | ICD-10-CM | POA: Diagnosis not present

## 2017-04-26 DIAGNOSIS — C44622 Squamous cell carcinoma of skin of right upper limb, including shoulder: Secondary | ICD-10-CM | POA: Diagnosis not present

## 2017-04-26 DIAGNOSIS — S80861A Insect bite (nonvenomous), right lower leg, initial encounter: Secondary | ICD-10-CM | POA: Diagnosis not present

## 2017-04-26 DIAGNOSIS — D485 Neoplasm of uncertain behavior of skin: Secondary | ICD-10-CM | POA: Diagnosis not present

## 2017-04-28 DIAGNOSIS — E782 Mixed hyperlipidemia: Secondary | ICD-10-CM | POA: Diagnosis not present

## 2017-04-28 LAB — HEPATIC FUNCTION PANEL
ALBUMIN: 4.2 g/dL (ref 3.6–5.1)
ALK PHOS: 46 U/L (ref 40–115)
ALT: 28 U/L (ref 9–46)
AST: 23 U/L (ref 10–35)
BILIRUBIN TOTAL: 0.5 mg/dL (ref 0.2–1.2)
Bilirubin, Direct: 0.1 mg/dL (ref <=0.2)
Indirect Bilirubin: 0.4 mg/dL (ref 0.2–1.2)
Total Protein: 6.4 g/dL (ref 6.1–8.1)

## 2017-04-28 LAB — LIPID PANEL
Cholesterol: 149 mg/dL (ref <200)
HDL: 68 mg/dL (ref >40)
LDL CALC: 59 mg/dL (ref <100)
Total CHOL/HDL Ratio: 2.2 Ratio (ref <5.0)
Triglycerides: 109 mg/dL (ref <150)
VLDL: 22 mg/dL (ref <30)

## 2017-05-03 ENCOUNTER — Ambulatory Visit (INDEPENDENT_AMBULATORY_CARE_PROVIDER_SITE_OTHER): Payer: Medicare HMO | Admitting: *Deleted

## 2017-05-03 ENCOUNTER — Encounter: Payer: Self-pay | Admitting: *Deleted

## 2017-05-03 VITALS — BP 122/62 | HR 58 | Resp 16 | Ht 73.0 in | Wt 211.6 lb

## 2017-05-03 DIAGNOSIS — Z0000 Encounter for general adult medical examination without abnormal findings: Secondary | ICD-10-CM | POA: Diagnosis not present

## 2017-05-03 NOTE — Progress Notes (Addendum)
Subjective:   Robert [name] is a 71 y.o. male who presents for an Initial Medicare Annual Wellness Visit.  Review of Systems  No ROS.  Medicare Wellness Visit. Additional risk factors are reflected in the social history.  Cardiac Risk Factors include: advanced age (>1men, >44 women);dyslipidemia;hypertension;male gender;obesity (BMI >30kg/m2)   Sleep patterns: 8 hrs/night. Feels rested upon waking. Wears CPAP to sleep. Gets up "once in a while" to use restroom at night. Home Safety/Smoke Alarms: Feels safe in home. Smoke alarms in place.  Living environment; residence and Firearm Safety: Two story home, lives with wife.  Seat Belt Safety/Bike Helmet: Wears seat belt.   Counseling:   Dental- Every 6 months.  Male:   CCS-    3 years ago through [place]. Scheduled to have another this August. [facility] during [facility]. PSA-  Lab Results  Component Value Date   PSA 0.87 03/31/2010   PSA 0.62 02/24/2009   PSA 0.69 01/01/2008      Objective:    Today's Vitals   05/03/17 0758  BP: 122/62  Pulse: (!) 58  Resp: 16  SpO2: 95%  Weight: 211 lb 9.6 oz (96 kg)  Height: 6\' 1"  (1.854 m)   Body mass index is 27.92 kg/m.  Current Medications (verified) Outpatient Encounter Prescriptions as of 05/03/2017  Medication Sig  . aspirin EC 81 MG tablet Take 81 mg by mouth daily.  . Cholecalciferol (VITAMIN D) 2000 UNITS CAPS Take 1 capsule by mouth daily.  . cyclobenzaprine (FLEXERIL) 10 MG tablet Take 0.5-1 tablets (5-10 mg total) by mouth 3 (three) times daily as needed for muscle spasms.  . diclofenac (VOLTAREN) 50 MG EC tablet Take 50 mg by mouth 2 (two) times daily.  Marland Kitchen doxycycline (VIBRAMYCIN) 100 MG capsule Take 1 capsule (100 mg total) by mouth 2 (two) times daily.  Marland Kitchen ezetimibe (ZETIA) 10 MG tablet Take 5 mg by mouth daily.  . fish oil-omega-3 fatty acids 1000 MG capsule Take 1 g by mouth daily.  Marland Kitchen gabapentin (NEURONTIN) 100 MG capsule Take 2 capsules (200 mg  total) by mouth at bedtime.  Marland Kitchen levocetirizine (XYZAL) 5 MG tablet Take 5 mg by mouth every evening.   . meclizine (ANTIVERT) 12.5 MG tablet Take 1 tablet (12.5 mg total) by mouth 3 (three) times daily as needed for dizziness.  . metoprolol tartrate (LOPRESSOR) 25 MG tablet Take 12.5 mg by mouth daily.  . mometasone (NASONEX) 50 MCG/ACT nasal spray Place 2 sprays into the nose daily. Reported on 01/18/2016  . Multiple Vitamins-Minerals (MULTIVITAMINS THER. W/MINERALS) TABS Take 1 tablet by mouth daily.  . nitroGLYCERIN (NITROSTAT) 0.4 MG SL tablet Place 1 tablet (0.4 mg total) under the tongue every 5 (five) minutes as needed for chest pain. MAX 3 doses  . pantoprazole (PROTONIX) 40 MG tablet Take 1 tablet (40 mg total) by mouth daily.  . rosuvastatin (CRESTOR) 40 MG tablet Take 1 tablet (40 mg total) by mouth daily.  . traMADol (ULTRAM) 50 MG tablet Take 50 mg by mouth every 6 (six) hours as needed. For pain  . verapamil (CALAN) 40 MG tablet Take 1 tablet (40 mg total) by mouth 3 (three) times daily.  . vitamin C (ASCORBIC ACID) 500 MG tablet Take 500 mg by mouth daily.   No facility-administered encounter medications on file as of 05/03/2017.     Allergies (verified) Penicillins; Sumatriptan; and Oxycodone   History: Past Medical History:  Diagnosis Date  . CAD (coronary artery  disease) 10/10/2011   NSTEMI with DES to the proximal RCA following flow wire evaluation. EF is normal.   . GERD (gastroesophageal reflux disease)   . H/O esophageal ulcer   . History of stomach ulcers   . Hyperlipidemia   . Hypertension   . Migraines    "quite often; maybe q 10d to 2 wk" (01/14/2014)  . Myocardial infarction ([place]) 10/2011  . OSA on CPAP    "wear it most of the time" (01/14/2014)  . 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 BACK SURGERY    . CERVICAL DISC SURGERY  1980's?  . CORONARY ANGIOPLASTY WITH STENT PLACEMENT  10/13/2011     DES to the proximal RCA. Dr Martinique  . FRACTIONAL FLOW RESERVE WIRE Right 10/13/2011   Procedure: FRACTIONAL FLOW RESERVE WIRE;  Surgeon: Peter M Martinique, MD;  Location: [facility] CATH LAB;  Service: Cardiovascular;  Laterality: Right;  . HERNIA REPAIR    . INGUINAL HERNIA REPAIR  10/23/2012   Procedure: HERNIA REPAIR INGUINAL ADULT;  Surgeon: Odis Hollingshead, MD;  Location: [place];  Service: General;  Laterality: Left;  Left lower quadrant; left inguinal hernia repair with mesh  . INSERTION OF MESH  10/23/2012   Procedure: INSERTION OF MESH;  Surgeon: Odis Hollingshead, MD;  Location: [place];  Service: General;  Laterality: Left;  Left lower quadrant  . LEFT HEART CATHETERIZATION WITH CORONARY ANGIOGRAM N/A 10/13/2011   Procedure: LEFT HEART CATHETERIZATION WITH CORONARY ANGIOGRAM;  Surgeon: Peter M Martinique, MD;  Location[facility] CATH LAB;  Service: Cardiovascular;  Laterality: N/A;  . PERCUTANEOUS CORONARY STENT INTERVENTION (PCI-S) Right 10/13/2011   Procedure: PERCUTANEOUS CORONARY STENT INTERVENTION (PCI-S);  Surgeon: Peter M Martinique, MD;  Location: [facility] CATH LAB;  Service: Cardiovascular;  Laterality: Right;  . SHOULDER ARTHROSCOPY W/ ROTATOR CUFF REPAIR Left 1990's  . TRIGGER FINGER RELEASE Left    3rd and 4th digits   Family History  Problem Relation Age of Onset  . Cancer Mother        lung  . Heart disease Father        heart attack   . Cancer Father        colon   Social History   Occupational History  . Not on file.   Social History Main Topics  . Smoking status: Former Smoker    Packs/day: 2.00    Years: 20.00    Types: Cigarettes    Quit date: 11/14/1982  . Smokeless tobacco: Never Used  . Alcohol use 3.6 oz/week    6 Cans of beer per week  . Drug use: No  . Sexual activity: Not Currently   Tobacco Counseling Counseling given: Not Answered   Activities of Daily Living In your present state of health, do you have any difficulty performing the following activities:  05/03/2017 05/03/2017  Hearing? N N  Vision? N N  Difficulty concentrating or making decisions? N N  Walking or climbing stairs? N N  Dressing or bathing? N N  Doing errands, shopping? N N  Preparing Food and eating ? N N  Using the Toilet? N N  In the past six months, have you accidently leaked urine? N N  Do you have problems with loss of bowel control? N N  Managing your Medications? N N  Managing your Finances? N N  Housekeeping or managing your Housekeeping? N N  Some recent data might  be hidden    Immunizations and Health Maintenance Immunization History  Administered Date(s) Administered  . Influenza Whole 11/14/2005  . Influenza-Unspecified 07/15/2013, 10/14/2016  . Pneumococcal Polysaccharide-23 03/03/2009  . Td 10/14/2006  . Zoster 10/28/2009   Health Maintenance Due  Topic Date Due  . Hepatitis C Screening  Oct 15, 1946  . COLONOSCOPY  02/21/2001  . PNA vac Low Risk Adult (2 of 2 - PCV13) 03/03/2010  . TETANUS/TDAP  10/14/2016    Patient Care Team: Briscoe Deutscher, DO as PCP - General (Family Medicine)  Indicate any recent Medical Services you may have received from other than [facility] providers in the past year (date may be approximate).    Assessment:   This is a routine wellness examination for [place].   Hearing/Vision screen Hearing Screening Comments: Able to hear conversational tones w/o difficulty. No issues reported.  Last hearing check 2 years ago through [place].   Vision Screening Comments: Yearly. Dr Tommy Rainwater August for cataract removal consult. Wears bifocals.  Dietary issues and exercise activities discussed: Exercise: Pt states that he golfs 3 days a week and for about 5 hrs.  Diet (meal preparation, eat out, water intake, caffeinated beverages, dairy products, fruits and vegetables): 2-3 cups coffee in morning. Drinks 6-8 glasses water/day. Snacks in evening sometimes on crackers.  Breakfast: English muffin with egg and cheese. Lunch: [employer], [employer].  Dinner:   Usually light. Toast or salad  Goals    None     Depression Screen PHQ 2/9 Scores 05/03/2017 11/28/2016 01/15/2015  PHQ - 2 Score 0 0 0    Fall Risk Fall Risk  05/03/2017 11/28/2016 09/22/2015 01/15/2015  Falls in the past year? No No Yes No  Number falls in past yr: - - 1 -  Injury with Fall? - - No -  Follow up - - Falls evaluation completed -    Cognitive Function: MMSE - Mini Mental State Exam 05/03/2017  Orientation to time 5  Orientation to Place 5  Registration 3  Attention/ Calculation 5  Recall 3  Language- name 2 objects 2  Language- repeat 1  Language- follow 3 step command 3  Language- read & follow direction 1  Write a sentence 1  Copy design 1  Total score 30        Screening Tests Health Maintenance  Topic Date Due  . Hepatitis C Screening  Apr 14, 1946  . COLONOSCOPY  02/21/2001  . PNA vac Low Risk Adult (2 of 2 - PCV13) 03/03/2010  . TETANUS/TDAP  10/14/2016  . INFLUENZA VACCINE  06/14/2017        Plan:    Bring a copy of your advance directives to your next office visit. Records release signed for the [facility] during visit. Pt states that he believes he received his PCV 13 through [place]. Pt also receives colonoscopies through [place]. Pt will update Tdap through [place] as well if he is due.  I have personally reviewed and noted the following in the patient's chart:   . Medical and social history . Use of alcohol, tobacco or illicit drugs  . Current medications and supplements . Functional ability and status . Nutritional status . Physical activity . Advanced directives . List of other physicians . Vitals . Screenings to include cognitive, depression, and falls . Referrals and appointments  In addition, I have reviewed and discussed with patient certain preventive protocols, quality metrics, and best practice recommendations. A written personalized care plan for preventive services as well as general preventive  health  recommendations were provided to patient.     Ree Edman, RN   05/03/2017    I have personally reviewed the Medicare Annual Wellness questionnaire and have noted 1. The patient's medical and social history 2. Their use of alcohol, tobacco or illicit drugs 3. Their current medications and supplements 4. The patient's functional ability including ADL's, fall risks, home safety risks and hearing or visual impairment. 5. Diet and physical activities 6. Evidence for depression or mood disorders 7. Reviewed Updated provider list, see scanned forms and [facility] Snapshot.   The patients weight, height, BMI and visual acuity have been recorded in the chart I have made referrals, counseling and provided education to the patient based review of the above and I have provided the pt with a written personalized care plan for preventive services.  I have provided the patient with a copy of your personalized plan for preventive services. Instructed to take the time to review along with their updated medication list.  Briscoe Deutscher, D.O. [place], [facility]

## 2017-05-03 NOTE — Progress Notes (Signed)
Pre visit review using our clinic review tool, if applicable. No additional management support is needed unless otherwise documented below in the visit note. 

## 2017-05-03 NOTE — Patient Instructions (Signed)
Bring a copy of your advance directives to your next office visit. Speak to the [facility] regarding your Tdap.  Records will be requested from the [place].  Preventive Care 73 Years and Older, Male Preventive care refers to lifestyle choices and visits with your health care provider that can promote health and wellness. What does preventive care include?  A yearly physical exam. This is also called an annual well check.  Dental exams once or twice a year.  Routine eye exams. Ask your health care provider how often you should have your eyes checked.  Personal lifestyle choices, including: ? Daily care of your teeth and gums. ? Regular physical activity. ? Eating a healthy diet. ? Avoiding tobacco and drug use. ? Limiting alcohol use. ? Practicing safe sex. ? Taking low doses of aspirin every day. ? Taking vitamin and mineral supplements as recommended by your health care provider. What happens during an annual well check? The services and screenings done by your health care provider during your annual well check will depend on your age, overall health, lifestyle risk factors, and family history of disease. Counseling Your health care provider may ask you questions about your:  Alcohol use.  Tobacco use.  Drug use.  Emotional well-being.  Home and relationship well-being.  Sexual activity.  Eating habits.  History of falls.  Memory and ability to understand (cognition).  Work and work Astronomer.  Screening You may have the following tests or measurements:  Height, weight, and BMI.  Blood pressure.  Lipid and cholesterol levels. These may be checked every 5 years, or more frequently if you are over 73 years old.  Skin check.  Lung cancer screening. You may have this screening every year starting at age 73 if you have a 30-pack-year history of smoking and currently smoke or have quit within the past 15 years.  Fecal occult blood test (FOBT) of the stool. You may have  this test every year starting at age 73.  Flexible sigmoidoscopy or colonoscopy. You may have a sigmoidoscopy every 5 years or a colonoscopy every 10 years starting at age 73.  Prostate cancer screening. Recommendations will vary depending on your family history and other risks.  Hepatitis C blood test.  Hepatitis B blood test.  Sexually transmitted disease (STD) testing.  Diabetes screening. This is done by checking your blood sugar (glucose) after you have not eaten for a while (fasting). You may have this done every 1-3 years.  Abdominal aortic aneurysm (AAA) screening. You may need this if you are a current or former smoker.  Osteoporosis. You may be screened starting at age 73 if you are at high risk.  Talk with your health care provider about your test results, treatment options, and if necessary, the need for more tests. Vaccines Your health care provider may recommend certain vaccines, such as:  Influenza vaccine. This is recommended every year.  Tetanus, diphtheria, and acellular pertussis (Tdap, Td) vaccine. You may need a Td booster every 10 years.  Varicella vaccine. You may need this if you have not been vaccinated.  Zoster vaccine. You may need this after age 73.  Measles, mumps, and rubella (MMR) vaccine. You may need at least one dose of MMR if you were born in 1957 or later. You may also need a second dose.  Pneumococcal 13-valent conjugate (PCV13) vaccine. One dose is recommended after age 73.  Pneumococcal polysaccharide (PPSV23) vaccine. One dose is recommended after age 73.  Meningococcal vaccine. You may need this  if you have certain conditions.  Hepatitis A vaccine. You may need this if you have certain conditions or if you travel or work in places where you may be exposed to hepatitis A.  Hepatitis B vaccine. You may need this if you have certain conditions or if you travel or work in places where you may be exposed to hepatitis B.  Haemophilus  influenzae type b (Hib) vaccine. You may need this if you have certain risk factors.  Talk to your health care provider about which screenings and vaccines you need and how often you need them. This information is not intended to replace advice given to you by your health care provider. Make sure you discuss any questions you have with your health care provider. Document Released: 11/27/2015 Document Revised: 07/20/2016 Document Reviewed: 09/01/2015 Elsevier Interactive Patient Education  2017 [employer].

## 2017-05-05 ENCOUNTER — Ambulatory Visit (INDEPENDENT_AMBULATORY_CARE_PROVIDER_SITE_OTHER): Payer: Medicare HMO | Admitting: Family Medicine

## 2017-05-05 ENCOUNTER — Ambulatory Visit: Payer: Medicare HMO | Admitting: Physician Assistant

## 2017-05-05 ENCOUNTER — Encounter: Payer: Self-pay | Admitting: Family Medicine

## 2017-05-05 VITALS — BP 134/64 | HR 61 | Temp 97.7°F | Wt 212.4 lb

## 2017-05-05 DIAGNOSIS — R238 Other skin changes: Secondary | ICD-10-CM | POA: Diagnosis not present

## 2017-05-05 NOTE — Progress Notes (Signed)
Subjective:    Patient ID: Robert [name], male    DOB: 03-21-1946, 71 y.o.   MRN: [id number]  HPI This is a 71 yo male who presents today with pain and swelling of his scalp for two days. Had botox injections for headaches 04/28/17. Head is tender, has red area. No drainage, no increased size over last 24 hours, no fever, no fatigue. No known trauma.   Was seen 04/14/17 with tick bite, cellulitis. Symptoms resolved within two days. Did not have any rash.   Past Medical History:  Diagnosis Date  . CAD (coronary artery disease) 10/10/2011   NSTEMI with DES to the proximal RCA following flow wire evaluation. EF is normal.   . GERD (gastroesophageal reflux disease)   . H/O esophageal ulcer   . History of stomach ulcers   . Hyperlipidemia   . Hypertension   . Migraines    "quite often; maybe q 10d to 2 wk" (01/14/2014)  . Myocardial infarction ([place]) 10/2011  . OSA on CPAP    "wear it most of the time" (01/14/2014)  . 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 BACK SURGERY    . CERVICAL DISC SURGERY  1980's?  . CORONARY ANGIOPLASTY WITH STENT PLACEMENT  10/13/2011   DES to the proximal RCA. Dr Martinique  . FRACTIONAL FLOW RESERVE WIRE Right 10/13/2011   Procedure: FRACTIONAL FLOW RESERVE WIRE;  Surgeon: Peter M Martinique, MD;  Location[facility] C[facility] LAB;  Service: Cardiovascular;  Laterality: Right;  . HERNIA REPAIR    . INGUINAL HERNIA REPAIR  10/23/2012   Procedure: HERNIA REPAIR INGUINAL ADULT;  Surgeon: Odis Hollingshead, MD;  Location: [place];  Service: General;  Laterality: Left;  Left lower quadrant; left inguinal hernia repair with mesh  . INSERTION OF MESH  10/23/2012   Procedure: INSERTION OF MESH;  Surgeon: Odis Hollingshead, MD;  Location: [place];  Service: General;  Laterality: Left;  Left lower quadrant  . LEFT HEART CATHETERIZATION WITH CORONARY ANGIOGRAM N/A 10/13/2011   Procedure: LEFT HEART CATHETERIZATION WITH CORONARY  ANGIOGRAM;  Surgeon: Peter M Martinique, MD;  Location: [facility] CATH LAB;  Service: Cardiovascular;  Laterality: N/A;  . PERCUTANEOUS CORONARY STENT INTERVENTION (PCI-S) Right 10/13/2011   Procedure: PERCUTANEOUS CORONARY STENT INTERVENTION (PCI-S);  Surgeon: Peter M Martinique, MD;  Location: [facility] CATH LAB;  Service: Cardiovascular;  Laterality: Right;  . SHOULDER ARTHROSCOPY W/ ROTATOR CUFF REPAIR Left 1990's  . TRIGGER FINGER RELEASE Left    3rd and 4th digits   Family History  Problem Relation Age of Onset  . Cancer Mother        lung  . Heart disease Father        heart attack   . Cancer Father        colon   Social History  Substance Use Topics  . Smoking status: Former Smoker    Packs/day: 2.00    Years: 20.00    Types: Cigarettes    Quit date: 11/14/1982  . Smokeless tobacco: Never Used  . Alcohol use 3.6 oz/week    6 Cans of beer per week      Review of Systems Per HPI    Objective:   Physical Exam  Constitutional: He is oriented to person, place, and time. He appears well-developed and well-nourished. No distress.  HENT:  Head: Normocephalic.    Eyes: Conjunctivae are normal.  Cardiovascular: Normal rate.  Pulmonary/Chest: Effort normal.  Neurological: He is alert and oriented to person, place, and time.  Skin: Skin is warm and dry. He is not diaphoretic.  Psychiatric: He has a normal mood and affect. His behavior is normal. Judgment and thought content normal.  Vitals reviewed.     BP 134/64 (BP Location: Right Arm, Patient Position: Sitting, Cuff Size: Normal)   Pulse 61   Temp 97.7 F (36.5 C) (Oral)   Wt 212 lb 6.4 oz (96.3 kg)   SpO2 94%   BMI 28.02 kg/m  Wt Readings from Last 3 Encounters:  05/05/17 212 lb 6.4 oz (96.3 kg)  05/03/17 211 lb 9.6 oz (96 kg)  04/14/17 208 lb (94.3 kg)       Assessment & Plan:  1. Scalp irritation - unclear etiology, ? Mild trauma, ? Recent Botox injections for migraines - cool compresses/ acetaminophen - instructed  him to let me know if worsens or not better in 3-5 days  Clarene Reamer, FNP-BC  [facility] [place]  05/05/2017 10:55 AM

## 2017-05-05 NOTE — Patient Instructions (Signed)
Can apply ice/cool compress  Tylenol for pain

## 2017-05-10 ENCOUNTER — Telehealth: Payer: Self-pay | Admitting: Family Medicine

## 2017-05-10 NOTE — Telephone Encounter (Signed)
Wife calling to let Rachel Bo know she will be writing a formal complaint and mailing it to our office as well as [place].  Thank you,   -LL

## 2017-05-15 ENCOUNTER — Telehealth: Payer: Self-pay | Admitting: Family Medicine

## 2017-05-15 NOTE — Telephone Encounter (Signed)
ROI fax to [facility]

## 2017-05-24 ENCOUNTER — Encounter: Payer: Self-pay | Admitting: Family Medicine

## 2017-05-24 ENCOUNTER — Ambulatory Visit (INDEPENDENT_AMBULATORY_CARE_PROVIDER_SITE_OTHER): Payer: Medicare HMO | Admitting: Family Medicine

## 2017-05-24 DIAGNOSIS — M7072 Other bursitis of hip, left hip: Secondary | ICD-10-CM

## 2017-05-24 DIAGNOSIS — M7071 Other bursitis of hip, right hip: Secondary | ICD-10-CM

## 2017-05-24 DIAGNOSIS — M7070 Other bursitis of hip, unspecified hip: Secondary | ICD-10-CM

## 2017-05-24 MED ORDER — DICLOFENAC SODIUM 2 % TD SOLN: 2.0000 g | Freq: Two times a day (BID) | TRANSDERMAL | 3 refills | Status: DC

## 2017-05-24 NOTE — Progress Notes (Signed)
[name] [address], [place] [address] Phone: [phone] Subjective:    I'm seeing this patient by the request  of:  Briscoe Deutscher, DO   CC: buttocks pain f/u  [id number]  Robert [name] is a 71 y.o. male coming in with complaint of buttock pain. Found to have bilateral ischial bursitis. Patient has been seen and did have a repeat injection 03/01/2017 bilaterally. 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 Patient was 90-95% better previously. Patient states Unfortunately the pain is starting to come back again. States it is not as severe as what it's been previously but still not as good as it was right after the injection. Patient is having worsening pain even with regular daily walking. Seems even just more difficulty with standing for long amount of time. Denies any nighttime pain.     Past Medical History:  Diagnosis Date  . CAD (coronary artery disease) 10/10/2011   NSTEMI with DES to the proximal RCA following flow wire evaluation. EF is normal.   . GERD (gastroesophageal reflux disease)   . H/O esophageal ulcer   . History of stomach ulcers   . Hyperlipidemia   . Hypertension   . Migraines    "quite often; maybe q 10d to 2 wk" (01/14/2014)  . Myocardial infarction ([place]) 10/2011  . OSA on CPAP    "wear it most of the time" (01/14/2014)  . 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 BACK SURGERY    . CERVICAL DISC SURGERY  1980's?  . CORONARY ANGIOPLASTY WITH STENT PLACEMENT  10/13/2011   DES to the proximal RCA. Dr Martinique  . FRACTIONAL FLOW RESERVE WIRE Right 10/13/2011   Procedure: FRACTIONAL FLOW RESERVE WIRE;  Surgeon: Peter M Martinique, MD;  Location: [facility] CATH LAB;  Service: Cardiovascular;  Laterality: Right;  . HERNIA REPAIR    . INGUINAL HERNIA REPAIR  10/23/2012   Procedure: HERNIA REPAIR INGUINAL ADULT;  Surgeon: Odis Hollingshead, MD;  Location: [place];  Service: General;  Laterality: Left;  Left lower quadrant; left inguinal hernia repair with mesh  . INSERTION OF MESH  10/23/2012   Procedure: INSERTION OF MESH;  Surgeon: Odis Hollingshead, MD;  Location: [place];  Service: General;  Laterality: Left;  Left lower quadrant  . LEFT HEART CATHETERIZATION WITH CORONARY ANGIOGRAM N/A 10/13/2011   Procedure: LEFT HEART CATHETERIZATION WITH CORONARY ANGIOGRAM;  Surgeon: Peter M Martinique, MD;  Location: [facility] CATH LAB;  Service: Cardiovascular;  Laterality: N/A;  . PERCUTANEOUS CORONARY STENT INTERVENTION (PCI-S) Right 10/13/2011   Procedure: PERCUTANEOUS CORONARY STENT INTERVENTION (PCI-S);  Surgeon: Peter M Martinique, MD;  Location: [facility] CATH LAB;  Service: Cardiovascular;  Laterality: Right;  . SHOULDER ARTHROSCOPY W/ ROTATOR CUFF REPAIR Left 1990's  . TRIGGER FINGER RELEASE Left    3rd and 4th digits   Social History   Social History  . Marital status: Married    Spouse name: N/A  . Number of children: N/A  . Years of education: N/A   Social History Main Topics  . Smoking status: Former Smoker    Packs/day: 2.00    Years: 20.00    Types: Cigarettes    Quit date: 11/14/1982  . Smokeless tobacco: Never Used  . Alcohol use 3.6 oz/week    6 Cans of beer per week  . Drug use: No  .  Sexual activity: Not Currently   Other Topics Concern  . None   Social History Narrative   Pt lives with his wife. 1 and a half story home  No issues with stairs. 12th grade education      Allergies  Allergen Reactions  . Penicillins     itching  . Sumatriptan     REACTION: sob, headache scalp sensitivity  . Oxycodone Itching   Family History  Problem Relation Age of Onset  . Cancer Mother        lung  . Heart disease Father        heart attack   . Cancer Father        colon    Past medical history, social, surgical and family history all reviewed in electronic medical record.  No pertanent information unless stated  regarding to the chief complaint.   Review of Systems: No headache, visual changes, nausea, vomiting, diarrhea, constipation, dizziness, abdominal pain, skin rash, fevers, chills, night sweats, weight loss, swollen lymph nodes, body aches, joint swelling,chest pain, shortness of breath, mood changes.  Positive muscle aches  Objective  Blood pressure 128/70, pulse 62, height 6\' 1"  (1.854 m), weight 214 lb (97.1 kg), SpO2 96 %.   Systems examined below as of 05/24/17 General: NAD A&O x3 mood, affect normal  HEENT: Pupils equal, extraocular movements intact no nystagmus Respiratory: not short of breath at rest or with speaking Cardiovascular: No lower extremity edema, non tender Skin: Warm dry intact with no signs of infection or rash on extremities or on axial skeleton. Abdomen: Soft nontender, no masses Neuro: Cranial nerves  intact, neurovascularly intact in all extremities with 2+ DTRs and 2+ pulses. Lymph: No lymphadenopathy appreciated today  Gait mild tightness of the hamstrings bilaterally MSK: Non tender with full range of motion and good stability and symmetric strength and tone of shoulders, elbows, wrist,  knee hips and ankles bilaterally.  Arthritic changes of multiple joints  Back Exam:  Inspection: Loss of lordosis Motion: Flexion 30 deg, Extension 25 deg, Side Bending to 30 deg bilaterally,  Rotation to 30 deg bilaterally  SLR laying: Negative  XSLR laying: Negative  Palpable tenderness: Tender over the ischial bursa bilaterally. Tightness of the back noted. FABER: negative. Sensory change: Gross sensation intact to all lumbar and sacral dermatomes.  Reflexes: 2+ at both patellar tendons, 2+ at achilles tendons, Babinski's downgoing.  Strength at foot  Plantar-flexion: 5/5 Dorsi-flexion: 5/5 Eversion: 5/5 Inversion: 5/5  Leg strength  Quad: 5/5 Hamstring: 5/5 Hip flexor: 5/5 Hip abductors: 5/5  Gait unremarkable.   Procedure: Real-time Ultrasound Guided Injection of  right ischial bursa Device: GE Logiq Q7 Ultrasound guided injection is preferred based studies that show increased duration, increased effect, greater accuracy, decreased procedural pain, increased response rate, and decreased cost with ultrasound guided versus blind injection.  Verbal informed consent obtained.  Time-out conducted.  Noted no overlying erythema, induration, or other signs of local infection.  Skin prepped in a sterile fashion.  Local anesthesia: Topical Ethyl chloride.  With sterile technique and under real time ultrasound guidance: With a 21-gauge 2 inch needle patient was injected with a total of 1 mL of 0.5% Marcaine and 1 mL of Kenalog 40 mg/dL.   Completed without difficulty  Pain immediately resolved suggesting accurate placement of the medication.  Advised to call if fevers/chills, erythema, induration, drainage, or persistent bleeding.  Images permanently stored and available for review in the ultrasound unit.  Impression: Technically successful ultrasound guided injection.  Procedure: Real-time Ultrasound Guided Injection of left ischial bursa Device: GE Logiq Q7 Ultrasound guided injection is preferred based studies that show increased duration, increased effect, greater accuracy, decreased procedural pain, increased response rate, and decreased cost with ultrasound guided versus blind injection.  Verbal informed consent obtained.  Time-out conducted.  Noted no overlying erythema, induration, or other signs of local infection.  Skin prepped in a sterile fashion.  Local anesthesia: Topical Ethyl chloride.  With sterile technique and under real time ultrasound guidance: With a 21-gauge 2 and she will patient was injected with a total of 1 mL of 0.5% Marcaine and 1 mL of Kenalog 40 mg/dL.  Completed without difficulty  Pain immediately resolved suggesting accurate placement of the medication.  Advised to call if fevers/chills, erythema, induration, drainage, or  persistent bleeding.  Images permanently stored and available for review in the ultrasound unit.  Impression: Technically successful ultrasound guided injection.    Impression and Recommendations:     This case required medical decision making of moderate complexity.      Note: This dictation was prepared with Dragon dictation along with smaller phrase technology. Any transcriptional errors that result from this process are unintentional.

## 2017-05-24 NOTE — Assessment & Plan Note (Addendum)
Worsening symptoms. Bilateral injections given again today. Differential includes a lumbar radiculopathy with likely spinal stenosis causing tightness of the musculature. Patient states it felt better after the injection. Patient will continue on the gabapentin. We discussed the possibility of increasing to 300 mg. We discussed icing regimen. Patient will call in 2-3 weeks. If no significant improvement MRI of the back will be necessary.

## 2017-05-24 NOTE — Patient Instructions (Signed)
Good to see you  Ice is your friend I hope it helps and if it does not call me and lets get a MRI of the back.  Read about PRP injections as well.  See me again in 4-6 weeks otherwise.

## 2017-05-31 DIAGNOSIS — H2513 Age-related nuclear cataract, bilateral: Secondary | ICD-10-CM | POA: Diagnosis not present

## 2017-05-31 DIAGNOSIS — H2512 Age-related nuclear cataract, left eye: Secondary | ICD-10-CM | POA: Diagnosis not present

## 2017-05-31 DIAGNOSIS — H02831 Dermatochalasis of right upper eyelid: Secondary | ICD-10-CM | POA: Diagnosis not present

## 2017-05-31 DIAGNOSIS — H52223 Regular astigmatism, bilateral: Secondary | ICD-10-CM | POA: Diagnosis not present

## 2017-05-31 DIAGNOSIS — H2511 Age-related nuclear cataract, right eye: Secondary | ICD-10-CM | POA: Diagnosis not present

## 2017-07-05 ENCOUNTER — Ambulatory Visit: Payer: Medicare HMO | Admitting: Family Medicine

## 2017-07-05 DIAGNOSIS — H40051 Ocular hypertension, right eye: Secondary | ICD-10-CM | POA: Diagnosis not present

## 2017-07-05 DIAGNOSIS — H524 Presbyopia: Secondary | ICD-10-CM | POA: Diagnosis not present

## 2017-07-05 DIAGNOSIS — H40041 Steroid responder, right eye: Secondary | ICD-10-CM | POA: Diagnosis not present

## 2017-07-05 DIAGNOSIS — H2511 Age-related nuclear cataract, right eye: Secondary | ICD-10-CM | POA: Diagnosis not present

## 2017-07-05 DIAGNOSIS — H52221 Regular astigmatism, right eye: Secondary | ICD-10-CM | POA: Diagnosis not present

## 2017-07-05 DIAGNOSIS — Z961 Presence of intraocular lens: Secondary | ICD-10-CM | POA: Diagnosis not present

## 2017-07-13 ENCOUNTER — Ambulatory Visit (INDEPENDENT_AMBULATORY_CARE_PROVIDER_SITE_OTHER): Payer: Medicare HMO | Admitting: Family Medicine

## 2017-07-13 ENCOUNTER — Encounter: Payer: Self-pay | Admitting: Family Medicine

## 2017-07-13 DIAGNOSIS — M7070 Other bursitis of hip, unspecified hip: Secondary | ICD-10-CM

## 2017-07-13 DIAGNOSIS — M5136 Other intervertebral disc degeneration, lumbar region: Secondary | ICD-10-CM

## 2017-07-13 NOTE — Assessment & Plan Note (Signed)
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 Likely has some spinal stenosis that is contributing to some of the ischial bursitis. Discussed with patient again at great length. Doing very well. Using the gabapentin intermittently which I'm fine with. Patient will continue do the exercises and work on core strength. Patient will follow-up with me as needed.

## 2017-07-13 NOTE — Patient Instructions (Signed)
I am glad we are doing better [phone]

## 2017-07-13 NOTE — Progress Notes (Signed)
[name] [place], [place] [address] Phone: [phone] Subjective:    I'm seeing this patient by the request  of:  Briscoe Deutscher, DO   CC: buttocks pain f/u  OIT:[id number]  Robert [name] is a 71 y.o. male coming in with complaint of buttock pain. Found to have bilateral ischial bursitis. Patient has been seen and did have a repeat injection 03/01/2017 bilaterally. 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 Patient and repeat injections 05/24/2017 for the ischial bursitis is. Patient has been doing relatively well. Patient states no new pain overall. Still some discomfort from time to time but nothing severe. Has been doing the exercises fairly regularly and taking the vitamins.   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    Past Medical History:  Diagnosis Date  . CAD (coronary artery disease) 10/10/2011   NSTEMI with DES to the proximal RCA following flow wire evaluation. EF is normal.   . GERD (gastroesophageal reflux disease)   . H/O esophageal ulcer   . History of stomach ulcers   . Hyperlipidemia   . Hypertension   . Migraines    "quite often; maybe q 10d to 2 wk" (01/14/2014)  . Myocardial infarction ([place]) 10/2011  . OSA on CPAP    "wear it most of the time" (01/14/2014)  . 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 BACK SURGERY    . CERVICAL DISC SURGERY  1980's?  . CORONARY ANGIOPLASTY WITH STENT PLACEMENT  10/13/2011   DES to the proximal RCA. Dr Martinique  . FRACTIONAL FLOW RESERVE WIRE Right 10/13/2011   Procedure: FRACTIONAL FLOW RESERVE WIRE;  Surgeon: Peter M Martinique, MD;  Location: [facility] CATH LAB;  Service: Cardiovascular;  Laterality: Right;  . HERNIA REPAIR    . INGUINAL HERNIA REPAIR  10/23/2012   Procedure: HERNIA REPAIR INGUINAL ADULT;  Surgeon: Odis Hollingshead, MD;  Location: [place];  Service: General;  Laterality: Left;  Left lower quadrant; left inguinal hernia repair with mesh  . INSERTION OF MESH  10/23/2012   Procedure: INSERTION OF MESH;  Surgeon: Odis Hollingshead, MD;  Location: [place];  Service: General;  Laterality: Left;  Left lower quadrant  . LEFT HEART CATHETERIZATION WITH CORONARY ANGIOGRAM N/A 10/13/2011   Procedure: LEFT HEART CATHETERIZATION WITH CORONARY ANGIOGRAM;  Surgeon: Peter M Martinique, MD;  Location: [facility] CATH LAB;  Service: Cardiovascular;  Laterality: N/A;  . PERCUTANEOUS CORONARY STENT INTERVENTION (PCI-S) Right 10/13/2011   Procedure: PERCUTANEOUS CORONARY STENT INTERVENTION (PCI-S);  Surgeon: Peter M Martinique, MD;  Location: [facility] CATH LAB;  Service: Cardiovascular;  Laterality: Right;  . SHOULDER ARTHROSCOPY W/ ROTATOR CUFF REPAIR Left 1990's  . TRIGGER FINGER RELEASE Left    3rd and 4th digits   Social History   Social History  . Marital status: Married    Spouse name: N/A  . Number of children: N/A  . Years of education: N/A   Social History Main Topics  . Smoking status: Former Smoker    Packs/day: 2.00    Years: 20.00    Types: Cigarettes    Quit date: 11/14/1982  . Smokeless tobacco: Never Used  . Alcohol use 3.6 oz/week    6 Cans of beer per week  .  Drug use: No  . Sexual activity: Not Currently   Other Topics Concern  . None   Social History Narrative   Pt lives with his wife. 1 and a half story home  No issues with stairs. 12th grade education      Allergies  Allergen Reactions  . Penicillins     itching  . Sumatriptan     REACTION: sob, headache scalp sensitivity  . Oxycodone Itching   Family History  Problem Relation Age of Onset  . Cancer Mother        lung  . Heart disease Father        heart attack   . Cancer Father        colon    Past medical history, social, surgical and family history all reviewed in electronic  medical record.  No pertanent information unless stated regarding to the chief complaint.   Review of Systems: No headache, visual changes, nausea, vomiting, diarrhea, constipation, dizziness, abdominal pain, skin rash, fevers, chills, night sweats, weight loss, swollen lymph nodes, body aches, joint swelling,chest pain, shortness of breath, mood changes.  Positive muscle aches  Objective  Blood pressure 130/70, pulse 71, height 6\' 1"  (1.854 m), weight 212 lb (96.2 kg), SpO2 94 %.   Systems examined below as of 07/13/17 General: NAD A&O x3 mood, affect normal  HEENT: Pupils equal, extraocular movements intact no nystagmus Respiratory: not short of breath at rest or with speaking Cardiovascular: No lower extremity edema, non tender Skin: Warm dry intact with no signs of infection or rash on extremities or on axial skeleton. Abdomen: Soft nontender, no masses Neuro: Cranial nerves  intact, neurovascularly intact in all extremities with 2+ DTRs and 2+ pulses. Lymph: No lymphadenopathy appreciated today  Gait normal with good balance and coordination.  MSK: Non tender with full range of motion and good stability and symmetric strength and tone of shoulders, elbows, wrist,  knee hips and ankles bilaterally.  Arthritic changes of multiple joints  Back Exam:  Inspection: Loss of lordosis Motion: Flexion 35 deg, Extension 25 deg, Side Bending to 30 deg bilaterally,  Rotation to 30 deg bilaterally  SLR laying: Negative  XSLR laying: Negative  Palpable tenderness: Mild tenderness of the paraspinal musculature lumbar spine FABER: negative. Sensory change: Gross sensation intact to all lumbar and sacral dermatomes.  Reflexes: 2+ at both patellar tendons, 2+ at achilles tendons, Babinski's downgoing.  Strength at foot  Plantar-flexion: 5/5 Dorsi-flexion: 5/5 Eversion: 5/5 Inversion: 5/5  Leg strength  Quad: 5/5 Hamstring: 5/5 Hip flexor: 5/5 Hip abductors: 5/5  Gait unremarkable.       Impression and Recommendations:     This case required medical decision making of moderate complexity.      Note: This dictation was prepared with Dragon dictation along with smaller phrase technology. Any transcriptional errors that result from this process are unintentional.

## 2017-07-13 NOTE — Assessment & Plan Note (Signed)
Patient is responding fairly well after the injection. Follow-up again in 6 weeks to make sure he continues to do well. Encourage him to continue to increase activity. I do think that there is some radicular symptoms that is contributing. Encourage him to use the gabapentin at least on an as-needed basis.

## 2017-08-08 DIAGNOSIS — H5201 Hypermetropia, right eye: Secondary | ICD-10-CM | POA: Diagnosis not present

## 2017-08-08 DIAGNOSIS — H2512 Age-related nuclear cataract, left eye: Secondary | ICD-10-CM | POA: Diagnosis not present

## 2017-08-08 DIAGNOSIS — H524 Presbyopia: Secondary | ICD-10-CM | POA: Diagnosis not present

## 2017-08-08 DIAGNOSIS — H25012 Cortical age-related cataract, left eye: Secondary | ICD-10-CM | POA: Diagnosis not present

## 2017-08-08 DIAGNOSIS — Z961 Presence of intraocular lens: Secondary | ICD-10-CM | POA: Diagnosis not present

## 2017-10-11 NOTE — Telephone Encounter (Signed)
No records from [facility]

## 2017-11-14 HISTORY — PX: CATARACT EXTRACTION: SUR2

## 2017-11-30 ENCOUNTER — Encounter: Payer: Self-pay | Admitting: Family Medicine

## 2017-11-30 ENCOUNTER — Ambulatory Visit (INDEPENDENT_AMBULATORY_CARE_PROVIDER_SITE_OTHER): Payer: Medicare HMO | Admitting: Family Medicine

## 2017-11-30 VITALS — BP 138/78 | HR 72 | Temp 98.3°F

## 2017-11-30 DIAGNOSIS — Z23 Encounter for immunization: Secondary | ICD-10-CM | POA: Diagnosis not present

## 2017-11-30 DIAGNOSIS — S61214A Laceration without foreign body of right ring finger without damage to nail, initial encounter: Secondary | ICD-10-CM

## 2017-11-30 NOTE — Progress Notes (Signed)
    Subjective:  Robert [name] is a 72 y.o. male who presents today with a chief complaint of finger laceration.   HPI:  Finger laceration, acute issue Symptoms started about 30 minutes prior to arrival.  Laceration is located on the palmar aspect of his right ring finger.  Patient tripped while grilling and his hand went into the grill when he suffered a laceration.  Last tetanus shot was over 10 years ago.  No weakness or numbness.  ROS: Per HPI  Objective:  Physical Exam: BP 138/78 (BP Location: Left Arm, Patient Position: Sitting, Cuff Size: Normal)   Pulse 72   Temp 98.3 F (36.8 C) (Oral)   SpO2 95%   Gen: NAD, resting comfortably Skin: Approximately 2 cm laceration on the distal phalanx of right ring finger on volar aspect.  No foreign bodies noted.  Laceration does not cross joint line. Sensation to light touch intact distally. Normal distal cap refill. Able to flex and extend DIP joint.  Laceration repair procedure note Verbal consent was obtained.  Laceration described above was irrigated with approximately 250 cc of sterile normal saline.  Pressure was applied to the wound until hemostasis was achieved.  Dermabond tissue adhesive was then applied to the laceration for a total of 3 coats. Patient able to fully flex and extend DIP joint after dermabond had dried. Estimated blood loss approximately 10 cc.   Assessment/Plan:  Finger laceration As noted above, this was repaired via a tissue adhesive today.  Discussed warning signs for infection including fevers, chills, worsening pain, redness, and drainage at site.  Patient was given tetanus booster as his last was over 10 years ago.  Recommended Tylenol as needed for pain control.  Return precautions reviewed.  Follow-up as needed.  Algis Greenhouse. Jerline Pain, MD 12/01/2017 8:05 AM

## 2017-11-30 NOTE — Patient Instructions (Signed)
Tissue Adhesive Wound Care °Some cuts and wounds can be closed with skin glue (tissue adhesive). Skin glue holds the skin together and helps your wound heal faster. Skin glue goes away on its own as your wound gets better. °Follow these instructions at home: °Wound care ° °· Showers are allowed 24 hours after treatment. Do not soak the wound in water. Do not take baths, swim, or use hot tubs. Do not use soaps or creams on your wound. °· If a bandage (dressing) was put on the wound: °? Wash your hands with soap and water before you change your bandage. °? Change the bandage as often as told by your doctor. °? Leave skin glue in place. It will fall off on its own after 7-10 days. °? Keep the bandage dry. °· Do not scratch, rub, or pick at the skin glue. ° ° ° °· Do not put tape over the skin glue. The skin glue could come off when you take the tape off. °· Protect the wound from another injury. °· Protect the wound from sun and tanning beds. °General instructions °· Take over-the-counter and prescription medicines only as told by your doctor. °· Keep all follow-up visits as told by your doctor. This is important. °Get help right away if: °· Your wound is red, puffy (swollen), hot, or tender. °· You get a rash after the glue is put on. °· You have more pain in the wound. °· You have a red streak going away from the wound. °· You have yellowish-white fluid (pus) coming from the wound. °· You have more bleeding. °· You have a fever. °· You have chills and you start to shake. °· You notice a bad smell coming from the wound. °· Your wound or skin glue breaks open. °This information is not intended to replace advice given to you by your health care provider. Make sure you discuss any questions you have with your health care provider. °Document Released: 08/09/2008 Document Revised: 09/23/2016 Document Reviewed: 09/23/2016 °Elsevier Interactive Patient Education © 2017 Elsevier Inc. ° °

## 2017-12-01 ENCOUNTER — Encounter: Payer: Self-pay | Admitting: Family Medicine

## 2017-12-01 DIAGNOSIS — Z961 Presence of intraocular lens: Secondary | ICD-10-CM | POA: Diagnosis not present

## 2017-12-01 DIAGNOSIS — H40053 Ocular hypertension, bilateral: Secondary | ICD-10-CM | POA: Diagnosis not present

## 2018-01-26 ENCOUNTER — Encounter: Payer: Self-pay | Admitting: Family Medicine

## 2018-01-26 ENCOUNTER — Ambulatory Visit: Payer: Medicare HMO | Admitting: Family Medicine

## 2018-01-26 VITALS — BP 126/80 | HR 64 | Temp 98.2°F | Resp 12 | Ht 73.0 in | Wt 218.2 lb

## 2018-01-26 DIAGNOSIS — R739 Hyperglycemia, unspecified: Secondary | ICD-10-CM | POA: Diagnosis not present

## 2018-01-26 DIAGNOSIS — M25512 Pain in left shoulder: Secondary | ICD-10-CM

## 2018-01-26 DIAGNOSIS — R51 Headache: Secondary | ICD-10-CM

## 2018-01-26 DIAGNOSIS — R682 Dry mouth, unspecified: Secondary | ICD-10-CM | POA: Diagnosis not present

## 2018-01-26 DIAGNOSIS — R519 Headache, unspecified: Secondary | ICD-10-CM

## 2018-01-26 NOTE — Progress Notes (Signed)
ACUTE VISIT   HPI:  Chief Complaint  Patient presents with  . Headache    sx started 1 week ago  . Dry mouth  . Shoulder Pain    comes and goes only lasts 15 to 20 seconds    [name] is a 72 y.o. male, who is here today complaining of persistent frontal headache for the past week as well as dry mouth. He is concerned about possibility of diabetes been causing the symptoms.  He has history of migraine but this headache is not like his typical migraine.  Headache has been going on for "a good week", intermittent, dull pain, 5/10. He has not identified exacerbating or alleviating factors.  Mild nausea, not sure if it is related to headache.  He denies fever, chills, visual changes, vomiting, numbness, tingling, focal deficit, or gait abnormality.  He has been taking Tramadol as needed, which helped.   He is also reporting history of cervical OA, which sometimes causes occipital headache. History of allergic rhinitis, he is currently on Flonase nasal spray and Xyzal 5 mg daily, he uses nasal spray as needed. No significant nasal congestion and rhinorrhea.   Head CT 06/2014: Mild diffuse cortical atrophy. No acute intracranial abnormality seen.  He is also complaining of anterior left shoulder pain, intermittently, sudden onset and last for 10-15 seconds. He has not identified exacerbating or alleviating factors. He has no noted numbness or tingling of left upper extremity.  He denies any recent injury or unusual physical activity.  History of CAD.  He denies chest pain, diaphoresis, palpitations, or dizziness. No cough, wheezing, or dyspnea.  Review of Systems  Constitutional: Negative for activity change, appetite change, fatigue and fever.  HENT: Positive for postnasal drip. Negative for facial swelling, nosebleeds, sinus pressure, sore throat and trouble swallowing.   Eyes: Negative for redness and visual disturbance.  Respiratory: Negative for cough,  shortness of breath and wheezing.   Cardiovascular: Negative for chest pain, palpitations and leg swelling.  Gastrointestinal: Positive for nausea. Negative for abdominal pain and vomiting.       No changes in bowel habits.  Endocrine: Negative for polydipsia, polyphagia and polyuria.  Genitourinary: Negative for decreased urine volume, dysuria and hematuria.  Musculoskeletal: Positive for arthralgias and neck pain. Negative for gait problem.  Skin: Negative for rash.  Allergic/Immunologic: Positive for environmental allergies.  Neurological: Positive for headaches. Negative for dizziness, syncope, facial asymmetry, speech difficulty, weakness and numbness.  Psychiatric/Behavioral: Negative for confusion. The patient is nervous/anxious.       Current Outpatient Medications on File Prior to Visit  Medication Sig Dispense Refill  . aspirin EC 81 MG tablet Take 81 mg by mouth daily.    . Cholecalciferol (VITAMIN D) 2000 UNITS CAPS Take 1 capsule by mouth daily.    . cyclobenzaprine (FLEXERIL) 10 MG tablet Take 0.5-1 tablets (5-10 mg total) by mouth 3 (three) times daily as needed for muscle spasms. 30 tablet 0  . ezetimibe (ZETIA) 10 MG tablet Take 5 mg by mouth daily.    . fish oil-omega-3 fatty acids 1000 MG capsule Take 1 g by mouth daily.    Marland Kitchen levocetirizine (XYZAL) 5 MG tablet Take 5 mg by mouth every evening.     . meclizine (ANTIVERT) 12.5 MG tablet Take 1 tablet (12.5 mg total) by mouth 3 (three) times daily as needed for dizziness. 30 tablet 0  . metoprolol tartrate (LOPRESSOR) 25 MG tablet Take 12.5 mg by mouth daily.    Marland Kitchen  mometasone (NASONEX) 50 MCG/ACT nasal spray Place 2 sprays into the nose daily. Reported on 01/18/2016    . Multiple Vitamins-Minerals (MULTIVITAMINS THER. W/MINERALS) TABS Take 1 tablet by mouth daily.    . nitroGLYCERIN (NITROSTAT) 0.4 MG SL tablet Place 1 tablet (0.4 mg total) under the tongue every 5 (five) minutes as needed for chest pain. MAX 3 doses 25 tablet  3  . pantoprazole (PROTONIX) 40 MG tablet Take 1 tablet (40 mg total) by mouth daily. 90 tablet 3  . rosuvastatin (CRESTOR) 40 MG tablet Take 1 tablet (40 mg total) by mouth daily. 90 tablet 3  . traMADol (ULTRAM) 50 MG tablet Take 50 mg by mouth every 6 (six) hours as needed. For pain    . verapamil (CALAN) 40 MG tablet Take 1 tablet (40 mg total) by mouth 3 (three) times daily. 45 tablet 0  . vitamin C (ASCORBIC ACID) 500 MG tablet Take 500 mg by mouth daily.     No current facility-administered medications on file prior to visit.      Past Medical History:  Diagnosis Date  . CAD (coronary artery disease) 10/10/2011   NSTEMI with DES to the proximal RCA following flow wire evaluation. EF is normal.   . GERD (gastroesophageal reflux disease)   . H/O esophageal ulcer   . History of stomach ulcers   . Hyperlipidemia   . Hypertension   . Migraines    "quite often; maybe q 10d to 2 wk" (01/14/2014)  . Myocardial infarction ([place]) 10/2011  . OSA on CPAP    "wear it most of the time" (01/14/2014)  . 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 Penicillins     itching  . Sumatriptan     REACTION: sob, headache scalp sensitivity  . Oxycodone Itching    Social History   Socioeconomic History  . Marital status: Married    Spouse name: None  . Number of children: None  . Years of education: None  . Highest education level: None  Social Needs  . Financial resource strain: None  . Food insecurity - worry: None  . Food insecurity - inability: None  . Transportation needs - medical: None  . Transportation needs - non-medical: None  Occupational History  . None  Tobacco Use  . Smoking status: Former Smoker    Packs/day: 2.00    Years: 20.00    Pack years: 40.00    Types: Cigarettes    Last attempt to quit: 11/14/1982    Years since quitting: 35.2  . Smokeless tobacco: Never Used  Substance and Sexual Activity  . Alcohol use: Yes     Alcohol/week: 3.6 oz    Types: 6 Cans of beer per week  . Drug use: No  . Sexual activity: Not Currently  Other Topics Concern  . None  Social History Narrative   Pt lives with his wife. 1 and a half story home  No issues with stairs. 12th grade education    Vitals:   01/26/18 1505  BP: 126/80  Pulse: 64  Resp: 12  Temp: 98.2 F (36.8 C)  SpO2: 94%   Body mass index is 28.79 kg/m.   Physical Exam  Nursing note and vitals reviewed. Constitutional: He is oriented to person, place, and time. He appears well-developed. No distress.  HENT:  Head: Normocephalic and atraumatic.  Nose: Right sinus exhibits no maxillary sinus tenderness and no frontal sinus tenderness.  Left sinus exhibits no maxillary sinus tenderness and no frontal sinus tenderness.  Mouth/Throat: Oropharynx is clear and moist and mucous membranes are normal.  Eyes: Conjunctivae and EOM are normal. Pupils are equal, round, and reactive to light.  Neck: Neck supple.  Cardiovascular: Normal rate and regular rhythm.  No murmur heard. Respiratory: Effort normal and breath sounds normal. No respiratory distress.  GI: Soft. He exhibits no mass. There is no hepatomegaly. There is no tenderness.  Musculoskeletal: He exhibits no edema.       Left shoulder: He exhibits tenderness.       Cervical back: He exhibits decreased range of motion (Mild) and spasm. He exhibits no tenderness and no bony tenderness.       Arms: Left shoulder tenderness for palpation of anterior aspect, no significant limitation of range of motion and he does not elicits pain.  Lymphadenopathy:    He has no cervical adenopathy.  Neurological: He is alert and oriented to person, place, and time. He has normal strength. No cranial nerve deficit. Gait normal.  Reflex Scores:      Bicep reflexes are 2+ on the right side and 2+ on the left side.      Patellar reflexes are 2+ on the right side and 2+ on the left side. Skin: Skin is warm. No rash noted. No  erythema.  Psychiatric: His mood appears anxious.  Well-groomed, good eye contact.     ASSESSMENT AND PLAN:   Mr. Henrry was seen today for headache, dry mouth and shoulder pain.  Diagnoses and all orders for this visit:  Frontal headache  We discussed possible etiologies, neurologic evaluation today negative. ?  Allergies, tension headache and wants him to consider. He was clearly instructed about warning signs, I do not think brain imaging is necessary at this time. Follow-up with PCP in 2 weeks, before if needed.  -     Basic metabolic panel -     CBC with Differential/Platelet  Hyperglycemia  We discussed the importance of a healthy lifestyle for primary prevention. Further recommendations will be given according to hemoglobin A1c results.  -     Hemoglobin A1c  Dry mouth  No signs of dehydration on examination today. Could be caused or aggravated by mouth breathing or some of his medications. Adequate hydration recommended.  -     Hemoglobin A1c  Left shoulder pain, unspecified chronicity  Shoulder pain seems to be musculoskeletal. Other possible causes discussed, he has history of cervical radicular pain. Range of motion exercises and OTC topical icy hot or Aspercreme recommended. He follows with Dr. Tamala Julian, sports medicine.     Return in about 2 weeks (around 02/09/2018) for PCP.     -[name] was advised to seek immediate medical attention if sudden worsening symptoms.      Betty G. Martinique, MD  [facility]. [place] office.

## 2018-01-26 NOTE — Patient Instructions (Addendum)
  [name] I have seen you today for an acute visit.  A few things to remember from today's visit:   Hyperglycemia - Plan: Hemoglobin A1c, CANCELED: POCT glycosylated hemoglobin (Hb A1C)  Dry mouth - Plan: Hemoglobin A1c  Frontal headache - Plan: Basic metabolic panel, CBC  Left shoulder pain, unspecified chronicity  Headache ? Allergies,tension headache.  Icy hot on shoulder may help.    General Headache Without Cause A headache is pain or discomfort felt around the head or neck area. There are many causes and types of headaches. In some cases, the cause may not be found. Follow these instructions at home: Managing pain  Take over-the-counter and prescription medicines only as told by your doctor.  Lie down in a dark, quiet room when you have a headache.  If directed, apply ice to the head and neck area: ? Put ice in a plastic bag. ? Place a towel between your skin and the bag. ? Leave the ice on for 20 minutes, 2-3 times per day.  Use a heating pad or hot shower to apply heat to the head and neck area as told by your doctor.  Keep lights dim if bright lights bother you or make your headaches worse. Eating and drinking  Eat meals on a regular schedule.  Lessen how much alcohol you drink.  Lessen how much caffeine you drink, or stop drinking caffeine. General instructions  Keep all follow-up visits as told by your doctor. This is important.  Keep a journal to find out if certain things bring on headaches. For example, write down: ? What you eat and drink. ? How much sleep you get. ? Any change to your diet or medicines.  Relax by getting a massage or doing other relaxing activities.  Lessen stress.  Sit up straight. Do not tighten (tense) your muscles.  Do not use tobacco products. This includes cigarettes, chewing tobacco, or e-cigarettes. If you need help quitting, ask your doctor.  Exercise regularly as told by your doctor.  Get enough sleep.  This often means 7-9 hours of sleep. Contact a doctor if:  Your symptoms are not helped by medicine.  You have a headache that feels different than the other headaches.  You feel sick to your stomach (nauseous) or you throw up (vomit).  You have a fever. Get help right away if:  Your headache becomes really bad.  You keep throwing up.  You have a stiff neck.  You have trouble seeing.  You have trouble speaking.  You have pain in the eye or ear.  Your muscles are weak or you lose muscle control.  You lose your balance or have trouble walking.  You feel like you will pass out (faint) or you pass out.  You have confusion. This information is not intended to replace advice given to you by your health care provider. Make sure you discuss any questions you have with your health care provider. Document Released: 08/09/2008 Document Revised: 04/07/2016 Document Reviewed: 02/23/2015 Elsevier Interactive Patient Education  [employer].    In general please monitor for signs of worsening symptoms and seek immediate medical attention if any concerning.  If symptoms are not resolved in 1-2 weeks you should schedule a follow up appointment with your doctor, before if needed.  I hope you get better soon!

## 2018-01-27 LAB — CBC WITH DIFFERENTIAL/PLATELET
BASOS ABS: 43 {cells}/uL (ref 0–200)
Basophils Relative: 0.7 %
Eosinophils Absolute: 73 cells/uL (ref 15–500)
Eosinophils Relative: 1.2 %
HEMATOCRIT: 40.7 % (ref 38.5–50.0)
Hemoglobin: 14.1 g/dL (ref 13.2–17.1)
LYMPHS ABS: 2117 {cells}/uL (ref 850–3900)
MCH: 31.4 pg (ref 27.0–33.0)
MCHC: 34.6 g/dL (ref 32.0–36.0)
MCV: 90.6 fL (ref 80.0–100.0)
MPV: 9.1 fL (ref 7.5–12.5)
Monocytes Relative: 8.3 %
NEUTROS PCT: 55.1 %
Neutro Abs: 3361 cells/uL (ref 1500–7800)
Platelets: 172 10*3/uL (ref 140–400)
RBC: 4.49 10*6/uL (ref 4.20–5.80)
RDW: 13 % (ref 11.0–15.0)
Total Lymphocyte: 34.7 %
WBC: 6.1 10*3/uL (ref 3.8–10.8)
WBCMIX: 506 {cells}/uL (ref 200–950)

## 2018-01-27 LAB — BASIC METABOLIC PANEL
BUN: 16 mg/dL (ref 7–25)
CO2: 30 mmol/L (ref 20–32)
CREATININE: 1.02 mg/dL (ref 0.70–1.18)
Calcium: 9.5 mg/dL (ref 8.6–10.3)
Chloride: 103 mmol/L (ref 98–110)
Glucose, Bld: 150 mg/dL — ABNORMAL HIGH (ref 65–99)
POTASSIUM: 4.7 mmol/L (ref 3.5–5.3)
SODIUM: 140 mmol/L (ref 135–146)

## 2018-01-27 LAB — HEMOGLOBIN A1C
EAG (MMOL/L): 8.1 (calc)
Hgb A1c MFr Bld: 6.7 % of total Hgb — ABNORMAL HIGH (ref <5.7)
MEAN PLASMA GLUCOSE: 146 (calc)

## 2018-01-31 ENCOUNTER — Encounter: Payer: Self-pay | Admitting: Family Medicine

## 2018-03-08 ENCOUNTER — Encounter: Payer: Self-pay | Admitting: Cardiology

## 2018-03-14 ENCOUNTER — Ambulatory Visit: Payer: Medicare HMO | Admitting: Cardiology

## 2018-03-14 ENCOUNTER — Encounter: Payer: Self-pay | Admitting: Cardiology

## 2018-03-14 VITALS — BP 118/60 | HR 52 | Ht 73.0 in | Wt 213.0 lb

## 2018-03-14 DIAGNOSIS — E782 Mixed hyperlipidemia: Secondary | ICD-10-CM

## 2018-03-14 DIAGNOSIS — I2510 Atherosclerotic heart disease of native coronary artery without angina pectoris: Secondary | ICD-10-CM | POA: Diagnosis not present

## 2018-03-14 DIAGNOSIS — I10 Essential (primary) hypertension: Secondary | ICD-10-CM

## 2018-03-14 LAB — HEPATIC FUNCTION PANEL
ALBUMIN: 4.7 g/dL (ref 3.5–4.8)
ALK PHOS: 65 IU/L (ref 39–117)
ALT: 27 IU/L (ref 0–44)
AST: 26 IU/L (ref 0–40)
Bilirubin Total: 0.5 mg/dL (ref 0.0–1.2)
Bilirubin, Direct: 0.15 mg/dL (ref 0.00–0.40)
TOTAL PROTEIN: 7.1 g/dL (ref 6.0–8.5)

## 2018-03-14 LAB — LIPID PANEL W/O CHOL/HDL RATIO
Cholesterol, Total: 156 mg/dL (ref 100–199)
HDL: 61 mg/dL (ref >39)
LDL CALC: 74 mg/dL (ref 0–99)
TRIGLYCERIDES: 103 mg/dL (ref 0–149)
VLDL Cholesterol Cal: 21 mg/dL (ref 5–40)

## 2018-03-14 NOTE — Patient Instructions (Signed)
Continue your current therapy  I will see you in one year   

## 2018-03-14 NOTE — Addendum Note (Signed)
Addended by: Kathyrn Lass on: 03/14/2018 09:25 AM   Modules accepted: Orders

## 2018-03-14 NOTE — Progress Notes (Signed)
Robert [name] Date of Birth: Jan 03, 1946 Medical Record #[id number]  History of Present Illness: Mr. Prochnow is seen today for follow up CAD.  He has had a  NSTEMI with DES to the proximal RCA in November 2012. EF was normal.  Stress Myoview in March 2015 was Normal. 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  He is doing very well on follow up. He denies any chest pain or dyspnea. He remains  active. His [place] passed away. He does travel back and forth to [place] [employer] where he has a place.  Current Outpatient Medications on File Prior to Visit  Medication Sig Dispense Refill  . aspirin EC 81 MG tablet Take 81 mg by mouth daily.    . Cholecalciferol (VITAMIN D) 2000 UNITS CAPS Take 1 capsule by mouth daily.    . cyclobenzaprine (FLEXERIL) 10 MG tablet Take 0.5-1 tablets (5-10 mg total) by mouth 3 (three) times daily as needed for muscle spasms. 30 tablet 0  . ezetimibe (ZETIA) 10 MG tablet Take 5 mg by mouth daily.    . fish oil-omega-3 fatty acids 1000 MG capsule Take 1 g by mouth daily.    Marland Kitchen levocetirizine (XYZAL) 5 MG tablet Take 5 mg by mouth every evening.     . meclizine (ANTIVERT) 12.5 MG tablet Take 1 tablet (12.5 mg total) by mouth 3 (three) times daily as needed for dizziness. 30 tablet 0  . metoprolol tartrate (LOPRESSOR) 25 MG tablet Take 12.5 mg by mouth daily.    . mometasone (NASONEX) 50 MCG/ACT nasal spray Place 2 sprays into the nose daily. Reported on 01/18/2016    . Multiple Vitamins-Minerals (MULTIVITAMINS THER. W/MINERALS) TABS Take 1 tablet by mouth daily.    . nitroGLYCERIN (NITROSTAT) 0.4 MG SL tablet Place 1 tablet (0.4 mg total) under the tongue every 5 (five) minutes as needed for chest pain. MAX 3 doses 25 tablet 3  . pantoprazole (PROTONIX) 40 MG tablet Take 1 tablet (40 mg total) by mouth daily. 90 tablet 3  . rosuvastatin (CRESTOR) 40 MG tablet Take 1 tablet (40 mg total) by mouth daily. 90 tablet 3  . traMADol  (ULTRAM) 50 MG tablet Take 50 mg by mouth every 6 (six) hours as needed. For pain    . verapamil (CALAN) 40 MG tablet Take 1 tablet (40 mg total) by mouth 3 (three) times daily. 45 tablet 0  . vitamin C (ASCORBIC ACID) 500 MG tablet Take 500 mg by mouth daily.     No current facility-administered medications on file prior to visit.     Allergies  Allergen Reactions  . Penicillins     itching  . Sumatriptan     REACTION: sob, headache scalp sensitivity  . Oxycodone Itching    Past Medical History:  Diagnosis Date  . CAD (coronary artery disease) 10/10/2011   NSTEMI with DES to the proximal RCA following flow wire evaluation. EF is normal.   . GERD (gastroesophageal reflux disease)   . H/O esophageal ulcer   . History of stomach ulcers   . Hyperlipidemia   . Hypertension   . Migraines    "quite often; maybe q 10d to 2 wk" (01/14/2014)  . Myocardial infarction ([place]) 10/2011  . OSA on CPAP    "wear it most of the time" (01/14/2014)  . 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  BACK SURGERY    . CERVICAL DISC SURGERY  1980's?  . CORONARY ANGIOPLASTY WITH STENT PLACEMENT  10/13/2011   DES to the proximal RCA. Dr Martinique  . FRACTIONAL FLOW RESERVE WIRE Right 10/13/2011   Procedure: FRACTIONAL FLOW RESERVE WIRE;  Surgeon: Peter M Martinique, MD;  Location: [facility] CATH LAB;  Service: Cardiovascular;  Laterality: Right;  . HERNIA REPAIR    . INGUINAL HERNIA REPAIR  10/23/2012   Procedure: HERNIA REPAIR INGUINAL ADULT;  Surgeon: Odis Hollingshead, MD;  Location: [facility];  Service: General;  Laterality: Left;  Left lower quadrant; left inguinal hernia repair with mesh  . INSERTION OF MESH  10/23/2012   Procedure: INSERTION OF MESH;  Surgeon: Odis Hollingshead, MD;  Location: [place];  Service: General;  Laterality: Left;  Left lower quadrant  . LEFT HEART CATHETERIZATION WITH CORONARY ANGIOGRAM N/A 10/13/2011   Procedure: LEFT HEART  CATHETERIZATION WITH CORONARY ANGIOGRAM;  Surgeon: Peter M Martinique, MD;  Location: [facility] CATH LAB;  Service: Cardiovascular;  Laterality: N/A;  . PERCUTANEOUS CORONARY STENT INTERVENTION (PCI-S) Right 10/13/2011   Procedure: PERCUTANEOUS CORONARY STENT INTERVENTION (PCI-S);  Surgeon: Peter M Martinique, MD;  Location: [facility] CATH LAB;  Service: Cardiovascular;  Laterality: Right;  . SHOULDER ARTHROSCOPY W/ ROTATOR CUFF REPAIR Left 1990's  . TRIGGER FINGER RELEASE Left    3rd and 4th digits    Social History   Tobacco Use  Smoking Status Former Smoker  . Packs/day: 2.00  . Years: 20.00  . Pack years: 40.00  . Types: Cigarettes  . Last attempt to quit: 11/14/1982  . Years since quitting: 35.3  Smokeless Tobacco Never Used    Social History   Substance and Sexual Activity  Alcohol Use Yes  . Alcohol/week: 3.6 oz  . Types: 6 Cans of beer per week    Family History  Problem Relation Age of Onset  . Cancer Mother        lung  . Heart disease Father        heart attack   . Cancer Father        colon    Review of Systems: The review of systems is per the HPI.  All other systems were reviewed and are negative.  Physical Exam: BP 118/60   Pulse (!) 52   Ht 6\' 1"  (1.854 m)   Wt 213 lb (96.6 kg)   BMI 28.10 kg/m  GENERAL:  Well appearing WM in NAD HEENT:  PERRL, EOMI, sclera are clear. Oropharynx is clear. NECK:  No jugular venous distention, carotid upstroke brisk and symmetric, no bruits, no thyromegaly or adenopathy LUNGS:  Clear to auscultation bilaterally CHEST:  Unremarkable HEART:  RRR,  PMI not displaced or sustained,S1 and S2 within normal limits, no S3, no S4: no clicks, no rubs, no murmurs ABD:  Soft, nontender. BS +, no masses or bruits. No hepatomegaly, no splenomegaly EXT:  2 + pulses throughout, no edema, no cyanosis no clubbing SKIN:  Warm and dry.  No rashes NEURO:  Alert and oriented x 3. Cranial nerves II through XII intact. PSYCH:  Cognitively  intact     LABORATORY DATA:  Lab Results  Component Value Date   WBC 6.1 01/26/2018   HGB 14.1 01/26/2018   HCT 40.7 01/26/2018   PLT 172 01/26/2018   GLUCOSE 150 (H) 01/26/2018   CHOL 149 04/28/2017   TRIG 109 04/28/2017   HDL 68 04/28/2017   LDLDIRECT 137.0 03/31/2010   LDLCALC 59 04/28/2017  ALT 28 04/28/2017   AST 23 04/28/2017   NA 140 01/26/2018   K 4.7 01/26/2018   CL 103 01/26/2018   CREATININE 1.02 01/26/2018   BUN 16 01/26/2018   CO2 30 01/26/2018   TSH 1.70 07/05/2014   PSA 0.87 03/31/2010   INR 0.89 01/14/2014   HGBA1C 6.7 (H) 01/26/2018   MICROALBUR 0.50 10/21/2011    Ecg: today- NSR rate 52. Normal Ecg.  I have personally reviewed and interpreted this study.   Assessment / Plan: 1. Coronary artery disease status post drug-eluting stent to the proximal RCA in November 2012. Myoview March 2015 was normal. He remains asymptomatic. Continue ASA, statin, metoprolol. Follow up in one year.  2. Hyperlipidemia. Continue crestor and Zetia- will check fasting lab work today.  3. Hypertension well controlled.

## 2018-03-15 ENCOUNTER — Telehealth: Payer: Self-pay | Admitting: Cardiology

## 2018-03-15 NOTE — Telephone Encounter (Signed)
Returned call to pt and informed of lab results. Pt verbalized understanding.

## 2018-03-15 NOTE — Telephone Encounter (Signed)
New Message ° ° ° °Patient is returning call in reference to labs. Please call.  °

## 2018-03-20 ENCOUNTER — Ambulatory Visit: Payer: Medicare HMO | Admitting: Cardiology

## 2018-03-20 DIAGNOSIS — M47812 Spondylosis without myelopathy or radiculopathy, cervical region: Secondary | ICD-10-CM | POA: Diagnosis not present

## 2018-03-22 ENCOUNTER — Ambulatory Visit: Payer: Medicare HMO | Admitting: Cardiology

## 2018-04-04 DIAGNOSIS — M50221 Other cervical disc displacement at C4-C5 level: Secondary | ICD-10-CM | POA: Diagnosis not present

## 2018-04-04 DIAGNOSIS — M791 Myalgia, unspecified site: Secondary | ICD-10-CM | POA: Diagnosis not present

## 2018-04-04 DIAGNOSIS — M50321 Other cervical disc degeneration at C4-C5 level: Secondary | ICD-10-CM | POA: Diagnosis not present

## 2018-04-04 DIAGNOSIS — R51 Headache: Secondary | ICD-10-CM | POA: Diagnosis not present

## 2018-04-04 DIAGNOSIS — M542 Cervicalgia: Secondary | ICD-10-CM | POA: Diagnosis not present

## 2018-04-11 DIAGNOSIS — R51 Headache: Secondary | ICD-10-CM | POA: Diagnosis not present

## 2018-04-11 DIAGNOSIS — M791 Myalgia, unspecified site: Secondary | ICD-10-CM | POA: Diagnosis not present

## 2018-04-11 DIAGNOSIS — M50221 Other cervical disc displacement at C4-C5 level: Secondary | ICD-10-CM | POA: Diagnosis not present

## 2018-04-11 DIAGNOSIS — M542 Cervicalgia: Secondary | ICD-10-CM | POA: Diagnosis not present

## 2018-04-11 DIAGNOSIS — M50321 Other cervical disc degeneration at C4-C5 level: Secondary | ICD-10-CM | POA: Diagnosis not present

## 2018-04-11 DIAGNOSIS — R69 Illness, unspecified: Secondary | ICD-10-CM | POA: Diagnosis not present

## 2018-04-16 DIAGNOSIS — M542 Cervicalgia: Secondary | ICD-10-CM | POA: Diagnosis not present

## 2018-04-16 DIAGNOSIS — M50321 Other cervical disc degeneration at C4-C5 level: Secondary | ICD-10-CM | POA: Diagnosis not present

## 2018-04-16 DIAGNOSIS — M50221 Other cervical disc displacement at C4-C5 level: Secondary | ICD-10-CM | POA: Diagnosis not present

## 2018-04-16 DIAGNOSIS — R51 Headache: Secondary | ICD-10-CM | POA: Diagnosis not present

## 2018-04-16 DIAGNOSIS — M791 Myalgia, unspecified site: Secondary | ICD-10-CM | POA: Diagnosis not present

## 2018-04-18 ENCOUNTER — Emergency Department (HOSPITAL_BASED_OUTPATIENT_CLINIC_OR_DEPARTMENT_OTHER): Payer: Medicare HMO

## 2018-04-18 ENCOUNTER — Encounter (HOSPITAL_BASED_OUTPATIENT_CLINIC_OR_DEPARTMENT_OTHER): Payer: Self-pay

## 2018-04-18 ENCOUNTER — Emergency Department (HOSPITAL_BASED_OUTPATIENT_CLINIC_OR_DEPARTMENT_OTHER)
Admission: EM | Admit: 2018-04-18 | Discharge: 2018-04-18 | Disposition: A | Discharge status: Home / Self Care | Payer: Medicare HMO | Attending: Emergency Medicine | Admitting: Emergency Medicine

## 2018-04-18 ENCOUNTER — Other Ambulatory Visit: Payer: Self-pay

## 2018-04-18 DIAGNOSIS — Z955 Presence of coronary angioplasty implant and graft: Secondary | ICD-10-CM | POA: Diagnosis not present

## 2018-04-18 DIAGNOSIS — I252 Old myocardial infarction: Secondary | ICD-10-CM | POA: Insufficient documentation

## 2018-04-18 DIAGNOSIS — I10 Essential (primary) hypertension: Secondary | ICD-10-CM | POA: Diagnosis not present

## 2018-04-18 DIAGNOSIS — Y9389 Activity, other specified: Secondary | ICD-10-CM | POA: Diagnosis not present

## 2018-04-18 DIAGNOSIS — I2510 Atherosclerotic heart disease of native coronary artery without angina pectoris: Secondary | ICD-10-CM | POA: Diagnosis not present

## 2018-04-18 DIAGNOSIS — Z87891 Personal history of nicotine dependence: Secondary | ICD-10-CM | POA: Insufficient documentation

## 2018-04-18 DIAGNOSIS — W1789XA Other fall from one level to another, initial encounter: Secondary | ICD-10-CM | POA: Diagnosis not present

## 2018-04-18 DIAGNOSIS — R079 Chest pain, unspecified: Secondary | ICD-10-CM | POA: Diagnosis not present

## 2018-04-18 DIAGNOSIS — S32009A Unspecified fracture of unspecified lumbar vertebra, initial encounter for closed fracture: Secondary | ICD-10-CM | POA: Diagnosis not present

## 2018-04-18 DIAGNOSIS — Z7982 Long term (current) use of aspirin: Secondary | ICD-10-CM | POA: Insufficient documentation

## 2018-04-18 DIAGNOSIS — Y998 Other external cause status: Secondary | ICD-10-CM | POA: Insufficient documentation

## 2018-04-18 DIAGNOSIS — S32028A Other fracture of second lumbar vertebra, initial encounter for closed fracture: Secondary | ICD-10-CM | POA: Diagnosis not present

## 2018-04-18 DIAGNOSIS — Z79899 Other long term (current) drug therapy: Secondary | ICD-10-CM | POA: Diagnosis not present

## 2018-04-18 DIAGNOSIS — W19XXXA Unspecified fall, initial encounter: Secondary | ICD-10-CM

## 2018-04-18 DIAGNOSIS — Y92008 Other place in unspecified non-institutional (private) residence as the place of occurrence of the external cause: Secondary | ICD-10-CM | POA: Insufficient documentation

## 2018-04-18 DIAGNOSIS — Z85828 Personal history of other malignant neoplasm of skin: Secondary | ICD-10-CM | POA: Insufficient documentation

## 2018-04-18 DIAGNOSIS — S3992XA Unspecified injury of lower back, initial encounter: Secondary | ICD-10-CM | POA: Diagnosis not present

## 2018-04-18 DIAGNOSIS — M545 Low back pain: Secondary | ICD-10-CM | POA: Diagnosis not present

## 2018-04-18 DIAGNOSIS — S299XXA Unspecified injury of thorax, initial encounter: Secondary | ICD-10-CM | POA: Diagnosis not present

## 2018-04-18 MED ORDER — HYDROCODONE-ACETAMINOPHEN 5-325 MG PO TABS: 1.0000 | ORAL_TABLET | Freq: Four times a day (QID) | ORAL | 0 refills | Status: DC | PRN

## 2018-04-18 MED ORDER — IBUPROFEN 800 MG PO TABS: 800.0000 mg | ORAL_TABLET | Freq: Once | ORAL | Status: DC

## 2018-04-18 MED ORDER — ACETAMINOPHEN 500 MG PO TABS
1000.0000 mg | ORAL_TABLET | Freq: Once | ORAL | Status: AC
  Administered 2018-04-18: 1000 mg via ORAL
  Filled 2018-04-18: qty 2

## 2018-04-18 MED ORDER — CYCLOBENZAPRINE HCL 10 MG PO TABS: 10.0000 mg | ORAL_TABLET | Freq: Two times a day (BID) | ORAL | 0 refills | Status: DC | PRN

## 2018-04-18 MED ORDER — DICLOFENAC SODIUM 1 % TD GEL: 4.0000 g | Freq: Four times a day (QID) | TRANSDERMAL | 1 refills | Status: DC

## 2018-04-18 MED ORDER — HYDROCODONE-ACETAMINOPHEN 5-325 MG PO TABS
1.0000 | ORAL_TABLET | Freq: Once | ORAL | Status: AC
  Administered 2018-04-18: 1 via ORAL
  Filled 2018-04-18: qty 1

## 2018-04-18 MED ORDER — LIDOCAINE 5 % EX PTCH: 1.0000 | MEDICATED_PATCH | CUTANEOUS | 0 refills | Status: DC

## 2018-04-18 NOTE — ED Notes (Signed)
Patient transported to X-ray 

## 2018-04-18 NOTE — Discharge Instructions (Addendum)
Use the Vicodin as needed for pain and the Flexeril for spasm.  Make sure you space them at least 4 hours apart so they do not cause excess drowsiness.  Use the Lidoderm patches and Voltaren gel for additional pain control.  Make sure you are taking a stool softener to ensure you do not become constipated.  Return immediately if you start noticing numbness or weakness of your legs

## 2018-04-18 NOTE — ED Notes (Signed)
Dr. Plunkett at the bedside.  

## 2018-04-18 NOTE — ED Notes (Signed)
Patient transported to CT 

## 2018-04-18 NOTE — ED Provider Notes (Signed)
[facility] EM[facility]GENCY DEPARTMENT Provider Note  CSN: [id number] Arrival date & time: 04/18/18  1815  History   Chief Complaint Chief Complaint  Patient presents with  . Fall   HPI Robert [name] is a 72 y.o. male with a medical history of HLD, HTN, NSTEMI and DDD who presented to the ED following a fall. Patient slipped on his wet deck and landed directly on his back and one of the steps broke due to the impact. Patient currently endorses lower back and anterior chest wall pain. He is able to ambulate, but it is painful for him to turn when lying on his back. Denies head trauma or LOC during the fall. Denies paresthesias, weakness or coordination/balance issues. States it is painful to ambulate. Denies bowel or urinary dysfunction. Patient has taken 2 Tramadol prior to coming to the ED. Patient had past surgery on his neck for cervical spondylosis and endorses arthritis in his low back, but no lumbar spine surgeries.    Past Medical History:  Diagnosis Date  . CAD (coronary artery disease) 10/10/2011   NSTEMI with DES to the proximal RCA following flow wire evaluation. EF is normal.   . GERD (gastroesophageal reflux disease)   . H/O esophageal ulcer   . History of stomach ulcers   . Hyperlipidemia   . Hypertension   . Migraines    "quite often; maybe q 10d to 2 wk" (01/14/2014)  . Myocardial infarction ([place]) 10/2011  . OSA on CPAP    "wear it most of the time" (01/14/2014)  . 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 DDD (degenerative disc disease), lumbar 07/13/2017  . Squamous cell carcinoma 12/17/2016  . BMI 28.0-28.9,adult 12/17/2016  . Bursitis 11/28/2016  . Ischial bursitis 11/28/2016  . Migraines 11/28/2016  . Prediabetes 11/27/2016  . Cervicogenic headache 09/23/2016  . Cervical radiculopathy 09/23/2016  . Degenerative disc disease, cervical 09/23/2016  . Pain syndrome, chronic 09/23/2016  .  Cervical spondylolysis 06/20/2016  . Primary osteoarthritis of right hand 03/21/2016  . 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 Acquired trigger finger 10/28/2014  . Dizziness and giddiness 07/05/2014  . Flexor tenosynovitis of finger 06/23/2014  . S/P drug eluting coronary stent placement 04/08/2014  . NSTEMI (non-ST elevated myocardial infarction) ([place]) 10/13/2011  . HTN (hypertension) 10/11/2011  . CAD - RCA DES Nov 2012 10/10/2011  . Inguinal hernia without mention of obstruction or gangrene, unilateral or unspecified, (not specified as recurrent), left 07/27/2011  . DRY EYE SYNDROME 01/29/2010  . Chronic headache disorder 01/29/2010  . SKIN CANCER, HX OF 03/03/2009  . HYPERLIPIDEMIA 01/11/2008  . Allergic rhinitis 01/07/2008  . PROSTATITIS, RECURRENT 01/07/2008  . GASTRIC ULCER, HX OF 01/07/2008  . COLONIC POLYPS, HX OF 01/07/2008  . GERD 04/02/2007  . SLEEP APNEA 04/02/2007    Past Surgical History:  Procedure Laterality Date  . BACK SURGERY    . CERVICAL DISC SURGERY  1980's?  . CORONARY ANGIOPLASTY WITH STENT PLACEMENT  10/13/2011   DES to the proximal RCA. Dr Martinique  . FRACTIONAL FLOW RESERVE WIRE Right 10/13/2011   Procedure: FRACTIONAL FLOW RESERVE WIRE;  Surgeon: Peter M Martinique, MD;  Location: [facility] CATH LAB;  Service: Cardiovascular;  Laterality: Right;  . HERNIA REPAIR    . INGUINAL HERNIA REPAIR  10/23/2012   Procedure: HERNIA REPAIR INGUINAL ADULT;  Surgeon: Odis Hollingshead, MD;  Location: [facility] [place];  Service: General;  Laterality: Left;  Left lower quadrant; left inguinal hernia repair with mesh  . INSERTION OF MESH  10/23/2012   Procedure: INSERTION OF MESH;  Surgeon: Odis Hollingshead, MD;  Location: [facility];  Service: General;  Laterality: Left;  Left lower quadrant  . LEFT HEART CATHETERIZATION WITH CORONARY ANGIOGRAM N/A 10/13/2011   Procedure: LEFT HEART CATHETERIZATION WITH CORONARY ANGIOGRAM;  Surgeon: Peter M Martinique, MD;  Location[facility] CATH LAB;   Service: Cardiovascular;  Laterality: N/A;  . PERCUTANEOUS CORONARY STENT INTERVENTION (PCI-S) Right 10/13/2011   Procedure: PERCUTANEOUS CORONARY STENT INTERVENTION (PCI-S);  Surgeon: Peter M Martinique, MD;  Location: [facility] C[facility] LAB;  Service: Cardiovascular;  Laterality: Right;  . SHOULDER ARTHROSCOPY W/ ROTATOR CUFF REPAIR Left 1990's  . TRIGGER FINGER RELEASE Left    3rd and 4th digits     Home Medications    Prior to Admission medications   Medication Sig Start Date End Date Taking? Authorizing Provider  aspirin EC 81 MG tablet Take 81 mg by mouth daily.    [provider]  Cholecalciferol (VITAMIN D) 2000 UNITS CAPS Take 1 capsule by mouth daily.    [provider]  cyclobenzaprine (FLEXERIL) 10 MG tablet Take 0.5-1 tablets (5-10 mg total) by mouth 3 (three) times daily as needed for muscle spasms. 06/20/16   Golden Circle, FNP  ezetimibe (ZETIA) 10 MG tablet Take 5 mg by mouth daily.    [provider]  fish oil-omega-3 fatty acids 1000 MG capsule Take 1 g by mouth daily.    [provider]  levocetirizine (XYZAL) 5 MG tablet Take 5 mg by mouth every evening.  06/08/11   [provider]  meclizine (ANTIVERT) 12.5 MG tablet Take 1 tablet (12.5 mg total) by mouth 3 (three) times daily as needed for dizziness. 07/05/14   Lucille Passy, MD  metoprolol tartrate (LOPRESSOR) 25 MG tablet Take 12.5 mg by mouth daily. 10/26/11   Burtis Junes, NP  mometasone (NASONEX) 50 MCG/ACT nasal spray Place 2 sprays into the nose daily. Reported on 01/18/2016    [provider]  Multiple Vitamins-Minerals (MULTIVITAMINS THER. W/MINERALS) TABS Take 1 tablet by mouth daily.    [provider]  nitroGLYCERIN (NITROSTAT) 0.4 MG SL tablet Place 1 tablet (0.4 mg total) under the tongue every 5 (five) minutes as needed for chest pain. MAX 3 doses 01/28/16   Martinique, Peter M, MD  pantoprazole (PROTONIX) 40 MG tablet Take 1 tablet (40 mg total) by mouth daily.  10/26/11   Burtis Junes, NP  rosuvastatin (CRESTOR) 40 MG tablet Take 1 tablet (40 mg total) by mouth daily. 01/27/17   Martinique, Peter M, MD  traMADol (ULTRAM) 50 MG tablet Take 50 mg by mouth every 6 (six) hours as needed. For pain    [provider]  verapamil (CALAN) 40 MG tablet Take 1 tablet (40 mg total) by mouth 3 (three) times daily. 03/27/14   Hendricks Limes, MD  vitamin C (ASCORBIC ACID) 500 MG tablet Take 500 mg by mouth daily.    [provider]    Family History Family History  Problem Relation Age of Onset  . Cancer Mother        lung  . Heart disease Father        heart attack   . Cancer Father        colon    Social History Social History   Tobacco Use  . Smoking status: Former  Smoker    Packs/day: 2.00    Years: 20.00    Pack years: 40.00    Types: Cigarettes    Last attempt to quit: 11/14/1982    Years since quitting: 35.4  . Smokeless tobacco: Never Used  Substance Use Topics  . Alcohol use: Yes    Alcohol/week: 3.6 oz    Types: 6 Cans of beer per week  . Drug use: No     Allergies   Penicillins; Sumatriptan; and Oxycodone   Review of Systems Review of Systems  Constitutional: Negative for chills and fever.  Respiratory: Negative for cough, chest tightness and shortness of breath.   Cardiovascular: Negative for palpitations and leg swelling.  Gastrointestinal:       Negative bowel dysfunction or changes.   Genitourinary: Negative for difficulty urinating.  Musculoskeletal: Positive for arthralgias and back pain. Negative for gait problem, neck pain and neck stiffness.  Skin:       Horizontal bruising on lumbar and thoracic back.  Neurological: Negative for dizziness, tremors, syncope, weakness, light-headedness and numbness.  Hematological: Does not bruise/bleed easily.     Physical Exam Updated Vital Signs BP (!) 143/63 (BP Location: Left Arm)   Pulse 63   Temp 97.9 F (36.6 C) (Oral)   Resp 18   Ht 6\' 1"  (1.854  m)   Wt 97.5 kg (215 lb)   SpO2 98%   BMI 28.37 kg/m   Physical Exam  Constitutional: He is oriented to person, place, and time. Vital signs are normal. He appears well-developed and well-nourished. He is cooperative.  Pt in pain when moving from laying down to sitting.  HENT:  Head: Normocephalic and atraumatic. Head is without contusion, without right periorbital erythema and without left periorbital erythema.  Cardiovascular: Normal rate, regular rhythm, normal heart sounds, intact distal pulses and normal pulses.  No murmur heard. Pulmonary/Chest: Effort normal and breath sounds normal. He exhibits tenderness. He exhibits no deformity.  Generalized tenderness to palpation on anterior chest wall.  Abdominal: Soft. Normal appearance and bowel sounds are normal. There is no tenderness.  Musculoskeletal:       Thoracic back: He exhibits bony tenderness.       Lumbar back: He exhibits bony tenderness.       Back:  Two horizontal ecchymoses visualized in thoracic and lumbar areas. Tenderness to palpation in paraspinal muscles of lumbar spine. Bony tenderness around T12-L1 spinous processes.  Neurological: He is alert and oriented to person, place, and time. He has normal strength and normal reflexes. No sensory deficit. He exhibits normal muscle tone. GCS eye subscore is 4. GCS verbal subscore is 5. GCS motor subscore is 6.  Reflex Scores:      Tricep reflexes are 2+ on the right side and 2+ on the left side.      Bicep reflexes are 2+ on the right side and 2+ on the left side.      Brachioradialis reflexes are 2+ on the right side and 2+ on the left side.      Patellar reflexes are 2+ on the right side and 2+ on the left side.      Achilles reflexes are 2+ on the right side and 2+ on the left side. Skin: Skin is warm and intact. Capillary refill takes less than 2 seconds. Ecchymosis noted.     ED Treatments / Results  Labs (all labs ordered are listed, but only abnormal results are  displayed) Labs Reviewed - No data to display  EKG None  Radiology No results found.  Procedures Procedures (including critical care time)  Medications Ordered in ED Medications  acetaminophen (TYLENOL) tablet 1,000 mg (1,000 mg Oral Given 04/18/18 1937)    Initial Impression / Assessment and Plan / ED Course  Triage vital signs and the nursing notes have been reviewed.  Pertinent labs & imaging results that were available during care of the patient were reviewed and considered in medical decision making (see chart for details).  Clinical Course as of Apr 19 1999  Wed Apr 18, 2018  1940 Pt took 2 Tramadol prior to arrival. Tylenol 650mg  x1 given for pain as he had severe pain especially when moving from lying down to sitting upright.   [GM]  1955 Imaging pending at shift change. Case discussed with PA Petrucelli who will assume care.    [GM]  1958 CXR resulted. No rib fractures or pneumothorax visualized.   [GM]    Clinical Course User Index [GM] Link Burgeson, Jonelle Sports, [place]   Patient presents in no acute distress, but is in pain following mechanical fall on his deck this afternoon. He has visible ecchymoses on his thoracic and lumbar spine with tenderness over some spinous processes in ~T12-L2 region. Equal thoracic expansion bilaterally and normal breath sounds throughout, but exhibits chest wall tenderness. CXR confirms that there is no pneumothorax or rib fractures. CT of lumbar and thoracic spine ordered and pending. Denies paresthesias, weaknesses, bowel and bladder dysfunction or issues with coordination or balance that would suggest spinal cord injury. Expected to go home with education on supportive and OTC treatment if imaging is unremarkable.   Final Clinical Impressions(s) / ED Diagnoses  1. Fall. Imaging is reassuring that there is no acute fractures, dislocations or other pathology that needs to be evaluated at this time. Advised to continue Tramadol and may add Tylenol  for pain relief. Education provided on OTC and supportive measures for pain relief. Referral to ortho if pain continues > 4 weeks.  Dispo: Imaging pending at shift change. Case discussed with who will take over care [facility], PA-C. Discharge home if imaging is negative.   Final diagnoses:  Fall, initial encounter    ED Discharge Orders    None       Junita Push 04/18/18 2013    Blanchie Dessert, MD 04/18/18 [phone]

## 2018-04-18 NOTE — ED Triage Notes (Addendum)
Pt slipped down the steps about an hour ago and injured his back, has horizontal abrasion to lower back and states he broke the step with the impact, pt took two tramadol prior to arrival

## 2018-04-19 ENCOUNTER — Telehealth: Payer: Self-pay | Admitting: Family Medicine

## 2018-04-19 NOTE — Telephone Encounter (Signed)
Called daughter back let her know that insurance will not cover for back pain. She was given the information for otc patches. They will give them a try and let us know if any problems.

## 2018-04-19 NOTE — Telephone Encounter (Signed)
Copied from [place] [id number]. Topic: Quick Communication - See Telephone Encounter >> Apr 19, 2018  8:01 AM Robina Ade, Helene Kelp D wrote: CRM for notification. See Telephone encounter for: 04/19/18. Patient wife Freda Munro called and would like lidocaine patches (lidocaine (LIDODERM) 5 %) for pain. Patient had to go to the ED for a fall and they said that his PCP would be the one that could prescribe this for him. Please call patient wife back.

## 2018-04-19 NOTE — Telephone Encounter (Signed)
See note

## 2018-04-23 ENCOUNTER — Telehealth: Payer: Self-pay | Admitting: Family Medicine

## 2018-04-23 NOTE — Telephone Encounter (Signed)
Request for refill for norco Last prescribed by Dr. Maryan Rued  (ED MD) at [facility]  For 15 tabs on 04/18/18 Dr. Juleen China [facility] [place]

## 2018-04-23 NOTE — Telephone Encounter (Signed)
I have scheduled the patient to come in Wednesday. You have not seen patient since 12/2016.

## 2018-04-23 NOTE — Telephone Encounter (Signed)
See note

## 2018-04-23 NOTE — Telephone Encounter (Signed)
Copied from [place] [phone] [phone]. Topic: Quick Communication - Rx Refill/Question >> Apr 23, 2018 12:22 PM Robina Ade, Helene Kelp D wrote: Medication:HYDROcodone-acetaminophen (NORCO/VICODIN) 5-325 MG tablet  Has the patient contacted their pharmacy? No, but patient cant see specialist until 06/26/18 and would like to know if Dr. Juleen China can give him refill until his appt with them. (Agent: If no, request that the patient contact the pharmacy for the refill.) (Agent: If yes, when and what did the pharmacy advise?)  Preferred Pharmacy (with phone number or street name):[id number] - [place], [place]  Agent: Please be advised that RX refills may take up to 3 business days. We ask that you follow-up with your pharmacy.

## 2018-04-25 ENCOUNTER — Ambulatory Visit: Payer: Medicare HMO | Admitting: Family Medicine

## 2018-05-04 ENCOUNTER — Ambulatory Visit: Payer: Medicare HMO | Admitting: *Deleted

## 2018-05-16 ENCOUNTER — Encounter: Payer: Self-pay | Admitting: *Deleted

## 2018-05-16 ENCOUNTER — Ambulatory Visit (INDEPENDENT_AMBULATORY_CARE_PROVIDER_SITE_OTHER): Payer: Medicare HMO | Admitting: *Deleted

## 2018-05-16 ENCOUNTER — Telehealth: Payer: Self-pay | Admitting: Family Medicine

## 2018-05-16 VITALS — BP 124/64 | HR 59 | Resp 16 | Ht 73.0 in | Wt 215.0 lb

## 2018-05-16 DIAGNOSIS — Z1159 Encounter for screening for other viral diseases: Secondary | ICD-10-CM | POA: Diagnosis not present

## 2018-05-16 DIAGNOSIS — Z1331 Encounter for screening for depression: Secondary | ICD-10-CM | POA: Diagnosis not present

## 2018-05-16 DIAGNOSIS — Z0000 Encounter for general adult medical examination without abnormal findings: Secondary | ICD-10-CM

## 2018-05-16 NOTE — Telephone Encounter (Signed)
See note

## 2018-05-16 NOTE — Progress Notes (Signed)
PCP notes:   Health maintenance: Hepatitis C screening: ordered today.  PCV13: Patient believes he had this through [place]. We were unsuccessful in getting records from [place] so [facility] with [facility].   Abnormal screenings: None.   Patient concerns: Just an update: Goes to [place] for migraines, receives Aimovig.  Nurse concerns: none.   Next PCP appt: None currently.

## 2018-05-16 NOTE — Progress Notes (Signed)
Subjective:   Robert [name] is a 72 y.o. male who presents for Medicare Annual/Subsequent preventive examination.  Lives in two story single family home with wife.   Review of Systems:  No ROS.  Medicare Wellness Visit. Additional risk factors are reflected in the social history.  Sleeps 7 hrs/night. Wears CPAP at night, wakes up feeling rested. Occasional naps after lunch.   Cardiac Risk Factors include: advanced age (>40men, >30 women);hypertension;male gender;obesity (BMI >30kg/m2)     Objective:    Vitals: BP 124/64   Pulse (!) 59   Resp 16   Ht 6\' 1"  (1.854 m)   Wt 215 lb (97.5 kg)   SpO2 95%   BMI 28.37 kg/m   Body mass index is 28.37 kg/m.  Advanced Directives 05/16/2018 04/18/2018 05/03/2017 01/14/2014 10/16/2012 10/13/2011 10/11/2011  Does Patient Have a Medical Advance Directive? Yes No Yes Patient has advance directive, copy not in chart Patient does not have advance directive;Patient would not like information Patient does not have advance directive Patient does not have advance directive  Type of Advance Directive [place];Living will - Living will;Healthcare Power of Attorney - - - -  Does patient want to make changes to medical advance directive? No - Patient declined - - No change requested - - -  Copy of [place] in Chart? Yes - No - copy requested Copy requested from other (Comment) - - -  Pre-existing out of facility DNR order (yellow form or pink MOST form) - - - - No No No    Tobacco Social History   Tobacco Use  Smoking Status Former Smoker  . Packs/day: 2.00  . Years: 20.00  . Pack years: 40.00  . Types: Cigarettes  . Last attempt to quit: 11/14/1982  . Years since quitting: 35.5  Smokeless Tobacco Never Used     Counseling given: Not Answered  Past Medical History:  Diagnosis Date  . CAD (coronary artery disease) 10/10/2011   NSTEMI with DES to the proximal RCA following flow wire evaluation. EF is normal.     . GERD (gastroesophageal reflux disease)   . H/O esophageal ulcer   . History of stomach ulcers   . Hyperlipidemia   . Hypertension   . Migraines    "quite often; maybe q 10d to 2 wk" (01/14/2014)  . Myocardial infarction ([place]) 10/2011  . OSA on CPAP    "wear it most of the time" (01/14/2014)  . 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 BACK SURGERY    . CATARACT EXTRACTION Bilateral 2019  . CERVICAL DISC SURGERY  1980's?  . CORONARY ANGIOPLASTY WITH STENT PLACEMENT  10/13/2011   DES to the proximal RCA. Dr Martinique  . FRACTIONAL FLOW RESERVE WIRE Right 10/13/2011   Procedure: FRACTIONAL FLOW RESERVE WIRE;  Surgeon: Peter M Martinique, MD;  Location: [facility] CATH LAB;  Service: Cardiovascular;  Laterality: Right;  . HERNIA REPAIR    . INGUINAL HERNIA REPAIR  10/23/2012   Procedure: HERNIA REPAIR INGUINAL ADULT;  Surgeon: Odis Hollingshead, MD;  Location: [facility];  Service: General;  Laterality: Left;  Left lower quadrant; left inguinal hernia repair with mesh  . INSERTION OF MESH  10/23/2012   Procedure: INSERTION OF MESH;  Surgeon: Odis Hollingshead, MD;  Location: [facility];  Service: General;  Laterality: Left;  Left lower quadrant  . LEFT HEART CATHETERIZATION WITH CORONARY  ANGIOGRAM N/A 10/13/2011   Procedure: LEFT HEART CATHETERIZATION WITH CORONARY ANGIOGRAM;  Surgeon: Peter M Martinique, MD;  Location[facility] CATH LAB;  Service: Cardiovascular;  Laterality: N/A;  . PERCUTANEOUS CORONARY STENT INTERVENTION (PCI-S) Right 10/13/2011   Procedure: PERCUTANEOUS CORONARY STENT INTERVENTION (PCI-S);  Surgeon: Peter M Martinique, MD;  Location: [facility] CATH LAB;  Service: Cardiovascular;  Laterality: Right;  . SHOULDER ARTHROSCOPY W/ ROTATOR CUFF REPAIR Left 1990's  . TRIGGER FINGER RELEASE Left    3rd and 4th digits   Family History  Problem Relation Age of Onset  . Cancer Mother        lung  . Heart disease Father        heart attack   . Cancer  Father        colon   Social History   Socioeconomic History  . Marital status: Married    Spouse name: Not on file  . Number of children: Not on file  . Years of education: Not on file  . Highest education level: 12th grade  Occupational History    Comment: Retired -[employer]  Social Needs  . Financial resource strain: Not on file  . Food insecurity:    Worry: Not on file    Inability: Not on file  . Transportation needs:    Medical: Not on file    Non-medical: Not on file  Tobacco Use  . Smoking status: Former Smoker    Packs/day: 2.00    Years: 20.00    Pack years: 40.00    Types: Cigarettes    Last attempt to quit: 11/14/1982    Years since quitting: 35.5  . Smokeless tobacco: Never Used  Substance and Sexual Activity  . Alcohol use: Yes    Alcohol/week: 3.6 oz    Types: 6 Cans of beer per week  . Drug use: No  . Sexual activity: Not Currently  Lifestyle  . Physical activity:    Days per week: Not on file    Minutes per session: Not on file  . Stress: Not on file  Relationships  . Social connections:    Talks on phone: Not on file    Gets together: Not on file    Attends religious service: Not on file    Active member of club or organization: Not on file    Attends meetings of clubs or organizations: Not on file    Relationship status: Not on file  Other Topics Concern  . Not on file  Social History Narrative   Pt lives with his wife. 1 and a half story home  No issues with stairs. 12th grade education    Outpatient Encounter Medications as of 05/16/2018  Medication Sig  . aspirin EC 81 MG tablet Take 81 mg by mouth daily.  . Cholecalciferol (VITAMIN D) 2000 UNITS CAPS Take 1 capsule by mouth daily.  . diclofenac sodium (VOLTAREN) 1 % GEL Apply 4 g topically 4 (four) times daily.  Marland Kitchen ezetimibe (ZETIA) 10 MG tablet Take 5 mg by mouth daily.  . fish oil-omega-3 fatty acids 1000 MG capsule Take 1 g by mouth daily.  Marland Kitchen levocetirizine (XYZAL) 5 MG tablet Take 5 mg by  mouth every evening.   . meclizine (ANTIVERT) 12.5 MG tablet Take 1 tablet (12.5 mg total) by mouth 3 (three) times daily as needed for dizziness.  . metoprolol tartrate (LOPRESSOR) 25 MG tablet Take 12.5 mg by mouth daily.  . mometasone (NASONEX) 50 MCG/ACT nasal spray Place 2 sprays into  the nose daily. Reported on 01/18/2016  . Multiple Vitamins-Minerals (MULTIVITAMINS THER. W/MINERALS) TABS Take 1 tablet by mouth daily.  . nitroGLYCERIN (NITROSTAT) 0.4 MG SL tablet Place 1 tablet (0.4 mg total) under the tongue every 5 (five) minutes as needed for chest pain. MAX 3 doses  . pantoprazole (PROTONIX) 40 MG tablet Take 1 tablet (40 mg total) by mouth daily.  . rosuvastatin (CRESTOR) 40 MG tablet Take 1 tablet (40 mg total) by mouth daily.  . traMADol (ULTRAM) 50 MG tablet Take 50 mg by mouth every 6 (six) hours as needed. For pain  . vitamin C (ASCORBIC ACID) 500 MG tablet Take 500 mg by mouth daily.  . verapamil (CALAN) 40 MG tablet Take 1 tablet (40 mg total) by mouth 3 (three) times daily. (Patient not taking: Reported on 05/16/2018)  . [DISCONTINUED] cyclobenzaprine (FLEXERIL) 10 MG tablet Take 1 tablet (10 mg total) by mouth 2 (two) times daily as needed for muscle spasms. (Patient not taking: Reported on 05/16/2018)  . [DISCONTINUED] HYDROcodone-acetaminophen (NORCO/VICODIN) 5-325 MG tablet Take 1 tablet by mouth every 6 (six) hours as needed for severe pain. (Patient not taking: Reported on 05/16/2018)  . [DISCONTINUED] lidocaine (LIDODERM) 5 % Place 1 patch onto the skin daily. Remove & Discard patch within 12 hours or as directed by MD (Patient not taking: Reported on 05/16/2018)   No facility-administered encounter medications on file as of 05/16/2018.     Activities of Daily Living In your present state of health, do you have any difficulty performing the following activities: 05/16/2018  Hearing? N  Vision? N  Difficulty concentrating or making decisions? N  Walking or climbing stairs? N    Dressing or bathing? N  Doing errands, shopping? N  Preparing Food and eating ? N  Using the Toilet? N  In the past six months, have you accidently leaked urine? N  Do you have problems with loss of bowel control? N  Managing your Medications? N  Managing your Finances? N  Housekeeping or managing your Housekeeping? N  Some recent data might be hidden    Patient Care Team: Briscoe Deutscher, DO as PCP - General (Family Medicine)   Assessment:   This is a routine wellness examination for [place].  Exercise Activities and Dietary recommendations Current Exercise Habits: The patient does not participate in regular exercise at present, Exercise limited by: None identified(Normally plays gold, but weather has not been permitting. )  2-3 12 oz bottles water/day. Discussed increasing water intake.  Breakfast: Couple cups of coffee and english muffin  Lunch: Usually at a restaurant Dinner: Salad  Snacks: very little  Goals    . Maintain current health       Fall Risk Fall Risk  05/16/2018 05/03/2017 11/28/2016 09/22/2015 01/15/2015  Falls in the past year? Yes No No Yes No  Number falls in past yr: 2 or more - - 1 -  Comment Fell going down stairs at home and misplaced hand outside and fell forward.  - - - -  Injury with Fall? Yes - - No -  Risk Factor Category  High Fall Risk - - - -  Risk for fall due to : History of fall(s) - - - -  Follow up Falls evaluation completed;Education provided;Falls prevention discussed - - Falls evaluation completed -   Depression Screen PHQ 2/9 Scores 05/16/2018 05/03/2017 11/28/2016 01/15/2015  PHQ - 2 Score 1 0 0 0  PHQ- 9 Score 1 - - -  PHQ9 score 1.  States he has been down since his fall, limited in movements. However this has been improving. He is following up with a back specialist and starting physical therapy through the [place]. No other signs or symptoms of depression. Total time spent on topic was 9 minutes.  Cognitive Function MMSE - Mini Mental State  Exam 05/03/2017  Orientation to time 5  Orientation to Place 5  Registration 3  Attention/ Calculation 5  Recall 3  Language- name 2 objects 2  Language- repeat 1  Language- follow 3 step command 3  Language- read & follow direction 1  Write a sentence 1  Copy design 1  Total score 30  Ad8 score reviewed for issues:  Issues making decisions:0  Less interest in hobbies / activities:0  Repeats questions, stories (family complaining):0  Trouble using ordinary gadgets (microwave, computer, phone):0  Forgets the month or year: 0  Mismanaging finances: 0  Remembering appts:0  Daily problems with thinking and/or memory:0 Ad8 score is=0 Reads paper and magazines.        Immunization History  Administered Date(s) Administered  . Influenza Whole 11/14/2005  . Influenza-Unspecified 07/15/2013, 10/14/2016  . Pneumococcal Polysaccharide-23 03/03/2009  . Td 10/14/2006  . Tdap 11/30/2017  . Zoster 10/28/2009   Screening Tests Health Maintenance  Topic Date Due  . Hepatitis C Screening  08/14/1946  . PNA vac Low Risk Adult (2 of 2 - PCV13) 03/03/2010  . COLONOSCOPY  05/15/2019  . TETANUS/TDAP  12/01/2027  . INFLUENZA VACCINE  Discontinued       Plan:   Follow up with PCP as directed.  I have personally reviewed and noted the following in the patient's chart:   . Medical and social history . Use of alcohol, tobacco or illicit drugs  . Current medications and supplements . Functional ability and status . Nutritional status . Physical activity . Advanced directives . List of other physicians . Vitals . Screenings to include cognitive, depression, and falls . Referrals and appointments  In addition, I have reviewed and discussed with patient certain preventive protocols, quality metrics, and best practice recommendations. A written personalized care plan for preventive services as well as general preventive health recommendations were provided to patient.      Williemae Area, RN  05/16/2018

## 2018-05-16 NOTE — Telephone Encounter (Signed)
Spoke with patient. He would like to know if Dr Juleen China would be willing to write a prescription for a lift chair. He bought one following his fall because of back pain. Patient seen in ED 04/18/18. Cancelled his 04/25/18 hospital follow up appt. I explained that he would need to schedule an appt with Dr Juleen China to discuss this. Last office visit 12/16/16. He states that he does not want to schedule an office visit until he knows if Dr Juleen China would be willing to write the prescription. Dr Juleen China, please advise.

## 2018-05-16 NOTE — Progress Notes (Signed)
I have personally reviewed the Medicare Annual Wellness questionnaire and have noted 1. The patient's medical and social history 2. Their use of alcohol, tobacco or illicit drugs 3. Their current medications and supplements 4. The patient's functional ability including ADL's, fall risks, home safety risks and hearing or visual impairment. 5. Diet and physical activities 6. Evidence for depression or mood disorders 7. Reviewed Updated provider list, see scanned forms and [facility] Snapshot.   The patients weight, height, BMI and visual acuity have been recorded in the chart I have made referrals, counseling and provided education to the patient based review of the above and I have provided the pt with a written personalized care plan for preventive services.  I have provided the patient with a copy of your personalized plan for preventive services. Instructed to take the time to review along with their updated medication list.  Robert [name]  

## 2018-05-16 NOTE — Telephone Encounter (Signed)
Copied from [place] [id number]. Topic: Quick Communication - See Telephone Encounter >> May 16, 2018  2:33 PM Percell Belt A wrote: CRM for notification. See Telephone encounter for: 05/16/18. Pt called in and and is requesting a call back from [place], [place]  He stated he was in today and would like to talk to her about the lift chair that he was discussing with her   Best number [phone]

## 2018-05-16 NOTE — Patient Instructions (Addendum)
Robert [name] ,  Please check with [facility] to find out when you got the PCV13 (Prevnar 13).   You were seen at [facility], Dr Maryan Rued [phone].   Thank you for taking time to come for your Medicare Wellness Visit. I appreciate your ongoing commitment to your health goals. Please review the following plan we discussed and let me know if I can assist you in the future.   These are the goals we discussed: Goals    . Maintain current health       This is a list of the screening recommended for you and due dates:  Health Maintenance  Topic Date Due  .  Hepatitis C: One time screening is recommended by Center for Disease Control  (CDC) for  adults born from [birth year] through 1967.   [birth year]  . Pneumonia vaccines (2 of 2 - PCV13) 03/03/2010  . Colon Cancer Screening  05/15/2019  . Tetanus Vaccine  12/01/2027  . Flu Shot  Discontinued   Preventive Care for Adults  A healthy lifestyle and preventive care can promote health and wellness. Preventive health guidelines for adults include the following key practices.  . A routine yearly physical is a good way to check with your health care provider about your health and preventive screening. It is a chance to share any concerns and updates on your health and to receive a thorough exam.  . Visit your dentist for a routine exam and preventive care every 6 months. Brush your teeth twice a day and floss once a day. Good oral hygiene prevents tooth decay and gum disease.  . The frequency of eye exams is based on your age, health, family medical history, use  of contact lenses, and other factors. Follow your health care provider's recommendations for frequency of eye exams.  . Eat a healthy diet. Foods like vegetables, fruits, whole grains, low-fat dairy products, and lean protein foods contain the nutrients you need without too many calories. Decrease your intake of foods high in solid fats, added sugars, and salt. Eat the right amount of  calories for you. Get information about a proper diet from your health care provider, if necessary.  . Regular physical exercise is one of the most important things you can do for your health. Most adults should get at least 150 minutes of moderate-intensity exercise (any activity that increases your heart rate and causes you to sweat) each week. In addition, most adults need muscle-strengthening exercises on 2 or more days a week.  Silver Sneakers may be a benefit available to you. To determine eligibility, you may visit the website: www.silversneakers.com or contact program at 3104914453 Mon-Fri between 8AM-8PM.   . Maintain a healthy weight. The body mass index (BMI) is a screening tool to identify possible weight problems. It provides an estimate of body fat based on height and weight. Your health care provider can find your BMI and can help you achieve or maintain a healthy weight.   For adults 20 years and older: ? A BMI below 18.5 is considered underweight. ? A BMI of 18.5 to 24.9 is normal. ? A BMI of 25 to 29.9 is considered overweight. ? A BMI of 30 and above is considered obese.   . Maintain normal blood lipids and cholesterol levels by exercising and minimizing your intake of saturated fat. Eat a balanced diet with plenty of fruit and vegetables. Blood tests for lipids and cholesterol should begin at age 60 and be repeated every  5 years. If your lipid or cholesterol levels are high, you are over 50, or you are at high risk for heart disease, you may need your cholesterol levels checked more frequently. Ongoing high lipid and cholesterol levels should be treated with medicines if diet and exercise are not working.  . If you smoke, find out from your health care provider how to quit. If you do not use tobacco, please do not start.  . If you choose to drink alcohol, please do not consume more than 2 drinks per day. One drink is considered to be 12 ounces (355 mL) of beer, 5 ounces  (148 mL) of wine, or 1.5 ounces (44 mL) of liquor.  . If you are 14-3 years old, ask your health care provider if you should take aspirin to prevent strokes.  . Use sunscreen. Apply sunscreen liberally and repeatedly throughout the day. You should seek shade when your shadow is shorter than you. Protect yourself by wearing long sleeves, pants, a wide-brimmed hat, and sunglasses year round, whenever you are outdoors.  . Once a month, do a whole body skin exam, using a mirror to look at the skin on your back. Tell your health care provider of new moles, moles that have irregular borders, moles that are larger than a pencil eraser, or moles that have changed in shape or color.

## 2018-05-17 LAB — HEPATITIS C ANTIBODY
Hepatitis C Ab: NONREACTIVE
SIGNAL TO CUT-OFF: 0.01 (ref <1.00)

## 2018-05-17 NOTE — Telephone Encounter (Signed)
I am okay with the lift prescription. However, I won't tolerate the attitude that the patient won't come to see me unless he gets what he wants. I haven't seen him in over one year. He has multiple medical issues and should be following up every three months. He should definitely follow up after a hospital visit. If he cannot accept this, I recommend that he transfers to another physician.

## 2018-05-19 NOTE — Telephone Encounter (Signed)
Can you contact patient about this?

## 2018-05-21 NOTE — Telephone Encounter (Signed)
Called and spoke with patient. He agreed to schedule an appointment for Wednesday. I also explained that Dr Juleen China will want to see him on a regular basis, every few months. He agreed to that plan as well.

## 2018-05-23 ENCOUNTER — Encounter: Payer: Self-pay | Admitting: Family Medicine

## 2018-05-23 ENCOUNTER — Ambulatory Visit (INDEPENDENT_AMBULATORY_CARE_PROVIDER_SITE_OTHER): Payer: Medicare HMO | Admitting: Family Medicine

## 2018-05-23 VITALS — BP 122/64 | HR 63 | Temp 98.4°F | Ht 73.0 in | Wt 214.2 lb

## 2018-05-23 DIAGNOSIS — M79604 Pain in right leg: Secondary | ICD-10-CM

## 2018-05-23 DIAGNOSIS — R2241 Localized swelling, mass and lump, right lower limb: Secondary | ICD-10-CM

## 2018-05-23 DIAGNOSIS — J301 Allergic rhinitis due to pollen: Secondary | ICD-10-CM

## 2018-05-23 DIAGNOSIS — Z86010 Personal history of colonic polyps: Secondary | ICD-10-CM | POA: Diagnosis not present

## 2018-05-23 DIAGNOSIS — S32009S Unspecified fracture of unspecified lumbar vertebra, sequela: Secondary | ICD-10-CM | POA: Diagnosis not present

## 2018-05-23 DIAGNOSIS — Z85828 Personal history of other malignant neoplasm of skin: Secondary | ICD-10-CM

## 2018-05-23 DIAGNOSIS — E785 Hyperlipidemia, unspecified: Secondary | ICD-10-CM | POA: Diagnosis not present

## 2018-05-23 DIAGNOSIS — N411 Chronic prostatitis: Secondary | ICD-10-CM

## 2018-05-23 DIAGNOSIS — E1165 Type 2 diabetes mellitus with hyperglycemia: Secondary | ICD-10-CM | POA: Diagnosis not present

## 2018-05-23 DIAGNOSIS — Z8719 Personal history of other diseases of the digestive system: Secondary | ICD-10-CM

## 2018-05-23 DIAGNOSIS — G4733 Obstructive sleep apnea (adult) (pediatric): Secondary | ICD-10-CM

## 2018-05-23 DIAGNOSIS — E1169 Type 2 diabetes mellitus with other specified complication: Secondary | ICD-10-CM | POA: Diagnosis not present

## 2018-05-23 DIAGNOSIS — Z8711 Personal history of peptic ulcer disease: Secondary | ICD-10-CM

## 2018-05-23 DIAGNOSIS — K219 Gastro-esophageal reflux disease without esophagitis: Secondary | ICD-10-CM | POA: Diagnosis not present

## 2018-05-23 DIAGNOSIS — S32038D Other fracture of third lumbar vertebra, subsequent encounter for fracture with routine healing: Secondary | ICD-10-CM

## 2018-05-23 DIAGNOSIS — H04123 Dry eye syndrome of bilateral lacrimal glands: Secondary | ICD-10-CM | POA: Insufficient documentation

## 2018-05-23 LAB — POCT GLYCOSYLATED HEMOGLOBIN (HGB A1C): Hemoglobin A1C: 6.2 % — AB (ref 4.0–5.6)

## 2018-05-23 LAB — MICROALBUMIN / CREATININE URINE RATIO
Creatinine,U: 35.8 mg/dL
Microalb Creat Ratio: 2 mg/g (ref 0.0–30.0)
Microalb, Ur: <0.7 mg/dL (ref 0.0–1.9)

## 2018-05-23 NOTE — Progress Notes (Signed)
Robert [name] is a 72 y.o. male is here for follow up.  History of Present Illness:   Robert [name], CMA acting as scribe for Dr. Briscoe Deutscher.   HPI:  ER VISIT REVIEWED. Robert [name] is a 72 y.o. male with a medical history of HLD, HTN, NSTEMI and DDD who presented to the ED following a fall. Patient slipped on his wet deck and landed directly on his back and one of the steps broke due to the impact. Patient currently endorses lower back and anterior chest wall pain. He is able to ambulate, but it is painful for him to turn when lying on his back. Denies head trauma or LOC during the fall. Denies paresthesias, weakness or coordination/balance issues. States it is painful to ambulate. Denies bowel or urinary dysfunction. Patient has taken 2 Tramadol prior to coming to the ED. Patient had past surgery on his neck for cervical spondylosis and endorses arthritis in his low back, but no lumbar spine surgeries. CT of the thoracic spine was negative however CT of the lumbar spine showed L1-3 transverse process fractures on the right which is localized where patient's pain is.  He is currently neurovascularly intact.  Patient given oral pain control as well as Lidoderm patches and Voltaren gel.  Spoke with Dr. Christella Noa who confirms that patient does not need bracing or any further intervention at this time but pain control and follow-up.  Patient and his family are comfortable with this plan and he was discharged home.  Patient states he had fall down stairs on deck. Fell on back and broke steps that he landed on. Patient states that the hardest part of recovery was getting from sitting to standing or laying down flat. He purchased a lift char that has been very beneficial. He would like letter of medical necessity to help get reimbursed. He is still using the chair for transition from setting to standing.   He has follow up with Dr. Durene Cal in a few weeks.   He had A1C and diabetic foot exam and  reviewed all diabetic information with him in office today. He questioned if he needed PSA. He is not having any symptoms or problems. All information regarding symptoms and reasons to check were reviewed and patient decided that he does not need at this time.   Health Maintenance Due  Topic Date Due  . FOOT EXAM  12/05/1955  . OPHTHALMOLOGY EXAM  12/05/1955  . PNA vac Low Risk Adult (2 of 2 - PCV13) 03/03/2010  . URINE MICROALBUMIN  10/20/2012   Depression screen [facility] 2/9 05/16/2018 05/03/2017 11/28/2016  Decreased Interest 0 0 0  Down, Depressed, Hopeless 1 0 0  PHQ - 2 Score 1 0 0  Altered sleeping 0 - -  Tired, decreased energy 0 - -  Change in appetite 0 - -  Feeling bad or failure about yourself  0 - -  Trouble concentrating 0 - -  Moving slowly or fidgety/restless 0 - -  Suicidal thoughts 0 - -  PHQ-9 Score 1 - -  Difficult doing work/chores Not difficult at all - -   PMHx, SurgHx, SocialHx, FamHx, Medications, and Allergies were reviewed in the Visit Navigator and updated as appropriate.   Patient Active Problem List   Diagnosis Date Noted  . Type 2 diabetes mellitus with hyperglycemia ([place]) 05/23/2018  . Chronically dry eyes, bilateral 05/23/2018  . DDD (degenerative disc disease), lumbar 07/13/2017  . Squamous cell carcinoma 12/17/2016  . Bursitis  11/28/2016  . Ischial bursitis 11/28/2016  . Migraines 11/28/2016  . Prediabetes 11/27/2016  . Cervicogenic headache 09/23/2016  . Cervical radiculopathy 09/23/2016  . Degenerative disc disease, cervical 09/23/2016  . Pain syndrome, chronic 09/23/2016  . Cervical spondylolysis 06/20/2016  . Primary osteoarthritis of right hand 03/21/2016  . 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 Acquired trigger finger 10/28/2014  . Dizziness and giddiness 07/05/2014  . Flexor tenosynovitis of finger 06/23/2014  . S/P drug eluting coronary stent placement 04/08/2014  . NSTEMI (non-ST elevated myocardial infarction) ([place])  10/13/2011  . Hypertension associated with diabetes ([place]) 10/11/2011  . CAD - RCA DES Nov 2012 10/10/2011  . Inguinal hernia without mention of obstruction or gangrene, unilateral or unspecified, (not specified as recurrent), left 07/27/2011  . Chronic headache disorder 01/29/2010  . History of basal cell carcinoma (BCC), right ear 03/03/2009  . Hyperlipidemia associated with type 2 diabetes mellitus ([place]) 01/11/2008  . Allergic rhinitis 01/07/2008  . Prostatitis, recurrent 01/07/2008  . History of gastric ulcer 01/07/2008  . History of colon polyps, followed by Dr. Earlean Shawl 01/07/2008  . GERD 04/02/2007  . Sleep apnea 04/02/2007   Social History   Tobacco Use  . Smoking status: Former Smoker    Packs/day: 2.00    Years: 20.00    Pack years: 40.00    Types: Cigarettes    Last attempt to quit: 11/14/1982    Years since quitting: 35.5  . Smokeless tobacco: Never Used  Substance Use Topics  . Alcohol use: Yes    Alcohol/week: 3.6 oz    Types: 6 Cans of beer per week  . Drug use: No   Current Medications and Allergies:   Current Outpatient Medications:  .  aspirin EC 81 MG tablet, Take 81 mg by mouth daily., Disp: , Rfl:  .  Cholecalciferol (VITAMIN D) 2000 UNITS CAPS, Take 1 capsule by mouth daily., Disp: , Rfl:  .  diclofenac sodium (VOLTAREN) 1 % GEL, Apply 4 g topically 4 (four) times daily., Disp: 100 g, Rfl: 1 .  Erenumab-aooe (AIMOVIG) 70 MG/ML SOAJ, Inject into the skin., Disp: , Rfl:  .  ezetimibe (ZETIA) 10 MG tablet, Take 5 mg by mouth daily., Disp: , Rfl:  .  fish oil-omega-3 fatty acids 1000 MG capsule, Take 1 g by mouth daily., Disp: , Rfl:  .  levocetirizine (XYZAL) 5 MG tablet, Take 5 mg by mouth every evening. , Disp: , Rfl:  .  metoprolol tartrate (LOPRESSOR) 25 MG tablet, Take 12.5 mg by mouth daily., Disp: , Rfl:  .  Multiple Vitamins-Minerals (MULTIVITAMINS THER. W/MINERALS) TABS, Take 1 tablet by mouth daily., Disp: , Rfl:  .  nitroGLYCERIN (NITROSTAT) 0.4 MG  SL tablet, Place 1 tablet (0.4 mg total) under the tongue every 5 (five) minutes as needed for chest pain. MAX 3 doses, Disp: 25 tablet, Rfl: 3 .  pantoprazole (PROTONIX) 40 MG tablet, Take 1 tablet (40 mg total) by mouth daily., Disp: 90 tablet, Rfl: 3 .  rosuvastatin (CRESTOR) 40 MG tablet, Take 1 tablet (40 mg total) by mouth daily., Disp: 90 tablet, Rfl: 3 .  traMADol (ULTRAM) 50 MG tablet, Take 50 mg by mouth every 6 (six) hours as needed. For pain, Disp: , Rfl:  .  vitamin C (ASCORBIC ACID) 500 MG tablet, Take 500 mg by mouth daily., Disp: , Rfl:    Allergies  Allergen Reactions  . Penicillins     itching  . Sumatriptan     REACTION: sob, headache  scalp sensitivity  . Oxycodone Itching   Review of Systems   Pertinent items are noted in the HPI. Otherwise, ROS is negative.  Vitals:   Vitals:   05/23/18 0848  BP: 122/64  Pulse: 63  Temp: 98.4 F (36.9 C)  TempSrc: Oral  SpO2: 94%  Weight: 214 lb 3.2 oz (97.2 kg)  Height: 6\' 1"  (1.854 m)     Body mass index is 28.26 kg/m.  Physical Exam:   Physical Exam  Constitutional: He is oriented to person, place, and time. He appears well-developed and well-nourished. No distress.  HENT:  Head: Normocephalic and atraumatic.  Right Ear: External ear normal.  Left Ear: External ear normal.  Nose: Nose normal.  Mouth/Throat: Oropharynx is clear and moist.  Eyes: Pupils are equal, round, and reactive to light. Conjunctivae and EOM are normal.  Neck: Normal range of motion. Neck supple.  Cardiovascular: Normal rate, regular rhythm, normal heart sounds and intact distal pulses.  Pulmonary/Chest: Effort normal and breath sounds normal.  Abdominal: Soft. Bowel sounds are normal.  Musculoskeletal: Normal range of motion.  Neurological: He is alert and oriented to person, place, and time.  Skin: Skin is warm and dry.  Psychiatric: He has a normal mood and affect. His behavior is normal. Judgment and thought content normal.    Nursing note and vitals reviewed.    Results for orders placed or performed in visit on 05/23/18  POCT glycosylated hemoglobin (Hb A1C)  Result Value Ref Range   Hemoglobin A1C 6.2 (A) 4.0 - 5.6 %   HbA1c POC (<> result, manual entry)  4.0 - 5.6 %   HbA1c, POC (prediabetic range)  5.7 - 6.4 %   HbA1c, POC (controlled diabetic range)  0.0 - 7.0 %    Assessment and Plan:   Robert [name] was seen today for follow-up.  Diagnoses and all orders for this visit:  Closed fracture of transverse process of lumbar vertebra, sequela  Type 2 diabetes mellitus with hyperglycemia, without long-term current use of insulin ([name]) -     Microalbumin / creatinine urine ratio -     POCT glycosylated hemoglobin (Hb A1C)  Seasonal allergic rhinitis due to pollen  Obstructive sleep apnea syndrome  Gastroesophageal reflux disease, esophagitis presence not specified  Chronic prostatitis  History of colon polyps, followed by Dr. Earlean Shawl  Hyperlipidemia associated with type 2 diabetes mellitus ([place])  History of basal cell carcinoma (BCC)  History of gastric ulcer  Chronically dry eyes, bilateral    . Reviewed expectations re: course of current medical issues. . Discussed self-management of symptoms. . Outlined signs and symptoms indicating need for more acute intervention. . Patient verbalized understanding and all questions were answered. Marland Kitchen Health Maintenance issues including appropriate healthy diet, exercise, and smoking avoidance were discussed with patient. . See orders for this visit as documented in the electronic medical record. . Patient received an After Visit Summary.  Briscoe Deutscher, DO [place], [facility] 05/23/2018  Future Appointments  Date Time Provider [place]  08/29/2018  8:20 AM Briscoe Deutscher, DO [facility]  05/22/2019 10:00 AM [facility] HEALTH COACH [facility]   CMA served as Education administrator during this visit. History, Physical, and Plan performed by medical  provider. The above documentation has been reviewed and is accurate and complete. Briscoe Deutscher, D.O.  Records requested if needed. Time spent with the patient: 45 minutes, of which >50% was spent in obtaining information about his symptoms, reviewing his previous labs, evaluations, and treatments, counseling him about  his condition (please see the discussed topics above), and developing a plan to further investigate it; he had a number of questions which I addressed.

## 2018-06-04 DIAGNOSIS — M549 Dorsalgia, unspecified: Secondary | ICD-10-CM | POA: Diagnosis not present

## 2018-06-04 DIAGNOSIS — S32009A Unspecified fracture of unspecified lumbar vertebra, initial encounter for closed fracture: Secondary | ICD-10-CM | POA: Diagnosis not present

## 2018-07-28 DIAGNOSIS — J0100 Acute maxillary sinusitis, unspecified: Secondary | ICD-10-CM | POA: Diagnosis not present

## 2018-08-07 DIAGNOSIS — L570 Actinic keratosis: Secondary | ICD-10-CM | POA: Diagnosis not present

## 2018-08-07 DIAGNOSIS — L853 Xerosis cutis: Secondary | ICD-10-CM | POA: Diagnosis not present

## 2018-08-07 DIAGNOSIS — D225 Melanocytic nevi of trunk: Secondary | ICD-10-CM | POA: Diagnosis not present

## 2018-08-07 DIAGNOSIS — L720 Epidermal cyst: Secondary | ICD-10-CM | POA: Diagnosis not present

## 2018-08-07 DIAGNOSIS — D1801 Hemangioma of skin and subcutaneous tissue: Secondary | ICD-10-CM | POA: Diagnosis not present

## 2018-08-07 DIAGNOSIS — L821 Other seborrheic keratosis: Secondary | ICD-10-CM | POA: Diagnosis not present

## 2018-08-07 DIAGNOSIS — Z85828 Personal history of other malignant neoplasm of skin: Secondary | ICD-10-CM | POA: Diagnosis not present

## 2018-08-07 DIAGNOSIS — D2239 Melanocytic nevi of other parts of face: Secondary | ICD-10-CM | POA: Diagnosis not present

## 2018-08-28 NOTE — Progress Notes (Signed)
Robert [name] is a 72 y.o. male is here for follow up.  History of Present Illness:   HPI:   Current symptoms: no polyuria or polydipsia, no chest pain, dyspnea or TIA's, no numbness, tingling or pain in extremities.   Lab Results  Component Value Date   HGBA1C 6.2 (A) 05/23/2018    Lab Results  Component Value Date   MICROALBUR <0.7 05/23/2018    Lab Results  Component Value Date   CHOL 156 03/14/2018   HDL 61 03/14/2018   LDLCALC 74 03/14/2018   LDLDIRECT 137.0 03/31/2010   TRIG 103 03/14/2018   CHOLHDL 2.2 04/28/2017     Wt Readings from Last 3 Encounters:  08/29/18 215 lb 3.2 oz (97.6 kg)  05/23/18 214 lb 3.2 oz (97.2 kg)  05/16/18 215 lb (97.5 kg)   BP Readings from Last 3 Encounters:  08/29/18 124/66  05/23/18 122/64  05/16/18 124/64   Lab Results  Component Value Date   CREATININE 1.02 01/26/2018   Health Maintenance Due  Topic Date Due  . OPHTHALMOLOGY EXAM  12/05/1955  . PNA vac Low Risk Adult (2 of 2 - PCV13) 03/03/2010   Depression screen [facility] 2/9 05/16/2018 05/03/2017 11/28/2016  Decreased Interest 0 0 0  Down, Depressed, Hopeless 1 0 0  PHQ - 2 Score 1 0 0  Altered sleeping 0 - -  Tired, decreased energy 0 - -  Change in appetite 0 - -  Feeling bad or failure about yourself  0 - -  Trouble concentrating 0 - -  Moving slowly or fidgety/restless 0 - -  Suicidal thoughts 0 - -  PHQ-9 Score 1 - -  Difficult doing work/chores Not difficult at all - -   PMHx, SurgHx, SocialHx, FamHx, Medications, and Allergies were reviewed in the Visit Navigator and updated as appropriate.   Patient Active Problem List   Diagnosis Date Noted  . Type 2 diabetes mellitus with hyperglycemia ([place]) 05/23/2018  . Chronically dry eyes, bilateral 05/23/2018  . DDD (degenerative disc disease), lumbar 07/13/2017  . Squamous cell carcinoma 12/17/2016  . Bursitis 11/28/2016  . Ischial bursitis 11/28/2016  . Migraines 11/28/2016  . Prediabetes 11/27/2016  . Cervicogenic  headache 09/23/2016  . Cervical radiculopathy 09/23/2016  . Degenerative disc disease, cervical 09/23/2016  . Pain syndrome, chronic 09/23/2016  . Cervical spondylolysis 06/20/2016  . Primary osteoarthritis of right hand 03/21/2016  . 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 Acquired trigger finger 10/28/2014  . Dizziness and giddiness 07/05/2014  . Flexor tenosynovitis of finger 06/23/2014  . S/P drug eluting coronary stent placement 04/08/2014  . NSTEMI (non-ST elevated myocardial infarction) ([place]) 10/13/2011  . Hypertension associated with diabetes ([place]) 10/11/2011  . CAD - RCA DES Nov 2012 10/10/2011  . Inguinal hernia without mention of obstruction or gangrene, unilateral or unspecified, (not specified as recurrent), left 07/27/2011  . Chronic headache disorder 01/29/2010  . History of basal cell carcinoma (BCC), right ear 03/03/2009  . Hyperlipidemia associated with type 2 diabetes mellitus ([place]) 01/11/2008  . Allergic rhinitis 01/07/2008  . Prostatitis, recurrent 01/07/2008  . History of gastric ulcer 01/07/2008  . History of colon polyps, followed by Dr. Earlean Shawl 01/07/2008  . GERD 04/02/2007  . Sleep apnea 04/02/2007   Social History   Tobacco Use  . Smoking status: Former Smoker    Packs/day: 2.00    Years: 20.00    Pack years: 40.00    Types: Cigarettes    Last attempt to quit: 11/14/1982  Years since quitting: 35.8  . Smokeless tobacco: Never Used  Substance Use Topics  . Alcohol use: Yes    Alcohol/week: 6.0 standard drinks    Types: 6 Cans of beer per week  . Drug use: No   Current Medications and Allergies:   .  aspirin EC 81 MG tablet, Take 81 mg by mouth daily., Disp: , Rfl:  .  Cholecalciferol (VITAMIN D) 2000 UNITS CAPS, Take 1 capsule by mouth daily., Disp: , Rfl:  .  diclofenac sodium (VOLTAREN) 1 % GEL, Apply 4 g topically 4 (four) times daily., Disp: 100 g, Rfl: 1 .  ezetimibe (ZETIA) 10 MG tablet, Take 5 mg by mouth daily., Disp: , Rfl:  .   fish oil-omega-3 fatty acids 1000 MG capsule, Take 1 g by mouth daily., Disp: , Rfl:  .  levocetirizine (XYZAL) 5 MG tablet, Take 5 mg by mouth every evening. , Disp: , Rfl:  .  metoprolol tartrate (LOPRESSOR) 25 MG tablet, Take 12.5 mg by mouth daily., Disp: , Rfl:  .  Multiple Vitamins-Minerals (MULTIVITAMINS THER. W/MINERALS) TABS, Take 1 tablet by mouth daily., Disp: , Rfl:  .  nitroGLYCERIN (NITROSTAT) 0.4 MG SL tablet, Place 1 tablet (0.4 mg total) under the tongue every 5 (five) minutes as needed for chest pain. MAX 3 doses, Disp: 25 tablet, Rfl: 3 .  pantoprazole (PROTONIX) 40 MG tablet, Take 1 tablet (40 mg total) by mouth daily., Disp: 90 tablet, Rfl: 3 .  rosuvastatin (CRESTOR) 40 MG tablet, Take 1 tablet (40 mg total) by mouth daily., Disp: 90 tablet, Rfl: 3 .  traMADol (ULTRAM) 50 MG tablet, Take 50 mg by mouth every 6 (six) hours as needed. For pain, Disp: , Rfl:  .  vitamin C (ASCORBIC ACID) 500 MG tablet, Take 500 mg by mouth daily., Disp: , Rfl:    Allergies  Allergen Reactions  . Penicillins     itching  . Sumatriptan     REACTION: sob, headache scalp sensitivity  . Oxycodone Itching   Review of Systems   Pertinent items are noted in the HPI. Otherwise, ROS is negative.  Vitals:   Vitals:   08/29/18 0815  BP: 124/66  Pulse: 61  Temp: 97.8 F (36.6 C)  TempSrc: Oral  SpO2: 97%  Weight: 215 lb 3.2 oz (97.6 kg)  Height: 6\' 1"  (1.854 m)     Body mass index is 28.39 kg/m.  Physical Exam:   Physical Exam  Constitutional: He is oriented to person, place, and time. He appears well-developed and well-nourished. No distress.  HENT:  Head: Normocephalic and atraumatic.  Right Ear: External ear normal.  Left Ear: External ear normal.  Nose: Nose normal.  Mouth/Throat: Oropharynx is clear and moist.  Eyes: Pupils are equal, round, and reactive to light. Conjunctivae and EOM are normal.  Neck: Normal range of motion. Neck supple.  Cardiovascular: Normal rate,  regular rhythm, normal heart sounds and intact distal pulses.  Pulmonary/Chest: Effort normal and breath sounds normal.  Abdominal: Soft. Bowel sounds are normal.  Musculoskeletal: Normal range of motion.  Neurological: He is alert and oriented to person, place, and time.  Skin: Skin is warm and dry.  Psychiatric: He has a normal mood and affect. His behavior is normal. Judgment and thought content normal.  Nursing note and vitals reviewed.   Assessment and Plan:   [place] was seen today for diabetes.  Diagnoses and all orders for this visit:  Type 2 diabetes mellitus with hyperglycemia, without long-term  current use of insulin ([name]) -     POCT glycosylated hemoglobin (Hb A1C)  Coronary artery disease involving native coronary artery of native heart without angina pectoris Comments: No CP, SOB, HA, dizziness, N/V/D/C, edema. Golfs twice a week. Still going to [place] as well.  Hyperlipidemia associated with type 2 diabetes mellitus ([place]) Comments: Will get CMP, FLP at next visit.    . Reviewed expectations re: course of current medical issues. . Discussed self-management of symptoms. . Outlined signs and symptoms indicating need for more acute intervention. . Patient verbalized understanding and all questions were answered. Marland Kitchen Health Maintenance issues including appropriate healthy diet, exercise, and smoking avoidance were discussed with patient. . See orders for this visit as documented in the electronic medical record. . Patient received an After Visit Summary.  Briscoe Deutscher, DO [place], [facility] 08/29/2018

## 2018-08-29 ENCOUNTER — Encounter: Payer: Self-pay | Admitting: Family Medicine

## 2018-08-29 ENCOUNTER — Ambulatory Visit (INDEPENDENT_AMBULATORY_CARE_PROVIDER_SITE_OTHER): Payer: Medicare HMO | Admitting: Family Medicine

## 2018-08-29 VITALS — BP 124/66 | HR 61 | Temp 97.8°F | Ht 73.0 in | Wt 215.2 lb

## 2018-08-29 DIAGNOSIS — E785 Hyperlipidemia, unspecified: Secondary | ICD-10-CM | POA: Diagnosis not present

## 2018-08-29 DIAGNOSIS — E1169 Type 2 diabetes mellitus with other specified complication: Secondary | ICD-10-CM

## 2018-08-29 DIAGNOSIS — E1165 Type 2 diabetes mellitus with hyperglycemia: Secondary | ICD-10-CM | POA: Diagnosis not present

## 2018-08-29 DIAGNOSIS — I2510 Atherosclerotic heart disease of native coronary artery without angina pectoris: Secondary | ICD-10-CM

## 2018-08-29 LAB — POCT GLYCOSYLATED HEMOGLOBIN (HGB A1C): Hemoglobin A1C: 6.1 % — AB (ref 4.0–5.6)

## 2018-09-25 DIAGNOSIS — H52223 Regular astigmatism, bilateral: Secondary | ICD-10-CM | POA: Diagnosis not present

## 2018-11-13 DIAGNOSIS — H3589 Other specified retinal disorders: Secondary | ICD-10-CM | POA: Insufficient documentation

## 2018-11-13 DIAGNOSIS — H35362 Drusen (degenerative) of macula, left eye: Secondary | ICD-10-CM | POA: Diagnosis not present

## 2018-11-13 DIAGNOSIS — H4720 Unspecified optic atrophy: Secondary | ICD-10-CM | POA: Diagnosis not present

## 2018-11-13 DIAGNOSIS — H35369 Drusen (degenerative) of macula, unspecified eye: Secondary | ICD-10-CM | POA: Insufficient documentation

## 2018-11-13 DIAGNOSIS — H26491 Other secondary cataract, right eye: Secondary | ICD-10-CM | POA: Diagnosis not present

## 2018-11-13 DIAGNOSIS — H26493 Other secondary cataract, bilateral: Secondary | ICD-10-CM | POA: Insufficient documentation

## 2018-11-13 LAB — HM DIABETES EYE EXAM

## 2018-11-27 DIAGNOSIS — G44229 Chronic tension-type headache, not intractable: Secondary | ICD-10-CM | POA: Diagnosis not present

## 2018-11-27 DIAGNOSIS — H4720 Unspecified optic atrophy: Secondary | ICD-10-CM | POA: Diagnosis not present

## 2018-11-27 DIAGNOSIS — Z961 Presence of intraocular lens: Secondary | ICD-10-CM | POA: Diagnosis not present

## 2018-11-28 ENCOUNTER — Encounter: Payer: Self-pay | Admitting: Family Medicine

## 2018-11-28 ENCOUNTER — Ambulatory Visit (INDEPENDENT_AMBULATORY_CARE_PROVIDER_SITE_OTHER): Payer: Medicare HMO | Admitting: Family Medicine

## 2018-11-28 VITALS — BP 122/64 | HR 55 | Temp 98.2°F | Ht 73.0 in | Wt 215.6 lb

## 2018-11-28 DIAGNOSIS — E1165 Type 2 diabetes mellitus with hyperglycemia: Secondary | ICD-10-CM | POA: Diagnosis not present

## 2018-11-28 DIAGNOSIS — I152 Hypertension secondary to endocrine disorders: Secondary | ICD-10-CM

## 2018-11-28 DIAGNOSIS — E1169 Type 2 diabetes mellitus with other specified complication: Secondary | ICD-10-CM

## 2018-11-28 DIAGNOSIS — H539 Unspecified visual disturbance: Secondary | ICD-10-CM

## 2018-11-28 DIAGNOSIS — R51 Headache: Secondary | ICD-10-CM | POA: Diagnosis not present

## 2018-11-28 DIAGNOSIS — E1159 Type 2 diabetes mellitus with other circulatory complications: Secondary | ICD-10-CM

## 2018-11-28 DIAGNOSIS — E785 Hyperlipidemia, unspecified: Secondary | ICD-10-CM | POA: Diagnosis not present

## 2018-11-28 DIAGNOSIS — I10 Essential (primary) hypertension: Secondary | ICD-10-CM

## 2018-11-28 DIAGNOSIS — G8929 Other chronic pain: Secondary | ICD-10-CM

## 2018-11-28 DIAGNOSIS — R519 Headache, unspecified: Secondary | ICD-10-CM

## 2018-11-28 LAB — POCT GLYCOSYLATED HEMOGLOBIN (HGB A1C): Hemoglobin A1C: 6 % — AB (ref 4.0–5.6)

## 2018-11-28 NOTE — Progress Notes (Signed)
Robert [name] is a 73 y.o. male is here for follow up.  History of Present Illness:   Lonell Grandchild, CMA acting as scribe for Dr. Briscoe Deutscher.   HPI: Patient in office for follow up no new problems at this time.    Type 2 diabetes mellitus with hyperglycemia, without long-term current use of insulin ([name]) Medication compliance: compliant all of the time, diabetic diet compliance: compliant all of the time, home glucose monitoring:is checking at home weekly,  further diabetic ROS: no polyuria or polydipsia, no chest pain, dyspnea or TIA's, no numbness, tingling or pain in extremities.  Coronary artery disease involving native coronary artery of native heart without angina pectoris No CP, SOB, HA, dizziness, N/V/D/C, edema. Golfs twice a week during warmer weather but having trouble since vision changes from multifocal lenses.  Still going to [place] as well.  Hyperlipidemia associated with type 2 diabetes mellitus ([place]) Is the patient taking medications without problems? Yes. Does the patient complain of muscle aches?  No. Trying to exercise on a regular basis? Yes. Compliant with diet? Yes.  Lab Results  Component Value Date   CHOL 156 03/14/2018   HDL 61 03/14/2018   LDLCALC 74 03/14/2018   LDLDIRECT 137.0 03/31/2010   TRIG 103 03/14/2018   CHOLHDL 2.2 04/28/2017   Lab Results  Component Value Date   ALT 27 03/14/2018   AST 26 03/14/2018   ALKPHOS 65 03/14/2018   BILITOT 0.5 03/14/2018      Health Maintenance Due  Topic Date Due  . OPHTHALMOLOGY EXAM  12/05/1955  . PNA vac Low Risk Adult (2 of 2 - PCV13) 03/03/2010   Depression screen [facility] 2/9 11/28/2018 05/16/2018 05/03/2017  Decreased Interest 1 0 0  Down, Depressed, Hopeless 0 1 0  PHQ - 2 Score 1 1 0  Altered sleeping 0 0 -  Tired, decreased energy 1 0 -  Change in appetite 0 0 -  Feeling bad or failure about yourself  0 0 -  Trouble concentrating 0 0 -  Moving slowly or fidgety/restless 0 0 -  Suicidal thoughts 0  0 -  PHQ-9 Score 2 1 -  Difficult doing work/chores Not difficult at all Not difficult at all -   PMHx, SurgHx, SocialHx, FamHx, Medications, and Allergies were reviewed in the Visit Navigator and updated as appropriate.   Patient Active Problem List   Diagnosis Date Noted  . Vision changes 11/28/2018  . Type 2 diabetes mellitus with hyperglycemia ([place]) 05/23/2018  . Chronically dry eyes, bilateral 05/23/2018  . DDD (degenerative disc disease), lumbar 07/13/2017  . Ischial bursitis 11/28/2016  . Migraines 11/28/2016  . Cervicogenic headache 09/23/2016  . Cervical radiculopathy 09/23/2016  . Degenerative disc disease, cervical 09/23/2016  . Pain syndrome, chronic 09/23/2016  . Cervical spondylolysis 06/20/2016  . Primary osteoarthritis of right hand 03/21/2016  . 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 Acquired trigger finger 10/28/2014  . Dizziness and giddiness 07/05/2014  . Flexor tenosynovitis of finger 06/23/2014  . S/P drug eluting coronary stent placement 04/08/2014  . History of non-ST elevation myocardial infarction (NSTEMI) 10/13/2011  . Hypertension associated with diabetes ([place]) 10/11/2011  . CAD - RCA DES Nov 2012 10/10/2011  . Inguinal hernia without mention of obstruction or gangrene, unilateral or unspecified, (not specified as recurrent), left 07/27/2011  . Chronic headache disorder 01/29/2010  . History of basal cell carcinoma (BCC), right ear 03/03/2009  . Hyperlipidemia associated with type 2 diabetes mellitus ([place]) 01/11/2008  .  Allergic rhinitis 01/07/2008  . Prostatitis, recurrent 01/07/2008  . History of gastric ulcer 01/07/2008  . History of colon polyps, followed by Dr. Earlean Shawl 01/07/2008  . GERD 04/02/2007  . Sleep apnea 04/02/2007   Social History   Tobacco Use  . Smoking status: Former Smoker    Packs/day: 2.00    Years: 20.00    Pack years: 40.00    Types: Cigarettes    Last attempt to quit: 11/14/1982    Years since quitting: 36.0  .  Smokeless tobacco: Never Used  Substance Use Topics  . Alcohol use: Yes    Alcohol/week: 6.0 standard drinks    Types: 6 Cans of beer per week  . Drug use: No   Current Medications and Allergies:   .  aspirin EC 81 MG tablet, Take 81 mg by mouth daily., Disp: , Rfl:  .  Cholecalciferol (VITAMIN D) 2000 UNITS CAPS, Take 1 capsule by mouth daily., Disp: , Rfl:  .  diclofenac sodium (VOLTAREN) 1 % GEL, Apply 4 g topically 4 (four) times daily., Disp: 100 g, Rfl: 1 .  ezetimibe (ZETIA) 10 MG tablet, Take 5 mg by mouth daily., Disp: , Rfl:  .  fish oil-omega-3 fatty acids 1000 MG capsule, Take 1 g by mouth daily., Disp: , Rfl:  .  levocetirizine (XYZAL) 5 MG tablet, Take 5 mg by mouth every evening. , Disp: , Rfl:  .  metoprolol tartrate (LOPRESSOR) 25 MG tablet, Take 12.5 mg by mouth daily., Disp: , Rfl:  .  Multiple Vitamins-Minerals (MULTIVITAMINS THER. W/MINERALS) TABS, Take 1 tablet by mouth daily., Disp: , Rfl:  .  nitroGLYCERIN (NITROSTAT) 0.4 MG SL tablet, Place 1 tablet (0.4 mg total) under the tongue every 5 (five) minutes as needed for chest pain. MAX 3 doses, Disp: 25 tablet, Rfl: 3 .  pantoprazole (PROTONIX) 40 MG tablet, Take 1 tablet (40 mg total) by mouth daily., Disp: 90 tablet, Rfl: 3 .  rosuvastatin (CRESTOR) 40 MG tablet, Take 1 tablet (40 mg total) by mouth daily., Disp: 90 tablet, Rfl: 3 .  traMADol (ULTRAM) 50 MG tablet, Take 50 mg by mouth every 6 (six) hours as needed. For pain, Disp: , Rfl:  .  vitamin C (ASCORBIC ACID) 500 MG tablet, Take 500 mg by mouth daily., Disp: , Rfl:    Allergies  Allergen Reactions  . Penicillins     itching  . Sumatriptan     REACTION: sob, headache scalp sensitivity  . Oxycodone Itching   Review of Systems   Pertinent items are noted in the HPI. Otherwise, a complete ROS is negative.  Vitals:   Vitals:   11/28/18 0739  BP: 122/64  Pulse: (!) 55  Temp: 98.2 F (36.8 C)  TempSrc: Oral  SpO2: 96%  Weight: 215 lb 9.6 oz (97.8  kg)  Height: 6\' 1"  (1.854 m)     Body mass index is 28.44 kg/m.  Physical Exam:   Physical Exam Vitals signs and nursing note reviewed.  Constitutional:      General: He is not in acute distress.    Appearance: He is well-developed.  HENT:     Head: Normocephalic and atraumatic.     Right Ear: External ear normal.     Left Ear: External ear normal.     Nose: Nose normal.  Eyes:     Conjunctiva/sclera: Conjunctivae normal.     Pupils: Pupils are equal, round, and reactive to light.  Neck:     Musculoskeletal: Normal range  of motion and neck supple.  Cardiovascular:     Rate and Rhythm: Normal rate and regular rhythm.     Heart sounds: Normal heart sounds.  Pulmonary:     [place]: Pulmonary effort is normal.     Breath sounds: Normal breath sounds.  Abdominal:     General: Bowel sounds are normal.     Palpations: Abdomen is soft.  Musculoskeletal: Normal range of motion.  Skin:    General: Skin is warm and dry.  Neurological:     Mental Status: He is alert and oriented to person, place, and time.  Psychiatric:        Behavior: Behavior normal.        Thought Content: Thought content normal.        Judgment: Judgment normal.     Results for orders placed or performed in visit on 11/28/18  POCT glycosylated hemoglobin (Hb A1C)  Result Value Ref Range   Hemoglobin A1C 6.0 (A) 4.0 - 5.6 %   HbA1c POC (<> result, manual entry)     HbA1c, POC (prediabetic range)     HbA1c, POC (controlled diabetic range)     Assessment and Plan:   Tor[place] was seen today for follow-up.  Diagnoses and all orders for this visit:  Type 2 diabetes mellitus with hyperglycemia, without long-term current use of insulin ([name]) -     POCT glycosylated hemoglobin (Hb A1C)  Hyperlipidemia associated with type 2 diabetes mellitus ([place])  Hypertension associated with diabetes ([place])  Chronic nonintractable headache, unspecified headache type Comments: New MRI with [facility]ist to recheck in  setting of vision issues.   Vision changes Comments: Cataracts removed 1.5 years ago. Unable to see at night and when golfing. Working with [employer]. Considering change to regular lens.    . Orders and follow up as documented in [place], reviewed diet, exercise and weight control, cardiovascular risk and specific lipid/LDL goals reviewed, reviewed medications and side effects in detail.  . Reviewed expectations re: course of current medical issues. . Outlined signs and symptoms indicating need for more acute intervention. . Patient verbalized understanding and all questions were answered. . Patient received an After Visit Summary.  CMA served as Education administrator during this visit. History, Physical, and Plan performed by medical provider. The above documentation has been reviewed and is accurate and complete. Briscoe Deutscher, D.O.  Briscoe Deutscher, DO [place], [facility] 11/28/2018

## 2018-11-28 NOTE — Patient Instructions (Signed)
Fat and Cholesterol Restricted Eating Plan Getting too much fat and cholesterol in your diet may cause health problems. Choosing the right foods helps keep your fat and cholesterol at normal levels. This can keep you from getting certain diseases. Your doctor may recommend an eating plan that includes:  Total fat: ______% or less of total calories a day.  Saturated fat: ______% or less of total calories a day.  Cholesterol: less than _________mg a day.  Fiber: ______g a day. What are tips for following this plan? Meal planning  At meals, divide your plate into four equal parts: ? Fill one-half of your plate with vegetables and green salads. ? Fill one-fourth of your plate with whole grains. ? Fill one-fourth of your plate with low-fat (lean) protein foods.  Eat fish that is high in omega-3 fats at least two times a week. This includes mackerel, tuna, sardines, and salmon.  Eat foods that are high in fiber, such as whole grains, beans, apples, broccoli, carrots, peas, and barley. General tips   Work with your doctor to lose weight if you need to.  Avoid: ? Foods with added sugar. ? Fried foods. ? Foods with partially hydrogenated oils.  Limit alcohol intake to no more than 1 drink a day for nonpregnant women and 2 drinks a day for men. One drink equals 12 oz of beer, 5 oz of wine, or 1 oz of hard liquor. Reading food labels  Check food labels for: ? Trans fats. ? Partially hydrogenated oils. ? Saturated fat (g) in each serving. ? Cholesterol (mg) in each serving. ? Fiber (g) in each serving.  Choose foods with healthy fats, such as: ? Monounsaturated fats. ? Polyunsaturated fats. ? Omega-3 fats.  Choose grain products that have whole grains. Look for the word "whole" as the first word in the ingredient list. Cooking  Cook foods using low-fat methods. These include baking, boiling, grilling, and broiling.  Eat more home-cooked foods. Eat at restaurants and buffets  less often.  Avoid cooking using saturated fats, such as butter, cream, palm oil, palm kernel oil, and coconut oil. Recommended foods  Fruits  All fresh, canned (in natural juice), or frozen fruits. Vegetables  Fresh or frozen vegetables (raw, steamed, roasted, or grilled). Green salads. Grains  Whole grains, such as whole wheat or whole grain breads, crackers, cereals, and pasta. Unsweetened oatmeal, bulgur, barley, quinoa, or brown rice. Corn or whole wheat flour tortillas. Meats and other protein foods  Ground beef (85% or leaner), grass-fed beef, or beef trimmed of fat. Skinless chicken or turkey. Ground chicken or turkey. Pork trimmed of fat. All fish and seafood. Egg whites. Dried beans, peas, or lentils. Unsalted nuts or seeds. Unsalted canned beans. Nut butters without added sugar or oil. Dairy  Low-fat or nonfat dairy products, such as skim or 1% milk, 2% or reduced-fat cheeses, low-fat and fat-free ricotta or cottage cheese, or plain low-fat and nonfat yogurt. Fats and oils  Tub margarine without trans fats. Light or reduced-fat mayonnaise and salad dressings. Avocado. Olive, canola, sesame, or safflower oils. The items listed above may not be a complete list of foods and beverages you can eat. Contact a dietitian for more information. Foods to avoid Fruits  Canned fruit in heavy syrup. Fruit in cream or butter sauce. Fried fruit. Vegetables  Vegetables cooked in cheese, cream, or butter sauce. Fried vegetables. Grains  White bread. White pasta. White rice. Cornbread. Bagels, pastries, and croissants. Crackers and snack foods that contain trans fat   and hydrogenated oils. Meats and other protein foods  Fatty cuts of meat. Ribs, chicken wings, bacon, sausage, bologna, salami, chitterlings, fatback, hot dogs, bratwurst, and packaged lunch meats. Liver and organ meats. Whole eggs and egg yolks. Chicken and turkey with skin. Fried meat. Dairy  Whole or 2% milk, cream,  half-and-half, and cream cheese. Whole milk cheeses. Whole-fat or sweetened yogurt. Full-fat cheeses. Nondairy creamers and whipped toppings. Processed cheese, cheese spreads, and cheese curds. Beverages  Alcohol. Sugar-sweetened drinks such as sodas, lemonade, and fruit drinks. Fats and oils  Butter, stick margarine, lard, shortening, ghee, or bacon fat. Coconut, palm kernel, and palm oils. Sweets and desserts  Corn syrup, sugars, honey, and molasses. Candy. Jam and jelly. Syrup. Sweetened cereals. Cookies, pies, cakes, donuts, muffins, and ice cream. The items listed above may not be a complete list of foods and beverages you should avoid. Contact a dietitian for more information. Summary  Choosing the right foods helps keep your fat and cholesterol at normal levels. This can keep you from getting certain diseases.  At meals, fill one-half of your plate with vegetables and green salads.  Eat high-fiber foods, like whole grains, beans, apples, carrots, peas, and barley.  Limit added sugar, saturated fats, alcohol, and fried foods. This information is not intended to replace advice given to you by your health care provider. Make sure you discuss any questions you have with your health care provider. Document Released: 05/01/2012 Document Revised: 07/04/2018 Document Reviewed: 07/18/2017 Elsevier Interactive Patient Education  2019 Elsevier Inc.   

## 2018-11-30 ENCOUNTER — Encounter: Payer: Self-pay | Admitting: Physical Therapy

## 2018-12-10 ENCOUNTER — Other Ambulatory Visit: Payer: Self-pay | Admitting: Ophthalmology

## 2018-12-10 DIAGNOSIS — H4720 Unspecified optic atrophy: Secondary | ICD-10-CM

## 2018-12-10 DIAGNOSIS — G44229 Chronic tension-type headache, not intractable: Secondary | ICD-10-CM

## 2018-12-16 ENCOUNTER — Ambulatory Visit
Admission: RE | Admit: 2018-12-16 | Discharge: 2018-12-16 | Disposition: A | Discharge status: Home / Self Care | Payer: Medicare HMO | Source: Ambulatory Visit | Attending: Ophthalmology | Admitting: Ophthalmology

## 2018-12-16 DIAGNOSIS — G44229 Chronic tension-type headache, not intractable: Secondary | ICD-10-CM

## 2018-12-16 DIAGNOSIS — H4720 Unspecified optic atrophy: Secondary | ICD-10-CM

## 2018-12-16 DIAGNOSIS — R51 Headache: Secondary | ICD-10-CM | POA: Diagnosis not present

## 2018-12-16 MED ORDER — GADOBENATE DIMEGLUMINE 529 MG/ML IV SOLN
20.0000 mL | Freq: Once | INTRAVENOUS | Status: AC | PRN
  Administered 2018-12-16: 20 mL via INTRAVENOUS

## 2018-12-20 DIAGNOSIS — T8529XA Other mechanical complication of intraocular lens, initial encounter: Secondary | ICD-10-CM | POA: Diagnosis not present

## 2018-12-20 DIAGNOSIS — Z961 Presence of intraocular lens: Secondary | ICD-10-CM | POA: Diagnosis not present

## 2019-01-13 HISTORY — PX: CATARACT EXTRACTION W/ INTRAOCULAR LENS IMPLANT: SHX1309

## 2019-01-24 DIAGNOSIS — H25811 Combined forms of age-related cataract, right eye: Secondary | ICD-10-CM | POA: Diagnosis not present

## 2019-01-24 DIAGNOSIS — Z961 Presence of intraocular lens: Secondary | ICD-10-CM | POA: Diagnosis not present

## 2019-01-24 DIAGNOSIS — T8529XA Other mechanical complication of intraocular lens, initial encounter: Secondary | ICD-10-CM | POA: Diagnosis not present

## 2019-03-13 DIAGNOSIS — M71342 Other bursal cyst, left hand: Secondary | ICD-10-CM | POA: Diagnosis not present

## 2019-03-13 DIAGNOSIS — L821 Other seborrheic keratosis: Secondary | ICD-10-CM | POA: Diagnosis not present

## 2019-03-13 DIAGNOSIS — Z85828 Personal history of other malignant neoplasm of skin: Secondary | ICD-10-CM | POA: Diagnosis not present

## 2019-03-14 NOTE — Progress Notes (Deleted)
{Choose 1 Note Type (Telehealth Visit or Telephone Visit):[id number]}   Evaluation Performed:  Follow-up visit  Date:  03/14/2019   ID:  Robert, [name] Oct 03, 1946, MRN [id number]  Patient Location: Home Provider Location: Home  PCP:  Briscoe Deutscher, DO  Cardiologist: Soliana Kitko [place] MD Electrophysiologist:  None   Chief Complaint:  Follow up CAD  History of Present Illness:    Robert [name] is a 73 y.o. male seen today for follow up CAD.  He  had a  NSTEMI with DES to the proximal RCA in November 2012. EF was normal.  Stress Myoview in March 2015 was Normal. 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    He is doing very well on follow up. He denies any chest pain or dyspnea. He remains  active. His [place] passed away. He does travel back and forth to [place] where he has a place.  The patient {does/does not:200015} have symptoms concerning for COVID-19 infection (fever, chills, cough, or new shortness of breath).    Past Medical History:  Diagnosis Date  . CAD (coronary artery disease) 10/10/2011   NSTEMI with DES to the proximal RCA following flow wire evaluation. EF is normal.   . GERD (gastroesophageal reflux disease)   . H/O esophageal ulcer   . History of stomach ulcers   . Hyperlipidemia   . Hypertension   . Migraines    "quite often; maybe q 10d to 2 wk" (01/14/2014)  . Myocardial infarction ([place]) 10/2011  . OSA on CPAP    "wear it most of the time" (01/14/2014)  . 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 Squamous cell carcinoma 12/17/2016   Past Surgical History:  Procedure Laterality Date  . BACK SURGERY    . CATARACT EXTRACTION Bilateral 2019  . CERVICAL DISC SURGERY  1980's?  . CORONARY ANGIOPLASTY WITH STENT PLACEMENT  10/13/2011   DES to the proximal RCA. Dr Martinique  . FRACTIONAL FLOW RESERVE WIRE Right 10/13/2011   Procedure: FRACTIONAL FLOW RESERVE WIRE;  Surgeon: Schuyler Behan M Martinique, MD;   [facility] CATH LAB;  Service: Cardiovascular;  Laterality: Right;  . HERNIA REPAIR    . INGUINAL HERNIA REPAIR  10/23/2012   Procedure: HERNIA REPAIR INGUINAL ADULT;  Surgeon: Odis Hollingshead, MD;  Location: [place];  Service: General;  Laterality: Left;  Left lower quadrant; left inguinal hernia repair with mesh  . INSERTION OF MESH  10/23/2012   Procedure: INSERTION OF MESH;  Surgeon: Odis Hollingshead, MD;  Location: [place];  Service: General;  Laterality: Left;  Left lower quadrant  . LEFT HEART CATHETERIZATION WITH CORONARY ANGIOGRAM N/A 10/13/2011   Procedure: LEFT HEART CATHETERIZATION WITH CORONARY ANGIOGRAM;  Surgeon: Rudie Sermons M Martinique, MD;  Location: [facility] CATH LAB;  Service: Cardiovascular;  Laterality: N/A;  . PERCUTANEOUS CORONARY STENT INTERVENTION (PCI-S) Right 10/13/2011   Procedure: PERCUTANEOUS CORONARY STENT INTERVENTION (PCI-S);  Surgeon: Ovie Eastep M [place], MD;  Location: [facility] CATH LAB;  Service: Cardiovascular;  Laterality: Right;  . SHOULDER ARTHROSCOPY W/ ROTATOR CUFF REPAIR Left 1990's  . TRIGGER FINGER RELEASE Left    3rd and 4th digits     No outpatient medications have been marked as taking for the 03/20/19 encounter (Appointment) with [place], Ranette Luckadoo M, MD.     Allergies:   Penicillins; Sumatriptan; and Oxycodone   Social History   Tobacco Use  . Smoking status: Former Smoker    Packs/day:  2.00    Years: 20.00    Pack years: 40.00    Types: Cigarettes    Last attempt to quit: 11/14/1982    Years since quitting: 36.3  . Smokeless tobacco: Never Used  Substance Use Topics  . Alcohol use: Yes    Alcohol/week: 6.0 standard drinks    Types: 6 Cans of beer per week  . Drug use: No     Family Hx: The patient's family history includes Cancer in his father and mother; Heart disease in his father.  ROS:   Please see the history of present illness.    *** All other systems reviewed and are negative.   Prior CV studies:   The following studies were reviewed  today:  ***  Labs/Other Tests and Data Reviewed:    EKG:  {EKG/Telemetry Strips Reviewed:[id number]}  Recent Labs: 03/14/2018: ALT 27  Dated 11/28/18 A1c 6%.   Recent Lipid Panel Lab Results  Component Value Date/Time   CHOL 156 03/14/2018 09:18 AM   TRIG 103 03/14/2018 09:18 AM   TRIG 72 10/18/2006 11:19 AM   HDL 61 03/14/2018 09:18 AM   CHOLHDL 2.2 04/28/2017 08:13 AM   LDLCALC 74 03/14/2018 09:18 AM   LDLDIRECT 137.0 03/31/2010 08:08 AM    Wt Readings from Last 3 Encounters:  11/28/18 215 lb 9.6 oz (97.8 kg)  08/29/18 215 lb 3.2 oz (97.6 kg)  05/23/18 214 lb 3.2 oz (97.2 kg)     Objective:    Vital Signs:  There were no vitals taken for this visit.   {HeartCare Virtual Exam (Optional):[id number]::"VITAL SIGNS:  reviewed"}  ASSESSMENT & PLAN:    1. Coronary artery disease status post drug-eluting stent to the proximal RCA in November 2012. Myoview March 2015 was normal. He remains asymptomatic. Continue ASA, statin, metoprolol. Follow up in one year.  2. Hyperlipidemia. Continue crestor and Zetia- will check fasting lab work today.  3. Hypertension well controlled.  COVID-19 Education: The signs and symptoms of COVID-19 were discussed with the patient and how to seek care for testing (follow up with PCP or arrange E-visit).  ***The importance of social distancing was discussed today.  Time:   Today, I have spent *** minutes with the patient with telehealth technology discussing the above problems.     Medication Adjustments/Labs and Tests Ordered: Current medicines are reviewed at length with the patient today.  Concerns regarding medicines are outlined above.   Tests Ordered: No orders of the defined types were placed in this encounter.   Medication Changes: No orders of the defined types were placed in this encounter.   Disposition:  Follow up {follow up:15908}  Signed, Laken Rog [place], MD  03/14/2019 8:32 AM    [facility]  [facility]

## 2019-03-19 ENCOUNTER — Telehealth: Payer: Self-pay | Admitting: *Deleted

## 2019-03-19 ENCOUNTER — Telehealth: Payer: Self-pay | Admitting: Cardiology

## 2019-03-19 NOTE — Telephone Encounter (Signed)
A message was left at 3:01 pm, re: we needed to cancel and reschedule his appointment from 03/20/19 with Dr.Jordan.

## 2019-03-20 ENCOUNTER — Telehealth: Payer: Medicare HMO | Admitting: Cardiology

## 2019-05-14 NOTE — Telephone Encounter (Signed)
Open n error °

## 2019-05-22 ENCOUNTER — Ambulatory Visit: Payer: Medicare HMO

## 2019-05-25 NOTE — Progress Notes (Signed)
Virtual Visit via Telephone Note   This visit type was conducted due to national recommendations for restrictions regarding the COVID-19 Pandemic (e.g. social distancing) in an effort to limit this patient's exposure and mitigate transmission in our community.  Due to his co-morbid illnesses, this patient is at least at moderate risk for complications without adequate follow up.  This format is felt to be most appropriate for this patient at this time.  The patient did not have access to video technology/had technical difficulties with video requiring transitioning to audio format only (telephone).  All issues noted in this document were discussed and addressed.  No physical exam could be performed with this format.  Please refer to the patient's chart for his  consent to telehealth for [facility].   Date:  05/25/2019   ID:  Robert [name], DOB Apr 07, 1946, MRN [id number]  Patient Location: Home Provider Location: Home  PCP:  Briscoe Deutscher, DO  Cardiologist:  Peter Martinique MD Electrophysiologist:  None   Evaluation Performed:  Follow-Up Visit  Chief Complaint:  Follow up CAD  History of Present Illness:    Robert [name] is a 73 y.o. male with history of CAD.  He has had a  NSTEMI with DES to the proximal RCA in November 2012. EF was normal.  Stress Myoview in March 2015 was Normal. 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   He is doing very well. No chest pain or dyspnea. He is active playing golf. No complaints.  The patient does not have symptoms concerning for COVID-19 infection (fever, chills, cough, or new shortness of breath).    Past Medical History:  Diagnosis Date  . CAD (coronary artery disease) 10/10/2011   NSTEMI with DES to the proximal RCA following flow wire evaluation. EF is normal.   . GERD (gastroesophageal reflux disease)   . H/O esophageal ulcer   . History of stomach ulcers   . Hyperlipidemia   . Hypertension    . Migraines    "quite often; maybe q 10d to 2 wk" (01/14/2014)  . Myocardial infarction ([place]) 10/2011  . OSA on CPAP    "wear it most of the time" (01/14/2014)  . 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 Squamous cell carcinoma 12/17/2016   Past Surgical History:  Procedure Laterality Date  . BACK SURGERY    . CATARACT EXTRACTION Bilateral 2019  . CERVICAL DISC SURGERY  1980's?  . CORONARY ANGIOPLASTY WITH STENT PLACEMENT  10/13/2011   DES to the proximal RCA. Dr Martinique  . FRACTIONAL FLOW RESERVE WIRE Right 10/13/2011   Procedure: FRACTIONAL FLOW RESERVE WIRE;  Surgeon: Peter M Martinique, MD;  Location: [facility] CATH LAB;  Service: Cardiovascular;  Laterality: Right;  . HERNIA REPAIR    . INGUINAL HERNIA REPAIR  10/23/2012   Procedure: HERNIA REPAIR INGUINAL ADULT;  Surgeon: Odis Hollingshead, MD;  Location: [place];  Service: General;  Laterality: Left;  Left lower quadrant; left inguinal hernia repair with mesh  . INSERTION OF MESH  10/23/2012   Procedure: INSERTION OF MESH;  Surgeon: Odis Hollingshead, MD;  Location: [place];  Service: General;  Laterality: Left;  Left lower quadrant  . LEFT HEART CATHETERIZATION WITH CORONARY ANGIOGRAM N/A 10/13/2011   Procedure: LEFT HEART CATHETERIZATION WITH CORONARY ANGIOGRAM;  Surgeon: Peter M Martinique, MD;  Location: [facility] CATH LAB;  Service: Cardiovascular;  Laterality: N/A;  . PERCUTANEOUS CORONARY STENT INTERVENTION (PCI-S)  Right 10/13/2011   Procedure: PERCUTANEOUS CORONARY STENT INTERVENTION (PCI-S);  Surgeon: Peter M Martinique, MD;  Location: [facility] CATH LAB;  Service: Cardiovascular;  Laterality: Right;  . SHOULDER ARTHROSCOPY W/ ROTATOR CUFF REPAIR Left 1990's  . TRIGGER FINGER RELEASE Left    3rd and 4th digits     No outpatient medications have been marked as taking for the 05/28/19 encounter (Appointment) with Martinique, Peter M, MD.     Allergies:   Penicillins, Sumatriptan, and Oxycodone   Social History   Tobacco Use  . Smoking status:  Former Smoker    Packs/day: 2.00    Years: 20.00    Pack years: 40.00    Types: Cigarettes    Quit date: 11/14/1982    Years since quitting: 36.5  . Smokeless tobacco: Never Used  Substance Use Topics  . Alcohol use: Yes    Alcohol/week: 6.0 standard drinks    Types: 6 Cans of beer per week  . Drug use: No     Family Hx: The patient's family history includes Cancer in his father and mother; Heart disease in his father.  ROS:   Please see the history of present illness.    All other systems reviewed and are negative.   Prior CV studies:   The following studies were reviewed today:  none  Labs/Other Tests and Data Reviewed:    EKG:  No ECG reviewed.  Recent Labs: No results found for requested labs within last 8760 hours.   Recent Lipid Panel Lab Results  Component Value Date/Time   CHOL 156 03/14/2018 09:18 AM   TRIG 103 03/14/2018 09:18 AM   TRIG 72 10/18/2006 11:19 AM   HDL 61 03/14/2018 09:18 AM   CHOLHDL 2.2 04/28/2017 08:13 AM   LDLCALC 74 03/14/2018 09:18 AM   LDLDIRECT 137.0 03/31/2010 08:08 AM    Wt Readings from Last 3 Encounters:  11/28/18 215 lb 9.6 oz (97.8 kg)  08/29/18 215 lb 3.2 oz (97.6 kg)  05/23/18 214 lb 3.2 oz (97.2 kg)     Objective:    Vital Signs:  There were no vitals taken for this visit.   VITAL SIGNS:  reviewed  ASSESSMENT & PLAN:    1. Coronary artery disease status post drug-eluting stent to the proximal RCA in November 2012. Myoview March 2015 was normal. He remains asymptomatic. Continue ASA, statin, metoprolol. Follow up in one year.  2. Hyperlipidemia. Continue crestor and Zetia. He is having a Wellness exam tomorrow with primary care. Told him to make sure chemistry panel and lipids done there.   3. Hypertension well controlled.  4. DM. Last A1c 6.0%.   COVID-19 Education: The signs and symptoms of COVID-19 were discussed with the patient and how to seek care for testing (follow up with PCP or arrange E-visit).   The importance of social distancing was discussed today.  Time:   Today, I have spent 10 minutes with the patient with telehealth technology discussing the above problems.     Medication Adjustments/Labs and Tests Ordered: Current medicines are reviewed at length with the patient today.  Concerns regarding medicines are outlined above.   Tests Ordered: No orders of the defined types were placed in this encounter.   Medication Changes: No orders of the defined types were placed in this encounter.   Follow Up:  In Person in 1 year(s)  Signed, Peter Martinique, MD  05/25/2019 7:28 AM    [facility]

## 2019-05-28 ENCOUNTER — Telehealth (INDEPENDENT_AMBULATORY_CARE_PROVIDER_SITE_OTHER): Payer: Medicare HMO | Admitting: Cardiology

## 2019-05-28 ENCOUNTER — Encounter: Payer: Self-pay | Admitting: Cardiology

## 2019-05-28 VITALS — Ht 73.0 in | Wt 214.0 lb

## 2019-05-28 DIAGNOSIS — I10 Essential (primary) hypertension: Secondary | ICD-10-CM

## 2019-05-28 DIAGNOSIS — I2510 Atherosclerotic heart disease of native coronary artery without angina pectoris: Secondary | ICD-10-CM | POA: Diagnosis not present

## 2019-05-28 DIAGNOSIS — E782 Mixed hyperlipidemia: Secondary | ICD-10-CM

## 2019-05-28 NOTE — Patient Instructions (Signed)
Medication Instructions:  Continue same medications If you need a refill on your cardiac medications before your next appointment, please call your pharmacy.   Lab work: None ordered   Testing/Procedures: None ordered  Follow-Up: At [facility], you and your health needs are our priority.  As part of our continuing mission to provide you with exceptional heart care, we have created designated Provider Care Teams.  These Care Teams include your primary Cardiologist (physician) and Advanced Practice Providers (APPs -  Physician Assistants and Nurse Practitioners) who all work together to provide you with the care you need, when you need it. . Schedule follow up appointment in 1 year     Call 3 months before to schedule   

## 2019-05-29 ENCOUNTER — Encounter: Payer: Self-pay | Admitting: Physician Assistant

## 2019-05-29 ENCOUNTER — Other Ambulatory Visit: Payer: Self-pay

## 2019-05-29 ENCOUNTER — Ambulatory Visit (INDEPENDENT_AMBULATORY_CARE_PROVIDER_SITE_OTHER): Payer: Medicare HMO | Admitting: Physician Assistant

## 2019-05-29 VITALS — BP 134/70 | HR 60 | Temp 98.5°F | Ht 73.0 in | Wt 213.2 lb

## 2019-05-29 DIAGNOSIS — Z0000 Encounter for general adult medical examination without abnormal findings: Secondary | ICD-10-CM

## 2019-05-29 DIAGNOSIS — E1165 Type 2 diabetes mellitus with hyperglycemia: Secondary | ICD-10-CM

## 2019-05-29 LAB — POCT GLYCOSYLATED HEMOGLOBIN (HGB A1C): Hemoglobin A1C: 6.1 % — AB (ref 4.0–5.6)

## 2019-05-29 NOTE — Progress Notes (Signed)
I acted as a Education administrator for [employer], PA-C Anselmo Pickler, LPN  Subjective:    Robert [name] is a 73 y.o. male who presents for Medicare Annual (Subsequent) preventive examination.  Review of Systems  Constitutional: Negative.  Negative for chills, fever, malaise/fatigue and weight loss.  HENT: Negative.  Negative for hearing loss, sinus pain and sore throat.   Eyes: Negative for blurred vision (right eye, recent cataract surgery).  Respiratory: Negative.  Negative for cough and shortness of breath.   Cardiovascular: Negative.  Negative for chest pain, palpitations and leg swelling.  Gastrointestinal: Negative.  Negative for abdominal pain, blood in stool, constipation, diarrhea, heartburn, nausea and vomiting.  Genitourinary: Negative.  Negative for dysuria, frequency and urgency.  Musculoskeletal: Negative.  Negative for back pain, myalgias and neck pain.  Skin: Negative.  Negative for itching and rash.  Neurological: Negative for dizziness, tingling, seizures, loss of consciousness and headaches (Migraines off and on).  Endo/Heme/Allergies: Negative.  Negative for polydipsia.  Psychiatric/Behavioral: Negative.  Negative for depression. The patient is not nervous/anxious.     Objective:   Vitals: BP 134/70 (BP Location: Left Arm, Patient Position: Sitting, Cuff Size: Normal)   Pulse 60   Temp 98.5 F (36.9 C) (Oral)   Ht 6\' 1"  (1.854 m)   Wt 213 lb 4 oz (96.7 kg)   SpO2 95%   BMI 28.13 kg/m   Body mass index is 28.13 kg/m.  Physical Exam Vitals signs and nursing note reviewed.  Constitutional:      General: He is not in acute distress.    Appearance: He is well-developed. He is not ill-appearing or toxic-appearing.  Cardiovascular:     Rate and Rhythm: Normal rate and regular rhythm.     Pulses: Normal pulses.     Heart sounds: Normal heart sounds, S1 normal and S2 normal.     Comments: No LE edema Pulmonary:     Effort: Pulmonary effort is normal.     Breath  sounds: Normal breath sounds.  Skin:    General: Skin is warm and dry.  Neurological:     Mental Status: He is alert.     GCS: GCS eye subscore is 4. GCS verbal subscore is 5. GCS motor subscore is 6.  Psychiatric:        Speech: Speech normal.        Behavior: Behavior normal. Behavior is cooperative.     Social History   Tobacco Use  Smoking Status Former Smoker  . Packs/day: 2.00  . Years: 20.00  . Pack years: 40.00  . Types: Cigarettes  . Quit date: 11/14/1982  . Years since quitting: 36.5  Smokeless Tobacco Never Used     Patient Active Problem List   Diagnosis Date Noted  . Vision changes 11/28/2018  . Posterior capsular opacification non visually significant of both eyes 11/13/2018  . RPE mottling of macula 11/13/2018  . Drusen of eye 11/13/2018  . Type 2 diabetes mellitus with hyperglycemia ([place]) 05/23/2018  . Chronically dry eyes, bilateral 05/23/2018  . DDD (degenerative disc disease), lumbar 07/13/2017  . Ischial bursitis 11/28/2016  . Migraines 11/28/2016  . Cervicogenic headache 09/23/2016  . Cervical radiculopathy 09/23/2016  . Degenerative disc disease, cervical 09/23/2016  . Pain syndrome, chronic 09/23/2016  . Cervical spondylolysis 06/20/2016  . Primary osteoarthritis of right hand 03/21/2016  . 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 Acquired trigger finger 10/28/2014  . Dizziness and giddiness 07/05/2014  . Flexor tenosynovitis of finger 06/23/2014  .  S/P drug eluting coronary stent placement 04/08/2014  . History of non-ST elevation myocardial infarction (NSTEMI) 10/13/2011  . Hypertension associated with diabetes ([place]) 10/11/2011  . CAD - RCA DES Nov 2012 10/10/2011  . Inguinal hernia without mention of obstruction or gangrene, unilateral or unspecified, (not specified as recurrent), left 07/27/2011  . Chronic headache disorder 01/29/2010  . History of basal cell carcinoma (BCC), right ear 03/03/2009  . Hyperlipidemia associated with  type 2 diabetes mellitus ([place]) 01/11/2008  . Allergic rhinitis 01/07/2008  . Prostatitis, recurrent 01/07/2008  . History of gastric ulcer 01/07/2008  . History of colon polyps, followed by Dr. Earlean Shawl 01/07/2008  . GERD 04/02/2007  . Sleep apnea 04/02/2007   Past Surgical History:  Procedure Laterality Date  . BACK SURGERY    . CATARACT EXTRACTION Bilateral 2019  . CATARACT EXTRACTION W/ INTRAOCULAR LENS IMPLANT Bilateral 01/2019  . CERVICAL DISC SURGERY  1980's?  . CORONARY ANGIOPLASTY WITH STENT PLACEMENT  10/13/2011   DES to the proximal RCA. Dr Martinique  . FRACTIONAL FLOW RESERVE WIRE Right 10/13/2011   Procedure: FRACTIONAL FLOW RESERVE WIRE;  Surgeon: Peter M Martinique, MD;  Location: [facility] CATH LAB;  Service: Cardiovascular;  Laterality: Right;  . HERNIA REPAIR    . INGUINAL HERNIA REPAIR  10/23/2012   Procedure: HERNIA REPAIR INGUINAL ADULT;  Surgeon: Odis Hollingshead, MD;  Location: [place];  Service: General;  Laterality: Left;  Left lower quadrant; left inguinal hernia repair with mesh  . INSERTION OF MESH  10/23/2012   Procedure: INSERTION OF MESH;  Surgeon: Odis Hollingshead, MD;  Location: [place];  Service: General;  Laterality: Left;  Left lower quadrant  . LEFT HEART CATHETERIZATION WITH CORONARY ANGIOGRAM N/A 10/13/2011   Procedure: LEFT HEART CATHETERIZATION WITH CORONARY ANGIOGRAM;  Surgeon: Peter M Martinique, MD;  Location: [facility] CATH LAB;  Service: Cardiovascular;  Laterality: N/A;  . PERCUTANEOUS CORONARY STENT INTERVENTION (PCI-S) Right 10/13/2011   Procedure: PERCUTANEOUS CORONARY STENT INTERVENTION (PCI-S);  Surgeon: Peter M Martinique, MD;  Location: [facility] CATH LAB;  Service: Cardiovascular;  Laterality: Right;  . SHOULDER ARTHROSCOPY W/ ROTATOR CUFF REPAIR Left 1990's  . TRIGGER FINGER RELEASE Left    3rd and 4th digits   Family History  Problem Relation Age of Onset  . Cancer Mother        lung  . Heart disease Father        heart attack   . Cancer Father        colon    Social History   Tobacco Use  . Smoking status: Former Smoker    Packs/day: 2.00    Years: 20.00    Pack years: 40.00    Types: Cigarettes    Quit date: 11/14/1982    Years since quitting: 36.5  . Smokeless tobacco: Never Used  Substance Use Topics  . Alcohol use: Yes    Alcohol/week: 6.0 standard drinks    Types: 6 Cans of beer per week  . Drug use: No    Current Outpatient Medications:  .  aspirin EC 81 MG tablet, Take 81 mg by mouth daily., Disp: , Rfl:  .  b complex vitamins capsule, Take by mouth., Disp: , Rfl:  .  Cholecalciferol (VITAMIN D) 2000 UNITS CAPS, Take 1 capsule by mouth daily., Disp: , Rfl:  .  diclofenac sodium (VOLTAREN) 1 % GEL, Apply 4 g topically 4 (four) times daily., Disp: 100 g, Rfl: 1 .  ezetimibe (ZETIA) 10 MG tablet, Take 5 mg  by mouth daily., Disp: , Rfl:  .  fish oil-omega-3 fatty acids 1000 MG capsule, Take 1 g by mouth daily., Disp: , Rfl:  .  levocetirizine (XYZAL) 5 MG tablet, Take 5 mg by mouth every evening. , Disp: , Rfl:  .  metoprolol tartrate (LOPRESSOR) 25 MG tablet, Take 12.5 mg by mouth daily., Disp: , Rfl:  .  Multiple Vitamins-Minerals (MULTIVITAMINS THER. W/MINERALS) TABS, Take 1 tablet by mouth daily., Disp: , Rfl:  .  nitroGLYCERIN (NITROSTAT) 0.4 MG SL tablet, Place 1 tablet (0.4 mg total) under the tongue every 5 (five) minutes as needed for chest pain. MAX 3 doses, Disp: 25 tablet, Rfl: 3 .  pantoprazole (PROTONIX) 40 MG tablet, Take 1 tablet (40 mg total) by mouth daily., Disp: 90 tablet, Rfl: 3 .  Propylene Glycol (SYSTANE BALANCE) 0.6 % SOLN, Apply 1 drop to eye QID., Disp: , Rfl:  .  rosuvastatin (CRESTOR) 40 MG tablet, Take 1 tablet (40 mg total) by mouth daily., Disp: 90 tablet, Rfl: 3 .  traMADol (ULTRAM) 50 MG tablet, Take 50 mg by mouth every 6 (six) hours as needed. For pain, Disp: , Rfl:  .  vitamin C (ASCORBIC ACID) 500 MG tablet, Take 500 mg by mouth daily., Disp: , Rfl:   Activities of Daily Living In your present  state of health, do you have any difficulty performing the following activities: 05/29/2019  Hearing? N  Vision? Y  Comment Pt sees a Retina specialist  Difficulty concentrating or making decisions? N  Walking or climbing stairs? N  Dressing or bathing? N  Doing errands, shopping? N  Preparing Food and eating ? N  Using the Toilet? N  In the past six months, have you accidently leaked urine? N  Do you have problems with loss of bowel control? N  Managing your Medications? N  Managing your Finances? N  Housekeeping or managing your Housekeeping? N  Some recent data might be hidden    Patient Care Team: Briscoe Deutscher, DO as PCP - General ([place] as Referring Physician (General Practice) Jovita Gamma, MD as Consulting Physician (Neurosurgery) Martinique, Peter M, MD as Consulting Physician (Cardiology)  Assessment:   [place] was seen today for medicare wellness.  Diagnoses and all orders for this visit:  Medicare annual wellness visit, subsequent Today patient counseled on age appropriate routine health concerns for screening and prevention, each reviewed and up to date or declined. Immunizations reviewed and up to date or declined. Labs ordered and reviewed. Risk factors for depression reviewed and negative. Hearing function and visual acuity are intact. ADLs screened and addressed as needed. Functional ability and level of safety reviewed and appropriate. Education, counseling and referrals performed based on assessed risks today. Patient provided with a copy of personalized plan for preventive services.  Type 2 diabetes mellitus with hyperglycemia, without long-term current use of insulin ([name]) -     POCT glycosylated hemoglobin (Hb A1C) Repeat HgbA1c today in office 6.1. Continue current management. Recommended reduction of sugary beverages.   Exercise Activities and Dietary recommendations Current Exercise Habits: The patient does not participate in  regular exercise at present(Pt plays golf 2-3 times a week), Exercise limited by: None identified  Fall Risk Fall Risk  05/29/2019 11/28/2018 05/16/2018 05/03/2017 11/28/2016  Falls in the past year? 0 0 Yes No No  Number falls in past yr: 0 0 2 or more - -  Comment - - Fell going down stairs at home and misplaced hand  outside and fell forward.  - -  Injury with Fall? 0 0 Yes - -  Risk Factor Category  - - High Fall Risk - -  Risk for fall due to : - - History of fall(s) - -  Follow up Falls evaluation completed;Education provided;Falls prevention discussed - Falls evaluation completed;Education provided;Falls prevention discussed - -   Is the patient's home free of loose throw rugs in walkways, pet beds, electrical cords, etc?   yes. Grab bars in the bathroom? no. Handrails on the stairs?   yes. Adequate lighting? yes.  Timed Get Up and Go performed: yes, taking < 12 seconds Note: If takes > 12 seconds to complete, patient at increased risk of falling. Note if patient has slow pace, loss of balance, short strides, little or no arm swings, steadying self on walls, shuffling, not using assist device properly.   Depression Screen PHQ 2/9 Scores 05/29/2019 11/28/2018 05/16/2018 05/03/2017  PHQ - 2 Score 0 1 1 0  PHQ- 9 Score - 2 1 -     Cognitive Function Mini-Cog     Normal clock drawing test?  yes     How many words correct?  3    Immunization History  Administered Date(s) Administered  . Influenza Whole 11/14/2005  . Influenza, High Dose Seasonal PF 09/18/2017, 09/12/2018  . Influenza-Unspecified 07/15/2013, 10/14/2016  . Pneumococcal Polysaccharide-23 03/03/2009  . Td 10/14/2006  . Tdap 11/30/2017  . Zoster 10/28/2009  . Zoster Recombinat (Shingrix) 02/12/2018, 04/14/2018   Qualifies for Shingles Vaccine? Yes Note: Contraindications include severe allergic reaction (e.g., anaphylaxis) after a previous dose or to a vaccine component, known severe immunodeficiency (e.g., from hematologic  and solid tumors, receipt of chemotherapy, congenital immunodeficiency,  long-term immunosuppressive therapy(g) or patients with HIV infection who are severely immunocompromised), and pregnancy. Screening Tests Health Maintenance  Topic Date Due  . FOOT EXAM  05/24/2019  . URINE MICROALBUMIN  05/24/2019  . HEMOGLOBIN A1C  05/29/2019  . COLONOSCOPY  08/29/2019 (Originally 05/15/2019)  . PNA vac Low Risk Adult (2 of 2 - PCV13) 05/28/2020 (Originally 03/03/2010)  . OPHTHALMOLOGY EXAM  11/14/2019  . TETANUS/TDAP  12/01/2027  . Hepatitis C Screening  Completed  . INFLUENZA VACCINE  Discontinued    Cancer Screenings: Lung: Low Dose CT Chest recommended if Age 52-80 years, 30 pack-year currently smoking OR have quit w/in 15years. Patient does not qualify. Up to date of Bone Density/Dexa? N/A  Plan:   PLEASE NOTE THAT ALL TESTS AND QUESTIONS WERE COMPLETED ON DAY OF VISIT. THIS MAY NOT REFLECT DATE OF SERVICE IF INFORMATION ENTERED ON SUBSEQUENT DAY.  I have personally reviewed and noted the following in the patient's chart:   . Medical and social history . Use of alcohol, tobacco or illicit drugs  . Current medications and supplements . Functional ability and status . Nutritional status . Physical activity . Advanced directives . List of other physicians . Hospitalizations, surgeries, and ER visits in previous 12 months . Vitals . Screenings to include cognitive, depression, and falls . Referrals and appointments  In addition, I have reviewed and discussed with patient certain preventive protocols, quality metrics, and best practice recommendations. A written personalized care plan for preventive services as well as general preventive health recommendations were provided to patient.   Inda Coke, [place]

## 2019-05-29 NOTE — Patient Instructions (Addendum)
It was great to see you!  Your A1c is:   Let's follow-up in 6 months, sooner if you have concerns.  Take care,  Inda Coke PA-C   Robert [name] , Thank you for taking time to come for your Medicare Wellness Visit. I appreciate your ongoing commitment to your health goals. Please review the following plan we discussed and let me know if I can assist you in the future.   These are the goals we discussed: Goals    . Maintain current health       This is a list of the screening recommended for you and due dates:  Health Maintenance  Topic Date Due  . Complete foot exam   05/24/2019  . Urine Protein Check  05/24/2019  . Hemoglobin A1C  05/29/2019  . Colon Cancer Screening  08/29/2019*  . Pneumonia vaccines (2 of 2 - PCV13) 05/28/2020*  . Eye exam for diabetics  11/14/2019  . Tetanus Vaccine  12/01/2027  .  Hepatitis C: One time screening is recommended by Center for Disease Control  (CDC) for  adults born from [birth year] through 1967.   Completed  . Flu Shot  Discontinued  *Topic was postponed. The date shown is not the original due date.

## 2019-08-21 ENCOUNTER — Other Ambulatory Visit: Payer: Self-pay

## 2019-08-21 DIAGNOSIS — Z20822 Contact with and (suspected) exposure to covid-19: Secondary | ICD-10-CM

## 2019-08-21 DIAGNOSIS — Z20828 Contact with and (suspected) exposure to other viral communicable diseases: Secondary | ICD-10-CM | POA: Diagnosis not present

## 2019-08-23 LAB — NOVEL CORONAVIRUS, NAA: SARS-CoV-2, NAA: NOT DETECTED

## 2019-09-18 DIAGNOSIS — M25511 Pain in right shoulder: Secondary | ICD-10-CM | POA: Diagnosis not present

## 2019-09-30 DIAGNOSIS — M25511 Pain in right shoulder: Secondary | ICD-10-CM | POA: Diagnosis not present

## 2019-10-09 DIAGNOSIS — M25511 Pain in right shoulder: Secondary | ICD-10-CM | POA: Diagnosis not present

## 2019-11-20 DIAGNOSIS — M7542 Impingement syndrome of left shoulder: Secondary | ICD-10-CM | POA: Diagnosis not present

## 2019-11-20 DIAGNOSIS — M25511 Pain in right shoulder: Secondary | ICD-10-CM | POA: Diagnosis not present

## 2019-11-20 DIAGNOSIS — M7541 Impingement syndrome of right shoulder: Secondary | ICD-10-CM | POA: Diagnosis not present

## 2019-11-20 DIAGNOSIS — M25512 Pain in left shoulder: Secondary | ICD-10-CM | POA: Diagnosis not present

## 2019-11-27 ENCOUNTER — Encounter: Payer: Self-pay | Admitting: Family Medicine

## 2019-11-27 ENCOUNTER — Other Ambulatory Visit: Payer: Self-pay

## 2019-11-27 ENCOUNTER — Ambulatory Visit (INDEPENDENT_AMBULATORY_CARE_PROVIDER_SITE_OTHER): Payer: Medicare HMO | Admitting: Family Medicine

## 2019-11-27 ENCOUNTER — Ambulatory Visit: Payer: Medicare HMO | Admitting: Family Medicine

## 2019-11-27 VITALS — BP 118/60 | HR 56 | Temp 97.2°F | Ht 73.0 in | Wt 213.5 lb

## 2019-11-27 DIAGNOSIS — E785 Hyperlipidemia, unspecified: Secondary | ICD-10-CM | POA: Diagnosis not present

## 2019-11-27 DIAGNOSIS — E1169 Type 2 diabetes mellitus with other specified complication: Secondary | ICD-10-CM | POA: Diagnosis not present

## 2019-11-27 DIAGNOSIS — I10 Essential (primary) hypertension: Secondary | ICD-10-CM

## 2019-11-27 DIAGNOSIS — G43109 Migraine with aura, not intractable, without status migrainosus: Secondary | ICD-10-CM

## 2019-11-27 DIAGNOSIS — R109 Unspecified abdominal pain: Secondary | ICD-10-CM | POA: Diagnosis not present

## 2019-11-27 DIAGNOSIS — E1159 Type 2 diabetes mellitus with other circulatory complications: Secondary | ICD-10-CM | POA: Diagnosis not present

## 2019-11-27 DIAGNOSIS — E1165 Type 2 diabetes mellitus with hyperglycemia: Secondary | ICD-10-CM | POA: Diagnosis not present

## 2019-11-27 DIAGNOSIS — I152 Hypertension secondary to endocrine disorders: Secondary | ICD-10-CM

## 2019-11-27 DIAGNOSIS — K219 Gastro-esophageal reflux disease without esophagitis: Secondary | ICD-10-CM

## 2019-11-27 MED ORDER — DICYCLOMINE HCL 10 MG PO CAPS: 10.0000 mg | ORAL_CAPSULE | Freq: Three times a day (TID) | ORAL | 1 refills | Status: DC

## 2019-11-27 NOTE — Progress Notes (Signed)
   Robert [name] is a 74 y.o. male who presents today for an office visit.  Assessment/Plan:  New/Acute Problems: Abdominal [name] No red flags.  Benign exam.  Likely bowel spasm.  Will start course of Bentyl.  Discussed reasons return to care.  Chronic Problems Addressed Today: Type 2 diabetes mellitus with hyperglycemia ([name]) A1c stable at 6.5 off medications. Follow up in 6 months.   GERD Stable.  Continue Protonix 40 mg daily.  Hyperlipidemia associated with type 2 diabetes mellitus ([name]) Continue Crestor 40 mg daily and Zetia 5 mg daily.  Check lipid panel next blood draw.  Hypertension associated with diabetes ([place]) At goal.  Continue metoprolol 12.5 mg daily.  Migraines Stable.  Continue management per neurology.     Subjective:  HPI:  He has been having some issues with abdominal cramps for the past week or so.  No obvious precipitating events.  No constipation or diarrhea.  No nausea or vomiting.  Had symptoms similar in the past that was treated with medication he does not member the name of.  # T2DM - Not currently on any medications  #Essential hypertension - On metoprolol tartrate 12.5 mg daily  # Dyslipidemia / CAD s/p NSTEMI and stent 2012 - Follows with cardiology - On aspirin 81 mg daily - On Crestor 40 mg daily and Zetia 5 mg daily. Tolerating well  # GERD -On Protonix 40 mg daily tolerating well  # Migraines - [facility] - Gets botox injections - Uses tramadol as needed  # Seasonal Allergies - On levocetirizine 5mg  daily  # Osteoarthritis  # Chronically Dry Eyes / Cataracts - Follows with ophthalmology  # History of Skin Cancer - Follows with dermatology       Objective:  Physical Exam: BP 118/60   Pulse (!) 56   Temp (!) 97.2 F (36.2 C)   Ht 6\' 1"  (1.854 m)   Wt 213 lb 8 oz (96.8 kg)   SpO2 99%   BMI 28.17 kg/m   Gen: No acute distress, resting comfortably CV: Regular rate and rhythm with no murmurs  appreciated Pulm: Normal work of breathing, clear to auscultation bilaterally with no crackles, wheezes, or rhonchi GI: S, NT, ND.  Neuro: Grossly normal, moves all extremities Psych: Normal affect and thought content  Time Spent: 45 minutes of total time was spent on the date of the encounter performing the following actions: chart review prior to seeing the patient, obtaining history, performing a medically necessary exam, counseling on the treatment plan, placing orders, and documenting in our EHR.        Robert [name]. Robert Pain, MD 11/27/2019 10:05 AM

## 2019-11-27 NOTE — Assessment & Plan Note (Signed)
Stable.  Continue Protonix 40 mg daily

## 2019-11-27 NOTE — Assessment & Plan Note (Signed)
Stable.  Continue management per neurology. 

## 2019-11-27 NOTE — Assessment & Plan Note (Signed)
Continue Crestor 40 mg daily and Zetia 5 mg daily.  Check lipid panel next blood draw.

## 2019-11-27 NOTE — Assessment & Plan Note (Signed)
At goal.  Continue metoprolol 12.5 mg daily.

## 2019-11-27 NOTE — Patient Instructions (Signed)
It was very nice to see you today!  Please start the Bentyl.  Let me know if your symptoms persist.  No other changes today.  Come back in 6 months for your checkup with blood work, or sooner if needed.  Take care, Dr Jerline Pain  Please try these tips to maintain a healthy lifestyle:   Eat at least 3 REAL meals and 1-2 snacks per day.  Aim for no more than 5 hours between eating.  If you eat breakfast, please do so within one hour of getting up.    Each meal should contain half fruits/vegetables, one quarter protein, and one quarter carbs (no bigger than a computer mouse)   Cut down on sweet beverages. This includes juice, soda, and sweet tea.     Drink at least 1 glass of water with each meal and aim for at least 8 glasses per day   Exercise at least 150 minutes every week.

## 2019-11-27 NOTE — Assessment & Plan Note (Signed)
A1c stable at 6.5 off medications. Follow up in 6 months.

## 2019-12-01 ENCOUNTER — Ambulatory Visit: Payer: Medicare HMO

## 2020-02-05 DIAGNOSIS — M25532 Pain in left wrist: Secondary | ICD-10-CM | POA: Diagnosis not present

## 2020-02-11 IMAGING — CR DG CHEST 2V
2 series · 2 of 2 positions shown · non-contrast
Comparison: Chest radiograph January 14, 2014

CLINICAL DATA: Fell down Michael steps tonight. Chest pain with
inspiration.

EXAM:
CHEST - 2 VIEW

[w chest pa]
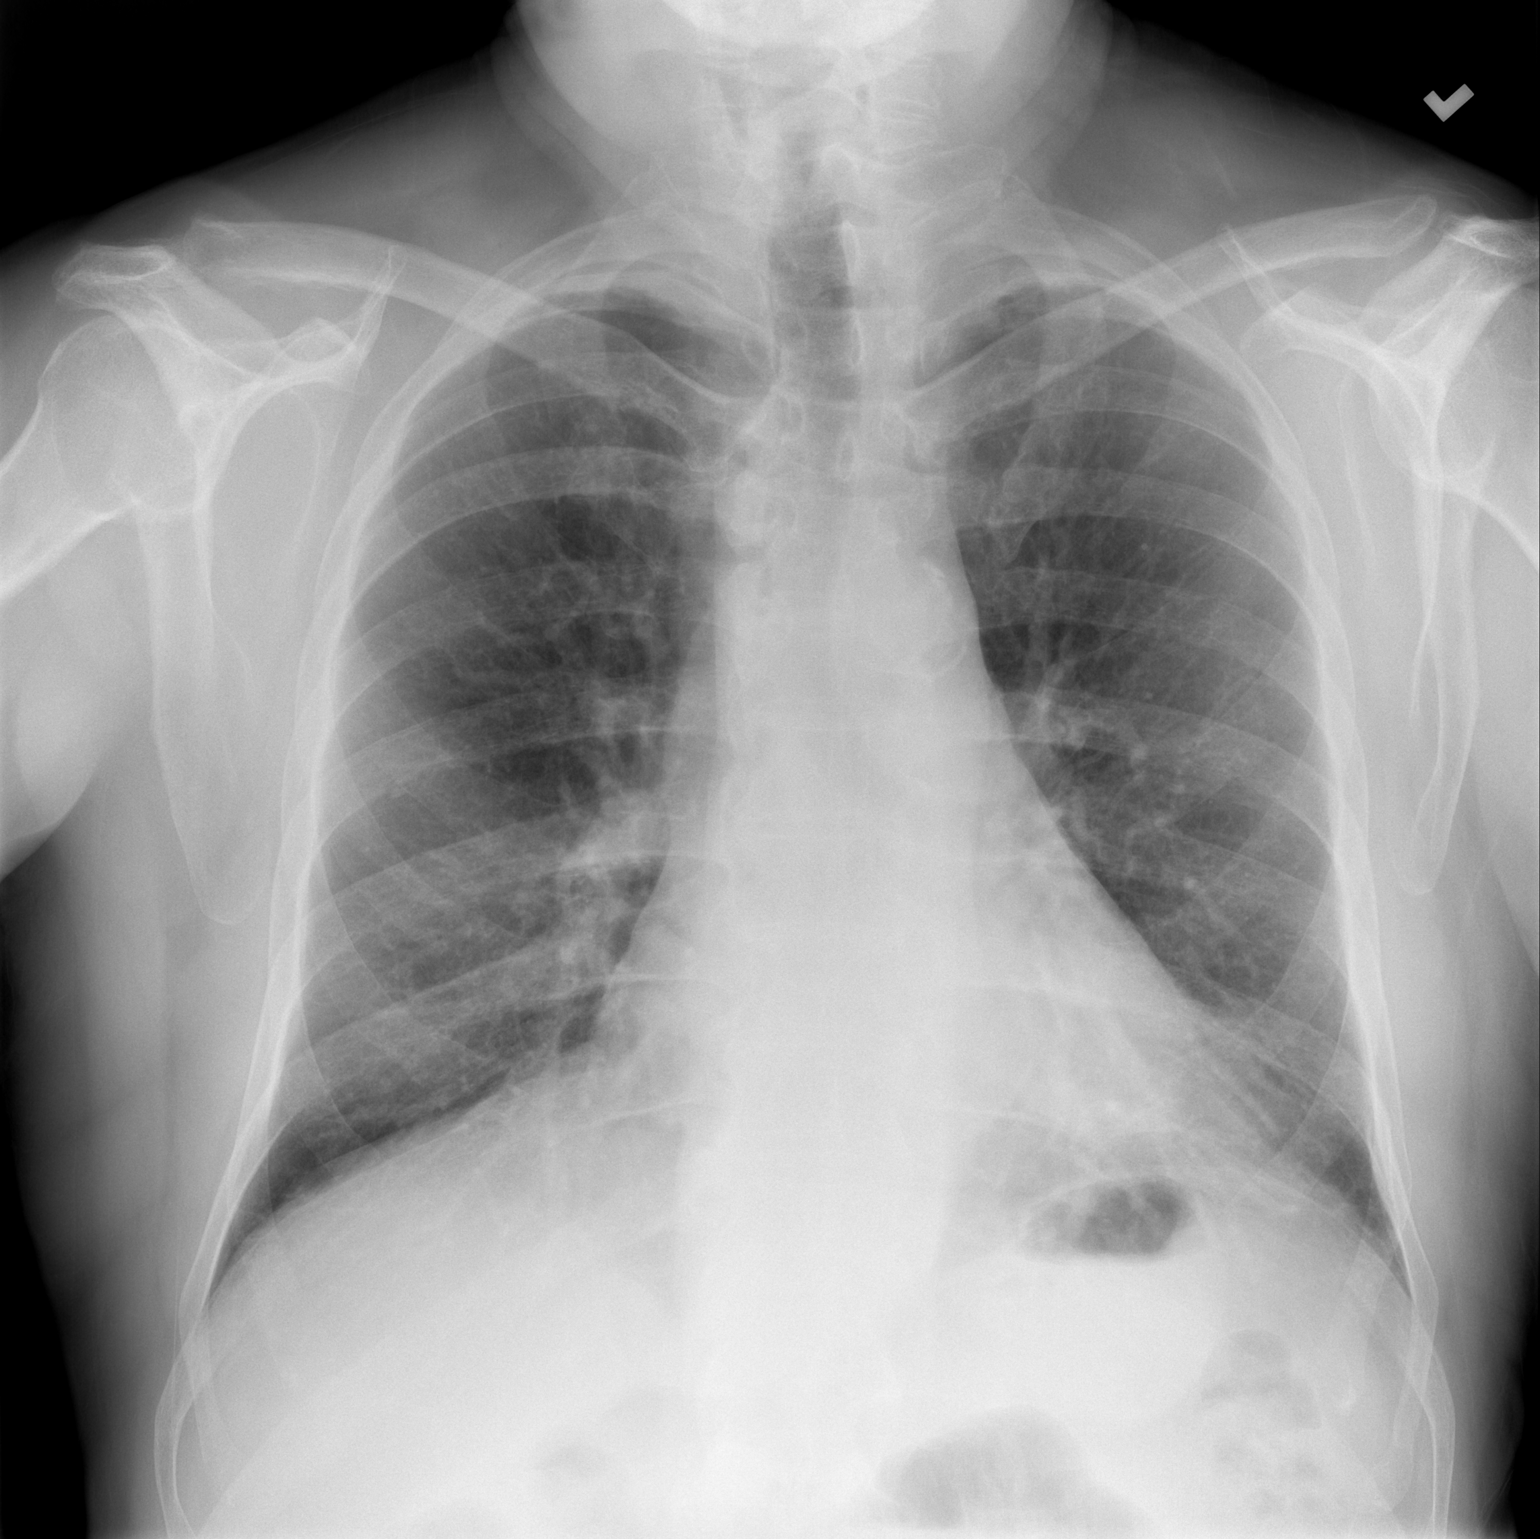

[w chest lat]
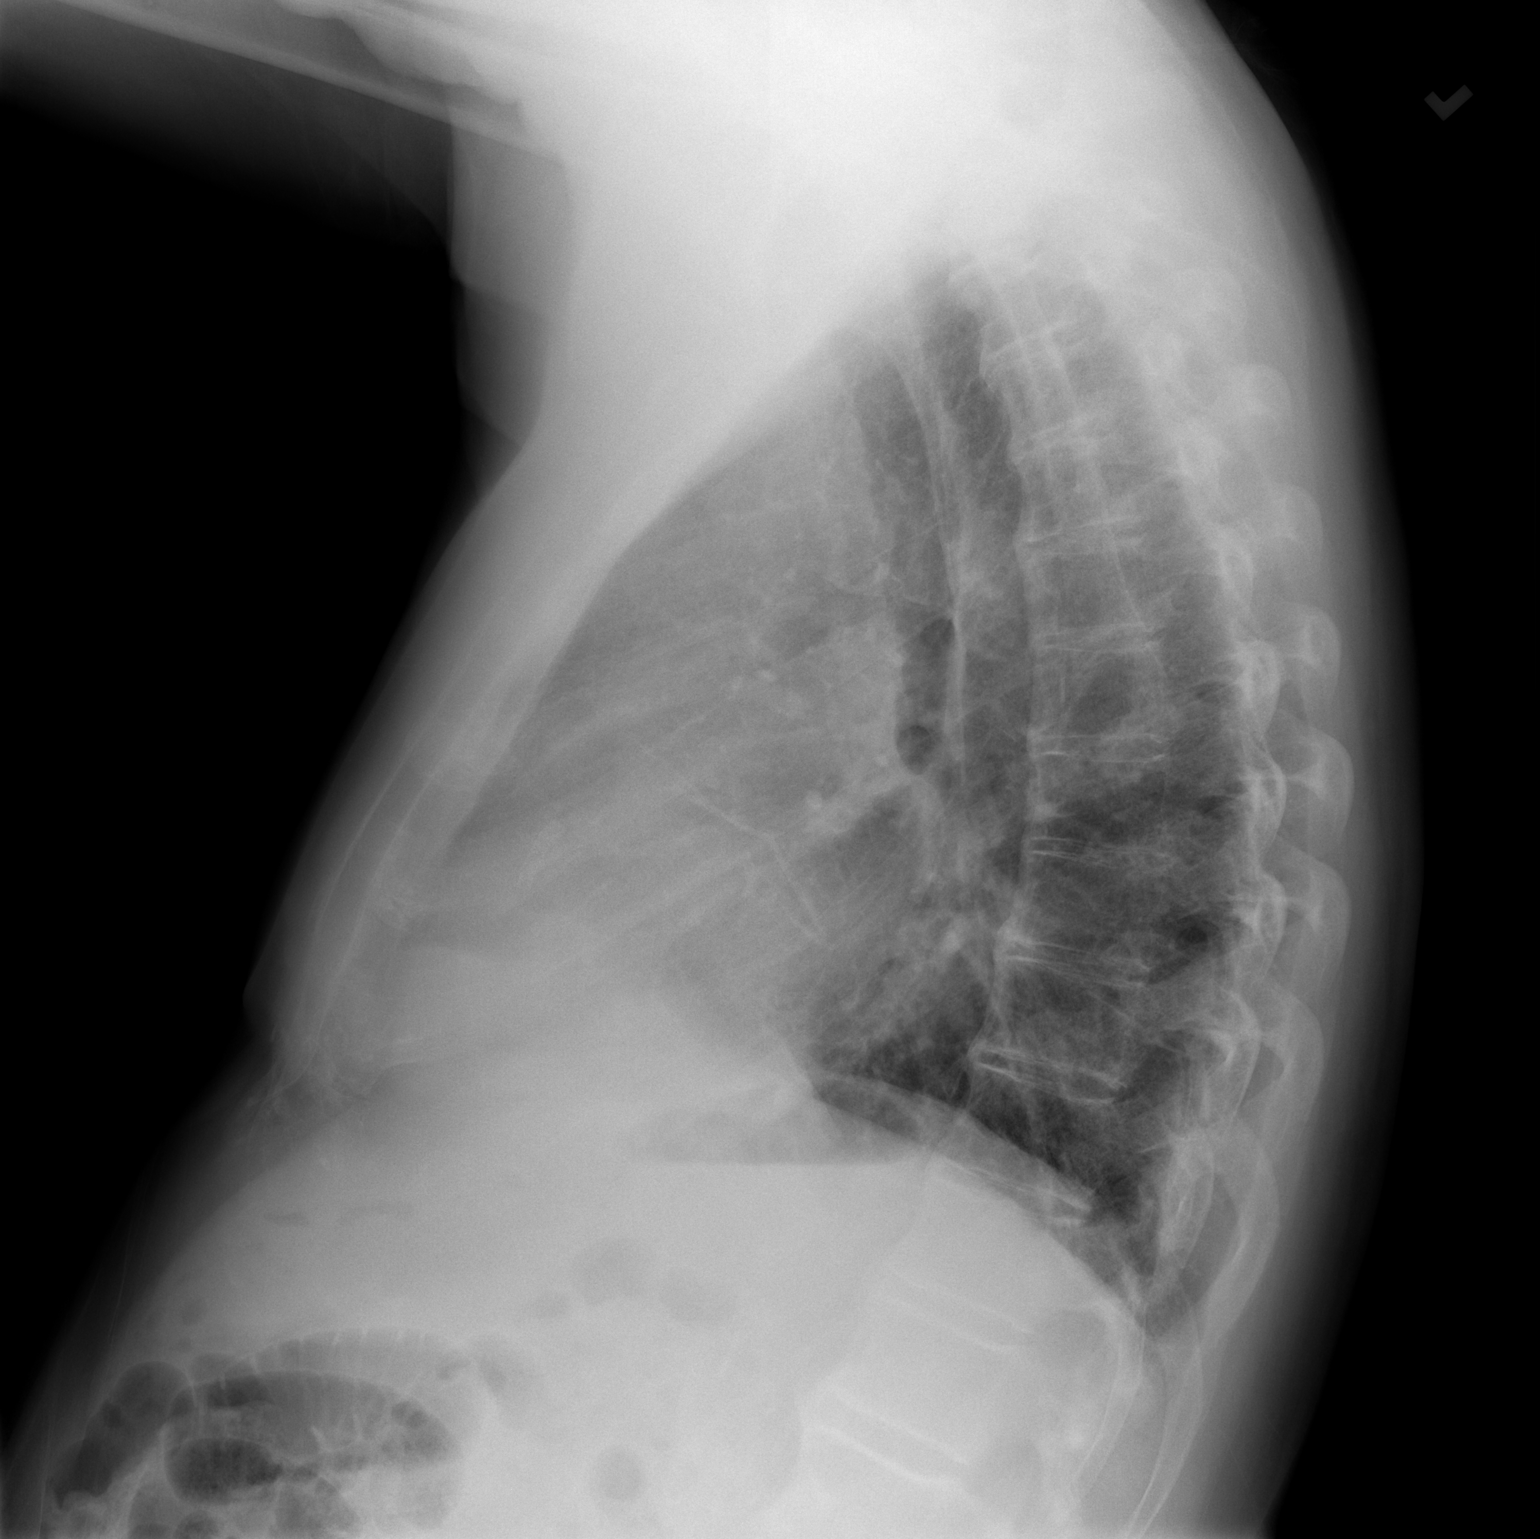

[2 of 2 positions shown; findings below may reference images not displayed]

FINDINGS: Cardiac silhouette is upper limits of normal in size. Calcified
aortic arch. No pleural effusion or focal consolidation. No
pneumothorax. Mild degenerative changes of the thoracic spine.
IMPRESSION: Borderline cardiomegaly.  No acute pulmonary process.

## 2020-02-19 DIAGNOSIS — M7541 Impingement syndrome of right shoulder: Secondary | ICD-10-CM | POA: Diagnosis not present

## 2020-02-19 DIAGNOSIS — M25511 Pain in right shoulder: Secondary | ICD-10-CM | POA: Diagnosis not present

## 2020-02-19 DIAGNOSIS — M7542 Impingement syndrome of left shoulder: Secondary | ICD-10-CM | POA: Diagnosis not present

## 2020-02-24 DIAGNOSIS — S60212D Contusion of left wrist, subsequent encounter: Secondary | ICD-10-CM | POA: Diagnosis not present

## 2020-02-24 DIAGNOSIS — M25532 Pain in left wrist: Secondary | ICD-10-CM | POA: Diagnosis not present

## 2020-02-29 ENCOUNTER — Other Ambulatory Visit: Payer: Self-pay | Admitting: Family Medicine

## 2020-05-27 ENCOUNTER — Other Ambulatory Visit: Payer: Self-pay

## 2020-05-27 ENCOUNTER — Ambulatory Visit (INDEPENDENT_AMBULATORY_CARE_PROVIDER_SITE_OTHER): Payer: Medicare HMO | Admitting: Family Medicine

## 2020-05-27 ENCOUNTER — Encounter: Payer: Self-pay | Admitting: Family Medicine

## 2020-05-27 VITALS — BP 100/60 | HR 62 | Temp 97.2°F | Ht 73.0 in | Wt 211.2 lb

## 2020-05-27 DIAGNOSIS — E1159 Type 2 diabetes mellitus with other circulatory complications: Secondary | ICD-10-CM

## 2020-05-27 DIAGNOSIS — E1165 Type 2 diabetes mellitus with hyperglycemia: Secondary | ICD-10-CM

## 2020-05-27 DIAGNOSIS — I10 Essential (primary) hypertension: Secondary | ICD-10-CM | POA: Diagnosis not present

## 2020-05-27 DIAGNOSIS — I152 Hypertension secondary to endocrine disorders: Secondary | ICD-10-CM

## 2020-05-27 LAB — POCT GLYCOSYLATED HEMOGLOBIN (HGB A1C): Hemoglobin A1C: 6.5 % — AB (ref 4.0–5.6)

## 2020-05-27 NOTE — Assessment & Plan Note (Signed)
Borderline low today.  Does not currently have any symptoms.  Would likely do well with stopping metoprolol.  He will follow-up with the [facility] doctor to discuss this.

## 2020-05-27 NOTE — Progress Notes (Signed)
   Robert [name] is a 74 y.o. male who presents today for an office visit.  Assessment/Plan:  Chronic Problems Addressed Today: Hypertension associated with diabetes ([place]) Borderline low today.  Does not currently have any symptoms.  Would likely do well with stopping metoprolol.  He will follow-up with the [place] doctor to discuss this.  Type 2 diabetes mellitus with hyperglycemia ([name]) A1c 6.5.  Continue lifestyle modifications.  Follow-up in 6 months for CPE.  Recheck at that time.    Subjective:  HPI:  See A/p.         Objective:  Physical Exam: BP 100/60   Pulse 62   Temp (!) 97.2 F (36.2 C) (Temporal)   Ht 6\' 1"  (1.854 m)   Wt 211 lb 3.2 oz (95.8 kg)   SpO2 96%   BMI 27.86 kg/m   Gen: No acute distress, resting comfortably CV: Regular rate and rhythm with no murmurs appreciated Pulm: Normal work of breathing, clear to auscultation bilaterally with no crackles, wheezes, or rhonchi Neuro: Grossly normal, moves all extremities Psych: Normal affect and thought content      Robert [name] M. Jerline Pain, MD 05/27/2020 9:58 AM

## 2020-05-27 NOTE — Assessment & Plan Note (Signed)
A1c 6.5.  Continue lifestyle modifications.  Follow-up in 6 months for CPE.  Recheck at that time.

## 2020-06-05 ENCOUNTER — Telehealth: Payer: Self-pay | Admitting: Family Medicine

## 2020-06-05 NOTE — Telephone Encounter (Signed)
Left message for patient to call back and schedule Medicare Annual Wellness Visit (AWV) either virtually/audio only OR in office. Whatever the patients preference is.  Last AWV 05/29/19; please schedule at anytime with [facility]Nurse Health Advisor at [facility].  This should be a 45 minute visit.

## 2020-06-06 NOTE — Progress Notes (Signed)
Robert [name] Date of Birth: [birth year] Medical Record #[id number]  History of Present Illness: Robert [name] is seen today for follow up CAD.  He has had a  NSTEMI with DES to the proximal RCA in November 2012. EF was normal.  Stress Myoview in March 2015 was Normal. 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   He is doing very well on follow up. He denies any chest pain or dyspnea. He remains  Active playing golf and working in his yard.  He does travel back and forth to [place] where he has a place.  Current Outpatient Medications on File Prior to Visit  Medication Sig Dispense Refill  . aspirin EC 81 MG tablet Take 81 mg by mouth daily.    Marland Kitchen b complex vitamins capsule Take by mouth.    . Cholecalciferol (VITAMIN D) 2000 UNITS CAPS Take 1 capsule by mouth daily.    . diclofenac sodium (VOLTAREN) 1 % GEL Apply 4 g topically 4 (four) times daily. 100 g 1  . dicyclomine (BENTYL) 10 MG capsule TAKE 1 CAPSULE (10 MG TOTAL) BY MOUTH 4 (FOUR) TIMES DAILY - BEFORE MEALS AND AT BEDTIME. 120 capsule 1  . ezetimibe (ZETIA) 10 MG tablet Take 5 mg by mouth daily.    . fish oil-omega-3 fatty acids 1000 MG capsule Take 1 g by mouth daily.    Marland Kitchen levocetirizine (XYZAL) 5 MG tablet Take 5 mg by mouth every evening.     . metoprolol tartrate (LOPRESSOR) 25 MG tablet Take 12.5 mg by mouth daily.    . Multiple Vitamins-Minerals (MULTIVITAMINS THER. W/MINERALS) TABS Take 1 tablet by mouth daily.    . nitroGLYCERIN (NITROSTAT) 0.4 MG SL tablet Place 1 tablet (0.4 mg total) under the tongue every 5 (five) minutes as needed for chest pain. MAX 3 doses 25 tablet 3  . pantoprazole (PROTONIX) 40 MG tablet Take 1 tablet (40 mg total) by mouth daily. 90 tablet 3  . Propylene Glycol (SYSTANE BALANCE) 0.6 % SOLN Apply 1 drop to eye QID.    Marland Kitchen traMADol (ULTRAM) 50 MG tablet Take 50 mg by mouth every 6 (six) hours as needed. For pain    . vitamin C (ASCORBIC ACID) 500 MG tablet  Take 500 mg by mouth daily.     No current facility-administered medications on file prior to visit.    Allergies  Allergen Reactions  . Penicillins     itching  . Sumatriptan     REACTION: sob, headache scalp sensitivity  . Oxycodone Itching    Past Medical History:  Diagnosis Date  . CAD (coronary artery disease) 10/10/2011   NSTEMI with DES to the proximal RCA following flow wire evaluation. EF is normal.   . GERD (gastroesophageal reflux disease)   . H/O esophageal ulcer   . History of stomach ulcers   . Hyperlipidemia   . Hypertension   . Migraines    "quite often; maybe q 10d to 2 wk" (01/14/2014)  . Myocardial infarction ([place]) 10/2011  . OSA on CPAP    "wear it most of the time" (01/14/2014)  . 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 Squamous cell carcinoma 12/17/2016    Past Surgical History:  Procedure Laterality Date  . BACK SURGERY    . CATARACT EXTRACTION Bilateral 2019  . CATARACT EXTRACTION W/ INTRAOCULAR LENS IMPLANT Bilateral 01/2019  . CERVICAL DISC SURGERY  1980's?  . CORONARY  ANGIOPLASTY WITH STENT PLACEMENT  10/13/2011   DES to the proximal RCA. Dr Martinique  . FRACTIONAL FLOW RESERVE WIRE Right 10/13/2011   Procedure: FRACTIONAL FLOW RESERVE WIRE;  Surgeon: Landis Cassaro M Martinique, MD;  Location[facility] C[facility] LAB;  Service: Cardiovascular;  Laterality: Right;  . HERNIA REPAIR    . INGUINAL HERNIA REPAIR  10/23/2012   Procedure: HERNIA REPAIR INGUINAL ADULT;  Surgeon: Odis Hollingshead, MD;  Location: [place];  Service: General;  Laterality: Left;  Left lower quadrant; left inguinal hernia repair with mesh  . INSERTION OF MESH  10/23/2012   Procedure: INSERTION OF MESH;  Surgeon: Odis Hollingshead, MD;  Location: [place];  Service: General;  Laterality: Left;  Left lower quadrant  . LEFT HEART CATHETERIZATION WITH CORONARY ANGIOGRAM N/A 10/13/2011   Procedure: LEFT HEART CATHETERIZATION WITH CORONARY ANGIOGRAM;  Surgeon: Rheba Diamond M Martinique, MD;  Location[facility] CATH  LAB;  Service: Cardiovascular;  Laterality: N/A;  . PERCUTANEOUS CORONARY STENT INTERVENTION (PCI-S) Right 10/13/2011   Procedure: PERCUTANEOUS CORONARY STENT INTERVENTION (PCI-S);  Surgeon: Theo Krumholz M Martinique, MD;  Location: [facility] CATH LAB;  Service: Cardiovascular;  Laterality: Right;  . SHOULDER ARTHROSCOPY W/ ROTATOR CUFF REPAIR Left 1990's  . TRIGGER FINGER RELEASE Left    3rd and 4th digits    Social History   Tobacco Use  Smoking Status Former Smoker  . Packs/day: 2.00  . Years: 20.00  . Pack years: 40.00  . Types: Cigarettes  . Quit date: 11/14/1982  . Years since quitting: 37.5  Smokeless Tobacco Never Used    Social History   Substance and Sexual Activity  Alcohol Use Yes  . Alcohol/week: 6.0 standard drinks  . Types: 6 Cans of beer per week    Family History  Problem Relation Age of Onset  . Cancer Mother        lung  . Heart disease Father        heart attack   . Cancer Father        colon    Review of Systems: The review of systems is per the HPI.  All other systems were reviewed and are negative.  Physical Exam: BP (!) 124/58   Pulse 52   Ht 6\' 1"  (1.854 m)   Wt (!) 210 lb (95.3 kg)   SpO2 96%   BMI 27.71 kg/m  GENERAL:  Well appearing WM in NAD HEENT:  PERRL, EOMI, sclera are clear. Oropharynx is clear. NECK:  No jugular venous distention, carotid upstroke brisk and symmetric, no bruits, no thyromegaly or adenopathy LUNGS:  Clear to auscultation bilaterally CHEST:  Unremarkable HEART:  RRR,  PMI not displaced or sustained,S1 and S2 within normal limits, no S3, no S4: no clicks, no rubs, no murmurs ABD:  Soft, nontender. BS +, no masses or bruits. No hepatomegaly, no splenomegaly EXT:  2 + pulses throughout, no edema, no cyanosis no clubbing SKIN:  Warm and dry.  No rashes NEURO:  Alert and oriented x 3. Cranial nerves II through XII intact. PSYCH:  Cognitively intact     LABORATORY DATA:  Lab Results  Component Value Date   WBC 6.1 01/26/2018    HGB 14.1 01/26/2018   HCT 40.7 01/26/2018   PLT 172 01/26/2018   GLUCOSE 150 (H) 01/26/2018   CHOL 156 03/14/2018   TRIG 103 03/14/2018   HDL 61 03/14/2018   LDLDIRECT 137.0 03/31/2010   LDLCALC 74 03/14/2018   ALT 27 03/14/2018   AST 26 03/14/2018  NA 140 01/26/2018   K 4.7 01/26/2018   CL 103 01/26/2018   CREATININE 1.02 01/26/2018   BUN 16 01/26/2018   CO2 30 01/26/2018   TSH 1.70 07/05/2014   PSA 0.87 03/31/2010   INR 0.89 01/14/2014   HGBA1C 6.5 (A) 05/27/2020   MICROALBUR <0.7 05/23/2018    Ecg: today- NSR rate 52. Normal Ecg.  I have personally reviewed and interpreted this study.  Assessment / Plan: 1. Coronary artery disease status post drug-eluting stent to the proximal RCA in November 2012. Myoview March 2015 was normal. He remains asymptomatic. Continue ASA, statin,  metoprolol. Follow up in one year.  2. Hyperlipidemia. Continue crestor and Zetia- will check fasting lab work today.  3. Hypertension well controlled.  4. DM check A1c

## 2020-06-09 ENCOUNTER — Other Ambulatory Visit: Payer: Self-pay

## 2020-06-09 ENCOUNTER — Ambulatory Visit: Payer: Medicare HMO | Admitting: Cardiology

## 2020-06-09 ENCOUNTER — Encounter: Payer: Self-pay | Admitting: Cardiology

## 2020-06-09 VITALS — BP 124/58 | HR 52 | Ht 73.0 in | Wt 210.0 lb

## 2020-06-09 DIAGNOSIS — I10 Essential (primary) hypertension: Secondary | ICD-10-CM

## 2020-06-09 DIAGNOSIS — E1165 Type 2 diabetes mellitus with hyperglycemia: Secondary | ICD-10-CM

## 2020-06-09 DIAGNOSIS — E782 Mixed hyperlipidemia: Secondary | ICD-10-CM | POA: Diagnosis not present

## 2020-06-09 DIAGNOSIS — I2510 Atherosclerotic heart disease of native coronary artery without angina pectoris: Secondary | ICD-10-CM | POA: Diagnosis not present

## 2020-06-09 MED ORDER — ROSUVASTATIN CALCIUM 40 MG PO TABS: 40.0000 mg | ORAL_TABLET | Freq: Every day | ORAL | 3 refills | Status: DC

## 2020-06-10 LAB — HEPATIC FUNCTION PANEL
ALT: 28 IU/L (ref 0–44)
AST: 24 IU/L (ref 0–40)
Albumin: 4.6 g/dL (ref 3.7–4.7)
Alkaline Phosphatase: 63 IU/L (ref 48–121)
Bilirubin Total: 0.4 mg/dL (ref 0.0–1.2)
Bilirubin, Direct: 0.14 mg/dL (ref 0.00–0.40)
Total Protein: 6.6 g/dL (ref 6.0–8.5)

## 2020-06-10 LAB — LIPID PANEL
Chol/HDL Ratio: 2.5 ratio (ref 0.0–5.0)
Cholesterol, Total: 135 mg/dL (ref 100–199)
HDL: 55 mg/dL (ref >39)
LDL Chol Calc (NIH): 59 mg/dL (ref 0–99)
Triglycerides: 118 mg/dL (ref 0–149)
VLDL Cholesterol Cal: 21 mg/dL (ref 5–40)

## 2020-06-10 LAB — BASIC METABOLIC PANEL
BUN/Creatinine Ratio: 13 (ref 10–24)
BUN: 12 mg/dL (ref 8–27)
CO2: 26 mmol/L (ref 20–29)
Calcium: 9.4 mg/dL (ref 8.6–10.2)
Chloride: 102 mmol/L (ref 96–106)
Creatinine, Ser: 0.93 mg/dL (ref 0.76–1.27)
GFR calc Af Amer: 92 mL/min/{1.73_m2} (ref >59)
GFR calc non Af Amer: 79 mL/min/{1.73_m2} (ref >59)
Glucose: 108 mg/dL — ABNORMAL HIGH (ref 65–99)
Potassium: 5 mmol/L (ref 3.5–5.2)
Sodium: 139 mmol/L (ref 134–144)

## 2020-06-10 LAB — HEMOGLOBIN A1C
Est. average glucose Bld gHb Est-mCnc: 151 mg/dL
Hgb A1c MFr Bld: 6.9 % — ABNORMAL HIGH (ref 4.8–5.6)

## 2020-07-30 DIAGNOSIS — H524 Presbyopia: Secondary | ICD-10-CM | POA: Diagnosis not present

## 2020-08-19 DIAGNOSIS — M25511 Pain in right shoulder: Secondary | ICD-10-CM | POA: Diagnosis not present

## 2020-08-19 DIAGNOSIS — M7542 Impingement syndrome of left shoulder: Secondary | ICD-10-CM | POA: Diagnosis not present

## 2020-08-19 DIAGNOSIS — M25512 Pain in left shoulder: Secondary | ICD-10-CM | POA: Diagnosis not present

## 2020-08-22 ENCOUNTER — Other Ambulatory Visit: Payer: Self-pay | Admitting: Family Medicine

## 2020-09-04 DIAGNOSIS — H524 Presbyopia: Secondary | ICD-10-CM | POA: Diagnosis not present

## 2020-09-04 DIAGNOSIS — H52223 Regular astigmatism, bilateral: Secondary | ICD-10-CM | POA: Diagnosis not present

## 2020-09-04 DIAGNOSIS — H401112 Primary open-angle glaucoma, right eye, moderate stage: Secondary | ICD-10-CM | POA: Diagnosis not present

## 2020-10-14 ENCOUNTER — Telehealth: Payer: Self-pay

## 2020-10-14 NOTE — Telephone Encounter (Signed)
Patient came by the office to see if we could send a referral sent to [facility] and to [facility] so he wouldn't have to pay out of pocket since he didn't have a referral. Paperwork was received and given to Macy.

## 2020-10-15 NOTE — Telephone Encounter (Signed)
Patient requesting referral to [facility]   Diagnostic imaging of optic nerve of eye  Fax [phone]

## 2020-10-15 NOTE — Telephone Encounter (Signed)
Ok to place referral.  Algis Greenhouse. Jerline Pain, MD 10/15/2020 9:00 AM

## 2020-10-16 ENCOUNTER — Other Ambulatory Visit: Payer: Self-pay | Admitting: *Deleted

## 2020-10-16 DIAGNOSIS — E1165 Type 2 diabetes mellitus with hyperglycemia: Secondary | ICD-10-CM

## 2020-10-16 NOTE — Telephone Encounter (Signed)
Referral placed.

## 2020-10-21 ENCOUNTER — Telehealth: Payer: Self-pay | Admitting: *Deleted

## 2020-10-21 NOTE — Telephone Encounter (Signed)
Left message to return call to our office at their convenience.  He dropped claim information from Dr Lanney Gins  Does patient want to pick paper work, mail it or not longer needed

## 2020-10-30 ENCOUNTER — Telehealth: Payer: Self-pay | Admitting: Family Medicine

## 2020-10-30 NOTE — Progress Notes (Signed)
  Chronic Care Management   Outreach Note  10/30/2020 Name: Robert [name] MRN: [id number] DOB: Jul 09, 1946  Referred by: Vivi Barrack, MD Reason for referral : No chief complaint on file.   An unsuccessful telephone outreach was attempted today. The patient was referred to the pharmacist for assistance with care management and care coordination.   Follow Up Plan:   Lauretta Grill Upstream Scheduler

## 2020-11-23 ENCOUNTER — Telehealth: Payer: Self-pay | Admitting: Family Medicine

## 2020-11-23 NOTE — Progress Notes (Signed)
  Chronic Care Management   Outreach Note  11/23/2020 Name: Robert [name] MRN: [id number] DOB: 13-Sep-1946  Referred by: Vivi Barrack, MD Reason for referral : No chief complaint on file.   A second unsuccessful telephone outreach was attempted today. The patient was referred to pharmacist for assistance with care management and care coordination.  Follow Up Plan:   Lauretta Grill Upstream Scheduler

## 2020-11-24 ENCOUNTER — Telehealth: Payer: Self-pay | Admitting: Family Medicine

## 2020-11-24 NOTE — Chronic Care Management (AMB) (Signed)
  Chronic Care Management   Note  11/24/2020 Name: PARVIN STETZER MRN: [id number] DOB: 27-Aug-1946  CORWIN KUIKEN is a 75 y.o. year old male who is a primary care patient of Vivi Barrack, MD. I reached out to [name] by phone today in response to a referral sent by Mr. Delaney Schnick Holshouser's PCP, Vivi Barrack, MD.   Mr. Montesinos was given information about Chronic Care Management services today including:  1. CCM service includes personalized support from designated clinical staff supervised by his physician, including individualized plan of care and coordination with other care providers 2. 24/7 contact phone numbers for assistance for urgent and routine care needs. 3. Service will only be billed when office clinical staff spend 20 minutes or more in a month to coordinate care. 4. Only one practitioner may furnish and bill the service in a calendar month. 5. The patient may stop CCM services at any time (effective at the end of the month) by phone call to the office staff.   Patient agreed to services and verbal consent obtained.   Follow up plan:   Lauretta Grill Upstream Scheduler

## 2020-11-26 ENCOUNTER — Ambulatory Visit: Payer: Medicare HMO | Admitting: Internal Medicine

## 2020-11-27 DIAGNOSIS — R350 Frequency of micturition: Secondary | ICD-10-CM | POA: Diagnosis not present

## 2020-11-27 DIAGNOSIS — N410 Acute prostatitis: Secondary | ICD-10-CM | POA: Diagnosis not present

## 2020-11-27 DIAGNOSIS — R3912 Poor urinary stream: Secondary | ICD-10-CM | POA: Diagnosis not present

## 2020-11-27 DIAGNOSIS — R3915 Urgency of urination: Secondary | ICD-10-CM | POA: Diagnosis not present

## 2020-12-01 ENCOUNTER — Ambulatory Visit: Payer: Medicare HMO | Admitting: Family Medicine

## 2020-12-02 ENCOUNTER — Telehealth: Payer: Self-pay

## 2020-12-02 NOTE — Chronic Care Management (AMB) (Signed)
Chronic Care Management Pharmacy Assistant   Name: Robert [name]  MRN: [id number] DOB: 02-Sep-1946  Reason for Encounter: Medication Review/ Initial Call    Robert [name],  75 y.o. , male presents for their Initial CCM visit with the clinical pharmacist via telephone due to COVID-19 Pandemic.  PCP : Vivi Barrack, MD  Allergies:   Allergies  Allergen Reactions  . Penicillins     itching  . Sumatriptan     REACTION: sob, headache scalp sensitivity  . Oxycodone Itching    Medications: Outpatient Encounter Medications as of 12/02/2020  Medication Sig  . aspirin EC 81 MG tablet Take 81 mg by mouth daily.  Marland Kitchen b complex vitamins capsule Take by mouth.  . Cholecalciferol (VITAMIN D) 2000 UNITS CAPS Take 1 capsule by mouth daily.  . diclofenac sodium (VOLTAREN) 1 % GEL Apply 4 g topically 4 (four) times daily.  Marland Kitchen dicyclomine (BENTYL) 10 MG capsule TAKE 1 CAPSULE (10 MG TOTAL) BY MOUTH 4 (FOUR) TIMES DAILY - BEFORE MEALS AND AT BEDTIME.  Marland Kitchen ezetimibe (ZETIA) 10 MG tablet Take 5 mg by mouth daily.  . fish oil-omega-3 fatty acids 1000 MG capsule Take 1 g by mouth daily.  Marland Kitchen levocetirizine (XYZAL) 5 MG tablet Take 5 mg by mouth every evening.   . metoprolol tartrate (LOPRESSOR) 25 MG tablet Take 12.5 mg by mouth daily.  . Multiple Vitamins-Minerals (MULTIVITAMINS THER. W/MINERALS) TABS Take 1 tablet by mouth daily.  . nitroGLYCERIN (NITROSTAT) 0.4 MG SL tablet Place 1 tablet (0.4 mg total) under the tongue every 5 (five) minutes as needed for chest pain. MAX 3 doses  . pantoprazole (PROTONIX) 40 MG tablet Take 1 tablet (40 mg total) by mouth daily.  Marland Kitchen Propylene Glycol (SYSTANE BALANCE) 0.6 % SOLN Apply 1 drop to eye QID.  Marland Kitchen rosuvastatin (CRESTOR) 40 MG tablet Take 1 tablet (40 mg total) by mouth daily.  . traMADol (ULTRAM) 50 MG tablet Take 50 mg by mouth every 6 (six) hours as needed. For pain  . vitamin C (ASCORBIC ACID) 500 MG tablet Take 500 mg by mouth daily.   No  facility-administered encounter medications on file as of 12/02/2020.    Current Diagnosis: Patient Active Problem List   Diagnosis Date Noted  . Type 2 diabetes mellitus with hyperglycemia ([place]) 05/23/2018  . Chronically dry eyes, bilateral 05/23/2018  . Migraines 11/28/2016  . Hypertension associated with diabetes ([place]) 10/11/2011  . History of basal cell carcinoma (BCC), right ear 03/03/2009  . Hyperlipidemia associated with type 2 diabetes mellitus ([place]) 01/11/2008  . Allergic rhinitis 01/07/2008  . Prostatitis, recurrent 01/07/2008  . History of gastric ulcer 01/07/2008  . History of colon polyps, followed by Dr. Earlean Shawl 01/07/2008  . GERD 04/02/2007  . Sleep apnea 04/02/2007    Have you seen any other providers since your last visit?   06/09/2020 [facility] Cardiology Martinique, Peter M, MD, 07/30/2020 [facility] Opt[facility], 08/19/2020 [facility] Orthopedic Surgery, Justice Britain, 09/04/2020 [facility] Optometry [facility], no medication changes noted during these visits.  Any changes in your medications or health?  Patient states he currently has an enlarged prostate. Patient states he recently had an infection last week with painful urination. Patient states he saw a provider for this last week on 11/26/2020 (doesn't remember name of provider, states first time seeing this provider) and was given an Rx for some antibiotics. Patient states he is feeling much better after starting antibiotics.   Any side effects from any  medications?   Patient states he is not currently having any side effects from any of his medications.  Do you have any symptoms or problems not managed by your medications?  Patient states he is not having any symptoms or problems not managed by his medications at this time.  Any concerns about your health right now?  Patient states he does not have any concerns about his health at this time. Patient states he feels he is doing "pretty good" for his age.  Has your provider asked  that you check blood pressure, blood sugar, or follow special diet at home?  Patient states he does not currently check his blood sugar at home, he does check his blood pressures. Patient states he will check his blood pressure about once every few weeks. Patient reports readings around 120/73. Patient states he does not follow any diet at this time. However, he states he tries to avoid sweets as they are weakness.  Do you get any type of exercise on a regular basis?  Patient states he likes to play golf 2-3 times a week.  Can you think of a goal you would like to reach for your health?  Patient states his goal is to live a long healthy life.  Do you have any problems getting your medications?  Patient states he does not have any problems getting his medications at this time. Patient states he gets most of his medications through the [place].  Is there anything that you would like to discuss during the appointment?   Patient states he would like to discuss his many medications during the appointment.  Please have your medications and supplements available during your appointment.  April D Calhoun, [facility] [place] Pharmacist Assistant [phone]  Follow-Up:  Pharmacist Review

## 2020-12-07 ENCOUNTER — Ambulatory Visit: Payer: Medicare HMO

## 2020-12-07 DIAGNOSIS — E1169 Type 2 diabetes mellitus with other specified complication: Secondary | ICD-10-CM

## 2020-12-07 DIAGNOSIS — E1159 Type 2 diabetes mellitus with other circulatory complications: Secondary | ICD-10-CM

## 2020-12-07 DIAGNOSIS — I152 Hypertension secondary to endocrine disorders: Secondary | ICD-10-CM

## 2020-12-07 DIAGNOSIS — E1165 Type 2 diabetes mellitus with hyperglycemia: Secondary | ICD-10-CM

## 2020-12-07 DIAGNOSIS — E785 Hyperlipidemia, unspecified: Secondary | ICD-10-CM

## 2020-12-07 MED ORDER — DICYCLOMINE HCL 10 MG PO CAPS: 10.0000 mg | ORAL_CAPSULE | Freq: Three times a day (TID) | ORAL | 1 refills | Status: DC

## 2020-12-07 NOTE — Patient Instructions (Signed)
Mr. Rauth,  Thank you for taking the time to review your medications with me today.  I have included our care plan/goals in the following pages. Please review and call me at [phone] with any questions!  Thanks! Ellin Mayhew, Pharm.D., BCGP Clinical Pharmacist [facility] [phone]   Goals Addressed            This Visit's Progress   . PharmD Care Plan       CARE PLAN ENTRY (see longitudinal plan of care for additional care plan information)  Current Barriers:  . Chronic Disease Management support, education, and care coordination needs related to Hypertension, Hyperlipidemia, and Diabetes   Hypertension BP Readings from Last 3 Encounters:  06/09/20 (!) 124/58  05/27/20 100/60  11/27/19 118/60   . Pharmacist Clinical Goal(s): o Over the next 365 days, patient will work with PharmD and providers to maintain BP goal <130/80 . Current regimen:  o Metoprolol tartrate 12.5 mg once daily . Interventions: o Diet/exercise review - Maintain a healthy weight and exercise regularly, as directed by your health care provider. Eat healthy foods, such as: Lean proteins, complex carbohydrates, fresh fruits and vegetables, low-fat dairy products, healthy fats. . Patient self care activities - Over the next 365 days, patient will: o Check BP at least once every 1-2 weeks, document, and provide at future appointments o Ensure daily salt intake < 2300 mg/day  Hyperlipidemia Lab Results  Component Value Date/Time   LDLCALC 59 06/09/2020 10:54 AM   LDLDIRECT 137.0 03/31/2010 08:08 AM   . Pharmacist Clinical Goal(s): o Over the next 180 days, patient will work with PharmD and providers to maintain LDL goal < 70 . Current regimen:  o Rosuvastatin 40 mg once daily  o Zetia 10 mg once daily  . Interventions: o Side effect review - no problems noted . Patient self care activities - Over the next 365 days, patient will: o Continue current  management  Diabetes Lab Results  Component Value Date/Time   HGBA1C 6.9 (H) 06/09/2020 10:54 AM   HGBA1C 6.5 (A) 05/27/2020 09:25 AM   HGBA1C 6.1 (A) 05/29/2019 10:10 AM   HGBA1C 6.7 (H) 01/26/2018 03:55 PM   . Pharmacist Clinical Goal(s): o Over the next 180 days, patient will work with PharmD and providers to achieve A1c goal <6.5% . Current regimen:  o N/a . Interventions: o Reviewed goals for a1c and fasting blood sugar . Patient self care activities - Over the next 180 days, patient will: o Check blood sugar if directed, document, and provide at future appointments o Contact provider with any episodes of hypoglycemia  Medication management . Pharmacist Clinical Goal(s): o Over the next 365 days, patient will work with PharmD and providers to maintain optimal medication adherence . Current pharmacy: [facility] . Interventions o Comprehensive medication review performed. o Continue current medication management strategy . Patient self care activities - Over the next 365 days, patient will: o Focus on medication adherence by taking medications as prescribed o Report any questions or concerns to PharmD and/or provider(s) Initial goal documentation.      Mr. Rill was given information about Chronic Care Management services today including:  1. CCM service includes personalized support from designated clinical staff supervised by his physician, including individualized plan of care and coordination with other care providers 2. 24/7 contact phone numbers for assistance for urgent and routine care needs. 3. Standard insurance, coinsurance, copays and deductibles apply  for chronic care management only during months in which we provide at least 20 minutes of these services. Most insurances cover these services at 100%, however patients may be responsible for any copay, coinsurance and/or deductible if applicable. This service may help you avoid the need for more expensive  face-to-face services. 4. Only one practitioner may furnish and bill the service in a calendar month. 5. The patient may stop CCM services at any time (effective at the end of the month) by phone call to the office staff.  Patient agreed to services and verbal consent obtained.   The patient verbalized understanding of instructions provided today and agreed to receive a MyChart copy of patient instruction and/or educational materials. Telephone follow up appointment with pharmacy team member scheduled for: See next appointment with "Care Management Staff" under "What's Next" below.   Madelin Rear, Pharm.D., BCGP Clinical Pharmacist [facility] [phone]  Diabetes Mellitus and Nutrition, Adult When you have diabetes, or diabetes mellitus, it is very important to have healthy eating habits because your blood sugar (glucose) levels are greatly affected by what you eat and drink. Eating healthy foods in the right amounts, at about the same times every day, can help you:  Control your blood glucose.  Lower your risk of heart disease.  Improve your blood pressure.  Reach or maintain a healthy weight. What can affect my meal plan? Every person with diabetes is different, and each person has different needs for a meal plan. Your health care provider may recommend that you work with a dietitian to make a meal plan that is best for you. Your meal plan may vary depending on factors such as:  The calories you need.  The medicines you take.  Your weight.  Your blood glucose, blood pressure, and cholesterol levels.  Your activity level.  Other health conditions you have, such as heart or kidney disease. How do carbohydrates affect me? Carbohydrates, also called carbs, affect your blood glucose level more than any other type of food. Eating carbs naturally raises the amount of glucose in your blood. Carb counting is a method for keeping track of how many carbs  you eat. Counting carbs is important to keep your blood glucose at a healthy level, especially if you use insulin or take certain oral diabetes medicines. It is important to know how many carbs you can safely have in each meal. This is different for every person. Your dietitian can help you calculate how many carbs you should have at each meal and for each snack. How does alcohol affect me? Alcohol can cause a sudden decrease in blood glucose (hypoglycemia), especially if you use insulin or take certain oral diabetes medicines. Hypoglycemia can be a life-threatening condition. Symptoms of hypoglycemia, such as sleepiness, dizziness, and confusion, are similar to symptoms of having too much alcohol.  Do not drink alcohol if: ? Your health care provider tells you not to drink. ? You are pregnant, may be pregnant, or are planning to become pregnant.  If you drink alcohol: ? Do not drink on an empty stomach. ? Limit how much you use to:  0-1 drink a day for women.  0-2 drinks a day for men. ? Be aware of how much alcohol is in your drink. In the [place] one drink equals one 12 oz bottle of beer (355 mL), one 5 oz glass of wine (148 mL), or one 1 oz glass of hard liquor (44 mL). ? Keep yourself  hydrated with water, diet soda, or unsweetened iced tea.  Keep in mind that regular soda, juice, and other mixers may contain a lot of sugar and must be counted as carbs. What are tips for following this plan? Reading food labels  Start by checking the serving size on the "Nutrition Facts" label of packaged foods and drinks. The amount of calories, carbs, fats, and other nutrients listed on the label is based on one serving of the item. Many items contain more than one serving per package.  Check the total grams (g) of carbs in one serving. You can calculate the number of servings of carbs in one serving by dividing the total carbs by 15. For example, if a food has 30 g of total carbs per serving, it would  be equal to 2 servings of carbs.  Check the number of grams (g) of saturated fats and trans fats in one serving. Choose foods that have a low amount or none of these fats.  Check the number of milligrams (mg) of salt (sodium) in one serving. Most people should limit total sodium intake to less than 2,300 mg per day.  Always check the nutrition information of foods labeled as "low-fat" or "nonfat." These foods may be higher in added sugar or refined carbs and should be avoided.  Talk to your dietitian to identify your daily goals for nutrients listed on the label. Shopping  Avoid buying canned, pre-made, or processed foods. These foods tend to be high in fat, sodium, and added sugar.  Shop around the outside edge of the grocery store. This is where you will most often find fresh fruits and vegetables, bulk grains, fresh meats, and fresh dairy. Cooking  Use low-heat cooking methods, such as baking, instead of high-heat cooking methods like deep frying.  Cook using healthy oils, such as olive, canola, or sunflower oil.  Avoid cooking with butter, cream, or high-fat meats. Meal planning  Eat meals and snacks regularly, preferably at the same times every day. Avoid going long periods of time without eating.  Eat foods that are high in fiber, such as fresh fruits, vegetables, beans, and whole grains. Talk with your dietitian about how many servings of carbs you can eat at each meal.  Eat 4-6 oz (112-168 g) of lean protein each day, such as lean meat, chicken, fish, eggs, or tofu. One ounce (oz) of lean protein is equal to: ? 1 oz (28 g) of meat, chicken, or fish. ? 1 egg. ?  cup (62 g) of tofu.  Eat some foods each day that contain healthy fats, such as avocado, nuts, seeds, and fish.   What foods should I eat? Fruits Berries. Apples. Oranges. Peaches. Apricots. Plums. Grapes. Mango. Papaya. Pomegranate. Kiwi. Cherries. Vegetables Lettuce. Spinach. Leafy greens, including kale, chard,  collard greens, and mustard greens. Beets. Cauliflower. Cabbage. Broccoli. Carrots. Green beans. Tomatoes. Peppers. Onions. Cucumbers. Brussels sprouts. Grains Whole grains, such as whole-wheat or whole-grain bread, crackers, tortillas, cereal, and pasta. Unsweetened oatmeal. Quinoa. Brown or wild rice. Meats and other proteins Seafood. Poultry without skin. Lean cuts of poultry and beef. Tofu. Nuts. Seeds. Dairy Low-fat or fat-free dairy products such as milk, yogurt, and cheese. The items listed above may not be a complete list of foods and beverages you can eat. Contact a dietitian for more information. What foods should I avoid? Fruits Fruits canned with syrup. Vegetables Canned vegetables. Frozen vegetables with butter or cream sauce. Grains Refined white flour and flour products such as  bread, pasta, snack foods, and cereals. Avoid all processed foods. Meats and other proteins Fatty cuts of meat. Poultry with skin. Breaded or fried meats. Processed meat. Avoid saturated fats. Dairy Full-fat yogurt, cheese, or milk. Beverages Sweetened drinks, such as soda or iced tea. The items listed above may not be a complete list of foods and beverages you should avoid. Contact a dietitian for more information. Questions to ask a health care provider  Do I need to meet with a diabetes educator?  Do I need to meet with a dietitian?  What number can I call if I have questions?  When are the best times to check my blood glucose? Where to find more information:  American Diabetes Association: diabetes.org  Academy of Nutrition and Dietetics: www.eatright.[employer] of Diabetes and Digestive and Kidney Diseases: [employer]  Association of Diabetes Care and Education Specialists: www.diabeteseducator.org Summary  It is important to have healthy eating habits because your blood sugar (glucose) levels are greatly affected by what you eat and drink.  A healthy meal  plan will help you control your blood glucose and maintain a healthy lifestyle.  Your health care provider may recommend that you work with a dietitian to make a meal plan that is best for you.  Keep in mind that carbohydrates (carbs) and alcohol have immediate effects on your blood glucose levels. It is important to count carbs and to use alcohol carefully. This information is not intended to replace advice given to you by your health care provider. Make sure you discuss any questions you have with your health care provider. Document Revised: 10/08/2019 Document Reviewed: 10/08/2019 Elsevier Patient Education  2021 [employer].

## 2020-12-07 NOTE — Progress Notes (Signed)
Chronic Care Management Pharmacy Name: [facility]     MRN: [id number]     DOB: 1945-12-24  Chief Complaint/ HPI Robert [name], 75 y.o., male, presents for their initial CCM visit with the clinical pharmacist via telephone due to COVID-19 pandemic.  PCP: Vivi Barrack, MD Encounter Diagnoses  Name Primary?  . Type 2 diabetes mellitus with hyperglycemia, without long-term current use of insulin ([place]) Yes  . Hypertension associated with diabetes ([place])   . Hyperlipidemia associated with type 2 diabetes mellitus ([place])     Office Visits:  05/27/2020 (PCP): HTN - BP borderline low, no symptoms. Pt to follow-up with [place] doctor regarding stopping metoprolol.   Patient Active Problem List   Diagnosis Date Noted  . Type 2 diabetes mellitus with hyperglycemia ([place]) 05/23/2018  . Chronically dry eyes, bilateral 05/23/2018  . Migraines 11/28/2016  . Hypertension associated with diabetes ([place]) 10/11/2011  . History of basal cell carcinoma (BCC), right ear 03/03/2009  . Hyperlipidemia associated with type 2 diabetes mellitus ([place]) 01/11/2008  . Allergic rhinitis 01/07/2008  . Prostatitis, recurrent 01/07/2008  . History of gastric ulcer 01/07/2008  . History of colon polyps, followed by Dr. Earlean Shawl 01/07/2008  . GERD 04/02/2007  . Sleep apnea 04/02/2007   Past Surgical History:  Procedure Laterality Date  . BACK SURGERY    . CATARACT EXTRACTION Bilateral 2019  . CATARACT EXTRACTION W/ INTRAOCULAR LENS IMPLANT Bilateral 01/2019  . CERVICAL DISC SURGERY  1980's?  . CORONARY ANGIOPLASTY WITH STENT PLACEMENT  10/13/2011   DES to the proximal RCA. Dr Martinique  . FRACTIONAL FLOW RESERVE WIRE Right 10/13/2011   Procedure: FRACTIONAL FLOW RESERVE WIRE;  Surgeon: Peter M Martinique, MD;  Location: [facility] CATH LAB;  Service: Cardiovascular;  Laterality: Right;  . HERNIA REPAIR    . INGUINAL HERNIA REPAIR  10/23/2012   Procedure: HERNIA REPAIR INGUINAL ADULT;  Surgeon: Odis Hollingshead, MD;  Location: [place];   Service: General;  Laterality: Left;  Left lower quadrant; left inguinal hernia repair with mesh  . INSERTION OF MESH  10/23/2012   Procedure: INSERTION OF MESH;  Surgeon: Odis Hollingshead, MD;  Location: [place];  Service: General;  Laterality: Left;  Left lower quadrant  . LEFT HEART CATHETERIZATION WITH CORONARY ANGIOGRAM N/A 10/13/2011   Procedure: LEFT HEART CATHETERIZATION WITH CORONARY ANGIOGRAM;  Surgeon: Peter M Martinique, MD;  Location: [facility] CATH LAB;  Service: Cardiovascular;  Laterality: N/A;  . PERCUTANEOUS CORONARY STENT INTERVENTION (PCI-S) Right 10/13/2011   Procedure: PERCUTANEOUS CORONARY STENT INTERVENTION (PCI-S);  Surgeon: Peter M Martinique, MD;  Location: [facility] CATH LAB;  Service: Cardiovascular;  Laterality: Right;  . SHOULDER ARTHROSCOPY W/ ROTATOR CUFF REPAIR Left 1990's  . TRIGGER FINGER RELEASE Left    3rd and 4th digits   Family History  Problem Relation Age of Onset  . Cancer Mother        lung  . Heart disease Father        heart attack   . Cancer Father        colon   Social History   Social History Narrative   Pt lives with his wife. 1 and a half story home  No issues with stairs. 12th grade education   Allergies  Allergen Reactions  . Penicillins     itching  . Sumatriptan     REACTION: sob, headache scalp sensitivity  . Oxycodone Itching   Outpatient Encounter Medications as of 12/07/2020  Medication Sig  . aspirin EC  81 MG tablet Take 81 mg by mouth daily.  Marland Kitchen b complex vitamins capsule Take by mouth.  . Cholecalciferol (VITAMIN D) 2000 UNITS CAPS Take 1 capsule by mouth daily.  Marland Kitchen ezetimibe (ZETIA) 10 MG tablet Take 5 mg by mouth daily.  . fluticasone (FLONASE) 50 MCG/ACT nasal spray Place into both nostrils daily.  Javier Docker Oil 500 MG CAPS Take by mouth daily.  . metoprolol tartrate (LOPRESSOR) 25 MG tablet Take 12.5 mg by mouth daily.  . Multiple Vitamins-Minerals (MULTIVITAMINS THER. W/MINERALS) TABS Take 1 tablet by mouth daily.  . pantoprazole  (PROTONIX) 40 MG tablet Take 1 tablet (40 mg total) by mouth daily.  . rosuvastatin (CRESTOR) 40 MG tablet Take 1 tablet (40 mg total) by mouth daily.  . traMADol (ULTRAM) 50 MG tablet Take 50 mg by mouth every 6 (six) hours as needed. For pain  . vitamin C (ASCORBIC ACID) 500 MG tablet Take 500 mg by mouth daily.  . diclofenac sodium (VOLTAREN) 1 % GEL Apply 4 g topically 4 (four) times daily.  Marland Kitchen dicyclomine (BENTYL) 10 MG capsule TAKE 1 CAPSULE (10 MG TOTAL) BY MOUTH 4 (FOUR) TIMES DAILY - BEFORE MEALS AND AT BEDTIME.  . nitroGLYCERIN (NITROSTAT) 0.4 MG SL tablet Place 1 tablet (0.4 mg total) under the tongue every 5 (five) minutes as needed for chest pain. MAX 3 doses  . Propylene Glycol (SYSTANE BALANCE) 0.6 % SOLN Apply 1 drop to eye QID.  . [DISCONTINUED] fish oil-omega-3 fatty acids 1000 MG capsule Take 1 g by mouth daily.  . [DISCONTINUED] levocetirizine (XYZAL) 5 MG tablet Take 5 mg by mouth every evening.    No facility-administered encounter medications on file as of 12/07/2020.   Patient Care Team    Relationship Specialty Notifications Start End  Vivi Barrack, MD PCP - General Family Medicine  11/27/19   [facility] Referring Physician General Practice  08/29/18   Jovita Gamma, MD Consulting Physician Neurosurgery  08/29/18   Martinique, Peter M, [place] Physician Cardiology  08/29/18   Marica Otter, OD  Optometry  10/16/20   Madelin Rear, [facility] Pharmacist Pharmacist  11/24/20    Current Diagnosis/Assessment: Goals Addressed            This Visit's Progress   . PharmD Care Plan       CARE PLAN ENTRY (see longitudinal plan of care for additional care plan information)  Current Barriers:  . Chronic Disease Management support, education, and care coordination needs related to Hypertension, Hyperlipidemia, and Diabetes   Hypertension BP Readings from Last 3 Encounters:  06/09/20 (!) 124/58  05/27/20 100/60  11/27/19 118/60   . Pharmacist Clinical  Goal(s): o Over the next 365 days, patient will work with PharmD and providers to maintain BP goal <130/80 . Current regimen:  o Metoprolol tartrate 12.5 mg once daily . Interventions: o Diet/exercise review - Maintain a healthy weight and exercise regularly, as directed by your health care provider. Eat healthy foods, such as: Lean proteins, complex carbohydrates, fresh fruits and vegetables, low-fat dairy products, healthy fats. . Patient self care activities - Over the next 365 days, patient will: o Check BP at least once every 1-2 weeks, document, and provide at future appointments o Ensure daily salt intake < 2300 mg/day  Hyperlipidemia Lab Results  Component Value Date/Time   LDLCALC 59 06/09/2020 10:54 AM   LDLDIRECT 137.0 03/31/2010 08:08 AM   . Pharmacist Clinical Goal(s): o Over the next 180 days, patient will work  with PharmD and providers to maintain LDL goal < 70 . Current regimen:  o Rosuvastatin 40 mg once daily  o Zetia 10 mg once daily  . Interventions: o Side effect review - no problems noted . Patient self care activities - Over the next 365 days, patient will: o Continue current management  Diabetes Lab Results  Component Value Date/Time   HGBA1C 6.9 (H) 06/09/2020 10:54 AM   HGBA1C 6.5 (A) 05/27/2020 09:25 AM   HGBA1C 6.1 (A) 05/29/2019 10:10 AM   HGBA1C 6.7 (H) 01/26/2018 03:55 PM   . Pharmacist Clinical Goal(s): o Over the next 180 days, patient will work with PharmD and providers to achieve A1c goal <6.5% . Current regimen:  o N/a . Interventions: o Reviewed goals for a1c and fasting blood sugar . Patient self care activities - Over the next 180 days, patient will: o Check blood sugar if directed, document, and provide at future appointments o Contact provider with any episodes of hypoglycemia  Medication management . Pharmacist Clinical Goal(s): o Over the next 365 days, patient will work with PharmD and providers to maintain optimal medication  adherence . Current pharmacy: [facility] . Interventions o Comprehensive medication review performed. o Continue current medication management strategy . Patient self care activities - Over the next 365 days, patient will: o Focus on medication adherence by taking medications as prescribed o Report any questions or concerns to PharmD and/or provider(s) Initial goal documentation.      Hypertension   BP goal <130/80  BP Readings from Last 3 Encounters:  06/09/20 (!) 124/58  05/27/20 100/60  11/27/19 118/60    BMP Latest Ref Rng & Units 06/09/2020 01/26/2018 01/18/2017  Glucose 65 - 99 mg/dL 108(H) 150(H) 106(H)  BUN 8 - 27 mg/dL _0 Creatinine 0.76 - 1.27 mg/dL 0.93 1.02 1.08  BUN/Creat Ratio 10 - 24 13 NOT APPLICABLE -  Sodium 263 - 144 mmol/L 139 140 139  Potassium 3.5 - 5.2 mmol/L 5.0 4.7 5.2  Chloride 96 - 106 mmol/L 102 103 100  CO2 20 - 29 mmol/L 26 30 32(H)  Calcium 8.6 - 10.2 mg/dL 9.4 9.5 9.4   Followed by [facility] Doctor. Previous medications: none noted.  Diet: tries to avoid sweets, not adding salt to anything. No fast food but goes to [employer]. Father diet at 58 of massive heart attack, brother. Exercise: golf 2-3x/week. Outside activities. Patient checks BP at home several times per month. Recent home readings: 120s/70s. BP medications have not been adjusted since last visit with PCP.  Blood Patient is currently at goal on the following medications:  Marland Kitchen Metoprolol tartrate 25 mg tablet - 12.5 mg daily    We discussed diet/exercise - Maintain a healthy weight and exercise regularly, as directed by your health care provider. Eat healthy foods, such as: Lean proteins, complex carbohydrates, fresh fruits and vegetables, low-fat dairy products, healthy fats.  Plan  Continue current medications.  Diabetes   A1c goal <6.5%  Lab Results  Component Value Date/Time   HGBA1C 6.9 (H) 06/09/2020 10:54 AM   HGBA1C 6.5 (A) 05/27/2020 09:25 AM   HGBA1C 6.1 (A) 05/29/2019  10:10 AM   HGBA1C 6.0 (A) 11/28/2018 08:12 AM   HGBA1C 6.1 (A) 08/29/2018 08:50 AM   HGBA1C 6.2 (A) 05/23/2018 09:06 AM   HGBA1C 6.7 (H) 01/26/2018 03:55 PM   HGBA1C 5.9 (H) 10/21/2011 02:56 PM   HGBA1C 6.0 (H) 10/10/2011 06:32 PM   HGBA1C 6.3 03/31/2010 08:08 AM  MICROALBUR <0.7 05/23/2018 09:12 AM   MICROALBUR 0.50 10/21/2011 02:56 PM   MICRALBCREAT 2.0 05/23/2018 09:12 AM   MICRALBCREAT 4.0 10/21/2011 02:56 PM   GFR 83.16 07/05/2014 11:43 AM   GFR 83.78 12/30/2011 09:45 AM   GFR 83.85 09/19/2011 11:32 AM   Lab Results  Component Value Date   EAG 151 06/09/2020   Previous medications: none noted.  Recent FBG readings: n/a. Would consider starting on metformin if a1c remains elevated, would want to start on lowest dose possible.   GFR 79 05/2020, a1c 6.9 06/09/2020. Patient is currently not at goal on: . N/a - no medications at this time  We discussed previous labs - reviewed potential future need to start on tx and counseled on metformin as an option  Plan  Continue current management, consider starting on metformin 500 mg once daily.  Hyperlipidemia   LDL goal < 70  Lipid Panel     Component Value Date/Time   CHOL 135 06/09/2020 1054   CHOL 156 03/14/2018 0918   CHOL 149 04/28/2017 0813   CHOL 197 01/18/2017 0932   CHOL 175 01/18/2016 0948   TRIG 118 06/09/2020 1054   TRIG 103 03/14/2018 0918   TRIG 109 04/28/2017 0813   TRIG 72 10/18/2006 1119   HDL 55 06/09/2020 1054   HDL 61 03/14/2018 0918   HDL 68 04/28/2017 0813   HDL 76 01/18/2017 0932   HDL 65 01/18/2016 0948   LDLCALC 59 06/09/2020 1054   LDLCALC 74 03/14/2018 0918   LDLCALC 59 04/28/2017 0813   LDLCALC 96 01/18/2017 0932   LDLCALC 85 01/18/2016 0948   LDLDIRECT 137.0 03/31/2010 0808   LDLDIRECT 183.0 04/11/2008 0817    Hepatic Function Latest Ref Rng & Units 06/09/2020 03/14/2018 04/28/2017  Total Protein 6.0 - 8.5 g/dL 6.6 7.1 6.4  Albumin 3.7 - 4.7 g/dL 4.6 4.7 4.2  AST 0 - 40 IU/L _0 ALT 0 - 44 IU/L _1 Alk Phosphatase 48 - 121 IU/L 63 65 46  Total Bilirubin 0.0 - 1.2 mg/dL 0.4 0.5 0.5  Bilirubin, Direct 0.00 - 0.40 mg/dL 0.14 0.15 0.1  Follows with Dr. Martinique. Previous medications: Rosuvastatin 20 mg (now 40 mg), atorvastatin 40 & 80 mg.  Denies issues with side effects. Patient is currently at goal the following medications:  . Rosuvastatin 40 mg once daily . Zetia 10 mg once daily  CAD, DM. On asa. Denies any abnormal bruising, bleeding from nose or gums or blood in urine or stool. Patient is currently controlled on the following medications:  . Aspirin 81 mg once daily   Medication side effect review - no problems noted.  Plan  Continue current medications.  GERD   No results found for: VITAMINB12 Previous medications: Nexium.  Long term PPI use, Hx of stomach ulcer, unsure of what may have provoked it.   Denies recent acid reflux. Currently controlled on: . Pantoprazole 40 mg once daily   We discussed: Avoidance of potential triggers such as alcohol, fatty foods, lying down after eating, and tomato sauce.  Plan   Continue current medications. Consider B12 lab due to long term high risk medication.  Vaccines   Immunization History  Administered Date(s) Administered  . Influenza Whole 11/14/2005  . Influenza, High Dose Seasonal PF 09/18/2017, 09/12/2018  . Influenza-Unspecified 07/15/2013, 10/14/2016  . Moderna Sars-Covid-2 Vaccination 11/29/2019, 12/27/2019  . Pneumococcal Polysaccharide-23 03/03/2009  . Td 10/14/2006  . Tdap 11/30/2017  . Zoster 10/28/2009  .  Zoster Recombinat (Shingrix) 02/12/2018, 04/14/2018   Will bring COVID vaccine card to upcoming. States that he received flu shot at [place] about 1 month ago.  Reviewed and discussed patient's vaccination history.    Plan  No recommendations at this time.  Migraines   Patient is currently controlled on the following medications:  . Tramadol 50 mg every six hours as needed  for pain . Tylenol PRN  Plan  Continue current medications.   Medication Management / Care Coordination   Receives prescription medications from: [facility].  Denies any issues with current medication management.   Plan  Continue current medication management strategy. ___________________________ SDOH (Social Determinants of Health) assessments performed: Yes. Future Appointments  Date Time Provider [place]  12/10/2020  9:20 AM Vivi Barrack, MD [facility]  07/07/2021  1:30 PM [facility] CCM PHARMACIST [facility]   Visit follow-up:  . CPA follow-up: n/a. Marland Kitchen [place] follow-up: 6-8 month telephone f/u visit. DM, HTN, HLD.  Madelin Rear, Pharm.D., BCGP Clinical Pharmacist [facility] [phone]

## 2020-12-07 NOTE — Addendum Note (Signed)
Addended by: Clyde Lundborg A on: 12/07/2020 10:39 AM   Modules accepted: Orders

## 2020-12-07 NOTE — Progress Notes (Signed)
Rx refilled.

## 2020-12-10 ENCOUNTER — Other Ambulatory Visit: Payer: Self-pay

## 2020-12-10 ENCOUNTER — Ambulatory Visit (INDEPENDENT_AMBULATORY_CARE_PROVIDER_SITE_OTHER): Payer: Medicare HMO | Admitting: Family Medicine

## 2020-12-10 ENCOUNTER — Encounter: Payer: Self-pay | Admitting: Family Medicine

## 2020-12-10 VITALS — BP 131/66 | HR 60 | Temp 96.7°F | Ht 73.0 in | Wt 209.2 lb

## 2020-12-10 DIAGNOSIS — E1169 Type 2 diabetes mellitus with other specified complication: Secondary | ICD-10-CM

## 2020-12-10 DIAGNOSIS — E1165 Type 2 diabetes mellitus with hyperglycemia: Secondary | ICD-10-CM

## 2020-12-10 DIAGNOSIS — I152 Hypertension secondary to endocrine disorders: Secondary | ICD-10-CM

## 2020-12-10 DIAGNOSIS — E1159 Type 2 diabetes mellitus with other circulatory complications: Secondary | ICD-10-CM

## 2020-12-10 DIAGNOSIS — E785 Hyperlipidemia, unspecified: Secondary | ICD-10-CM | POA: Diagnosis not present

## 2020-12-10 LAB — POCT GLYCOSYLATED HEMOGLOBIN (HGB A1C): Hemoglobin A1C: 6.8 % — AB (ref 4.0–5.6)

## 2020-12-10 NOTE — Progress Notes (Signed)
   Robert [name] is a 75 y.o. male who presents today for an office visit.  Assessment/Plan:  Chronic Problems Addressed Today: Hyperlipidemia associated with type 2 diabetes mellitus ([name]) Check lipids next blood draw.  Continue Crestor 40 mg daily and Zetia 5 mg daily.  Hypertension associated with diabetes ([place]) At goal.  Continue metoprolol 12.5 mg daily.  Type 2 diabetes mellitus with hyperglycemia ([name]) A1c 6.8 off medications.Recheck 6 months.    Subjective:  HPI:  See A/p.         Objective:  Physical Exam: BP 131/66   Pulse 60   Temp (!) 96.7 F (35.9 C)   Ht 6\' 1"  (1.854 m)   Wt 209 lb 3.2 oz (94.9 kg)   SpO2 97%   BMI 27.60 kg/m   Gen: No acute distress, resting comfortably CV: Regular rate and rhythm with no murmurs appreciated Pulm: Normal work of breathing, clear to auscultation bilaterally with no crackles, wheezes, or rhonchi Neuro: Grossly normal, moves all extremities Psych: Normal affect and thought content      Robert [name] M. Jerline Pain, MD 12/10/2020 9:52 AM

## 2020-12-10 NOTE — Assessment & Plan Note (Signed)
At goal.  Continue metoprolol 12.5 mg daily.

## 2020-12-10 NOTE — Assessment & Plan Note (Signed)
Check lipids next blood draw.  Continue Crestor 40 mg daily and Zetia 5 mg daily.

## 2020-12-10 NOTE — Patient Instructions (Signed)
It was very nice to see you today!  Your Numbers look great today.  Keep up the good work.  No changes today.  I will see you back in 6 months.  Please come back to see me sooner if needed.  Take care, Dr Jerline Pain  Please try these tips to maintain a healthy lifestyle:   Eat at least 3 REAL meals and 1-2 snacks per day.  Aim for no more than 5 hours between eating.  If you eat breakfast, please do so within one hour of getting up.    Each meal should contain half fruits/vegetables, one quarter protein, and one quarter carbs (no bigger than a computer mouse)   Cut down on sweet beverages. This includes juice, soda, and sweet tea.     Drink at least 1 glass of water with each meal and aim for at least 8 glasses per day   Exercise at least 150 minutes every week.

## 2020-12-10 NOTE — Assessment & Plan Note (Addendum)
A1c 6.8 off medications.Recheck 6 months.

## 2020-12-29 DIAGNOSIS — R350 Frequency of micturition: Secondary | ICD-10-CM | POA: Diagnosis not present

## 2020-12-29 DIAGNOSIS — N410 Acute prostatitis: Secondary | ICD-10-CM | POA: Diagnosis not present

## 2021-02-02 NOTE — Progress Notes (Signed)
I, Robert [name], LAT, ATC, am serving as scribe for Dr. Lynne Leader.  Robert [name] is a 75 y.o. male who presents to [place] at [facility] today for bilat hip and buttock pain, L>R.  He was last seen by Dr. Tamala Julian on 07/13/17 for buttock pain / B ischial bursitis and had B ischial tuberosity injections on 05/24/17.  Of note, prior L-spine XRs showed severe lumbar DDD.  Since then, pt reports he drove to [facility] this past weekend and was in excruciating pain in his L hip and buttock. Pt notes the hip pain has been worsening over the last 5-6 months. Pt locates pain to L lateral aspect of his hip and L buttocks. No numbness/tingling noted  Diagnostic testing: L-spine and T-spine CT- 04/18/18; L-spine XR- 03/01/17  Pertinent review of systems: No fevers or chills  Relevant historical information: Hypertension and diabetes   Exam:  BP 110/78 (BP Location: Right Arm, Patient Position: Sitting, Cuff Size: Normal)   Pulse 67   Ht 6\' 1"  (1.854 m)   Wt 217 lb 9.6 oz (98.7 kg)   SpO2 96%   BMI 28.71 kg/m  General: Well Developed, well nourished, and in no acute distress.   MSK: Left hip normal-appearing Decreased range of motion to flexion and internal rotation. Tender palpation lateral hip at the greater trochanter and posterior to the greater trochanter. Hip strength reduced to hip abduction 4/5 in external rotation 4/5 with some pain. Normal gait.    Lab and Radiology Results  X-ray images bilateral hip obtained today personally and independently interpreted Mild bilateral DJD.  No fracture or malalignment. Await formal radiology review  Procedure: Real-time Ultrasound Guided Injection of greater trochanter bursa posterior aspect Device: Philips Affiniti 50G Images permanently stored and available for review in PACS Verbal informed consent obtained.  Discussed risks and benefits of procedure. Warned about infection bleeding damage to structures skin hypopigmentation and  fat atrophy among others. Patient expresses understanding and agreement Time-out conducted.   Noted no overlying erythema, induration, or other signs of local infection.   Skin prepped in a sterile fashion.   Local anesthesia: Topical Ethyl chloride.   With sterile technique and under real time ultrasound guidance:  40 mg of Kenalog and 2 mL of Marcaine injected into greater trochanter bursa/tendon insertion posterior aspect of greater trochanter. Fluid seen entering the bursa.   Completed without difficulty   Pain immediately resolved suggesting accurate placement of the medication.   Advised to call if fevers/chills, erythema, induration, drainage, or persistent bleeding.   Images permanently stored and available for review in the ultrasound unit.  Impression: Technically successful ultrasound guided injection.     Assessment and Plan: 75 y.o. 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  Plan for injection today along with home exercise program as taught in clinic today by ATC.  Check back in 1 month.  If not improving or if hip abduction and external rotation strength still diminished as would consider formal physical therapy.    PDMP not reviewed this encounter. Orders Placed This Encounter  Procedures  . Robert [name] LIMITED JOINT SPACE STRUCTURES LOW BILAT(NO LINKED CHARGES)    Standing Status:   Future    Number of Occurrences:   1    Standing Expiration Date:   08/06/2021    Order Specific Question:   Reason for Exam (SYMPTOM  OR DIAGNOSIS REQUIRED)    Answer:   chronic hip pain  Order Specific Question:   Preferred imaging location?    Answer:   Robert [name]  . DG HIP UNILAT W OR W/O PELVIS 2-3 VIEWS LEFT    Standing Status:   Future    Standing Expiration Date:   02/03/2022    Order Specific Question:   Reason for Exam (SYMPTOM  OR DIAGNOSIS REQUIRED)    Answer:   chronic hip pain    Order Specific  Question:   Preferred imaging location?    Answer:   Robert [name]   No orders of the defined types were placed in this encounter.    Discussed warning signs or symptoms. Please see discharge instructions. Patient expresses understanding.   The above documentation has been reviewed and is accurate and complete Lynne Leader, M.D.

## 2021-02-03 ENCOUNTER — Other Ambulatory Visit: Payer: Self-pay | Admitting: Family Medicine

## 2021-02-03 ENCOUNTER — Ambulatory Visit: Payer: Medicare HMO | Admitting: Family Medicine

## 2021-02-03 ENCOUNTER — Ambulatory Visit: Payer: Self-pay

## 2021-02-03 ENCOUNTER — Other Ambulatory Visit: Payer: Self-pay

## 2021-02-03 ENCOUNTER — Ambulatory Visit (INDEPENDENT_AMBULATORY_CARE_PROVIDER_SITE_OTHER): Payer: Medicare HMO

## 2021-02-03 VITALS — BP 110/78 | HR 67 | Ht 73.0 in | Wt 217.6 lb

## 2021-02-03 DIAGNOSIS — M25551 Pain in right hip: Secondary | ICD-10-CM

## 2021-02-03 DIAGNOSIS — G8929 Other chronic pain: Secondary | ICD-10-CM

## 2021-02-03 DIAGNOSIS — M25552 Pain in left hip: Secondary | ICD-10-CM

## 2021-02-03 NOTE — Patient Instructions (Addendum)
Thank you for coming in today!  Please complete the exercises that the athletic trainer went over with you: View at [email or web address] using code: [email or web address]  You received a steroid injection today. Seek immediate medical attention if the joint becomes red, extremely painful, or is oozing fluid.  Please get an Xray today before you leave today.  See how it feels while numbed up.   Work on those exercises.   Recheck in about 1 month.   Consider physical therapy if not improving.

## 2021-02-05 NOTE — Progress Notes (Signed)
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  This is not certain based on x-ray.  This probably is not causing the hip pain that you are experiencing.  However it could be evaluated more thoroughly with MRI which also would evaluate more thoroughly the diagnoses that I think you have which is bursitis.  We will check back in 1 month and if still concerned we will proceed to MRI.

## 2021-02-25 ENCOUNTER — Encounter: Payer: Self-pay | Admitting: Family Medicine

## 2021-02-25 ENCOUNTER — Ambulatory Visit (INDEPENDENT_AMBULATORY_CARE_PROVIDER_SITE_OTHER): Payer: Medicare HMO | Admitting: Family Medicine

## 2021-02-25 ENCOUNTER — Other Ambulatory Visit: Payer: Self-pay

## 2021-02-25 VITALS — BP 137/65 | HR 67 | Temp 97.8°F | Ht 73.0 in | Wt 214.4 lb

## 2021-02-25 DIAGNOSIS — L0291 Cutaneous abscess, unspecified: Secondary | ICD-10-CM | POA: Diagnosis not present

## 2021-02-25 NOTE — Patient Instructions (Addendum)
It was very nice to see you today!  We drained the abscess on your back.  Please keep the area covered for the next few days.  Please remove the packing in 3 days.  Let us know if your symptoms are not improving.  Take care, Dr Jerline Pain  PLEASE NOTE:  If you had any lab tests please let us know if you have not heard back within a few days. You may see your results on mychart before we have a chance to review them but we will give you a call once they are reviewed by [place]. If we ordered any referrals today, please let us know if you have not heard from their office within the next week.   Please try these tips to maintain a healthy lifestyle:   Eat at least 3 REAL meals and 1-2 snacks per day.  Aim for no more than 5 hours between eating.  If you eat breakfast, please do so within one hour of getting up.    Each meal should contain half fruits/vegetables, one quarter protein, and one quarter carbs (no bigger than a computer mouse)   Cut down on sweet beverages. This includes juice, soda, and sweet tea.     Drink at least 1 glass of water with each meal and aim for at least 8 glasses per day   Should be coming in.  Exercise at least 150 minutes every week.

## 2021-02-25 NOTE — Progress Notes (Signed)
   Robert [name] is a 75 y.o. male who presents today for an office visit.  Assessment/Plan:  New/Acute Problems: Abscess No red flags. I&D performed below.  See procedure note.  He tolerated well.  Advised patient to keep area clean and dry for the next 2 days.  Advised him to remove iodoform gauze in 2 to 3 days.  Discussed warning signs of infection and reasons to return to care. He can use OTC analgesics as needed.     Subjective:  HPI:  Patient with inflamed cyst on right lower back.  Start about a week ago.  Has become more swollen, red, and painful.  No reported fevers or chills.        Objective:  Physical Exam: BP 137/65   Pulse 67   Temp 97.8 F (36.6 C) (Temporal)   Ht 6\' 1"  (1.854 m)   Wt 214 lb 6.4 oz (97.3 kg)   SpO2 97%   BMI 28.29 kg/m   Gen: No acute distress, resting comfortably CV: Regular rate and rhythm with no murmurs appreciated Pulm: Normal work of breathing, clear to auscultation bilaterally with no crackles, wheezes, or rhonchi Skin: Approximately 3 cm inflamed indurated area on right lower back. Neuro: Grossly normal, moves all extremities Psych: Normal affect and thought content  Incision and Drainage Procedure Note  Pre-operative Diagnosis: Abscess  Post-operative Diagnosis: same  Indications: Therapeutic  Anesthesia: 1% lidocaine with epinephrine  Procedure Details  The procedure, risks and complications have been discussed in detail (including, but not limited to airway compromise, infection, bleeding) with the patient, and the patient has given verbal consent to the procedure.  The skin was sterilely prepped and draped over the affected area in the usual fashion. After adequate local anesthesia, I&D with a #11 blade was performed on the left lower back. Purulent drainage: present. The abscess was packed with iodoform gauze.The patient was observed until stable.  Findings: Abscess  EBL: 2 cc's  Drains: N/A  Condition: Tolerated  procedure well   Complications: none.        Robert [name]. Jerline Pain, MD 02/25/2021 8:55 AM

## 2021-03-01 ENCOUNTER — Other Ambulatory Visit: Payer: Self-pay

## 2021-03-01 ENCOUNTER — Telehealth: Payer: Self-pay

## 2021-03-01 MED ORDER — DOXYCYCLINE HYCLATE 100 MG PO TABS: 100.0000 mg | ORAL_TABLET | Freq: Two times a day (BID) | ORAL | 0 refills | Status: AC

## 2021-03-01 NOTE — Telephone Encounter (Signed)
Ok to send in doxycycline 100mg  bid x 7 days. Recommend he come back in for office visit if still red, painful, or as oozing pus.  Algis Greenhouse. Jerline Pain, MD 03/01/2021 10:56 AM

## 2021-03-01 NOTE — Telephone Encounter (Signed)
Robert [name] is calling in stating that her husband received a message about a medication that was going to be sent in for the cyst. Wife is concerned about it as it has popped and states there was about 2 tablespoons full of puss and blood that came out, now there is a hole there with some redness around the wound. Wondering if the medication will be able to treat it and any recommendations on keeping it clean.

## 2021-03-01 NOTE — Telephone Encounter (Signed)
Rx sent in patient notified 

## 2021-03-01 NOTE — Telephone Encounter (Signed)
Patient scheduled with Dr.Parker for concerns

## 2021-03-01 NOTE — Telephone Encounter (Signed)
Patient called in and said he removed the gauze and it is still very red and still has puss coming out. Patient wants to know if he needs a antibiotic or if he needs to come back in the office. Please advise.

## 2021-03-02 ENCOUNTER — Other Ambulatory Visit: Payer: Self-pay

## 2021-03-02 ENCOUNTER — Ambulatory Visit (INDEPENDENT_AMBULATORY_CARE_PROVIDER_SITE_OTHER): Payer: Medicare HMO | Admitting: Family Medicine

## 2021-03-02 ENCOUNTER — Encounter: Payer: Self-pay | Admitting: Family Medicine

## 2021-03-02 VITALS — BP 156/71 | HR 64 | Temp 97.0°F | Ht 73.0 in | Wt 216.4 lb

## 2021-03-02 DIAGNOSIS — L0390 Cellulitis, unspecified: Secondary | ICD-10-CM

## 2021-03-02 DIAGNOSIS — I152 Hypertension secondary to endocrine disorders: Secondary | ICD-10-CM

## 2021-03-02 DIAGNOSIS — E1159 Type 2 diabetes mellitus with other circulatory complications: Secondary | ICD-10-CM

## 2021-03-02 NOTE — Assessment & Plan Note (Signed)
Above goal though in pain today secondary to cellulitis/abscess.  Has been well controlled in the past.  Continue metoprolol tartrate 12.5 mg daily.

## 2021-03-02 NOTE — Patient Instructions (Signed)
It was very nice to see you today!  Please finish the antibiotic.  You will probably have drainage for the next week or 2.  This is okay.  Keep the area clean and dry and bandaged.  Let us know if you have worsening pain or redness to the area.  Take care, Dr Jerline Pain  PLEASE NOTE:  If you had any lab tests please let us know if you have not heard back within a few days. You may see your results on mychart before we have a chance to review them but we will give you a call once they are reviewed by [place]. If we ordered any referrals today, please let us know if you have not heard from their office within the next week.   Please try these tips to maintain a healthy lifestyle:   Eat at least 3 REAL meals and 1-2 snacks per day.  Aim for no more than 5 hours between eating.  If you eat breakfast, please do so within one hour of getting up.    Each meal should contain half fruits/vegetables, one quarter protein, and one quarter carbs (no bigger than a computer mouse)   Cut down on sweet beverages. This includes juice, soda, and sweet tea.     Drink at least 1 glass of water with each meal and aim for at least 8 glasses per day   Exercise at least 150 minutes every week.

## 2021-03-02 NOTE — Progress Notes (Signed)
   Robert Robert [name] is a 75 y.o. male who presents today for an office visit.  Assessment/Plan:  New/Acute Problems: Cellulitis No signs of systemic infection.  He was started on doxycycline yesterday.  We will continue this for a full 7-day course.  Discussed with patient that he will likely have drainage from his I&D site for the next 1 to 2 weeks.  Discussed warning signs for worsening infection including worsening redness, Robert [name], or foul-smelling discharge.  Discussed reasons to return to care.  Chronic Problems Addressed Today: Hypertension associated with diabetes ([place]) Above goal though in Robert [name] today secondary to cellulitis/abscess.  Has been well controlled in the past.  Continue metoprolol tartrate 12.5 mg daily.     Subjective:  HPI:  Patient here with concern for skin infection.  Had abscess last week that was I indeed.  Has had continued drainage to the area.  Had some concern for cellulitis.  Doxycycline was started yesterday.  Robert [name] is a little bit better compared to last week.  No erythema.       Objective:  Physical Exam: BP (!) 156/71   Pulse 64   Temp (!) 97 F (36.1 C)   Ht 6\' 1"  (1.854 m)   Wt 216 lb 6.1 oz (98.1 kg)   SpO2 95%   BMI 28.55 kg/m   Gen: No acute distress, resting comfortably Skin: Approximately 4 cm erythematous area on back in same area as previous abscess.  Bloody discharge mixed with sebaceous material noted from site of I&D.  Mildly tender to palpation. Neuro: Grossly normal, moves all extremities Psych: Normal affect and thought content      Robert Berrett M. Jerline Pain, MD 03/02/2021 1:23 PM

## 2021-03-03 ENCOUNTER — Ambulatory Visit: Payer: Medicare HMO | Admitting: Family Medicine

## 2021-03-09 NOTE — Progress Notes (Signed)
Robert [name] is a 75 y.o. male who presents to [place] at [facility] today for f/u of B hip and buttock pain, L>R.  He was last seen by Dr. Georgina Snell on 02/03/21 and had a L GT injection.  He was also shown a HEP focusing on glute strengthening.  Of note, prior L-spine XRs showed severe lumbar DDD.  Since his last visit, pt reports that the L GT injection helped for a few days but then pain returned to baseline.  He states that driving and sitting is the most problematic.  He notes that he will driving to [facility] later today.  He denies any radiating pain into his thighs.  Diagnostic testing: B hip XR- 02/03/21  Pertinent review of systems: No fevers or chills  Relevant historical information: Hypertension and diabetes   Exam:  BP 124/62 (BP Location: Right Arm, Patient Position: Sitting, Cuff Size: Normal)   Pulse 62   Ht 6\' 1"  (1.854 m)   Wt 217 lb (98.4 kg)   SpO2 96%   BMI 28.63 kg/m  General: Well Developed, well nourished, and in no acute distress.   MSK: Left hip normal-appearing tender palpation greater trochanter. Hip abduction and external rotation strength diminished 4/5 with some pain.    Lab and Radiology Results   EXAM: DG HIP (WITH OR WITHOUT PELVIS) 5+V BILAT  COMPARISON:  None.  FINDINGS: Mixed sclerotic and lucent changes in the femoral head bilaterally suggesting avascular necrosis. No collapse or fracture. Mild hip joint space narrowing bilaterally. Remainder of the hip is normal.  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 Negative for fracture   Electronically Signed   By: Franchot Gallo M.D.   On: 02/04/2021 09:27 I, Lynne Leader, personally (independently) visualized and performed the interpretation of the images attached in this note.     Assessment and Plan: 75 y.o. 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  X-ray showed  avascular necrosis bilaterally as well which I am not sure is contributory to pain.  However avascular necrosis change would limit use of steroids.  Discussed options.  Plan for MRI to further characterize cause of pain.  Additionally discussed offloading pressure while driving.  Additionally refer to physical therapy which should help with strengthening. Recheck following MRI results.   PDMP not reviewed this encounter. Orders Placed This Encounter  Procedures  . MR HIP LEFT WO CONTRAST    Standing Status:   Future    Standing Expiration Date:   03/10/2022    Order Specific Question:   What is the patient's sedation requirement?    Answer:   No Sedation    Order Specific Question:   Does the patient have a pacemaker or implanted devices?    Answer:   No    Order Specific Question:   Preferred imaging location?    Answer:   Product/process development scientist (table limit-350lbs)  . Ambulatory referral to Physical Therapy    Referral Priority:   Routine    Referral Type:   Physical Medicine    Referral Reason:   Specialty Services Required    Requested Specialty:   Physical Therapy   No orders of the defined types were placed in this encounter.    Discussed warning signs or symptoms. Please see discharge instructions. Patient expresses understanding.   The above documentation has been reviewed and is accurate and complete Lynne Leader,  M.D.

## 2021-03-10 ENCOUNTER — Encounter: Payer: Self-pay | Admitting: Family Medicine

## 2021-03-10 ENCOUNTER — Other Ambulatory Visit: Payer: Self-pay

## 2021-03-10 ENCOUNTER — Ambulatory Visit: Payer: Medicare HMO | Admitting: Family Medicine

## 2021-03-10 VITALS — BP 124/62 | HR 62 | Ht 73.0 in | Wt 217.0 lb

## 2021-03-10 DIAGNOSIS — L308 Other specified dermatitis: Secondary | ICD-10-CM | POA: Diagnosis not present

## 2021-03-10 DIAGNOSIS — M7062 Trochanteric bursitis, left hip: Secondary | ICD-10-CM

## 2021-03-10 DIAGNOSIS — M25552 Pain in left hip: Secondary | ICD-10-CM

## 2021-03-10 DIAGNOSIS — M8700 Idiopathic aseptic necrosis of unspecified bone: Secondary | ICD-10-CM | POA: Insufficient documentation

## 2021-03-10 DIAGNOSIS — L720 Epidermal cyst: Secondary | ICD-10-CM | POA: Diagnosis not present

## 2021-03-10 NOTE — Patient Instructions (Addendum)
Thank you for coming in today.  You should hear from MRI scheduling within 1 week. If you do not hear please let me know.   I've referred you to Physical Therapy.  Let us know if you don't hear from them in one week.  Recheck after the MRI.   Try to take the pressure off the hip with that drive.

## 2021-03-15 ENCOUNTER — Other Ambulatory Visit: Payer: Self-pay

## 2021-03-15 ENCOUNTER — Ambulatory Visit (INDEPENDENT_AMBULATORY_CARE_PROVIDER_SITE_OTHER): Payer: Medicare HMO

## 2021-03-15 DIAGNOSIS — M7062 Trochanteric bursitis, left hip: Secondary | ICD-10-CM

## 2021-03-15 DIAGNOSIS — M8700 Idiopathic aseptic necrosis of unspecified bone: Secondary | ICD-10-CM

## 2021-03-15 DIAGNOSIS — M1612 Unilateral primary osteoarthritis, left hip: Secondary | ICD-10-CM | POA: Diagnosis not present

## 2021-03-15 DIAGNOSIS — G8929 Other chronic pain: Secondary | ICD-10-CM | POA: Diagnosis not present

## 2021-03-15 DIAGNOSIS — M8568 Other cyst of bone, other site: Secondary | ICD-10-CM | POA: Diagnosis not present

## 2021-03-15 DIAGNOSIS — M25552 Pain in left hip: Secondary | ICD-10-CM | POA: Diagnosis not present

## 2021-03-15 DIAGNOSIS — S73192A Other sprain of left hip, initial encounter: Secondary | ICD-10-CM | POA: Diagnosis not present

## 2021-03-15 DIAGNOSIS — M87852 Other osteonecrosis, left femur: Secondary | ICD-10-CM | POA: Diagnosis not present

## 2021-03-15 NOTE — Progress Notes (Signed)
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  With arthritis and degeneration and tearing of the labrum.  The tendons look okay without evidence of significant bursitis.  I think this may be the source of your pain.  Please return to clinic to discuss treatment plan and options in full detail.  We may try an injection into the joint although that generally is a somewhat dubious idea.  We will talk about it in more detail

## 2021-03-15 NOTE — Progress Notes (Signed)
Rito Ehrlich, am serving as a Education administrator for Dr. Lynne Leader.  Robert [name] is a 75 y.o. male who presents to [place] at [facility] today for f/u of B hip pain and L hip MRI review.  He was last seen by Dr. Georgina Snell on 03/10/21 and noted short term relief from his prior corticosteroid injection on 02/03/21 w/ pain being essentially the same as before.  He was referred to PT and educated about AVN in his B hips.  Since his last visit, pt reports that he feels the same pain wise. Patient is wanting to discuss next steps/where to go from here.   Diagnostic imaing: L hip MRI- 03/15/21; B hip XR- 02/03/21  Pertinent review of systems: No fevers or chills  Relevant historical information: Diabetes   Exam:  BP 110/60 (BP Location: Left Arm, Patient Position: Sitting, Cuff Size: Normal)   Pulse 66   Ht 6\' 1"  (1.854 m)   Wt 216 lb (98 kg)   SpO2 94%   BMI 28.50 kg/m  General: Well Developed, well nourished, and in no acute distress.   MSK: Left hip normal-appearing normal motion.      Lab and Radiology Results No results found for this or any previous visit (from the past 72 hour(s)). MR HIP LEFT WO CONTRAST  Result Date: 03/15/2021 CLINICAL DATA:  Chronic left hip pain.  No known injury. EXAM: MR OF THE LEFT HIP WITHOUT CONTRAST TECHNIQUE: Multiplanar, multisequence MR imaging was performed. No intravenous contrast was administered. COMPARISON:  None. FINDINGS: Bones: The patient has avascular necrosis of the femoral heads bilaterally which is more extensive on the left. No fragmentation, collapse or marrow edema to suggest secondary stress change is present on the right or left. Small subchondral cysts are seen in the periphery of the left acetabulum. There is minimal marrow edema in periphery the right acetabulum. No fracture or worrisome lesion is identified. Articular cartilage and labrum Articular cartilage: Degenerated and thinned on the left throughout. Labrum:  Extensive degenerative tearing of the left anterior and superior labrum is seen. Joint or bursal effusion Joint effusion:  None. Bursae: Negative. Muscles and tendons Muscles and tendons:  Intact. Other findings Miscellaneous:   Imaged intrapelvic contents are negative. IMPRESSION: Avascular necrosis of the femoral heads is more extensive on the left. There is no fragmentation or collapse of the femoral heads and no secondary stress change. 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 Electronically Signed   By: Inge Rise M.D.   On: 03/15/2021 11:48   I, Lynne Leader, personally (independently) visualized and performed the interpretation of the images attached in this note.   Procedure: Real-time Ultrasound Guided Injection of left hip femoral acetabular joint Device: Philips Affiniti 50G Images permanently stored and available for review in PACS Verbal informed consent obtained.  Discussed risks and benefits of procedure. Warned about infection bleeding damage to structures skin hypopigmentation and fat atrophy among others. Patient expresses understanding and agreement Time-out conducted.   Noted no overlying erythema, induration, or other signs of local infection.   Skin prepped in a sterile fashion.   Local anesthesia: Topical Ethyl chloride.   With sterile technique and under real time ultrasound guidance:  5 mL of Marcaine injected into joint capsule. Fluid seen entering the joint.   Completed without difficulty   Pain moderately resolved suggesting accurate placement of the medication.   Advised to call if fevers/chills, erythema, induration, drainage, or  persistent bleeding.   Images permanently stored and available for review in the ultrasound unit.  Impression: Technically successful ultrasound guided injection.       Assessment and Plan: 75 y.o. male with left hip and buttocks pain.  Patient has primarily  posterior hip pain with standing and especially with prolonged sitting.  This pain pattern is much more typical for hip abductor tendinopathy and bursitis.  He was failing conservative management and x-ray was concerning for AVN.  MRI surprisingly did not show much hip abductor tendinopathy or bursitis but did show left worse than right bilateral hip avascular necrosis.  After discussion plan for trial of diagnostic injection today in clinic.  Patient had intra-articular left hip injection and did have some immediate pain relief in clinic.  He will go home and drive around and see if he can reproduce the pain that he had been having while his hip is numbed up.  If the injection into his hip helps his buttocks pain we can be fairly confident that hip replacement would solve his problems.  If it does not then we can anticipate that his pain is being generated more in his buttocks and that targeted injections or more dedicated treatment into his buttocks is more warranted despite MRI not showing much of a problem there.  Patient will let me know how he feels over the next few days after the injection wears off.  Of note would like to avoid steroids if possible to avoid worsening AVN especially in the contralateral right hip which is not bothering him.   PDMP not reviewed this encounter. Orders Placed This Encounter  Procedures  . [place] LIMITED JOINT SPACE STRUCTURES LOW LEFT(NO LINKED CHARGES)    Order Specific Question:   Reason for Exam (SYMPTOM  OR DIAGNOSIS REQUIRED)    Answer:   left hip inj    Order Specific Question:   Preferred imaging location?    Answer:   [facility]   No orders of the defined types were placed in this encounter.    Discussed warning signs or symptoms. Please see discharge instructions. Patient expresses understanding.   The above documentation has been reviewed and is accurate and complete Lynne Leader, M.D.

## 2021-03-16 ENCOUNTER — Ambulatory Visit: Payer: Self-pay

## 2021-03-16 ENCOUNTER — Encounter: Payer: Self-pay | Admitting: Family Medicine

## 2021-03-16 ENCOUNTER — Ambulatory Visit: Payer: Medicare HMO | Admitting: Family Medicine

## 2021-03-16 VITALS — BP 110/60 | HR 66 | Ht 73.0 in | Wt 216.0 lb

## 2021-03-16 DIAGNOSIS — M8700 Idiopathic aseptic necrosis of unspecified bone: Secondary | ICD-10-CM

## 2021-03-16 DIAGNOSIS — M7062 Trochanteric bursitis, left hip: Secondary | ICD-10-CM

## 2021-03-16 DIAGNOSIS — M25552 Pain in left hip: Secondary | ICD-10-CM

## 2021-03-16 NOTE — Patient Instructions (Signed)
Thank you for coming in today.  Call or go to the ER if you develop a large red swollen joint with extreme pain or oozing puss.   See how it feels over the next few hours while numb and then over the next day when not numb.   Drive around today.   Let me know. MyChart will work.

## 2021-03-17 DIAGNOSIS — L720 Epidermal cyst: Secondary | ICD-10-CM | POA: Diagnosis not present

## 2021-03-20 ENCOUNTER — Other Ambulatory Visit: Payer: Medicare HMO

## 2021-03-30 ENCOUNTER — Other Ambulatory Visit: Payer: Self-pay

## 2021-03-30 ENCOUNTER — Ambulatory Visit: Payer: Medicare HMO | Admitting: Physical Therapy

## 2021-03-30 ENCOUNTER — Encounter: Payer: Self-pay | Admitting: Physical Therapy

## 2021-03-30 DIAGNOSIS — M25652 Stiffness of left hip, not elsewhere classified: Secondary | ICD-10-CM

## 2021-03-30 DIAGNOSIS — M25551 Pain in right hip: Secondary | ICD-10-CM | POA: Diagnosis not present

## 2021-03-30 DIAGNOSIS — M25651 Stiffness of right hip, not elsewhere classified: Secondary | ICD-10-CM | POA: Diagnosis not present

## 2021-03-30 DIAGNOSIS — M25552 Pain in left hip: Secondary | ICD-10-CM

## 2021-03-30 NOTE — Therapy (Addendum)
[facility] [address] [place], [place], [address] Phone: [phone]   Fax:  [phone]  Physical Therapy Evaluation  Patient Details  Name: Robert [name] MRN: [id number] Date of Birth: 04/18/1946 Referring Provider (PT): Lynne Leader   Encounter Date: 03/30/2021   PT End of Session - 03/30/21 1034    Visit Number 1    Number of Visits 12    Date for PT Re-Evaluation 05/11/21    Authorization Type [employer]    PT Start Time [phone]    PT Stop Time 1014    PT Time Calculation (min) 43 min    Activity Tolerance Patient tolerated treatment well    Behavior During Therapy [facility] for tasks assessed/performed           Past Medical History:  Diagnosis Date  . CAD (coronary artery disease) 10/10/2011   NSTEMI with DES to the proximal RCA following flow wire evaluation. EF is normal.   . GERD (gastroesophageal reflux disease)   . H/O esophageal ulcer   . History of stomach ulcers   . Hyperlipidemia   . Hypertension   . Migraines    "quite often; maybe q 10d to 2 wk" (01/14/2014)  . Myocardial infarction ([place]) 10/2011  . OSA on CPAP    "wear it most of the time" (01/14/2014)  . 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 Squamous cell carcinoma 12/17/2016    Past Surgical History:  Procedure Laterality Date  . BACK SURGERY    . CATARACT EXTRACTION Bilateral 2019  . CATARACT EXTRACTION W/ INTRAOCULAR LENS IMPLANT Bilateral 01/2019  . CERVICAL DISC SURGERY  1980's?  . CORONARY ANGIOPLASTY WITH STENT PLACEMENT  10/13/2011   DES to the proximal RCA. Dr Martinique  . FRACTIONAL FLOW RESERVE WIRE Right 10/13/2011   Procedure: FRACTIONAL FLOW RESERVE WIRE;  Surgeon: Peter M Martinique, MD;  Location: [facility] CATH LAB;  Service: Cardiovascular;  Laterality: Right;  . HERNIA REPAIR    . INGUINAL HERNIA REPAIR  10/23/2012   Procedure: HERNIA REPAIR INGUINAL ADULT;  Surgeon: Odis Hollingshead, MD;  Location: [place];  Service: General;  Laterality:  Left;  Left lower quadrant; left inguinal hernia repair with mesh  . INSERTION OF MESH  10/23/2012   Procedure: INSERTION OF MESH;  Surgeon: Odis Hollingshead, MD;  Location: [place];  Service: General;  Laterality: Left;  Left lower quadrant  . LEFT HEART CATHETERIZATION WITH CORONARY ANGIOGRAM N/A 10/13/2011   Procedure: LEFT HEART CATHETERIZATION WITH CORONARY ANGIOGRAM;  Surgeon: Peter M Martinique, MD;  Location: [facility] CATH LAB;  Service: Cardiovascular;  Laterality: N/A;  . PERCUTANEOUS CORONARY STENT INTERVENTION (PCI-S) Right 10/13/2011   Procedure: PERCUTANEOUS CORONARY STENT INTERVENTION (PCI-S);  Surgeon: Peter M Martinique, MD;  Location: [facility] CATH LAB;  Service: Cardiovascular;  Laterality: Right;  . SHOULDER ARTHROSCOPY W/ ROTATOR CUFF REPAIR Left 1990's  . TRIGGER FINGER RELEASE Left    3rd and 4th digits    There were no vitals filed for this visit.    Subjective Assessment - 03/30/21 0937    Subjective Pt states ongoing pain for about 1 year. States pain in Bil hips, L >R. Is retired, does yard work and is active at home. Likes to play golf, 2x/wk.  Sitting creates shooting, sharp pain. States stiffness in L hip, much difficulty gettting socks on can't cross leg over. Has had injection that did not help. Has been referred to [place]  in about 4  weeks.    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 Yes    Pain Score 4     Pain Location Hip    Pain Orientation Left;Right    Pain Descriptors / Indicators Aching    Pain Type Chronic pain    Pain Radiating Towards up to 10/10 with sitting for more than 45 min., riding in car    Pain Onset More than a month ago    Pain Frequency Intermittent    Aggravating Factors  Sitting              [facility] PT Assessment - 03/30/21  0001      Assessment   Medical Diagnosis Bilateral hip pain    Referring Provider (PT) Lynne Leader    Prior Therapy not for hips      Precautions   Precautions None      Balance Screen   Has the patient fallen in the past 6 months No      Prior Function   Level of Independence Independent      Cognition   Overall Cognitive Status Within Functional Limits for tasks assessed      ROM / Strength   AROM / PROM / Strength AROM;Strength      AROM   Overall AROM Comments Bil hips: moderate limitation for flexion, rotation, mild limitation for extension;  Lumbar: significant limitations for all motions.      Strength   Overall Strength Comments Hips: 4+/5,      Palpation   Palpation comment Signficant tenderness at L glute, at ischial tuberosity, and into piriformis/ rotators; Hypomobile lumbar spine      Special Tests   Other special tests + Fadir on L.      Ambulation/Gait   Gait Comments Decreased hip extension, decreased trunk rotation bilaterally.                      Objective measurements completed on examination: See above findings.       [place] Adult PT Treatment/Exercise - 03/30/21 0001      Exercises   Exercises Lumbar      Lumbar Exercises: Stretches   Single Knee to Chest Stretch 3 reps;30 seconds    Pelvic Tilt 20 reps    Piriformis Stretch 2 reps;30 seconds    Piriformis Stretch Limitations modified fig 4, supine      Manual Therapy   Manual Therapy Soft tissue mobilization    Soft tissue mobilization TPR with tennis ball, to L piriformis                  PT Education - 03/30/21 1033    Education Details PT POC, Exam findings, HEP.    Person(s) Educated Patient    Methods Explanation;Demonstration;Tactile cues;Verbal cues;Handout    Comprehension Verbalized understanding;Returned demonstration;Verbal cues required;Tactile cues required;Need further instruction            PT Short Term Goals - 03/30/21 1044      PT SHORT  TERM GOAL #1   Title Pt to be independent with initial HEP    Time 2    Period Weeks    Status New    Target Date 04/13/21  PT Long Term Goals - 03/30/21 1047      PT LONG TERM GOAL #1   Title Pt to be independent with final HEP    Time 6    Period Weeks    Status New    Target Date 05/11/21      PT LONG TERM GOAL #2   Title Pt to report decreased pain in bil hips to 0-3/10 with sitting, riding in car, and with activity    Time 6    Period Weeks    Status New    Target Date 05/11/21      PT LONG TERM GOAL #3   Title Pt to demo hip and lumbar mobility to have only mild/moderate limitation for flexion, to imrpove ability for forward bending, ADLS, and  IADLs.    Time 6    Period Weeks    Status New    Target Date 05/11/21                  Plan - 03/30/21 1037    Clinical Impression Statement Pt presents with primary complaint of increased pain in bilateral hips, L>R. Symptoms consistent with OA and AVN. Pt with stiffness of bil hips as well as lumbar spine, with loss of flexion for lumbar spine, and decreased ROM for hips. Pt also with significant sorenss in L  ishial region and into glute musculature, reproduced with palpation and sitting. Pt to benefit from focus on this, to relieve pain with sitting, which may be more muscular vs referred pain from hip OA.  He has  decreased ability for functional mobility, bending, sitting, squatting, driving and IADLS,  due to pain and limtiations. Pt to benefit from skilled PT to improve deficits and pain.    Personal Factors and Comorbidities Comorbidity 1    Comorbidities AVN, OA,    Examination-Activity Limitations Bend;Sit;Carry;Squat;Stand;Locomotion Level    Examination-Participation Restrictions Cleaning;Community Activity;Driving;Yard Work    Stability/Clinical Decision Making Stable/Uncomplicated    Clinical Decision Making Low    Rehab Potential Good    PT Frequency 2x / week    PT Duration 6 weeks     PT Treatment/Interventions ADLs/Self Care Home Management;Cryotherapy;Electrical Stimulation;Iontophoresis 4mg /ml Dexamethasone;Moist Heat;Traction;Ultrasound;Gait training;DME Instruction;Stair training;Functional mobility training;Therapeutic activities;Therapeutic exercise;Balance training;Neuromuscular re-education;Manual techniques;Orthotic Fit/Training;Patient/family education;Passive range of motion;Dry needling;Taping;Vasopneumatic Device;Spinal Manipulations;Joint Manipulations    PT Home Exercise Plan [email or web address]    Consulted and Agree with Plan of Care Patient           Patient will benefit from skilled therapeutic intervention in order to improve the following deficits and impairments:  Abnormal gait,Decreased range of motion,Increased muscle spasms,Decreased activity tolerance,Pain,Hypomobility,Impaired flexibility,Improper body mechanics,Decreased mobility,Decreased strength  Visit Diagnosis: Stiffness of left hip, not elsewhere classified  Pain in left hip  Pain in right hip  Stiffness of right hip, not elsewhere classified     Problem List Patient Active Problem List   Diagnosis Date Noted  . AVN (avascular necrosis of bone) ([place]) 03/10/2021  . Type 2 diabetes mellitus with hyperglycemia ([place]) 05/23/2018  . Chronically dry eyes, bilateral 05/23/2018  . Migraines 11/28/2016  . Hypertension associated with diabetes ([place]) 10/11/2011  . History of basal cell carcinoma (BCC), right ear 03/03/2009  . Hyperlipidemia associated with type 2 diabetes mellitus ([place]) 01/11/2008  . Allergic rhinitis 01/07/2008  . Prostatitis, recurrent 01/07/2008  . History of gastric ulcer 01/07/2008  . History of colon polyps, followed by Dr. Earlean Shawl 01/07/2008  . GERD 04/02/2007  . Sleep apnea 04/02/2007  Lyndee Hensen, PT, DPT 12:06 PM  03/30/21   [facility] [place], [place], [address] Phone: [phone]   Fax:   [phone]  Name: Robert [name] MRN: [id number] Date of Birth: [birth year]    Referring diagnosis? R hip pain, and L hip pain   (chronic)  Treatment diagnosis? (if different than referring diagnosis)  What was this (referring dx) caused by? []  Surgery []  Fall []  Ongoing issue [x]  Arthritis []  Other: ____________  Laterality: []  Rt []  Lt [x]  Both   Time:  03/30/21  To 05/25/21  ( 8 weeks )   Check all possible CPT codes:      [x]  97110 (Therapeutic Exercise)  []  92507 (SLP Treatment)  [x]  [id number] (Neuro Re-ed)   []  92526 (Swallowing Treatment)   [x]  97116 (Gait Training)   []  [id number] (Cognitive Training, 1st 15 minutes) [x]  97140 (Manual Therapy)   []  97130 (Cognitive Training, each add'l 15 minutes)  [x]  97530 (Therapeutic Activities)  []  Other, List CPT Code ____________    [x]  [id number] (Self Care)       []  All codes above (97110 - 97535)  []  97012 (Mechanical Traction)  [x]  97014 (E-stim Unattended)  []  97032 (E-stim manual)  [x]  97033 (Ionto)  []  97035 (Ultrasound)  []  97760 (Orthotic Fit) []  32440 (Physical Performance Training) []  [id number] (Aquatic Therapy) []  97034 (Contrast Bath) []  [id number] (Paraffin) []  97597 (Wound Care 1st 20 sq cm) []  10272 (Wound Care each add'l 20 sq cm) []  97016 (Vasopneumatic Device) []  [id number] Comptroller) []  [id number] (Prosthetic Training)

## 2021-03-30 NOTE — Patient Instructions (Signed)
Access Code: [email or web address] URL: [email or web address] Date: 03/30/2021 Prepared by: Lyndee Hensen  Exercises Supine Single Knee to Chest Stretch - 2 x daily - 3 reps - 30 hold Supine Posterior Pelvic Tilt - 2 x daily - 1 sets - 10 reps Supine Piriformis Stretch Pulling Heel to Hip - 2 x daily - 3 reps - 30 hold

## 2021-04-06 ENCOUNTER — Encounter: Payer: Self-pay | Admitting: Physical Therapy

## 2021-04-06 ENCOUNTER — Ambulatory Visit: Payer: Medicare HMO | Admitting: Orthopaedic Surgery

## 2021-04-06 ENCOUNTER — Other Ambulatory Visit: Payer: Self-pay

## 2021-04-06 ENCOUNTER — Ambulatory Visit: Payer: Medicare HMO | Admitting: Physical Therapy

## 2021-04-06 ENCOUNTER — Encounter: Payer: Self-pay | Admitting: Orthopaedic Surgery

## 2021-04-06 DIAGNOSIS — M5442 Lumbago with sciatica, left side: Secondary | ICD-10-CM

## 2021-04-06 DIAGNOSIS — M1612 Unilateral primary osteoarthritis, left hip: Secondary | ICD-10-CM

## 2021-04-06 DIAGNOSIS — M25652 Stiffness of left hip, not elsewhere classified: Secondary | ICD-10-CM | POA: Diagnosis not present

## 2021-04-06 DIAGNOSIS — M25552 Pain in left hip: Secondary | ICD-10-CM

## 2021-04-06 DIAGNOSIS — M25551 Pain in right hip: Secondary | ICD-10-CM | POA: Diagnosis not present

## 2021-04-06 DIAGNOSIS — M4807 Spinal stenosis, lumbosacral region: Secondary | ICD-10-CM

## 2021-04-06 DIAGNOSIS — G8929 Other chronic pain: Secondary | ICD-10-CM

## 2021-04-06 DIAGNOSIS — M87052 Idiopathic aseptic necrosis of left femur: Secondary | ICD-10-CM | POA: Diagnosis not present

## 2021-04-06 DIAGNOSIS — M25651 Stiffness of right hip, not elsewhere classified: Secondary | ICD-10-CM | POA: Diagnosis not present

## 2021-04-06 NOTE — Progress Notes (Signed)
Office Visit Note   Patient: Robert [name]           Date of Birth: Apr 16, 1946           MRN: [id number] Visit Date: 04/06/2021              Requested by: Gregor Hams, MD [place],  [place] [address] PCP: Vivi Barrack, MD   Assessment & Plan: Visit Diagnoses:  1. Unilateral primary osteoarthritis, left hip   2. Avascular necrosis of bone of hip, left ([place])   3. Chronic left-sided low back pain with left-sided sciatica     Plan: I spoke to him in length about hip replacement surgery.  I agree with the need for the surgery at this point given the MRI and plain film findings and clinical exam findings combined with very conservative treatment.  I explained in detail what hip replacement surgery involves and gave a handout about this.  I went over his x-rays.  We talked about the interoperative and postoperative course and discussed the risk and benefits of surgery.  I do feel is appropriate to obtain an MRI of his lumbar spine to assess the left-sided sciatica to see if he would benefit from any type of epidural steroid injection to help with those issues.  All questions and concerns were answered addressed.  We will work on scheduling surgery and the MRI.  I will call him with the results of the MRI.  Follow-Up Instructions: Return for 2 weeks post-op.      Orders:  No orders of the defined types were placed in this encounter.  No orders of the defined types were placed in this encounter.     Procedures: No procedures performed   Clinical Data: No additional findings.   Subjective: Chief Complaint  Patient presents with  . Left Hip - Pain  The patient is a very pleasant 74 year old gentleman referred from Lynne Leader to evaluate and treat left hip osteoarthritis with superimposed osteonecrosis.  He has had an intra-articular injection around the trochanteric area as well as the hip joint itself and it did help for a few days.  This is been slowly getting  worse for over a year now.  It hurts a lot with driving.  It hurts in the groin and in the sciatic region.  He is having a harder time with stiffness in that left hip and groin area when crossing his legs and putting his shoes and socks on.  He is a prediabetic.  Physical therapy is also not helped.  He tried anti-inflammatories and time as well as activity modification.  He is not obese.  At this point the left hip pain is detrimentally affecting his mobility, his quality of life and his actives daily living.  HPI  Review of Systems He currently denies any headache, chest pain, shortness of breath, fever, chills, nausea, vomiting  Objective: Vital Signs: There were no vitals taken for this visit.  Physical Exam He is alert and orient x3 and in no acute distress Ortho Exam Examination of his left hip shows stiffness with internal and external rotation as well as pain in the groin with rotation.  There is some mild pain with right hip.  There is no pain of the trochanteric area of the left side but definitely pain in the sciatic area and pain with sciatic stretch.  There is also associated left-sided low back pain. Specialty Comments:  No specialty comments available.  Imaging: No results found. The plain films of his pelvis and left hip as well as the MRI of his left hip were reviewed with him on the canopy system.  There is degenerative tearing of the anterior and superior labrum of the left hip with cartilage thinning and superimposed avascular necrosis.  There is no evidence of femoral head collapse.  The right hip has a mild area of AVN.  Previous spine films in 2018 show degenerative changes at multiple levels.  PMFS History: Patient Active Problem List   Diagnosis Date Noted  . AVN (avascular necrosis of bone) ([place]) 03/10/2021  . Type 2 diabetes mellitus with hyperglycemia ([place]) 05/23/2018  . Chronically dry eyes, bilateral 05/23/2018  . Migraines 11/28/2016  . Hypertension  associated with diabetes ([place]) 10/11/2011  . History of basal cell carcinoma (BCC), right ear 03/03/2009  . Hyperlipidemia associated with type 2 diabetes mellitus ([place]) 01/11/2008  . Allergic rhinitis 01/07/2008  . Prostatitis, recurrent 01/07/2008  . History of gastric ulcer 01/07/2008  . History of colon polyps, followed by Dr. Earlean Shawl 01/07/2008  . GERD 04/02/2007  . Sleep apnea 04/02/2007   Past Medical History:  Diagnosis Date  . CAD (coronary artery disease) 10/10/2011   NSTEMI with DES to the proximal RCA following flow wire evaluation. EF is normal.   . GERD (gastroesophageal reflux disease)   . H/O esophageal ulcer   . History of stomach ulcers   . Hyperlipidemia   . Hypertension   . Migraines    "quite often; maybe q 10d to 2 wk" (01/14/2014)  . Myocardial infarction ([place]) 10/2011  . OSA on CPAP    "wear it most of the time" (01/14/2014)  . 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 Squamous cell carcinoma 12/17/2016    Family History  Problem Relation Age of Onset  . Cancer Mother        lung  . Heart disease Father        heart attack   . Cancer Father        colon    Past Surgical History:  Procedure Laterality Date  . BACK SURGERY    . CATARACT EXTRACTION Bilateral 2019  . CATARACT EXTRACTION W/ INTRAOCULAR LENS IMPLANT Bilateral 01/2019  . CERVICAL DISC SURGERY  1980's?  . CORONARY ANGIOPLASTY WITH STENT PLACEMENT  10/13/2011   DES to the proximal RCA. Dr Martinique  . FRACTIONAL FLOW RESERVE WIRE Right 10/13/2011   Procedure: FRACTIONAL FLOW RESERVE WIRE;  Surgeon: Peter M Martinique, MD;  Location[facility] CATH LAB;  Service: Cardiovascular;  Laterality: Right;  . HERNIA REPAIR    . INGUINAL HERNIA REPAIR  10/23/2012   Procedure: HERNIA REPAIR INGUINAL ADULT;  Surgeon: Odis Hollingshead, MD;  Location: [place];  Service: General;  Laterality: Left;  Left lower quadrant; left inguinal hernia repair with mesh  . INSERTION OF MESH  10/23/2012   Procedure:  INSERTION OF MESH;  Surgeon: Odis Hollingshead, MD;  Location: [place];  Service: General;  Laterality: Left;  Left lower quadrant  . LEFT HEART CATHETERIZATION WITH CORONARY ANGIOGRAM N/A 10/13/2011   Procedure: LEFT HEART CATHETERIZATION WITH CORONARY ANGIOGRAM;  Surgeon: Peter M Martinique, MD;  Location: [facility] CATH LAB;  Service: Cardiovascular;  Laterality: N/A;  . PERCUTANEOUS CORONARY STENT INTERVENTION (PCI-S) Right 10/13/2011   Procedure: PERCUTANEOUS CORONARY STENT INTERVENTION (PCI-S);  Surgeon: Peter M Martinique, MD;  Location: [facility] CATH LAB;  Service: Cardiovascular;  Laterality: Right;  . SHOULDER  ARTHROSCOPY W/ ROTATOR CUFF REPAIR Left 1990's  . TRIGGER FINGER RELEASE Left    3rd and 4th digits   Social History   Occupational History    Comment: Retired -[employer]  Tobacco Use  . Smoking status: Former Smoker    Packs/day: 2.00    Years: 20.00    Pack years: 40.00    Types: Cigarettes    Quit date: 11/14/1982    Years since quitting: 38.4  . Smokeless tobacco: Never Used  Vaping Use  . Vaping Use: Never used  Substance and Sexual Activity  . Alcohol use: Yes    Alcohol/week: 6.0 standard drinks    Types: 6 Cans of beer per week  . Drug use: No  . Sexual activity: Not Currently

## 2021-04-08 ENCOUNTER — Encounter: Payer: Self-pay | Admitting: Physical Therapy

## 2021-04-08 NOTE — Therapy (Signed)
[facility] [address] [place], [place], [address] Phone: [phone]   Fax:  [phone]  Physical Therapy Treatment  Patient Details  Name: Robert [name] MRN: [id number] Date of Birth: 1946-05-10 Referring Provider (PT): Lynne Leader   Encounter Date: 04/06/2021   PT End of Session - 04/08/21 1135    Visit Number 2    Number of Visits 12    Date for PT Re-Evaluation 05/11/21    Authorization Type [employer]    PT Start Time 1346    PT Stop Time 1430    PT Time Calculation (min) 44 min    Activity Tolerance Patient tolerated treatment well    Behavior During Therapy [facility] for tasks assessed/performed           Past Medical History:  Diagnosis Date  . CAD (coronary artery disease) 10/10/2011   NSTEMI with DES to the proximal RCA following flow wire evaluation. EF is normal.   . GERD (gastroesophageal reflux disease)   . H/O esophageal ulcer   . History of stomach ulcers   . Hyperlipidemia   . Hypertension   . Migraines    "quite often; maybe q 10d to 2 wk" (01/14/2014)  . Myocardial infarction ([place]) 10/2011  . OSA on CPAP    "wear it most of the time" (01/14/2014)  . 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 Squamous cell carcinoma 12/17/2016    Past Surgical History:  Procedure Laterality Date  . BACK SURGERY    . CATARACT EXTRACTION Bilateral 2019  . CATARACT EXTRACTION W/ INTRAOCULAR LENS IMPLANT Bilateral 01/2019  . CERVICAL DISC SURGERY  1980's?  . CORONARY ANGIOPLASTY WITH STENT PLACEMENT  10/13/2011   DES to the proximal RCA. Dr Martinique  . FRACTIONAL FLOW RESERVE WIRE Right 10/13/2011   Procedure: FRACTIONAL FLOW RESERVE WIRE;  Surgeon: Peter M Martinique, MD;  Location: [facility] C[facility] LAB;  Service: Cardiovascular;  Laterality: Right;  . HERNIA REPAIR    . INGUINAL HERNIA REPAIR  10/23/2012   Procedure: HERNIA REPAIR INGUINAL ADULT;  Surgeon: Odis Hollingshead, MD;  Location: [place];  Service: General;  Laterality:  Left;  Left lower quadrant; left inguinal hernia repair with mesh  . INSERTION OF MESH  10/23/2012   Procedure: INSERTION OF MESH;  Surgeon: Odis Hollingshead, MD;  Location: [place];  Service: General;  Laterality: Left;  Left lower quadrant  . LEFT HEART CATHETERIZATION WITH CORONARY ANGIOGRAM N/A 10/13/2011   Procedure: LEFT HEART CATHETERIZATION WITH CORONARY ANGIOGRAM;  Surgeon: Peter M Martinique, MD;  Location: [facility] CATH LAB;  Service: Cardiovascular;  Laterality: N/A;  . PERCUTANEOUS CORONARY STENT INTERVENTION (PCI-S) Right 10/13/2011   Procedure: PERCUTANEOUS CORONARY STENT INTERVENTION (PCI-S);  Surgeon: Peter M Martinique, MD;  Location: [facility] CATH LAB;  Service: Cardiovascular;  Laterality: Right;  . SHOULDER ARTHROSCOPY W/ ROTATOR CUFF REPAIR Left 1990's  . TRIGGER FINGER RELEASE Left    3rd and 4th digits    There were no vitals filed for this visit.   Subjective Assessment - 04/08/21 1132    Subjective PT states continued pain in glute region. He did have initial visit with Ortho today, and is scheduled for L hip replacement on July 1st. He is also going to have MRI scheduled to rule out lumbar findings.    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 Yes    Pain Score 4     Pain Location Hip    Pain Orientation Left    Pain Descriptors / Indicators Aching    Pain Type Acute pain    Pain Onset More than a month ago    Pain Frequency Intermittent    Aggravating Factors  sitting                             [facility] Adult PT Treatment/Exercise - 04/08/21 0001      Ambulation/Gait   Gait Comments Decreased hip extension, decreased trunk rotation bilaterally.      Exercises   Exercises Lumbar      Lumbar Exercises: Stretches   Active Hamstring Stretch  2 reps;30 seconds    Active Hamstring Stretch Limitations seated    Single Knee to Chest Stretch 3 reps;30 seconds    Pelvic Tilt 20 reps    Piriformis Stretch 2 reps;30 seconds    Piriformis Stretch Limitations modified fig 4, supine (pain on L )      Lumbar Exercises: Aerobic   Recumbent Bike L2 x 5 min;      Lumbar Exercises: Supine   Bridge 15 reps    Straight Leg Raise 10 reps    Straight Leg Raises Limitations bil      Manual Therapy   Manual Therapy Soft tissue mobilization;Joint mobilization;Manual Traction    Manual therapy comments skilled palpation and monitoring of soft tissue with dry needling.    Soft tissue mobilization STM/DTM to R glute and piriformis    Manual Traction Long leg distraction on R for hip and lumbar pump x  2 min                  PT Education - 04/08/21 1133    Education Details Discussed symptoms, pain location and treatment,discussed glute pain could be stemming from hip, back or  muscle tension    Person(s) Educated Patient    Methods Explanation;Demonstration;Tactile cues;Verbal cues    Comprehension Verbalized understanding;Returned demonstration;Verbal cues required;Tactile cues required;Need further instruction            PT Short Term Goals - 03/30/21 1044      PT SHORT TERM GOAL #1   Title Pt to be independent with initial HEP    Time 2    Period Weeks    Status New    Target Date 04/13/21             PT Long Term Goals - 03/30/21 1047      PT LONG TERM GOAL #1   Title Pt to be independent with final HEP    Time 6    Period Weeks    Status New    Target Date 05/11/21      PT LONG TERM GOAL #2   Title Pt to report decreased pain in bil hips to 0-3/10 with sitting, riding in car, and with activity    Time 6    Period Weeks    Status New    Target Date 05/11/21      PT LONG TERM GOAL #3   Title Pt to demo hip and lumbar mobility to have only mild/moderate limitation for flexion, to imrpove ability for  forward bending, ADLS, and  IADLs.    Time 6  Period Weeks    Status New    Target Date 05/11/21                 Plan - 04/08/21 1136    Clinical Impression Statement Pt with most pain located posteriorly in glute/piriformis, increased with sitting. Discussed possibility of muscle tension causing pain, and focus of treatment for this today. Also discussed possibility of lumbar radiculopathy vs hip OA. Pt having upcoming lumbar MRI to rule out lumbar findings, prior to undergoing hip replacement. Pt to benefit from continued care.    Personal Factors and Comorbidities Comorbidity 1    Comorbidities AVN, OA,    Examination-Activity Limitations Bend;Sit;Carry;Squat;Stand;Locomotion Level    Examination-Participation Restrictions Cleaning;Community Activity;Driving;Yard Work    Stability/Clinical Decision Making Stable/Uncomplicated    Rehab Potential Good    PT Frequency 2x / week    PT Duration 6 weeks    PT Treatment/Interventions ADLs/Self Care Home Management;Cryotherapy;Electrical Stimulation;Iontophoresis 4mg /ml Dexamethasone;Moist Heat;Traction;Ultrasound;Gait training;DME Instruction;Stair training;Functional mobility training;Therapeutic activities;Therapeutic exercise;Balance training;Neuromuscular re-education;Manual techniques;Orthotic Fit/Training;Patient/family education;Passive range of motion;Dry needling;Taping;Vasopneumatic Device;Spinal Manipulations;Joint Manipulations    PT Home Exercise Plan [id number]    Consulted and Agree with Plan of Care Patient           Patient will benefit from skilled therapeutic intervention in order to improve the following deficits and impairments:  Abnormal gait,Decreased range of motion,Increased muscle spasms,Decreased activity tolerance,Pain,Hypomobility,Impaired flexibility,Improper body mechanics,Decreased mobility,Decreased strength  Visit Diagnosis: Pain in left hip  Pain in right hip  Stiffness of left hip, not elsewhere  classified  Stiffness of right hip, not elsewhere classified     Problem List Patient Active Problem List   Diagnosis Date Noted  . AVN (avascular necrosis of bone) ([place]) 03/10/2021  . Type 2 diabetes mellitus with hyperglycemia ([place]) 05/23/2018  . Chronically dry eyes, bilateral 05/23/2018  . Migraines 11/28/2016  . Hypertension associated with diabetes ([place]) 10/11/2011  . History of basal cell carcinoma (BCC), right ear 03/03/2009  . Hyperlipidemia associated with type 2 diabetes mellitus ([place]) 01/11/2008  . Allergic rhinitis 01/07/2008  . Prostatitis, recurrent 01/07/2008  . History of gastric ulcer 01/07/2008  . History of colon polyps, followed by Dr. Earlean Shawl 01/07/2008  . GERD 04/02/2007  . Sleep apnea 04/02/2007    Lyndee Hensen, PT, DPT 11:40 AM  04/08/21    [facility] [place], [place], [address] Phone: [phone]   Fax:  [phone]  Name: Robert [name] MRN: [id number] Date of Birth: 06/30/1946

## 2021-04-12 ENCOUNTER — Other Ambulatory Visit: Payer: Self-pay | Admitting: Family Medicine

## 2021-04-13 ENCOUNTER — Other Ambulatory Visit: Payer: Self-pay

## 2021-04-14 DIAGNOSIS — L7211 Pilar cyst: Secondary | ICD-10-CM | POA: Diagnosis not present

## 2021-04-20 ENCOUNTER — Other Ambulatory Visit: Payer: Self-pay

## 2021-04-20 ENCOUNTER — Encounter: Payer: Self-pay | Admitting: Physical Therapy

## 2021-04-20 ENCOUNTER — Ambulatory Visit: Payer: Medicare HMO | Admitting: Physical Therapy

## 2021-04-20 DIAGNOSIS — M25552 Pain in left hip: Secondary | ICD-10-CM

## 2021-04-20 DIAGNOSIS — M25551 Pain in right hip: Secondary | ICD-10-CM | POA: Diagnosis not present

## 2021-04-20 DIAGNOSIS — M25651 Stiffness of right hip, not elsewhere classified: Secondary | ICD-10-CM

## 2021-04-20 DIAGNOSIS — M25652 Stiffness of left hip, not elsewhere classified: Secondary | ICD-10-CM

## 2021-04-20 NOTE — Therapy (Addendum)
[facility] [address] [place], [place], [address] Phone: [phone]   Fax:  [phone]  Physical Therapy Treatment  Patient Details  Name: Robert [name] MRN: [id number] Date of Birth: 1946-08-21 Referring Provider (PT): Lynne Leader   Encounter Date: 04/20/2021   PT End of Session - 04/20/21 0944     Visit Number 3    Number of Visits 12    Date for PT Re-Evaluation 05/11/21    Authorization Type [employer]    PT Start Time [phone]    PT Stop Time 1015    PT Time Calculation (min) 38 min    Activity Tolerance Patient tolerated treatment well    Behavior During Therapy [facility] for tasks assessed/performed             Past Medical History:  Diagnosis Date   CAD (coronary artery disease) 10/10/2011   NSTEMI with DES to the proximal RCA following flow wire evaluation. EF is normal.    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   CORONARY ANGIOPLASTY WITH STENT PLACEMENT  10/13/2011   DES to the proximal RCA. Dr Martinique   FRACTIONAL FLOW RESERVE WIRE Right 10/13/2011   Procedure: [place];  Surgeon: Peter M Martinique, MD;  Location: [facility] CATH LAB;  Service: Cardiovascular;  Laterality: Right;   HERNIA REPAIR     INGUINAL HERNIA REPAIR  10/23/2012   Procedure: HERNIA REPAIR INGUINAL ADULT;  Surgeon: Odis Hollingshead, MD;  Location: [place];  Service: General;  Laterality: Left;  Left lower  quadrant; left inguinal hernia repair with mesh   INSERTION OF MESH  10/23/2012   Procedure: INSERTION OF MESH;  Surgeon: Odis Hollingshead, MD;  Location: [place];  Service: General;  Laterality: Left;  Left lower quadrant   LEFT HEART CATHETERIZATION WITH CORONARY ANGIOGRAM N/A 10/13/2011   Procedure: LEFT HEART CATHETERIZATION WITH CORONARY ANGIOGRAM;  Surgeon: Peter M Martinique, MD;  Location: [facility] CATH LAB;  Service: Cardiovascular;  Laterality: N/A;   PERCUTANEOUS CORONARY STENT INTERVENTION (PCI-S) Right 10/13/2011   Procedure: PERCUTANEOUS CORONARY STENT INTERVENTION (PCI-S);  Surgeon: Peter M Martinique, MD;  Location: [facility] CATH LAB;  Service: Cardiovascular;  Laterality: Right;   SHOULDER ARTHROSCOPY W/ ROTATOR CUFF REPAIR Left 1990's   TRIGGER FINGER RELEASE Left    3rd and 4th digits    There were no vitals filed for this visit.   Subjective Assessment - 04/20/21 0943     Subjective Pt states no change in pain. Still having pain in glute, mostly with sitting and driving /riding in car.    Currently in Pain? Yes    Pain Score 4     Pain Location Hip    Pain Orientation Left    Pain Descriptors / Indicators Aching    Pain Type Acute pain  Pain Onset More than a month ago    Pain Frequency Intermittent                               [facility] Adult PT Treatment/Exercise - 04/20/21 0001       Ambulation/Gait   Gait Comments Decreased hip extension, decreased trunk rotation bilaterally.      Exercises   Exercises Lumbar      Lumbar Exercises: Stretches   Active Hamstring Stretch 2 reps;30 seconds    Active Hamstring Stretch Limitations seated    Single Knee to Chest Stretch 30 seconds;1 rep    Double Knee to Chest Stretch 2 reps;30 seconds    Pelvic Tilt 20 reps    Piriformis Stretch 2 reps;30 seconds    Piriformis Stretch Limitations --    Other Lumbar Stretch Exercise seated lumbar flexion 20 sec x 5;      Lumbar Exercises: Aerobic   Recumbent Bike L3 x 7  min;      Lumbar Exercises: Seated   Sit to Stand --    Other Seated Lumbar Exercises pelvic tilts x20      Lumbar Exercises: Supine   Bridge --    Straight Leg Raise --    Straight Leg Raises Limitations --      Lumbar Exercises: Sidelying   Hip Abduction --      Manual Therapy   Manual Therapy Soft tissue mobilization;Joint mobilization;Manual Traction    Joint Mobilization Lumbar PA and UPAs gr 3 bil;    Soft tissue mobilization --    Manual Traction Long leg distraction on R for hip and lumbar pump x  2 min                      PT Short Term Goals - 03/30/21 1044       PT SHORT TERM GOAL #1   Title Pt to be independent with initial HEP    Time 2    Period Weeks    Status New    Target Date 04/13/21               PT Long Term Goals - 03/30/21 1047       PT LONG TERM GOAL #1   Title Pt to be independent with final HEP    Time 6    Period Weeks    Status New    Target Date 05/11/21      PT LONG TERM GOAL #2   Title Pt to report decreased pain in bil hips to 0-3/10 with sitting, riding in car, and with activity    Time 6    Period Weeks    Status New    Target Date 05/11/21      PT LONG TERM GOAL #3   Title Pt to demo hip and lumbar mobility to have only mild/moderate limitation for flexion, to imrpove ability for forward bending, ADLS, and  IADLs.    Time 6    Period Weeks    Status New    Target Date 05/11/21                   Plan - 04/20/21 1540     Clinical Impression Statement Focus on mobility and ther ex for lumbar spine today, in attempts for decreasing/changing pain in glute. Will assess effects next visit. HEP updated to include. Pt has not had significant change in  pain with dry needling last visit, but may benefit from future sessions. Pt to benefit from continued lumbar mobility as well as hip strengthening for pain relief.    Personal Factors and Comorbidities Comorbidity 1    Comorbidities AVN, OA,     Examination-Activity Limitations Bend;Sit;Carry;Squat;Stand;Locomotion Level    Examination-Participation Restrictions Cleaning;Community Activity;Driving;Yard Work    Stability/Clinical Decision Making Stable/Uncomplicated    Rehab Potential Good    PT Frequency 2x / week    PT Duration 6 weeks    PT Treatment/Interventions ADLs/Self Care Home Management;Cryotherapy;Electrical Stimulation;Iontophoresis 4mg /ml Dexamethasone;Moist Heat;Traction;Ultrasound;Gait training;DME Instruction;Stair training;Functional mobility training;Therapeutic activities;Therapeutic exercise;Balance training;Neuromuscular re-education;Manual techniques;Orthotic Fit/Training;Patient/family education;Passive range of motion;Dry needling;Taping;Vasopneumatic Device;Spinal Manipulations;Joint Manipulations    PT Home Exercise Plan [id number]    Consulted and Agree with Plan of Care Patient             Patient will benefit from skilled therapeutic intervention in order to improve the following deficits and impairments:  Abnormal gait,Decreased range of motion,Increased muscle spasms,Decreased activity tolerance,Pain,Hypomobility,Impaired flexibility,Improper body mechanics,Decreased mobility,Decreased strength  Visit Diagnosis: Pain in left hip  Pain in right hip  Stiffness of left hip, not elsewhere classified  Stiffness of right hip, not elsewhere classified     Problem List Patient Active Problem List   Diagnosis Date Noted   AVN (avascular necrosis of bone) ([place]) 03/10/2021   Type 2 diabetes mellitus with hyperglycemia ([place]) 05/23/2018   Chronically dry eyes, bilateral 05/23/2018   Migraines 11/28/2016   Hypertension associated with diabetes ([place]) 10/11/2011   History of basal cell carcinoma (BCC), right ear 03/03/2009   Hyperlipidemia associated with type 2 diabetes mellitus ([place]) 01/11/2008   Allergic rhinitis 01/07/2008   Prostatitis, recurrent 01/07/2008   History of gastric ulcer 01/07/2008    History of colon polyps, followed by Dr. Earlean Shawl 01/07/2008   GERD 04/02/2007   Sleep apnea 04/02/2007    Lyndee Hensen, PT, DPT 3:42 PM  04/20/21    [facility] [place], [place], [address] Phone: [phone]   Fax:  [phone]  Name: Robert [name] MRN: [id number] Date of Birth: May 31, 1946   PHYSICAL THERAPY DISCHARGE SUMMARY  Visits from Start of Care:3 Plan: Patient agrees to discharge.  Patient goals were not met. Patient is being discharged due to- not returning since last visit.      Lyndee Hensen, PT, DPT 3:47 PM  08/15/22

## 2021-04-24 ENCOUNTER — Ambulatory Visit (INDEPENDENT_AMBULATORY_CARE_PROVIDER_SITE_OTHER): Payer: Medicare HMO

## 2021-04-24 ENCOUNTER — Other Ambulatory Visit: Payer: Self-pay

## 2021-04-24 DIAGNOSIS — M4807 Spinal stenosis, lumbosacral region: Secondary | ICD-10-CM | POA: Diagnosis not present

## 2021-04-24 DIAGNOSIS — M545 Low back pain, unspecified: Secondary | ICD-10-CM | POA: Diagnosis not present

## 2021-04-27 ENCOUNTER — Encounter: Payer: Medicare HMO | Admitting: Physical Therapy

## 2021-05-03 ENCOUNTER — Other Ambulatory Visit: Payer: Self-pay | Admitting: Physician Assistant

## 2021-05-04 ENCOUNTER — Encounter: Payer: Medicare HMO | Admitting: Physical Therapy

## 2021-05-06 NOTE — Patient Instructions (Addendum)
DUE TO COVID-19 ONLY ONE VISITOR IS ALLOWED TO COME WITH YOU AND STAY IN THE WAITING ROOM ONLY DURING PRE OP AND PROCEDURE DAY OF SURGERY. THE 1 VISITOR  MAY VISIT WITH YOU AFTER SURGERY IN YOUR PRIVATE ROOM DURING VISITING HOURS ONLY!  YOU NEED TO HAVE A COVID 19 TEST ON: 05/12/21 @ 9:00 AM, THIS TEST MUST BE DONE BEFORE SURGERY,  COVID TESTING SITE [place] [address], IT IS ON THE RIGHT GOING OUT [address] APPROXIMATELY  2 MINUTES PAST [employer] ON THE RIGHT. ONCE YOUR COVID TEST IS COMPLETED,  PLEASE BEGIN THE QUARANTINE INSTRUCTIONS AS OUTLINED IN YOUR HANDOUT.                Robert [name]   Your procedure is scheduled on: 05/14/21   Report to [facility] Main  Entrance   Report to admitting at : 6:00 AM     Call this number if you have problems the morning of surgery [phone]    Remember: NO SOLID FOOD AFTER MIDNIGHT THE NIGHT PRIOR TO SURGERY. NOTHING BY MOUTH EXCEPT CLEAR LIQUIDS UNTIL: 5:30 AM . PLEASE FINISH ENSURE DRINK PER SURGEON ORDER  WHICH NEEDS TO BE COMPLETED AT : 5:30 AM.   CLEAR LIQUID DIET  Foods Allowed                                                                     Foods Excluded  Coffee and tea, regular and decaf                             liquids that you cannot  Plain Jell-O any favor except red or purple                                           see through such as: Fruit ices (not with fruit pulp)                                     milk, soups, orange juice  Iced Popsicles                                    All solid food Carbonated beverages, regular and diet                                    Cranberry, grape and apple juices Sports drinks like Gatorade Lightly seasoned clear broth or consume(fat free) Sugar, honey syrup  Sample Menu Breakfast                                Lunch  Supper Cranberry juice                    Beef broth                             Chicken broth Jell-O                                     Grape juice                           Apple juice Coffee or tea                        Jell-O                                      Popsicle                                                Coffee or tea                        Coffee or tea  _____________________________________________________________________   BRUSH YOUR TEETH MORNING OF SURGERY AND RINSE YOUR MOUTH OUT, NO CHEWING GUM CANDY OR MINTS.    Take these medicines the morning of surgery with A SIP OF WATER: metoprolol,pantoprazole.Use flonase and eye drops as usual.                               You may not have any metal on your body including hair pins and              piercings  Do not wear jewelry, lotions, powders or perfumes, deodorant              Men may shave face and neck.   Do not bring valuables to the hospital. [place].  Contacts, dentures or bridgework may not be worn into surgery.  Leave suitcase in the car. After surgery it may be brought to your room.     Patients discharged the day of surgery will not be allowed to drive home. IF YOU ARE HAVING SURGERY AND GOING HOME THE SAME DAY, YOU MUST HAVE AN ADULT TO DRIVE YOU HOME AND BE WITH YOU FOR 24 HOURS. YOU MAY GO HOME BY TAXI OR UBER OR ORTHERWISE, BUT AN ADULT MUST ACCOMPANY YOU HOME AND STAY WITH YOU FOR 24 HOURS.  Name and phone number of your driver:  Special Instructions: N/A              Please read over the following fact sheets you were given: _____________________________________________________________________           [facility] - Preparing for Surgery Before surgery, you can play an important role.  Because skin is not sterile, your skin needs to be as free of germs as possible.  You can reduce the number of germs  on your skin by washing with CHG (chlorahexidine gluconate) soap before surgery.  CHG is an antiseptic cleaner which kills germs  and bonds with the skin to continue killing germs even after washing. Please DO NOT use if you have an allergy to CHG or antibacterial soaps.  If your skin becomes reddened/irritated stop using the CHG and inform your nurse when you arrive at Short Stay. Do not shave (including legs and underarms) for at least 48 hours prior to the first CHG shower.  You may shave your face/neck. Please follow these instructions carefully:  1.  Shower with CHG Soap the night before surgery and the  morning of Surgery.  2.  If you choose to wash your hair, wash your hair first as usual with your  normal  shampoo.  3.  After you shampoo, rinse your hair and body thoroughly to remove the  shampoo.                           4.  Use CHG as you would any other liquid soap.  You can apply chg directly  to the skin and wash                       Gently with a scrungie or clean washcloth.  5.  Apply the CHG Soap to your body ONLY FROM THE NECK DOWN.   Do not use on face/ open                           Wound or open sores. Avoid contact with eyes, ears mouth and genitals (private parts).                       Wash face,  Genitals (private parts) with your normal soap.             6.  Wash thoroughly, paying special attention to the area where your surgery  will be performed.  7.  Thoroughly rinse your body with warm water from the neck down.  8.  DO NOT shower/wash with your normal soap after using and rinsing off  the CHG Soap.                9.  Pat yourself dry with a clean towel.            10.  Wear clean pajamas.            11.  Place clean sheets on your bed the night of your first shower and do not  sleep with pets. Day of Surgery : Do not apply any lotions/deodorants the morning of surgery.  Please wear clean clothes to the hospital/surgery center.  FAILURE TO FOLLOW THESE INSTRUCTIONS MAY RESULT IN THE CANCELLATION OF YOUR SURGERY PATIENT SIGNATURE_________________________________  NURSE  SIGNATURE__________________________________  ________________________________________________________________________   Robert [name]  An incentive spirometer is a tool that can help keep your lungs clear and active. This tool measures how well you are filling your lungs with each breath. Taking long deep breaths may help reverse or decrease the chance of developing breathing (pulmonary) problems (especially infection) following: A long period of time when you are unable to move or be active. BEFORE THE PROCEDURE  If the spirometer includes an indicator to show your best effort, your nurse or respiratory therapist will set it to a desired goal. If possible, sit  up straight or lean slightly forward. Try not to slouch. Hold the incentive spirometer in an upright position. INSTRUCTIONS FOR USE  Sit on the edge of your bed if possible, or sit up as far as you can in bed or on a chair. Hold the incentive spirometer in an upright position. Breathe out normally. Place the mouthpiece in your mouth and seal your lips tightly around it. Breathe in slowly and as deeply as possible, raising the piston or the ball toward the top of the column. Hold your breath for 3-5 seconds or for as long as possible. Allow the piston or ball to fall to the bottom of the column. Remove the mouthpiece from your mouth and breathe out normally. Rest for a few seconds and repeat Steps 1 through 7 at least 10 times every 1-2 hours when you are awake. Take your time and take a few normal breaths between deep breaths. The spirometer may include an indicator to show your best effort. Use the indicator as a goal to work toward during each repetition. After each set of 10 deep breaths, practice coughing to be sure your lungs are clear. If you have an incision (the cut made at the time of surgery), support your incision when coughing by placing a pillow or rolled up towels firmly against it. Once you are able to get out of  bed, walk around indoors and cough well. You may stop using the incentive spirometer when instructed by your caregiver.  RISKS AND COMPLICATIONS Take your time so you do not get dizzy or light-headed. If you are in pain, you may need to take or ask for pain medication before doing incentive spirometry. It is harder to take a deep breath if you are having pain. AFTER USE Rest and breathe slowly and easily. It can be helpful to keep track of a log of your progress. Your caregiver can provide you with a simple table to help with this. If you are using the spirometer at home, follow these instructions: [place] IF:  You are having difficultly using the spirometer. You have trouble using the spirometer as often as instructed. Your pain medication is not giving enough relief while using the spirometer. You develop fever of 100.5 F (38.1 C) or higher. SEEK IMMEDIATE MEDICAL CARE IF:  You cough up bloody sputum that had not been present before. You develop fever of 102 F (38.9 C) or greater. You develop worsening pain at or near the incision site. MAKE SURE YOU:  Understand these instructions. Will watch your condition. Will get help right away if you are not doing well or get worse. Document Released: 03/13/2007 Document Revised: 01/23/2012 Document Reviewed: 05/14/2007 [facility] Patient Information 2014 [place], [place].   ________________________________________________________________________

## 2021-05-07 ENCOUNTER — Other Ambulatory Visit: Payer: Self-pay

## 2021-05-07 ENCOUNTER — Encounter (HOSPITAL_COMMUNITY)
Admission: RE | Admit: 2021-05-07 | Discharge: 2021-05-07 | Disposition: A | Discharge status: Home / Self Care | Payer: Medicare HMO | Source: Ambulatory Visit | Attending: Orthopaedic Surgery | Admitting: Orthopaedic Surgery

## 2021-05-07 ENCOUNTER — Encounter (HOSPITAL_COMMUNITY): Payer: Self-pay

## 2021-05-07 DIAGNOSIS — Z01812 Encounter for preprocedural laboratory examination: Secondary | ICD-10-CM | POA: Diagnosis not present

## 2021-05-07 HISTORY — DX: Unspecified osteoarthritis, unspecified site: M19.90

## 2021-05-07 HISTORY — DX: Prediabetes: R73.03

## 2021-05-07 LAB — CBC
HCT: 42.8 % (ref 39.0–52.0)
Hemoglobin: 14.1 g/dL (ref 13.0–17.0)
MCH: 31.8 pg (ref 26.0–34.0)
MCHC: 32.9 g/dL (ref 30.0–36.0)
MCV: 96.4 fL (ref 80.0–100.0)
Platelets: 166 10*3/uL (ref 150–400)
RBC: 4.44 MIL/uL (ref 4.22–5.81)
RDW: 13.1 % (ref 11.5–15.5)
WBC: 8 10*3/uL (ref 4.0–10.5)
nRBC: 0 % (ref 0.0–0.2)

## 2021-05-07 LAB — SURGICAL PCR SCREEN
MRSA, PCR: NEGATIVE
Staphylococcus aureus: NEGATIVE

## 2021-05-07 LAB — BASIC METABOLIC PANEL
Anion gap: 7 (ref 5–15)
BUN: 16 mg/dL (ref 8–23)
CO2: 28 mmol/L (ref 22–32)
Calcium: 9.5 mg/dL (ref 8.9–10.3)
Chloride: 103 mmol/L (ref 98–111)
Creatinine, Ser: 0.77 mg/dL (ref 0.61–1.24)
GFR, Estimated: >60 mL/min (ref >60)
Glucose, Bld: 170 mg/dL — ABNORMAL HIGH (ref 70–99)
Potassium: 4.9 mmol/L (ref 3.5–5.1)
Sodium: 138 mmol/L (ref 135–145)

## 2021-05-07 NOTE — Progress Notes (Addendum)
COVID Vaccine Completed: Yes Date COVID Vaccine completed: 09/01/20 [place] COVID vaccine manufacturer:   Moderna     PCP - Dr. Vivi Barrack. LOV: 03/02/21 Cardiologist - Dr. Peter Martinique. LOV: 06/09/20.  Chest x-ray -  EKG - 06/10/20 EPIC Stress Test -  ECHO - 2012 Cardiac Cath -  Pacemaker/ICD device last checked:  Sleep Study - Yes CPAP - Yes  Fasting Blood Sugar -  Checks Blood Sugar _____ times a day  Blood Thinner Instructions: Aspirin Instructions: Last Dose:  Anesthesia review: Hx: HTN,MI,CAD,OSA(CPAP)  Patient denies shortness of breath, fever, cough and chest pain at PAT appointment   Patient verbalized understanding of instructions that were given to them at the PAT appointment. Patient was also instructed that they will need to review over the PAT instructions again at home before surgery.

## 2021-05-10 NOTE — Progress Notes (Signed)
Anesthesia Chart Review   Case: [id number] Date/Time: 05/14/21 0815   Procedure: LEFT TOTAL HIP ARTHROPLASTY ANTERIOR APPROACH (Left: Hip)   Anesthesia type: Spinal   Pre-op diagnosis: Osteoarthritis Left Hip   Location: [facility] / [facility]   Surgeons: Mcarthur Rossetti, MD       DISCUSSION:75 y.o. former smoker with h/o GERD, HTN, OSA on CPAP, CAD (DES to proximal RCA 09/2011), left hip OA scheduled for above procedure 05/14/21 with Dr. Jean Rosenthal.   Pt last seen by cardiology 06/09/2020.  Per OV note, "Coronary artery disease status post drug-eluting stent to the proximal RCA in November 2012. Myoview March 2015 was normal. He remains asymptomatic. Continue ASA, statin,  metoprolol. Follow up in one year."  VS: BP (!) 145/68   Pulse 62   Temp 36.5 C (Oral)   Ht 6\' 1"  (1.854 m)   Wt 98 kg   SpO2 96%   BMI 28.50 kg/m   PROVIDERS: Vivi Barrack, MD is PCP   Martinique, Peter, MD is Cardiologist  LABS: Labs reviewed: Acceptable for surgery. (all labs ordered are listed, but only abnormal results are displayed)  Labs Reviewed  BASIC METABOLIC PANEL - Abnormal; Notable for the following components:      Result Value   Glucose, Bld 170 (*)    All other components within normal limits  SURGICAL PCR SCREEN  CBC  TYPE AND SCREEN     IMAGES:   EKG: 06/10/2020 Rate 52 bpm  Sinus bradycardia   CV: Echo 10/11/2011 Study Conclusions   - Left ventricle: The cavity size was normal. There was mild    concentric hypertrophy. Systolic function was normal. The    estimated ejection fraction was in the range of 55% to    60%. Wall motion was normal; there were no regional wall    motion abnormalities. Doppler parameters are consistent    with abnormal left ventricular relaxation (grade 1    diastolic dysfunction). The E/e' ratio is >10, suggesting    elevated LV filling pressure.  - Left atrium: The atrium is mildly dilated. Past Medical History:  Diagnosis Date    Arthritis    CAD (coronary artery disease) 10/10/2011   NSTEMI with DES to the proximal RCA following flow wire evaluation. EF is normal.    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   CORONARY ANGIOPLASTY WITH STENT PLACEMENT  10/13/2011   DES to the proximal RCA. Dr Martinique   FRACTIONAL FLOW RESERVE WIRE Right 10/13/2011   Procedure: [place];  Surgeon: Peter M Martinique, MD;  Location: [facility] CATH LAB;  Service: Cardiovascular;  Laterality: Right;   HERNIA REPAIR     INGUINAL HERNIA REPAIR  10/23/2012   Procedure: HERNIA REPAIR INGUINAL ADULT;  Surgeon: Odis Hollingshead, MD;  Location:  [place] OR;  Service: General;  Laterality: Left;  Left lower quadrant; left inguinal hernia repair with mesh   INSERTION OF MESH  10/23/2012   Procedure: INSERTION OF MESH;  Surgeon: Odis Hollingshead, MD;  Location: [place];  Service: General;  Laterality: Left;  Left lower quadrant   LEFT HEART CATHETERIZATION WITH CORONARY ANGIOGRAM N/A 10/13/2011   Procedure: LEFT HEART CATHETERIZATION WITH CORONARY ANGIOGRAM;  Surgeon: Peter M Martinique, MD;  Location: [facility] CATH LAB;  Service: Cardiovascular;  Laterality: N/A;   PERCUTANEOUS CORONARY STENT INTERVENTION (PCI-S) Right 10/13/2011   Procedure: PERCUTANEOUS CORONARY STENT INTERVENTION (PCI-S);  Surgeon: Peter M Martinique, MD;  Location: [facility] CATH LAB;  Service: Cardiovascular;  Laterality: Right;    SHOULDER ARTHROSCOPY W/ ROTATOR CUFF REPAIR Left 1990's   TRIGGER FINGER RELEASE Left    3rd and 4th digits    MEDICATIONS:  Ascorbic Acid (VITAMIN C) 1000 MG tablet   aspirin EC 81 MG tablet   azelastine (ASTELIN) 0.1 % nasal spray   B Complex-C (SUPER B COMPLEX PO)   brimonidine (ALPHAGAN) 0.2 % ophthalmic solution   diclofenac sodium (VOLTAREN) 1 % GEL   dicyclomine (BENTYL) 10 MG capsule   ezetimibe (ZETIA) 10 MG tablet   fluticasone (FLONASE) 50 MCG/ACT nasal spray   latanoprost (XALATAN) 0.005 % ophthalmic solution   metoprolol tartrate (LOPRESSOR) 50 MG tablet   Multiple Vitamins-Minerals (MULTIVITAMINS THER. W/MINERALS) TABS   nitroGLYCERIN (NITROSTAT) 0.4 MG SL tablet   pantoprazole (PROTONIX) 40 MG tablet   Propylene Glycol (SYSTANE BALANCE) 0.6 % SOLN   rosuvastatin (CRESTOR) 40 MG tablet   traMADol (ULTRAM) 50 MG tablet   vitamin E 180 MG (400 UNITS) capsule   No current facility-administered medications for this encounter.     Konrad Felix, PA-C [facility] Pre-Surgical Testing [phone]

## 2021-05-12 ENCOUNTER — Telehealth: Payer: Self-pay | Admitting: *Deleted

## 2021-05-12 ENCOUNTER — Other Ambulatory Visit (HOSPITAL_COMMUNITY)
Admission: RE | Admit: 2021-05-12 | Discharge: 2021-05-12 | Disposition: A | Discharge status: Home / Self Care | Payer: Medicare HMO | Source: Ambulatory Visit | Attending: Orthopaedic Surgery | Admitting: Orthopaedic Surgery

## 2021-05-12 DIAGNOSIS — Z01812 Encounter for preprocedural laboratory examination: Secondary | ICD-10-CM | POA: Insufficient documentation

## 2021-05-12 DIAGNOSIS — Z20822 Contact with and (suspected) exposure to covid-19: Secondary | ICD-10-CM | POA: Insufficient documentation

## 2021-05-12 LAB — SARS CORONAVIRUS 2 (TAT 6-24 HRS): SARS Coronavirus 2: NEGATIVE

## 2021-05-12 NOTE — Care Plan (Signed)
[facility] RN Case manager call to patient prior to his Left total hip replacement with Dr. Ninfa Linden. He is an Ortho [facility] patient through [facility] and is agreeable to case management. Patient lives with his wife, who will be assisting at home after surgery. He will need a FWW; declined a 3in1/BSC at this time. Unsure of need for HHPT at this time. Will see how patient does with therapy after surgery. Referral however has been made to [facility] after choice provided in case this is needed. Reviewed all post op care instructions. Will continue to follow for needs.

## 2021-05-12 NOTE — Telephone Encounter (Signed)
Ortho [facility] pre-op call completed. 

## 2021-05-13 DIAGNOSIS — M87052 Idiopathic aseptic necrosis of left femur: Secondary | ICD-10-CM

## 2021-05-13 NOTE — H&P (Signed)
TOTAL HIP ADMISSION H&P  Patient is admitted for left total hip arthroplasty.  Subjective:  Chief Complaint: left hip pain  HPI: Robert [name], 75 y.o. male, has a history of pain and functional disability in the left hip(s) due to  avascular necrosis  and patient has failed non-surgical conservative treatments for greater than 12 weeks to include NSAID's and/or analgesics, corticosteriod injections, and activity modification.  Onset of symptoms was abrupt starting 1 years ago with rapidlly worsening course since that time.The patient noted no past surgery on the left hip(s).  Patient currently rates pain in the left hip at 10 out of 10 with activity. Patient has night pain, worsening of pain with activity and weight bearing, pain that interfers with activities of daily living, and pain with passive range of motion. Patient has evidence of  avascular necrosis as well as degenrative labral tearing  by imaging studies. This condition presents safety issues increasing the risk of falls.  There is no current active infection.  Patient Active Problem List   Diagnosis Date Noted   Avascular necrosis of hip, left ([place]) 05/13/2021   AVN (avascular necrosis of bone) ([place]) 03/10/2021   Type 2 diabetes mellitus with hyperglycemia ([place]) 05/23/2018   Chronically dry eyes, bilateral 05/23/2018   Migraines 11/28/2016   Hypertension associated with diabetes ([place]) 10/11/2011   History of basal cell carcinoma ([place]), right ear 03/03/2009   Hyperlipidemia associated with type 2 diabetes mellitus ([place]) 01/11/2008   Allergic rhinitis 01/07/2008   Prostatitis, recurrent 01/07/2008   History of gastric ulcer 01/07/2008   History of colon polyps, followed by Dr. Earlean Shawl 01/07/2008   GERD 04/02/2007   Sleep apnea 04/02/2007   Past Medical History:  Diagnosis Date   Arthritis    CAD (coronary artery disease) 10/10/2011   NSTEMI with DES to the proximal RCA following flow wire evaluation. EF is normal.    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   CORONARY ANGIOPLASTY WITH STENT PLACEMENT  10/13/2011   DES to the proximal RCA. Dr Martinique   FRACTIONAL FLOW RESERVE WIRE Right 10/13/2011   Procedure: [place];  Surgeon: Peter M Martinique, MD;  Location: [facility] CATH LAB;  Service: Cardiovascular;  Laterality: Right;   HERNIA REPAIR     INGUINAL HERNIA REPAIR  10/23/2012   Procedure: HERNIA REPAIR INGUINAL ADULT;  Surgeon: Odis Hollingshead, MD;  Location: [place];  Service: General;  Laterality: Left;  Left lower quadrant; left inguinal hernia repair with mesh   INSERTION OF MESH  10/23/2012   Procedure: INSERTION OF MESH;  Surgeon: Odis Hollingshead, MD;  Location: [place];  Service: General;  Laterality: Left;  Left lower quadrant   LEFT HEART CATHETERIZATION WITH CORONARY ANGIOGRAM N/A 10/13/2011   Procedure: LEFT HEART CATHETERIZATION WITH CORONARY ANGIOGRAM;  Surgeon: Peter M Martinique, MD;  Location[facility] CATH LAB;  Service: Cardiovascular;  Laterality: N/A;   PERCUTANEOUS CORONARY STENT INTERVENTION (PCI-S) Right 10/13/2011   Procedure: PERCUTANEOUS CORONARY STENT INTERVENTION (PCI-S);  Surgeon: Peter M Martinique, MD;  Location: [facility] CATH LAB;  Service: Cardiovascular;  Laterality: Right;   SHOULDER ARTHROSCOPY W/ ROTATOR CUFF REPAIR Left 1990's   TRIGGER FINGER RELEASE Left    3rd and 4th digits    No current facility-administered  medications for this encounter.   Current Outpatient Medications  Medication Sig Dispense Refill Last Dose   Ascorbic Acid (VITAMIN C) 1000 MG tablet Take 1,000 mg by mouth daily.      aspirin EC 81 MG tablet Take 81 mg by mouth daily.      azelastine (ASTELIN) 0.1 % nasal spray Place 1 spray into both nostrils 2 (two) times daily. Use in each nostril as directed      B Complex-C (SUPER B COMPLEX PO) Take 1 tablet by mouth daily.      brimonidine (ALPHAGAN) 0.2 % ophthalmic solution Place 1 drop into the right eye in the morning and at bedtime.      dicyclomine (BENTYL) 10 MG capsule TAKE 1 CAPSULE (10 MG TOTAL) BY MOUTH 4 (FOUR) TIMES DAILY - BEFORE MEALS AND AT BEDTIME. (Patient taking differently: Take 10 mg by mouth daily as needed for spasms.) 120 capsule 1    ezetimibe (ZETIA) 10 MG tablet Take 5 mg by mouth daily.      fluticasone (FLONASE) 50 MCG/ACT nasal spray Place 1 spray into both nostrils daily as needed for allergies.      latanoprost (XALATAN) 0.005 % ophthalmic solution Place 1 drop into the right eye at bedtime.      metoprolol tartrate (LOPRESSOR) 50 MG tablet Take 25 mg by mouth daily.      Multiple Vitamins-Minerals (MULTIVITAMINS THER. W/MINERALS) TABS Take 1 tablet by mouth daily.      nitroGLYCERIN (NITROSTAT) 0.4 MG SL tablet Place 1 tablet (0.4 mg total) under the tongue every 5 (five) minutes as needed for chest pain. MAX 3 doses 25 tablet 3    pantoprazole (PROTONIX) 40 MG tablet Take 1 tablet (40 mg total) by mouth daily. 90 tablet 3    Propylene Glycol (SYSTANE BALANCE) 0.6 % SOLN Place 1 drop into both eyes 3 (three) times daily as needed (dry eyes).      rosuvastatin (CRESTOR) 40 MG tablet Take 1 tablet (40 mg total) by mouth daily. 90 tablet 3    traMADol (ULTRAM) 50 MG tablet Take 50 mg by mouth every 6 (six) hours as needed for moderate pain.      vitamin E 180 MG (400 UNITS) capsule Take 400 Units by mouth daily.      diclofenac sodium (VOLTAREN) 1 % GEL Apply  4 g topically 4 (four) times daily. (Patient not taking: No sig reported) 100 g 1 Not Taking   Allergies  Allergen Reactions   Sumatriptan Shortness Of Breath    REACTION: sob, headache scalp sensitivity   Penicillins Itching   Oxycodone Itching    Social History   Tobacco Use   Smoking status: Former    Packs/day: 2.00    Years: 20.00    Pack years: 40.00    Types: Cigarettes    Quit date: 11/14/1982    Years since quitting: 38.5   Smokeless tobacco: Never  Substance Use Topics   Alcohol use: Yes    Alcohol/week: 6.0 standard drinks  Types: 6 Cans of beer per week    Comment: ocass.    Family History  Problem Relation Age of Onset   Cancer Mother        lung   Heart disease Father        heart attack    Cancer Father        colon     Review of Systems  Musculoskeletal:  Positive for gait problem.  All other systems reviewed and are negative.  Objective:  Physical Exam Vitals reviewed.  Constitutional:      Appearance: Normal appearance.  HENT:     Head: Normocephalic and atraumatic.  Eyes:     Extraocular Movements: Extraocular movements intact.     Pupils: Pupils are equal, round, and reactive to light.  Cardiovascular:     Rate and Rhythm: Normal rate and regular rhythm.  Pulmonary:     Effort: Pulmonary effort is normal.     Breath sounds: Normal breath sounds.  Abdominal:     Palpations: Abdomen is soft.  Musculoskeletal:     Left hip: Tenderness and bony tenderness present. Decreased range of motion. Decreased strength.  Neurological:     Mental Status: He is alert and oriented to person, place, and time.  Psychiatric:        Behavior: Behavior normal.    Vital signs in last 24 hours:    Labs:   Estimated body mass index is 28.5 kg/m as calculated from the following:   Height as of 05/07/21: 6\' 1"  (1.854 m).   Weight as of 05/07/21: 98 kg.   Imaging Review MRI demonstrates AVN of the left hip.     Assessment/Plan:  Avascular  necrosis, left hip(s)  The patient history, physical examination, clinical judgement of the provider and imaging studies are consistent with AVN of the left hip(s) and total hip arthroplasty is deemed medically necessary. The treatment options including medical management, injection therapy, arthroscopy and arthroplasty were discussed at length. The risks and benefits of total hip arthroplasty were presented and reviewed. The risks due to aseptic loosening, infection, stiffness, dislocation/subluxation,  thromboembolic complications and other imponderables were discussed.  The patient acknowledged the explanation, agreed to proceed with the plan and consent was signed. Patient is being admitted for inpatient treatment for surgery, pain control, PT, OT, prophylactic antibiotics, VTE prophylaxis, progressive ambulation and ADL's and discharge planning.The patient is planning to be discharged home with home health services

## 2021-05-14 ENCOUNTER — Encounter (HOSPITAL_COMMUNITY): Payer: Self-pay | Admitting: Orthopaedic Surgery

## 2021-05-14 ENCOUNTER — Ambulatory Visit (HOSPITAL_COMMUNITY): Payer: Medicare HMO

## 2021-05-14 ENCOUNTER — Observation Stay (HOSPITAL_COMMUNITY)
Admission: RE | Admit: 2021-05-14 | Discharge: 2021-05-15 | Disposition: A | Discharge status: Home / Self Care | Payer: Medicare HMO | Source: Other Acute Inpatient Hospital | Attending: Orthopaedic Surgery | Admitting: Orthopaedic Surgery

## 2021-05-14 ENCOUNTER — Encounter (HOSPITAL_COMMUNITY)
Admission: RE | Disposition: A | Discharge status: Home / Self Care | Payer: Self-pay | Source: Other Acute Inpatient Hospital | Attending: Orthopaedic Surgery

## 2021-05-14 ENCOUNTER — Other Ambulatory Visit: Payer: Self-pay

## 2021-05-14 ENCOUNTER — Ambulatory Visit (HOSPITAL_COMMUNITY): Payer: Medicare HMO | Admitting: Physician Assistant

## 2021-05-14 ENCOUNTER — Ambulatory Visit (HOSPITAL_COMMUNITY): Payer: Medicare HMO | Admitting: Anesthesiology

## 2021-05-14 ENCOUNTER — Observation Stay (HOSPITAL_COMMUNITY): Payer: Medicare HMO

## 2021-05-14 DIAGNOSIS — Z955 Presence of coronary angioplasty implant and graft: Secondary | ICD-10-CM | POA: Insufficient documentation

## 2021-05-14 DIAGNOSIS — E119 Type 2 diabetes mellitus without complications: Secondary | ICD-10-CM | POA: Diagnosis not present

## 2021-05-14 DIAGNOSIS — Z85828 Personal history of other malignant neoplasm of skin: Secondary | ICD-10-CM | POA: Diagnosis not present

## 2021-05-14 DIAGNOSIS — M87052 Idiopathic aseptic necrosis of left femur: Secondary | ICD-10-CM | POA: Diagnosis not present

## 2021-05-14 DIAGNOSIS — I2510 Atherosclerotic heart disease of native coronary artery without angina pectoris: Secondary | ICD-10-CM | POA: Insufficient documentation

## 2021-05-14 DIAGNOSIS — Z7982 Long term (current) use of aspirin: Secondary | ICD-10-CM | POA: Diagnosis not present

## 2021-05-14 DIAGNOSIS — M1612 Unilateral primary osteoarthritis, left hip: Secondary | ICD-10-CM | POA: Diagnosis not present

## 2021-05-14 DIAGNOSIS — G43909 Migraine, unspecified, not intractable, without status migrainosus: Secondary | ICD-10-CM | POA: Diagnosis not present

## 2021-05-14 DIAGNOSIS — K219 Gastro-esophageal reflux disease without esophagitis: Secondary | ICD-10-CM | POA: Diagnosis not present

## 2021-05-14 DIAGNOSIS — Z471 Aftercare following joint replacement surgery: Secondary | ICD-10-CM | POA: Diagnosis not present

## 2021-05-14 DIAGNOSIS — Z96642 Presence of left artificial hip joint: Secondary | ICD-10-CM | POA: Diagnosis not present

## 2021-05-14 DIAGNOSIS — E1165 Type 2 diabetes mellitus with hyperglycemia: Secondary | ICD-10-CM | POA: Diagnosis not present

## 2021-05-14 DIAGNOSIS — I10 Essential (primary) hypertension: Secondary | ICD-10-CM | POA: Insufficient documentation

## 2021-05-14 DIAGNOSIS — Z79899 Other long term (current) drug therapy: Secondary | ICD-10-CM | POA: Insufficient documentation

## 2021-05-14 DIAGNOSIS — S73192A Other sprain of left hip, initial encounter: Secondary | ICD-10-CM | POA: Diagnosis not present

## 2021-05-14 DIAGNOSIS — X58XXXA Exposure to other specified factors, initial encounter: Secondary | ICD-10-CM | POA: Insufficient documentation

## 2021-05-14 DIAGNOSIS — Z419 Encounter for procedure for purposes other than remedying health state, unspecified: Secondary | ICD-10-CM

## 2021-05-14 DIAGNOSIS — Z87891 Personal history of nicotine dependence: Secondary | ICD-10-CM | POA: Diagnosis not present

## 2021-05-14 HISTORY — PX: TOTAL HIP ARTHROPLASTY: SHX124

## 2021-05-14 LAB — TYPE AND SCREEN
ABO/RH(D): O POS
Antibody Screen: NEGATIVE

## 2021-05-14 LAB — GLUCOSE, CAPILLARY: Glucose-Capillary: 135 mg/dL — ABNORMAL HIGH (ref 70–99)

## 2021-05-14 LAB — ABO/RH: ABO/RH(D): O POS

## 2021-05-14 SURGERY — ARTHROPLASTY, HIP, TOTAL, ANTERIOR APPROACH
Anesthesia: General | Site: Hip | Laterality: Left

## 2021-05-14 MED ORDER — EPHEDRINE 5 MG/ML INJ
INTRAVENOUS | Status: AC
  Filled 2021-05-14: qty 10

## 2021-05-14 MED ORDER — PHENOL 1.4 % MT LIQD: 1.0000 | OROMUCOSAL | Status: DC | PRN

## 2021-05-14 MED ORDER — FENTANYL CITRATE (PF) 100 MCG/2ML IJ SOLN
INTRAMUSCULAR | Status: AC
  Filled 2021-05-14: qty 2

## 2021-05-14 MED ORDER — METOCLOPRAMIDE HCL 5 MG PO TABS: 5.0000 mg | ORAL_TABLET | Freq: Three times a day (TID) | ORAL | Status: DC | PRN

## 2021-05-14 MED ORDER — METOCLOPRAMIDE HCL 5 MG/ML IJ SOLN: 5.0000 mg | Freq: Three times a day (TID) | INTRAMUSCULAR | Status: DC | PRN

## 2021-05-14 MED ORDER — ONDANSETRON HCL 4 MG/2ML IJ SOLN
INTRAMUSCULAR | Status: AC
  Filled 2021-05-14: qty 2

## 2021-05-14 MED ORDER — METHOCARBAMOL 500 MG PO TABS
500.0000 mg | ORAL_TABLET | Freq: Four times a day (QID) | ORAL | Status: DC | PRN
  Administered 2021-05-14 – 2021-05-15 (×2): 500 mg via ORAL
  Filled 2021-05-14 (×2): qty 1

## 2021-05-14 MED ORDER — ROSUVASTATIN CALCIUM 20 MG PO TABS
40.0000 mg | ORAL_TABLET | Freq: Every day | ORAL | Status: DC
  Administered 2021-05-14 – 2021-05-15 (×2): 40 mg via ORAL
  Filled 2021-05-14 (×2): qty 2

## 2021-05-14 MED ORDER — SODIUM CHLORIDE 0.9 % IR SOLN
Status: DC | PRN
  Administered 2021-05-14: 1000 mL

## 2021-05-14 MED ORDER — OXYCODONE HCL 5 MG/5ML PO SOLN: 5.0000 mg | Freq: Once | ORAL | Status: DC | PRN

## 2021-05-14 MED ORDER — POLYETHYLENE GLYCOL 3350 17 G PO PACK: 17.0000 g | PACK | Freq: Every day | ORAL | Status: DC | PRN

## 2021-05-14 MED ORDER — POVIDONE-IODINE 10 % EX SWAB
2.0000 "application " | Freq: Once | CUTANEOUS | Status: AC
  Administered 2021-05-14: 2 via TOPICAL

## 2021-05-14 MED ORDER — MIDAZOLAM HCL 2 MG/2ML IJ SOLN
INTRAMUSCULAR | Status: AC
  Filled 2021-05-14: qty 2

## 2021-05-14 MED ORDER — ONDANSETRON HCL 4 MG/2ML IJ SOLN
INTRAMUSCULAR | Status: DC | PRN
  Administered 2021-05-14: 4 mg via INTRAVENOUS

## 2021-05-14 MED ORDER — OXYCODONE HCL 5 MG PO TABS: 5.0000 mg | ORAL_TABLET | ORAL | Status: DC | PRN

## 2021-05-14 MED ORDER — AMISULPRIDE (ANTIEMETIC) 5 MG/2ML IV SOLN: 10.0000 mg | Freq: Once | INTRAVENOUS | Status: DC | PRN

## 2021-05-14 MED ORDER — METHOCARBAMOL 500 MG IVPB - SIMPLE MED
INTRAVENOUS | Status: AC
  Filled 2021-05-14: qty 50

## 2021-05-14 MED ORDER — HYDROMORPHONE HCL 1 MG/ML IJ SOLN
0.5000 mg | INTRAMUSCULAR | Status: DC | PRN
  Administered 2021-05-14 – 2021-05-15 (×2): 1 mg via INTRAVENOUS
  Filled 2021-05-14 (×2): qty 1

## 2021-05-14 MED ORDER — ROCURONIUM BROMIDE 10 MG/ML (PF) SYRINGE
PREFILLED_SYRINGE | INTRAVENOUS | Status: AC
  Filled 2021-05-14: qty 10

## 2021-05-14 MED ORDER — DIPHENHYDRAMINE HCL 12.5 MG/5ML PO ELIX
12.5000 mg | ORAL_SOLUTION | ORAL | Status: DC | PRN
Start: 2021-05-14 — End: 2021-05-15
  Administered 2021-05-14: 12.5 mg via ORAL
  Administered 2021-05-15: 25 mg via ORAL
  Filled 2021-05-14: qty 10
  Filled 2021-05-14: qty 5

## 2021-05-14 MED ORDER — LIDOCAINE 2% (20 MG/ML) 5 ML SYRINGE
INTRAMUSCULAR | Status: AC
  Filled 2021-05-14: qty 5

## 2021-05-14 MED ORDER — FENTANYL CITRATE (PF) 100 MCG/2ML IJ SOLN
INTRAMUSCULAR | Status: AC
  Administered 2021-05-14: 50 ug via INTRAVENOUS
  Filled 2021-05-14: qty 2

## 2021-05-14 MED ORDER — ROCURONIUM BROMIDE 10 MG/ML (PF) SYRINGE
PREFILLED_SYRINGE | INTRAVENOUS | Status: DC | PRN
  Administered 2021-05-14: 70 mg via INTRAVENOUS

## 2021-05-14 MED ORDER — ASPIRIN 81 MG PO CHEW
81.0000 mg | CHEWABLE_TABLET | Freq: Two times a day (BID) | ORAL | Status: DC
  Administered 2021-05-14 – 2021-05-15 (×2): 81 mg via ORAL
  Filled 2021-05-14 (×2): qty 1

## 2021-05-14 MED ORDER — ONDANSETRON HCL 4 MG/2ML IJ SOLN
4.0000 mg | Freq: Four times a day (QID) | INTRAMUSCULAR | Status: DC | PRN
  Administered 2021-05-15: 4 mg via INTRAVENOUS
  Filled 2021-05-14: qty 2

## 2021-05-14 MED ORDER — HYDROMORPHONE HCL 1 MG/ML IJ SOLN
INTRAMUSCULAR | Status: AC
  Filled 2021-05-14: qty 1

## 2021-05-14 MED ORDER — PROPOFOL 10 MG/ML IV BOLUS
INTRAVENOUS | Status: DC | PRN
  Administered 2021-05-14: 140 mg via INTRAVENOUS

## 2021-05-14 MED ORDER — VITAMIN E 45 MG (100 UNIT) PO CAPS
400.0000 [IU] | ORAL_CAPSULE | Freq: Every day | ORAL | Status: DC
  Administered 2021-05-15: 400 [IU] via ORAL
  Filled 2021-05-14: qty 4

## 2021-05-14 MED ORDER — TRANEXAMIC ACID-NACL 1000-0.7 MG/100ML-% IV SOLN
1000.0000 mg | INTRAVENOUS | Status: AC
  Administered 2021-05-14: 1000 mg via INTRAVENOUS
  Filled 2021-05-14: qty 100

## 2021-05-14 MED ORDER — METOPROLOL TARTRATE 25 MG PO TABS
25.0000 mg | ORAL_TABLET | Freq: Every day | ORAL | Status: DC
  Administered 2021-05-15: 25 mg via ORAL
  Filled 2021-05-14: qty 1

## 2021-05-14 MED ORDER — DEXAMETHASONE SODIUM PHOSPHATE 10 MG/ML IJ SOLN
INTRAMUSCULAR | Status: AC
  Filled 2021-05-14: qty 1

## 2021-05-14 MED ORDER — ONDANSETRON HCL 4 MG/2ML IJ SOLN: 4.0000 mg | Freq: Once | INTRAMUSCULAR | Status: DC | PRN

## 2021-05-14 MED ORDER — PROPOFOL 10 MG/ML IV BOLUS
INTRAVENOUS | Status: AC
  Filled 2021-05-14: qty 20

## 2021-05-14 MED ORDER — MIDAZOLAM HCL 2 MG/2ML IJ SOLN
INTRAMUSCULAR | Status: DC | PRN
  Administered 2021-05-14: 2 mg via INTRAVENOUS

## 2021-05-14 MED ORDER — LATANOPROST 0.005 % OP SOLN
1.0000 [drp] | Freq: Every day | OPHTHALMIC | Status: DC
  Administered 2021-05-14: 1 [drp] via OPHTHALMIC
  Filled 2021-05-14: qty 2.5

## 2021-05-14 MED ORDER — SUCCINYLCHOLINE CHLORIDE 200 MG/10ML IV SOSY
PREFILLED_SYRINGE | INTRAVENOUS | Status: AC
  Filled 2021-05-14: qty 10

## 2021-05-14 MED ORDER — CLINDAMYCIN PHOSPHATE 600 MG/50ML IV SOLN
600.0000 mg | Freq: Four times a day (QID) | INTRAVENOUS | Status: AC
  Administered 2021-05-14 (×2): 600 mg via INTRAVENOUS
  Filled 2021-05-14 (×2): qty 50

## 2021-05-14 MED ORDER — BRIMONIDINE TARTRATE 0.2 % OP SOLN
1.0000 [drp] | Freq: Two times a day (BID) | OPHTHALMIC | Status: DC
  Administered 2021-05-14 – 2021-05-15 (×2): 1 [drp] via OPHTHALMIC
  Filled 2021-05-14: qty 5

## 2021-05-14 MED ORDER — CLINDAMYCIN PHOSPHATE 900 MG/50ML IV SOLN
900.0000 mg | INTRAVENOUS | Status: AC
  Administered 2021-05-14: 900 mg via INTRAVENOUS
  Filled 2021-05-14: qty 50

## 2021-05-14 MED ORDER — HYDROMORPHONE HCL 1 MG/ML IJ SOLN
0.5000 mg | Freq: Once | INTRAMUSCULAR | Status: AC
  Administered 2021-05-14: 0.5 mg via INTRAVENOUS

## 2021-05-14 MED ORDER — PHENYLEPHRINE HCL (PRESSORS) 10 MG/ML IV SOLN
INTRAVENOUS | Status: AC
  Filled 2021-05-14: qty 1

## 2021-05-14 MED ORDER — BISACODYL 10 MG RE SUPP: 10.0000 mg | Freq: Every day | RECTAL | Status: DC | PRN

## 2021-05-14 MED ORDER — ALUM & MAG HYDROXIDE-SIMETH 200-200-20 MG/5ML PO SUSP
30.0000 mL | ORAL | Status: DC | PRN
  Administered 2021-05-14 – 2021-05-15 (×2): 30 mL via ORAL
  Filled 2021-05-14 (×2): qty 30

## 2021-05-14 MED ORDER — ASCORBIC ACID 500 MG PO TABS
1000.0000 mg | ORAL_TABLET | Freq: Every day | ORAL | Status: DC
  Administered 2021-05-15: 1000 mg via ORAL
  Filled 2021-05-14: qty 2

## 2021-05-14 MED ORDER — FENTANYL CITRATE (PF) 100 MCG/2ML IJ SOLN
25.0000 ug | INTRAMUSCULAR | Status: DC | PRN
  Administered 2021-05-14: 50 ug via INTRAVENOUS

## 2021-05-14 MED ORDER — LACTATED RINGERS IV SOLN: INTRAVENOUS | Status: DC

## 2021-05-14 MED ORDER — OXYCODONE HCL 5 MG PO TABS: 5.0000 mg | ORAL_TABLET | Freq: Once | ORAL | Status: DC | PRN

## 2021-05-14 MED ORDER — ACETAMINOPHEN 325 MG PO TABS
325.0000 mg | ORAL_TABLET | Freq: Four times a day (QID) | ORAL | Status: DC | PRN
  Administered 2021-05-14 – 2021-05-15 (×2): 650 mg via ORAL
  Filled 2021-05-14 (×2): qty 2

## 2021-05-14 MED ORDER — LIDOCAINE 2% (20 MG/ML) 5 ML SYRINGE
INTRAMUSCULAR | Status: DC | PRN
  Administered 2021-05-14: 100 mg via INTRAVENOUS

## 2021-05-14 MED ORDER — EPHEDRINE SULFATE-NACL 50-0.9 MG/10ML-% IV SOSY
PREFILLED_SYRINGE | INTRAVENOUS | Status: DC | PRN
  Administered 2021-05-14 (×2): 10 mg via INTRAVENOUS

## 2021-05-14 MED ORDER — FENTANYL CITRATE (PF) 100 MCG/2ML IJ SOLN
INTRAMUSCULAR | Status: DC | PRN
  Administered 2021-05-14: 50 ug via INTRAVENOUS
  Administered 2021-05-14: 100 ug via INTRAVENOUS
  Administered 2021-05-14 (×3): 50 ug via INTRAVENOUS

## 2021-05-14 MED ORDER — EZETIMIBE 10 MG PO TABS
5.0000 mg | ORAL_TABLET | Freq: Every day | ORAL | Status: DC
  Administered 2021-05-14 – 2021-05-15 (×2): 5 mg via ORAL
  Filled 2021-05-14 (×2): qty 1

## 2021-05-14 MED ORDER — AZELASTINE HCL 0.1 % NA SOLN
1.0000 | Freq: Two times a day (BID) | NASAL | Status: DC
  Administered 2021-05-15: 1 via NASAL
  Filled 2021-05-14: qty 30

## 2021-05-14 MED ORDER — PANTOPRAZOLE SODIUM 40 MG PO TBEC: 40.0000 mg | DELAYED_RELEASE_TABLET | Freq: Every day | ORAL | Status: DC

## 2021-05-14 MED ORDER — 0.9 % SODIUM CHLORIDE (POUR BTL) OPTIME
TOPICAL | Status: DC | PRN
  Administered 2021-05-14: 1000 mL

## 2021-05-14 MED ORDER — PANTOPRAZOLE SODIUM 40 MG PO TBEC
40.0000 mg | DELAYED_RELEASE_TABLET | Freq: Every day | ORAL | Status: DC
  Administered 2021-05-15: 40 mg via ORAL
  Filled 2021-05-14: qty 1

## 2021-05-14 MED ORDER — MENTHOL 3 MG MT LOZG: 1.0000 | LOZENGE | OROMUCOSAL | Status: DC | PRN

## 2021-05-14 MED ORDER — SODIUM CHLORIDE 0.9 % IV SOLN: INTRAVENOUS | Status: DC

## 2021-05-14 MED ORDER — ACETAMINOPHEN 10 MG/ML IV SOLN
INTRAVENOUS | Status: AC
  Filled 2021-05-14: qty 100

## 2021-05-14 MED ORDER — OXYCODONE HCL 5 MG PO TABS
10.0000 mg | ORAL_TABLET | ORAL | Status: DC | PRN
  Administered 2021-05-14: 15 mg via ORAL
  Administered 2021-05-14: 10 mg via ORAL
  Administered 2021-05-14 – 2021-05-15 (×3): 15 mg via ORAL
  Filled 2021-05-14: qty 2
  Filled 2021-05-14 (×4): qty 3

## 2021-05-14 MED ORDER — METHOCARBAMOL 500 MG IVPB - SIMPLE MED
500.0000 mg | Freq: Four times a day (QID) | INTRAVENOUS | Status: DC | PRN
  Filled 2021-05-14: qty 50

## 2021-05-14 MED ORDER — SUGAMMADEX SODIUM 200 MG/2ML IV SOLN
INTRAVENOUS | Status: DC | PRN
  Administered 2021-05-14: 200 mg via INTRAVENOUS

## 2021-05-14 MED ORDER — DOCUSATE SODIUM 100 MG PO CAPS
100.0000 mg | ORAL_CAPSULE | Freq: Two times a day (BID) | ORAL | Status: DC
  Administered 2021-05-14 – 2021-05-15 (×2): 100 mg via ORAL
  Filled 2021-05-14 (×2): qty 1

## 2021-05-14 MED ORDER — ONDANSETRON HCL 4 MG PO TABS: 4.0000 mg | ORAL_TABLET | Freq: Four times a day (QID) | ORAL | Status: DC | PRN

## 2021-05-14 MED ORDER — ACETAMINOPHEN 10 MG/ML IV SOLN
1000.0000 mg | Freq: Once | INTRAVENOUS | Status: AC
  Administered 2021-05-14: 1000 mg via INTRAVENOUS

## 2021-05-14 MED ORDER — CHLORHEXIDINE GLUCONATE 0.12 % MT SOLN
15.0000 mL | Freq: Once | OROMUCOSAL | Status: AC
  Administered 2021-05-14: 15 mL via OROMUCOSAL

## 2021-05-14 MED ORDER — DEXAMETHASONE SODIUM PHOSPHATE 10 MG/ML IJ SOLN
INTRAMUSCULAR | Status: DC | PRN
  Administered 2021-05-14: 10 mg via INTRAVENOUS

## 2021-05-14 MED ORDER — ORAL CARE MOUTH RINSE: 15.0000 mL | Freq: Once | OROMUCOSAL | Status: AC

## 2021-05-14 MED ORDER — PROPOFOL 500 MG/50ML IV EMUL
INTRAVENOUS | Status: AC
  Filled 2021-05-14: qty 50

## 2021-05-14 SURGICAL SUPPLY — 41 items
ARTICULEZE HEAD (Hips) ×2 IMPLANT
BAG COUNTER SPONGE SURGICOUNT (BAG) ×2 IMPLANT
BAG ZIPLOCK 12X15 (MISCELLANEOUS) IMPLANT
BENZOIN TINCTURE PRP APPL 2/3 (GAUZE/BANDAGES/DRESSINGS) IMPLANT
BLADE SAW SGTL 18X1.27X75 (BLADE) ×2 IMPLANT
COLLAR OFFSET CORAIL SZ 15 HIP (Stem) ×1 IMPLANT
CORAIL OFFSET COLLAR SZ 15 HIP (Stem) ×2 IMPLANT
COVER PERINEAL POST (MISCELLANEOUS) ×2 IMPLANT
COVER SURGICAL LIGHT HANDLE (MISCELLANEOUS) ×2 IMPLANT
CUP SECTOR GRIPTON 58MM (Orthopedic Implant) ×2 IMPLANT
DRAPE FOOT SWITCH (DRAPES) ×2 IMPLANT
DRAPE STERI IOBAN 125X83 (DRAPES) ×2 IMPLANT
DRAPE U-SHAPE 47X51 STRL (DRAPES) ×4 IMPLANT
DRSG AQUACEL AG ADV 3.5X10 (GAUZE/BANDAGES/DRESSINGS) ×2 IMPLANT
DURAPREP 26ML APPLICATOR (WOUND CARE) ×2 IMPLANT
ELECT REM PT RETURN 15FT ADLT (MISCELLANEOUS) ×2 IMPLANT
GAUZE XEROFORM 1X8 LF (GAUZE/BANDAGES/DRESSINGS) ×2 IMPLANT
GLOVE SRG 8 PF TXTR STRL LF DI (GLOVE) ×2 IMPLANT
GLOVE SURG ENC MOIS LTX SZ7.5 (GLOVE) ×2 IMPLANT
GLOVE SURG LTX SZ8 (GLOVE) ×2 IMPLANT
GLOVE SURG UNDER POLY LF SZ8 (GLOVE) ×4
GOWN STRL REUS W/TWL XL LVL3 (GOWN DISPOSABLE) ×4 IMPLANT
HANDPIECE INTERPULSE COAX TIP (DISPOSABLE) ×2
HEAD ARTICULEZE (Hips) ×1 IMPLANT
HOLDER FOLEY CATH W/STRAP (MISCELLANEOUS) IMPLANT
KIT TURNOVER KIT A (KITS) ×2 IMPLANT
LINER NEUTRAL 36X58 PLUS4 ×2 IMPLANT
PACK ANTERIOR HIP CUSTOM (KITS) ×2 IMPLANT
PENCIL SMOKE EVACUATOR (MISCELLANEOUS) IMPLANT
SET HNDPC FAN SPRY TIP SCT (DISPOSABLE) ×1 IMPLANT
SPONGE T-LAP 18X18 ~~LOC~~+RFID (SPONGE) ×6 IMPLANT
STAPLER VISISTAT 35W (STAPLE) ×2 IMPLANT
STRIP CLOSURE SKIN 1/2X4 (GAUZE/BANDAGES/DRESSINGS) IMPLANT
SUT ETHIBOND NAB CT1 #1 30IN (SUTURE) ×2 IMPLANT
SUT ETHILON 2 0 PS N (SUTURE) IMPLANT
SUT MNCRL AB 4-0 PS2 18 (SUTURE) IMPLANT
SUT VIC AB 0 CT1 36 (SUTURE) ×2 IMPLANT
SUT VIC AB 1 CT1 36 (SUTURE) ×2 IMPLANT
SUT VIC AB 2-0 CT1 27 (SUTURE) ×4
SUT VIC AB 2-0 CT1 TAPERPNT 27 (SUTURE) ×2 IMPLANT
TRAY FOLEY MTR SLVR 16FR STAT (SET/KITS/TRAYS/PACK) IMPLANT

## 2021-05-14 NOTE — Transfer of Care (Signed)
Immediate Anesthesia Transfer of Care Note  Patient: Robert [name]  Procedure(s) Performed: LEFT TOTAL HIP ARTHROPLASTY ANTERIOR APPROACH (Left: Hip)  Patient Location: PACU  Anesthesia Type:General  Level of Consciousness: awake, alert  and oriented  Airway & Oxygen Therapy: Patient Spontanous Breathing and Patient connected to face mask oxygen  Post-op Assessment: Report given to RN and Post -op Vital signs reviewed and stable  Post vital signs: Reviewed and stable  Last Vitals:  Vitals Value Taken Time  BP    Temp    Pulse 61 05/14/21 1059  Resp 14 05/14/21 1059  SpO2 100 % 05/14/21 1059  Vitals shown include unvalidated device data.  Last Pain:  Vitals:   05/14/21 0710  TempSrc: Oral  PainSc:       Patients Stated Pain Goal: 3 (94/58/59 2924)  Complications: No notable events documented.

## 2021-05-14 NOTE — Brief Op Note (Signed)
05/14/2021  10:30 AM  PATIENT:  Robert [name]  75 y.o. male  PRE-OPERATIVE DIAGNOSIS:  Osteoarthritis Left Hip  POST-OPERATIVE DIAGNOSIS:  Osteoarthritis Left Hip  PROCEDURE:  Procedure(s): LEFT TOTAL HIP ARTHROPLASTY ANTERIOR APPROACH (Left)  SURGEON:  Surgeon(s) and Role:    Mcarthur Rossetti, MD - Primary  PHYSICIAN ASSISTANT: Benita Stabile, PA-C  ANESTHESIA:   general  EBL:  450 mL   COUNTS:  YES  TOURNIQUET:  * No tourniquets in log *  DICTATION: .Other Dictation: Dictation Number [id number]  PLAN OF CARE: Admit for overnight observation  PATIENT DISPOSITION:  PACU - hemodynamically stable.   Delay start of Pharmacological VTE agent (>24hrs) due to surgical blood loss or risk of bleeding: no

## 2021-05-14 NOTE — Anesthesia Procedure Notes (Signed)
Procedure Name: Intubation Date/Time: 05/14/2021 9:19 AM Performed by: Sharlette Dense, CRNA Pre-anesthesia Checklist: Patient identified, Emergency Drugs available, Suction available and Patient being monitored Patient Re-evaluated:Patient Re-evaluated prior to induction Oxygen Delivery Method: Circle system utilized Preoxygenation: Pre-oxygenation with 100% oxygen Induction Type: IV induction Ventilation: Mask ventilation without difficulty and Oral airway inserted - appropriate to patient size Laryngoscope Size: Sabra Heck and 3 Grade View: Grade I Tube type: Oral Tube size: 8.0 mm Number of attempts: 1 Airway Equipment and Method: Stylet and Oral airway Placement Confirmation: ETT inserted through vocal cords under direct vision, positive ETCO2 and breath sounds checked- equal and bilateral Secured at: 23 cm Tube secured with: Tape Dental Injury: Teeth and Oropharynx as per pre-operative assessment

## 2021-05-14 NOTE — Interval H&P Note (Signed)
History and Physical Interval Note: The patient understands that he is here today for a left total hip replacement to treat his left hip osteoarthritis.  There has been no acute or interval change in his medical status.  Please see recent H&P.  The risks and benefits of surgery been explained in detail and informed consent is obtained.  The left hip has been marked.  05/14/2021 7:02 AM  Robert [name]  has presented today for surgery, with the diagnosis of Osteoarthritis Left Hip.  The various methods of treatment have been discussed with the patient and family. After consideration of risks, benefits and other options for treatment, the patient has consented to  Procedure(s): LEFT TOTAL HIP ARTHROPLASTY ANTERIOR APPROACH (Left) as a surgical intervention.  The patient's history has been reviewed, patient examined, no change in status, stable for surgery.  I have reviewed the patient's chart and labs.  Questions were answered to the patient's satisfaction.     Mcarthur Rossetti

## 2021-05-14 NOTE — Op Note (Signed)
NAME: Robert [name], MURCH MEDICAL RECORD NO: [id number] ACCOUNT NO: [id number] DATE OF BIRTH: May 25, 1946 FACILITY: [facility] LOCATION: [facility] PHYSICIAN: Lind Guest. Ninfa Linden, MD  Operative Report   DATE OF PROCEDURE: 05/14/2021  PREOPERATIVE DIAGNOSIS:  Primary arthritis with superimposed avascular necrosis and degenerative labral tearing, left hip.  POSTOPERATIVE DIAGNOSIS:  Primary arthritis with superimposed avascular necrosis and degenerative labral tearing, left hip.  PROCEDURE:  Left total hip arthroplasty through direct anterior approach.  IMPLANTS:  DePuy sector Gription acetabular component, size 58, size 36+4 neutral polyethylene liner, size 15 Corail femoral component with high offset, size 36+5 metal hip ball.  SURGEON:  Lind Guest. Ninfa Linden, MD  ASSISTANT:  Erskine Emery, PA-C.  ANESTHESIA:  General.  ANTIBIOTICS:  900 mg IV clindamycin.  ESTIMATED BLOOD LOSS:  742 mL  COMPLICATIONS:  None.  INDICATIONS:  The patient is a very active and young appearing 75 year old gentleman with debilitating issues involving his left hip.  His plain films do not show significant arthritic changes.  An MRI was obtained, which showed evidence of avascular  necrosis in that hip as well as tearing of the labrum.  His pain is definitely focused on his left hip and at this point is detrimentally affecting his mobility, his quality of life and activities of daily living to the point he does wish to proceed with  a total hip arthroplasty of left hip.  We have recommended that as well.  With this type of surgery he understands there is risk of acute blood loss anemia, nerve or vessel injury, fracture, infection, DVT, implant failure and skin and soft tissue  issues.  He understands our goals are to decrease pain, improve mobility and overall improve quality of life.  DESCRIPTION OF PROCEDURE:  After informed consent was obtained, appropriate left hip was marked.  He was brought to the operating  room and general anesthesia was obtained while he was on stretcher.  I assessed his leg lengths and they are equal, though  radiographically it looks like a short on right side.  They actually equal clinically leg lengths.  So we are going to definitely fact that into our surgery radiographically.  Traction boots were placed on both his feet.  Next, he was placed supine on  the Hana fracture table with a perineal post in place and both legs in in-line skeletal traction device and no traction applied.  His left operative hip was prepped and draped with DuraPrep and sterile drapes.  A timeout was called and he was identified  correct patient, correct left hip.  I then made an incision just inferior and posterior to the anterior superior iliac spine and carried this obliquely down the leg.  We dissected down tensor fascia lata muscle.  Tensor fascia was then divided  longitudinally to proceed with direct anterior approach to the hip.  We identified and cauterized circumflex vessels, we then identified the hip capsule, we opened the hip capsule in L-type format finding moderate joint effusion.  We placed Cobra  retractors within the joint capsule around the medial and lateral femoral neck and made our femoral neck cut with the oscillating saw and completed with an osteotome.  We placed a corkscrew guide in the femoral head in place and removed the femoral head  in its entirety and found to have evidence of avascular necrosis and labral tearing.  We placed a bent Hohmann over the medial acetabular rim and removed remnants of acetabular labrum and other debris.  We then began reaming in a  stepwise increments  going from size 43 all the way up to a size 57 with all reamers placed under direct visualization.  Last reamer placed under direct fluoroscopy, so we could obtain our depth in reaming, our inclination and anteversion.  I then placed a real DePuy sector  Gription acetabular component, size 57 with a 36+4  neutral polyethylene liner based off his high offset.  Attention was then turned to the femur with the leg externally rotated 120 degrees, extended and adduct.  We are able to place a Mueller retractor  medially and Hohman retractor behind the greater trochanter, we released lateral joint capsule and used a box cutting osteotome to enter the femoral canal and a rongeur to lateralize, we then began broaching using the Corail broaching system from a size  8 going up to a size 15, with a size 15 in place a trial standard offset femoral neck and a 36+15 hip ball, reduced this in acetabulum and we felt like we needed just a little bit more offset and leg length.  It was stable though.  We dislocated the hip,  removed the trial components.  We placed the real Corail femoral component with high offset size 15 and we went with the 36+5 metal hip ball again reduced this in acetabulum.  We definitely equaled our leg length on preoperative and intraoperative  x-rays at the beginning and end of this case.  He was stable radiographically and mechanically stressing the hip.  We then irrigated the soft tissue with normal saline solution using pulsatile lavage.  We reapproximated some of the joint capsule with  interrupted #1 Ethibond suture and #1 Vicryl was used to close the tensor fascia.  0 Vicryl was used to close deep tissue and 2-0 Vicryl was used to close subcutaneous tissue.  The skin was closed with staples.  Xeroform well-padded sterile dressing was  applied.  He was taken to recovery room in stable condition with all final counts being correct.  No complications noted.  Of note, Benita Stabile, PA-C did assist during the entire case.  His assistance was crucial for facilitating all aspects of this case.   PUS D: 05/14/2021 10:28:57 am T: 05/14/2021 1:18:00 pm  JOB: [id number]/ [id number]

## 2021-05-14 NOTE — Anesthesia Postprocedure Evaluation (Signed)
Anesthesia Post Note  Patient: Robert [name]  Procedure(s) Performed: LEFT TOTAL HIP ARTHROPLASTY ANTERIOR APPROACH (Left: Hip)     Patient location during evaluation: PACU Anesthesia Type: General Level of consciousness: awake and alert Pain management: pain level controlled Vital Signs Assessment: post-procedure vital signs reviewed and stable Respiratory status: spontaneous breathing, nonlabored ventilation and respiratory function stable Cardiovascular status: blood pressure returned to baseline and stable Postop Assessment: no apparent nausea or vomiting Anesthetic complications: no   No notable events documented.  Last Vitals:  Vitals:   05/14/21 1320 05/14/21 1330  BP:  (!) 148/73  Pulse: (!) 57 64  Resp: 13 16  Temp:  36.8 C  SpO2: 100% 100%    Last Pain:  Vitals:   05/14/21 1355  TempSrc:   PainSc: 6                  Lidia Collum

## 2021-05-14 NOTE — Progress Notes (Signed)
RT went to inquire patient about the cpap. Pt refused cpap and said he would be fine without it. CPAP is standby in the room. He said he doesn't use his cpap at home on regular basis. Advised pt to let the RN know if he changes his mind and I will come back and set it up for him. Pt understood.

## 2021-05-14 NOTE — Anesthesia Preprocedure Evaluation (Addendum)
Anesthesia Evaluation  Patient identified by MRN, date of birth, ID band Patient awake    Reviewed: Allergy & Precautions, NPO status , Patient's Chart, lab work & pertinent test results, reviewed documented beta blocker date and time   History of Anesthesia Complications Negative for: history of anesthetic complications  Airway Mallampati: II  TM Distance: >3 FB Neck ROM: Full    Dental  (+) Missing, Dental Advisory Given   Pulmonary sleep apnea and Continuous Positive Airway Pressure Ventilation , former smoker,    Pulmonary exam normal        Cardiovascular hypertension, Pt. on medications and Pt. on home beta blockers + CAD, + Past MI and + Cardiac Stents (2012)  Normal cardiovascular exam     Neuro/Psych  Headaches,    GI/Hepatic Neg liver ROS, PUD, GERD  ,  Endo/Other  diabetes  Renal/GU negative Renal ROS  negative genitourinary   Musculoskeletal  (+) Arthritis , Osteoarthritis,    Abdominal   Peds  Hematology negative hematology ROS (+)   Anesthesia Other Findings  Plts 166  Pt last seen by cardiology 06/09/2020.  Per OV note, "Coronary artery disease status post drug-eluting stent to the proximal RCA in November 2012. Myoview March 2015 was normal. He remains asymptomatic. Continue ASA, statin,  metoprolol. Follow up in one year."  Reproductive/Obstetrics                            Anesthesia Physical Anesthesia Plan  ASA: 3  Anesthesia Plan: General   Post-op Pain Management:    Induction: Intravenous  PONV Risk Score and Plan: 2 and Ondansetron, Dexamethasone, Treatment may vary due to age or medical condition and Midazolam  Airway Management Planned: Oral ETT and LMA  Additional Equipment: None  Intra-op Plan:   Post-operative Plan: Extubation in OR  Informed Consent: I have reviewed the patients History and Physical, chart, labs and discussed the procedure  including the risks, benefits and alternatives for the proposed anesthesia with the patient or authorized representative who has indicated his/her understanding and acceptance.     Dental advisory given  Plan Discussed with:   Anesthesia Plan Comments:         Anesthesia Quick Evaluation

## 2021-05-14 NOTE — Care Plan (Signed)
Ortho Bundle Case Management Note  Patient Details  Name: Robert [name] MRN: [id number] Date of Birth: Jan 22, 1946  [facility] RN Case manager call to patient prior to his Left total hip replacement with Dr. Ninfa Linden. He is an Ortho bundle patient through [facility] and is agreeable to case management. Patient lives with his wife, who will be assisting at home after surgery. He will need a FWW; declined a 3in1/BSC at this time. Unsure of need for HHPT at this time. Will see how patient does with therapy after surgery. Referral however has been made to [facility] after choice provided in case this is needed. Reviewed all post op care instructions. Will continue to follow for needs.                 DME Arranged:  [facility] Rile rolling DME Agency:  [employer]  HH Arranged:  PT HH Agency:  [facility]  Additional Comments: Please contact me with any questions of if this plan should need to change.  Jamse Arn, RN, BSN, [employer]  [phone] 05/14/2021, 2:50 PM

## 2021-05-14 NOTE — Evaluation (Signed)
Physical Therapy Evaluation Patient Details Name: Robert [name] MRN: [id number] DOB: 1946/11/10 Today's Date: 05/14/2021   History of Present Illness  Patient is 75 y.o. male s/p Lt THA anterior approach on 05/14/21 with PMH significant for CAD, MI, HTN, HLD, squamos cell carcinoma, back surgery.    Clinical Impression  Robert [name] is a 75 y.o. male POD 0 s/p Lt THA. Patient reports independence with mobility at baseline. Patient is now limited by functional impairments (see PT problem list below) and requires min assist for transfers and gait with RW. Patient was able to ambulate ~30 feet with RW and min assist. Patient instructed in exercise to facilitate circulation to manage edema and reduce risk of DVT. Patient will benefit from continued skilled PT interventions to address impairments and progress towards PLOF. Acute PT will follow to progress mobility and stair training in preparation for safe discharge home.     Follow Up Recommendations Follow surgeon's recommendation for DC plan and follow-up therapies;Home health PT    Equipment Recommendations  Rolling walker with 5" wheels;3in1 (PT)    Recommendations for Other Services       Precautions / Restrictions Precautions Precautions: Fall Restrictions Weight Bearing Restrictions: No Other Position/Activity Restrictions: WBAT      Mobility  Bed Mobility Overal bed mobility: Needs Assistance Bed Mobility: Supine to Sit     Supine to sit: Min assist;HOB elevated     General bed mobility comments: cues for use of bed rail, assist to bring trunk upright    Transfers Overall transfer level: Needs assistance Equipment used: Rolling walker (2 wheeled) Transfers: Sit to/from Stand Sit to Stand: Min assist;From elevated surface         General transfer comment: cues for safe technique with RW, pt required assist for power up and to steady in standing.  Ambulation/Gait Ambulation/Gait assistance: Min assist Gait Distance  (Feet): 30 Feet Assistive device: Rolling walker (2 wheeled) Gait Pattern/deviations: Step-to pattern;Decreased stride length;Decreased weight shift to left Gait velocity: decr   General Gait Details: cues for step pattern and proximity to RW, no overt LOB noted and min assist to manage walker position.pt c/o slight woozy sensation and distance limited.  Stairs            Wheelchair Mobility    Modified Rankin (Stroke Patients Only)       Balance Overall balance assessment: Needs assistance Sitting-balance support: No upper extremity supported;Feet supported Sitting balance-Leahy Scale: Good     Standing balance support: During functional activity;Bilateral upper extremity supported Standing balance-Leahy Scale: Poor                               Pertinent Vitals/Pain Pain Assessment: 0-10 Pain Score: 7  Pain Location: Lt hip Pain Descriptors / Indicators: Aching;Discomfort Pain Intervention(s): Limited activity within patient's tolerance;Monitored during session;Repositioned;Ice applied    Home Living Family/patient expects to be discharged to:: Private residence Living Arrangements: Spouse/significant other Available Help at Discharge: Family Type of Home: House Home Access: Level entry     Home Layout: Two level;Full bath on main level;Able to live on main level with bedroom/bathroom Home Equipment: Gilford Rile - 2 wheels;Bedside commode      Prior Function Level of Independence: Independent               Hand Dominance   Dominant Hand: Right    Extremity/Trunk Assessment   Upper Extremity Assessment Upper Extremity Assessment: Overall [facility]  for tasks assessed    Lower Extremity Assessment Lower Extremity Assessment: Overall WFL for tasks assessed    Cervical / Trunk Assessment Cervical / Trunk Assessment: Normal  Communication   Communication: No difficulties  Cognition Arousal/Alertness: Awake/alert Behavior During Therapy: WFL  for tasks assessed/performed Overall Cognitive Status: Within Functional Limits for tasks assessed                                        General Comments      Exercises Total Joint Exercises Ankle Circles/Pumps: AROM;Both;20 reps   Assessment/Plan    PT Assessment Patient needs continued PT services  PT Problem List Decreased strength;Decreased range of motion;Decreased activity tolerance;Decreased balance;Decreased mobility;Decreased knowledge of use of DME;Decreased knowledge of precautions       PT Treatment Interventions DME instruction;Gait training;Stair training;Functional mobility training;Therapeutic exercise;Balance training;Therapeutic activities;Patient/family education    PT Goals (Current goals can be found in the Care Plan section)  Acute Rehab PT Goals Patient Stated Goal: get back to independence PT Goal Formulation: With patient Time For Goal Achievement: 05/21/21 Potential to Achieve Goals: Good    Frequency 7X/week   Barriers to discharge        Co-evaluation               AM-PAC PT "6 Clicks" Mobility  Outcome Measure Help needed turning from your back to your side while in a flat bed without using bedrails?: A Little Help needed moving from lying on your back to sitting on the side of a flat bed without using bedrails?: A Little Help needed moving to and from a bed to a chair (including a wheelchair)?: A Little Help needed standing up from a chair using your arms (e.g., wheelchair or bedside chair)?: A Little Help needed to walk in hospital room?: A Little Help needed climbing 3-5 steps with a railing? : A Little 6 Click Score: 18    End of Session Equipment Utilized During Treatment: Gait belt Activity Tolerance: Patient tolerated treatment well Patient left: in chair;with call bell/phone within reach;with chair alarm set;with family/visitor present Nurse Communication: Mobility status PT Visit Diagnosis: Muscle weakness  (generalized) (M62.81);Difficulty in walking, not elsewhere classified (R26.2)    Time: 7412-8786 PT Time Calculation (min) (ACUTE ONLY): 19 min   Charges:   PT Evaluation $PT Eval Low Complexity: 1 Low          Verner Mould, DPT Acute Rehabilitation Services Office [phone] Pager [phone]   Jacques Navy 05/14/2021, 6:05 PM

## 2021-05-14 NOTE — TOC Transition Note (Signed)
Transition of Care [facility]) - CM/SW Discharge Note   Patient Details  Name: Robert [name] MRN: [id number] Date of Birth: [birth year]  Transition of Care [facility]) CM/SW Contact:  Lennart Pall, LCSW Phone Number: 05/14/2021, 4:25 PM   Clinical Narrative:     Met briefly with pt and confirming receipt of rw and 3n1 via [place].  Plan HHPT via [facility].  No further TOC needs.  Final next level of care: [place] Barriers to Discharge: No Barriers Identified   Patient Goals and CMS Choice Patient states their goals for this hospitalization and ongoing recovery are:: return home      Discharge Placement                       Discharge Plan and Services                DME Arranged: 3-N-1, Walker rolling DME Agency: [employer] Date DME Agency Contacted:  (prior to surgery)     HH Arranged: PT HH Agency: [facility]        Social Determinants of Health (SDOH) Interventions     Readmission Risk Interventions No flowsheet data found.

## 2021-05-15 DIAGNOSIS — E119 Type 2 diabetes mellitus without complications: Secondary | ICD-10-CM | POA: Diagnosis not present

## 2021-05-15 DIAGNOSIS — Z79899 Other long term (current) drug therapy: Secondary | ICD-10-CM | POA: Diagnosis not present

## 2021-05-15 DIAGNOSIS — S73192A Other sprain of left hip, initial encounter: Secondary | ICD-10-CM | POA: Diagnosis not present

## 2021-05-15 DIAGNOSIS — Z85828 Personal history of other malignant neoplasm of skin: Secondary | ICD-10-CM | POA: Diagnosis not present

## 2021-05-15 DIAGNOSIS — I10 Essential (primary) hypertension: Secondary | ICD-10-CM | POA: Diagnosis not present

## 2021-05-15 DIAGNOSIS — R531 Weakness: Secondary | ICD-10-CM | POA: Diagnosis not present

## 2021-05-15 DIAGNOSIS — Z955 Presence of coronary angioplasty implant and graft: Secondary | ICD-10-CM | POA: Diagnosis not present

## 2021-05-15 DIAGNOSIS — Z7982 Long term (current) use of aspirin: Secondary | ICD-10-CM | POA: Diagnosis not present

## 2021-05-15 DIAGNOSIS — I2510 Atherosclerotic heart disease of native coronary artery without angina pectoris: Secondary | ICD-10-CM | POA: Diagnosis not present

## 2021-05-15 DIAGNOSIS — Z96642 Presence of left artificial hip joint: Secondary | ICD-10-CM | POA: Diagnosis not present

## 2021-05-15 DIAGNOSIS — M1612 Unilateral primary osteoarthritis, left hip: Secondary | ICD-10-CM | POA: Diagnosis not present

## 2021-05-15 LAB — CBC
HCT: 36.1 % — ABNORMAL LOW (ref 39.0–52.0)
Hemoglobin: 11.9 g/dL — ABNORMAL LOW (ref 13.0–17.0)
MCH: 31.6 pg (ref 26.0–34.0)
MCHC: 33 g/dL (ref 30.0–36.0)
MCV: 96 fL (ref 80.0–100.0)
Platelets: 158 10*3/uL (ref 150–400)
RBC: 3.76 MIL/uL — ABNORMAL LOW (ref 4.22–5.81)
RDW: 13.2 % (ref 11.5–15.5)
WBC: 12.3 10*3/uL — ABNORMAL HIGH (ref 4.0–10.5)
nRBC: 0 % (ref 0.0–0.2)

## 2021-05-15 LAB — BASIC METABOLIC PANEL
Anion gap: 7 (ref 5–15)
BUN: 14 mg/dL (ref 8–23)
CO2: 28 mmol/L (ref 22–32)
Calcium: 8.5 mg/dL — ABNORMAL LOW (ref 8.9–10.3)
Chloride: 101 mmol/L (ref 98–111)
Creatinine, Ser: 0.81 mg/dL (ref 0.61–1.24)
GFR, Estimated: >60 mL/min (ref >60)
Glucose, Bld: 203 mg/dL — ABNORMAL HIGH (ref 70–99)
Potassium: 4.2 mmol/L (ref 3.5–5.1)
Sodium: 136 mmol/L (ref 135–145)

## 2021-05-15 MED ORDER — ASPIRIN 81 MG PO CHEW: 81.0000 mg | CHEWABLE_TABLET | Freq: Two times a day (BID) | ORAL | 0 refills | Status: DC

## 2021-05-15 MED ORDER — OXYCODONE HCL 5 MG PO TABS: 5.0000 mg | ORAL_TABLET | ORAL | 0 refills | Status: DC | PRN

## 2021-05-15 NOTE — Plan of Care (Signed)
Patient dc'd  

## 2021-05-15 NOTE — Discharge Instructions (Signed)

## 2021-05-15 NOTE — Plan of Care (Signed)
  Problem: Education: Goal: Knowledge of the prescribed therapeutic regimen will improve Outcome: Progressing   Problem: Activity: Goal: Ability to avoid complications of mobility impairment will improve Outcome: Progressing   Problem: Pain Management: Goal: Pain level will decrease with appropriate interventions Outcome: Progressing   

## 2021-05-15 NOTE — Plan of Care (Signed)
  Problem: Pain Management: Goal: Pain level will decrease with appropriate interventions Outcome: Progressing   

## 2021-05-15 NOTE — Discharge Summary (Signed)
Patient ID: Robert [name] MRN: [id number] DOB/AGE: 77-Dec-1947 77 y.o.  Admit date: 05/14/2021 Discharge date: 05/15/2021  Admission Diagnoses:  Principal Problem:   Avascular necrosis of hip, left ([place]) Active Problems:   Status post total replacement of left hip   Discharge Diagnoses:  S?P left total hip arthroplasty  Past Medical History:  Diagnosis Date   Arthritis    CAD (coronary artery disease) 10/10/2011   NSTEMI with DES to the proximal RCA following flow wire evaluation. EF is normal.    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 male who was admitted 05/14/2021 for operative treatment ofAvascular necrosis of hip, left ([place]). Patient has severe unremitting pain that affects sleep, daily activities, and work/hobbies. After pre-op clearance the patient was taken to the operating room on 05/14/2021 and underwent  Procedure(s): LEFT TOTAL HIP ARTHROPLASTY ANTERIOR APPROACH.    Patient was given perioperative antibiotics:  Anti-infectives (From admission, onward)    Start     Dose/Rate Route Frequency Ordered Stop   05/14/21 1600  clindamycin (CLEOCIN) IVPB 600 mg        600 mg 100 mL/hr over 30 Minutes Intravenous Every 6 hours 05/14/21 1336 05/14/21 2210   05/14/21 0630  clindamycin (CLEOCIN) IVPB 900 mg        900 mg 100 mL/hr over 30 Minutes Intravenous On call to O.R. 05/14/21 1601 05/14/21 0935        Patient was given sequential compression  devices, early ambulation, and chemoprophylaxis to prevent DVT.  Patient benefited maximally from hospital stay and there were no complications.    Recent vital signs: Patient Vitals for the past 24 hrs:  BP Temp Temp src Pulse Resp SpO2  05/15/21 1048 121/60 98.4 F (36.9 C) Oral 78 20 92 %  05/15/21 0547 (!) 145/61 99.8 F (37.7 C) Oral 93 16 94 %  05/15/21 0124 (!) 159/73 99.5 F (37.5 C) Oral 98 16 97 %  05/14/21 2101 (!) 167/70 98.2 F (36.8 C) Oral 96 16 99 %  05/14/21 1541 (!) 146/67 98.2 F (36.8 C) Oral 74 18 99 %  05/14/21 1330 (!) 148/73 98.3 F (36.8 C) Oral 64 16 100 %  05/14/21 1320 -- -- -- (!) 57 13 100 %  05/14/21 1310 120/78 -- -- (!) 58 20 100 %  05/14/21 1300 (!) 99/54 -- -- (!) 59 15 100 %  05/14/21 1250 -- -- -- (!) 57 14 98 %  05/14/21 1240 -- -- -- (!) 57 17 100 %  05/14/21 1230 (!) 155/67 -- -- (!) 53 14 94 %     Recent laboratory studies:  Recent Labs    05/15/21 0315  WBC 12.3*  HGB 11.9*  HCT 36.1*  PLT 158  NA 136  K 4.2  CL 101  CO2 28  BUN 14  CREATININE 0.81  GLUCOSE 203*  CALCIUM 8.5*     Discharge Medications:   Allergies as of 05/15/2021       Reactions   Sumatriptan Shortness Of Breath   REACTION: sob, headache scalp sensitivity   Penicillins Itching   Oxycodone Itching        Medication List     STOP taking these medications    aspirin EC 81 MG tablet Replaced by: aspirin 81 MG chewable tablet   dicyclomine 10 MG capsule Commonly known as: BENTYL   ezetimibe 10 MG tablet Commonly known as: ZETIA   metoprolol tartrate 50 MG tablet Commonly known as: LOPRESSOR   nitroGLYCERIN 0.4 MG SL tablet Commonly known as: NITROSTAT   pantoprazole 40 MG tablet Commonly known as: PROTONIX   rosuvastatin 40 MG tablet Commonly known as: CRESTOR   traMADol 50 MG tablet Commonly known as: ULTRAM       TAKE these medications    aspirin 81 MG chewable tablet Chew 1 tablet (81 mg total) by mouth 2 (two) times  daily. Replaces: aspirin EC 81 MG tablet   azelastine 0.1 % nasal spray Commonly known as: ASTELIN Place 1 spray into both nostrils 2 (two) times daily. Use in each nostril as directed   brimonidine 0.2 % ophthalmic solution Commonly known as: ALPHAGAN Place 1 drop into the right eye in the morning and at bedtime.   fluticasone 50 MCG/ACT nasal spray Commonly known as: FLONASE Place 1 spray into both nostrils daily as needed for allergies.   latanoprost 0.005 % ophthalmic solution Commonly known as: XALATAN Place 1 drop into the right eye at bedtime.   multivitamins ther. w/minerals Tabs tablet Take 1 tablet by mouth daily.   oxyCODONE 5 MG immediate release tablet Commonly known as: Oxy IR/ROXICODONE Take 1-2 tablets (5-10 mg total) by mouth every 4 (four) hours as needed for moderate pain (pain score 4-6).   SUPER B COMPLEX PO Take 1 tablet by mouth daily.   Systane Balance 0.6 % Soln Generic drug: Propylene Glycol Place 1 drop into both eyes 3 (three) times daily as needed (dry eyes).   vitamin C 1000 MG tablet Take 1,000 mg by mouth daily.   vitamin E 180 MG (400 UNITS) capsule Take 400 Units by mouth daily.               Durable Medical Equipment  (From admission, onward)           Start     Ordered   05/14/21 1337  DME 3 n 1  Once        05/14/21 1336   05/14/21 1337  DME Walker rolling  Once       Question Answer Comment  Walker: With 5 Inch Wheels   Patient needs a walker to treat with the following condition Status post left hip replacement      05/14/21 1336            Diagnostic Studies: MR Lumbar Spine w/o contrast  Result Date: 04/24/2021 CLINICAL DATA:  Chronic low back and left hip pain for the past year. No prior surgery. EXAM: MRI LUMBAR SPINE WITHOUT CONTRAST TECHNIQUE: Multiplanar, multisequence MR imaging of the lumbar spine was performed. No intravenous contrast was administered. COMPARISON:  CT lumbar spine dated April 18, 2018. 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 Alignment:  Physiologic. Vertebrae:  No fracture, evidence of discitis, or bone lesion. Conus medullaris and cauda equina: Conus extends to the L1 level. Conus and cauda  equina appear normal. Paraspinal and other soft tissues: Negative. Disc levels: T12-L1:  Negative. L1-L2:  Negative. L2-L3:  Minimal disc bulging.  No stenosis. L3-L4: Mild disc bulging and bilateral facet arthropathy. Mild bilateral lateral recess stenosis. No spinal canal or neuroforaminal stenosis. L4-L5: Mild disc bulging and endplate spurring with superimposed small left extraforaminal disc protrusion encroaching on the exiting left L4 nerve root. Mild bilateral facet arthropathy. Mild left lateral recess stenosis. No spinal canal or neuroforaminal stenosis. L5-S1: Small circumferential disc osteophyte complex. Mild bilateral facet arthropathy. Mild bilateral neuroforaminal stenosis. No spinal canal stenosis. IMPRESSION: 1. Mild multilevel degenerative changes of the lumbar spine as described above. Small left extraforaminal disc protrusion at L4-L5 encroaching on the exiting left L4 nerve root. Electronically Signed   By: Titus Dubin M.D.   On: 04/24/2021 18:12   DG Pelvis Portable  Result Date: 05/14/2021 CLINICAL DATA:  Status post left hip replacement EXAM: PORTABLE PELVIS 1-2 VIEWS COMPARISON:  Intraoperative films from earlier in the same day. FINDINGS: Left hip prosthesis is noted in satisfactory position. No acute soft tissue or bony abnormality is seen. IMPRESSION: Status post left hip replacement. Electronically Signed   By: Inez Catalina M.D.   On: 05/14/2021 11:29   DG C-Arm 1-60 Min-No Report  Result Date: 05/14/2021 Fluoroscopy was utilized by the requesting physician.  No radiographic interpretation.   DG HIP OPERATIVE UNILAT W OR W/O PELVIS LEFT  Result Date: 05/14/2021 CLINICAL DATA:  Left hip replacement EXAM: OPERATIVE  LEFT HIP WITH PELVIS COMPARISON:  02/03/2021 FLUOROSCOPY TIME:  Radiation Exposure Index (as provided by the fluoroscopic device): Not available If the device does not provide the exposure index: Fluoroscopy Time:  22 seconds Number of Acquired Images:  3 FINDINGS: Left hip replacement is now seen in satisfactory position. No acute bony or soft tissue abnormality is noted. IMPRESSION: Left hip replacement Electronically Signed   By: Inez Catalina M.D.   On: 05/14/2021 11:33    Disposition:      Follow-up Information     Mcarthur Rossetti, MD. Go on 05/26/2021.   Specialty: Orthopedic Surgery Why: at 9:30 am for your 2 week post op appointment with Dr. Ninfa Linden. Contact information: [address] [place] [address] [phone]                  Signed: Erskine Emery 05/15/2021, 12:27 PM

## 2021-05-15 NOTE — Progress Notes (Signed)
Physical Therapy Treatment Patient Details Name: Robert [name] MRN: [id number] DOB: [birth year] Today's Date: 05/15/2021    History of Present Illness Patient is 75 y.o. male s/p Lt THA anterior approach on 05/14/21 with PMH significant for CAD, MI, HTN, HLD, squamos cell carcinoma, back surgery.    PT Comments    Pt continues motivated and progressing well with mobility.  Pt up to ambulate in hall, negotiated stairs and reviewed car transfers with dtr present for session.  Pt mildly impulsive and requiring cues to slow down for safety.   Follow Up Recommendations  Follow surgeon's recommendation for DC plan and follow-up therapies;Home health PT     Equipment Recommendations  Rolling walker with 5" wheels;3in1 (PT)    Recommendations for Other Services       Precautions / Restrictions Precautions Precautions: Fall Restrictions Weight Bearing Restrictions: No LLE Weight Bearing: Weight bearing as tolerated    Mobility  Bed Mobility Overal bed mobility: Needs Assistance Bed Mobility: Supine to Sit     Supine to sit: Min guard     General bed mobility comments: Pt up in chair and requests back to same    Transfers Overall transfer level: Needs assistance Equipment used: Rolling walker (2 wheeled) Transfers: Sit to/from Stand Sit to Stand: Min guard;Supervision         General transfer comment: cues for LE management and use of UEs to self assist  Ambulation/Gait Ambulation/Gait assistance: Min guard;Supervision Gait Distance (Feet): 140 Feet Assistive device: Rolling walker (2 wheeled) Gait Pattern/deviations: Decreased stride length;Decreased weight shift to left;Step-to pattern;Step-through pattern;Antalgic Gait velocity: decr   General Gait Details: cues for sequence, posture and position from RW   Stairs Stairs: Yes Stairs assistance: Min guard Stair Management: No rails;Step to pattern;Forwards;With walker Number of Stairs: 2 General stair comments:  single step twice with cues for sequence and foot/RW placement   Wheelchair Mobility    Modified Rankin (Stroke Patients Only)       Balance Overall balance assessment: Needs assistance Sitting-balance support: No upper extremity supported;Feet supported Sitting balance-Leahy Scale: Good     Standing balance support: During functional activity;Bilateral upper extremity supported Standing balance-Leahy Scale: Fair                              Cognition Arousal/Alertness: Awake/alert Behavior During Therapy: WFL for tasks assessed/performed;Impulsive Overall Cognitive Status: Within Functional Limits for tasks assessed                                        Exercises Total Joint Exercises Ankle Circles/Pumps: AROM;Both;20 reps Quad Sets: AROM;Both;10 reps;Supine Heel Slides: AAROM;Left;20 reps;Supine Hip ABduction/ADduction: AAROM;Left;15 reps;Supine    General Comments        Pertinent Vitals/Pain Pain Assessment: 0-10 Pain Score: 6  Pain Location: Lt hip Pain Descriptors / Indicators: Aching;Discomfort;Burning Pain Intervention(s): Limited activity within patient's tolerance;Monitored during session;Premedicated before session;Ice applied    Home Living                      Prior Function            PT Goals (current goals can now be found in the care plan section) Acute Rehab PT Goals Patient Stated Goal: get back to independence PT Goal Formulation: With patient Time For Goal Achievement: 05/21/21 Potential to Achieve Goals:  Good Progress towards PT goals: Progressing toward goals    Frequency    7X/week      PT Plan Current plan remains appropriate    Co-evaluation              AM-PAC PT "6 Clicks" Mobility   Outcome Measure  Help needed turning from your back to your side while in a flat bed without using bedrails?: A Little Help needed moving from lying on your back to sitting on the side of a  flat bed without using bedrails?: A Little Help needed moving to and from a bed to a chair (including a wheelchair)?: A Little Help needed standing up from a chair using your arms (e.g., wheelchair or bedside chair)?: A Little Help needed to walk in hospital room?: A Little Help needed climbing 3-5 steps with a railing? : A Little 6 Click Score: 18    End of Session Equipment Utilized During Treatment: Gait belt Activity Tolerance: Patient tolerated treatment well Patient left: in chair;with call bell/phone within reach;with chair alarm set;with family/visitor present Nurse Communication: Mobility status PT Visit Diagnosis: Muscle weakness (generalized) (M62.81);Difficulty in walking, not elsewhere classified (R26.2)     Time: 4098-1191 PT Time Calculation (min) (ACUTE ONLY): 27 min  Charges:  $Gait Training: 8-22 mins $Therapeutic Exercise: 8-22 mins $Therapeutic Activity: 8-22 mins                     Debe Coder PT Acute Rehabilitation Services Pager [phone] Office [phone]    Robert [name] 05/15/2021, 12:46 PM

## 2021-05-15 NOTE — Progress Notes (Signed)
Physical Therapy Treatment Patient Details Name: Robert [name] MRN: [id number] DOB: 08/10/1946 Today's Date: 05/15/2021    History of Present Illness Patient is 75 y.o. male s/p Lt THA anterior approach on 05/14/21 with PMH significant for CAD, MI, HTN, HLD, squamos cell carcinoma, back surgery.    PT Comments    Pt very motivated and progressing well with mobility.  Pt hopeful for dc home this date.   Follow Up Recommendations  Follow surgeon's recommendation for DC plan and follow-up therapies;Home health PT     Equipment Recommendations  Rolling walker with 5" wheels;3in1 (PT)    Recommendations for Other Services       Precautions / Restrictions Precautions Precautions: Fall Restrictions Weight Bearing Restrictions: No LLE Weight Bearing: Weight bearing as tolerated    Mobility  Bed Mobility Overal bed mobility: Needs Assistance Bed Mobility: Supine to Sit     Supine to sit: Min guard     General bed mobility comments: cues for sequence and use of R LE to self assist    Transfers Overall transfer level: Needs assistance Equipment used: Rolling walker (2 wheeled) Transfers: Sit to/from Stand Sit to Stand: Min guard         General transfer comment: cues for LE management and use of UEs to self assist  Ambulation/Gait Ambulation/Gait assistance: Min assist;Min guard Gait Distance (Feet): 150 Feet Assistive device: Rolling walker (2 wheeled) Gait Pattern/deviations: Step-to pattern;Decreased stride length;Decreased weight shift to left Gait velocity: decr   General Gait Details: cues for sequence, posture and position from [employer]             Wheelchair Mobility    Modified Rankin (Stroke Patients Only)       Balance Overall balance assessment: Needs assistance Sitting-balance support: No upper extremity supported;Feet supported Sitting balance-Leahy Scale: Good     Standing balance support: During functional activity;Bilateral upper  extremity supported Standing balance-Leahy Scale: Poor                              Cognition Arousal/Alertness: Awake/alert Behavior During Therapy: WFL for tasks assessed/performed Overall Cognitive Status: Within Functional Limits for tasks assessed                                        Exercises Total Joint Exercises Ankle Circles/Pumps: AROM;Both;20 reps Quad Sets: AROM;Both;10 reps;Supine Heel Slides: AAROM;Left;20 reps;Supine Hip ABduction/ADduction: AAROM;Left;15 reps;Supine    General Comments        Pertinent Vitals/Pain Pain Assessment: 0-10 Pain Score: 7  Pain Location: Lt hip Pain Descriptors / Indicators: Aching;Discomfort;Burning Pain Intervention(s): Limited activity within patient's tolerance;Monitored during session;Premedicated before session;Ice applied    Home Living                      Prior Function            PT Goals (current goals can now be found in the care plan section) Acute Rehab PT Goals Patient Stated Goal: get back to independence PT Goal Formulation: With patient Time For Goal Achievement: 05/21/21 Potential to Achieve Goals: Good Progress towards PT goals: Progressing toward goals    Frequency    7X/week      PT Plan Current plan remains appropriate    Co-evaluation  AM-PAC PT "6 Clicks" Mobility   Outcome Measure  Help needed turning from your back to your side while in a flat bed without using bedrails?: A Little Help needed moving from lying on your back to sitting on the side of a flat bed without using bedrails?: A Little Help needed moving to and from a bed to a chair (including a wheelchair)?: A Little Help needed standing up from a chair using your arms (e.g., wheelchair or bedside chair)?: A Little Help needed to walk in hospital room?: A Little Help needed climbing 3-5 steps with a railing? : A Little 6 Click Score: 18    End of Session Equipment  Utilized During Treatment: Gait belt Activity Tolerance: Patient tolerated treatment well Patient left: in chair;with call bell/phone within reach;with chair alarm set;with family/visitor present Nurse Communication: Mobility status PT Visit Diagnosis: Muscle weakness (generalized) (M62.81);Difficulty in walking, not elsewhere classified (R26.2)     Time: 4680-3212 PT Time Calculation (min) (ACUTE ONLY): 32 min  Charges:  $Gait Training: 8-22 mins $Therapeutic Exercise: 8-22 mins                     Debe Coder PT Acute Rehabilitation Services Pager [phone] Office [phone]    Audray Rumore 05/15/2021, 12:41 PM

## 2021-05-15 NOTE — Progress Notes (Signed)
Subjective: 1 Day Post-Op Procedure(s) (LRB): LEFT TOTAL HIP ARTHROPLASTY ANTERIOR APPROACH (Left) Patient reports pain as moderate.  Eating, drinking and voiding well.   Objective: Vital signs in last 24 hours: Temp:  [98.2 F (36.8 C)-99.8 F (37.7 C)] 98.4 F (36.9 C) (07/02 1048) Pulse Rate:  [51-98] 78 (07/02 1048) Resp:  [13-20] 20 (07/02 1048) BP: (99-167)/(54-97) 121/60 (07/02 1048) SpO2:  [92 %-100 %] 92 % (07/02 1048)  Intake/Output from previous day: 07/01 0701 - 07/02 0700 In: 3520 [P.O.:720; I.V.:2550; IV Piggyback:250] Out: 6825 [Urine:1300; Blood:450] Intake/Output this shift: Total I/O In: 576.3 [P.O.:240; I.V.:336.3] Out: 100 [Urine:100]  Recent Labs    05/15/21 0315  HGB 11.9*   Recent Labs    05/15/21 0315  WBC 12.3*  RBC 3.76*  HCT 36.1*  PLT 158   Recent Labs    05/15/21 0315  NA 136  K 4.2  CL 101  CO2 28  BUN 14  CREATININE 0.81  GLUCOSE 203*  CALCIUM 8.5*   No results for input(s): LABPT, INR in the last 72 hours.  Left lower extremity: Incision: scant drainage Compartment soft Dorsi/plantarflexion intact   Assessment/Plan: 1 Day Post-Op Procedure(s) (LRB): LEFT TOTAL HIP ARTHROPLASTY ANTERIOR APPROACH (Left) Up with therapy Discharge home with home health      Erskine Emery 05/15/2021, 12:11 PM

## 2021-05-15 NOTE — TOC Transition Note (Signed)
Transition of Care [facility]) - CM/SW Discharge Note   Patient Details  Name: Robert [name] MRN: [id number] Date of Birth: Nov 09, 1946  [facility] CM/SW Contact:  Leeroy Cha, RN Phone Number: 05/15/2021, 10:29 AM   Clinical Narrative:    Discharge to home with 3 in 1 and rolling walker.  Hhc is setup per the notes dated 070122   Final next level of care: [place] Barriers to Discharge: No Barriers Identified   Patient Goals and CMS Choice Patient states their goals for this hospitalization and ongoing recovery are:: return home      Discharge Placement                       Discharge Plan and Services                DME Arranged: 3-N-1, Walker rolling DME Agency: [employer] Date DME Agency Contacted:  (prior to surgery)     HH Arranged: PT HH Agency: [place]        Social Determinants of Health (SDOH) Interventions     Readmission Risk Interventions No flowsheet data found.

## 2021-05-16 DIAGNOSIS — Z471 Aftercare following joint replacement surgery: Secondary | ICD-10-CM | POA: Diagnosis not present

## 2021-05-16 DIAGNOSIS — E1169 Type 2 diabetes mellitus with other specified complication: Secondary | ICD-10-CM | POA: Diagnosis not present

## 2021-05-16 DIAGNOSIS — I10 Essential (primary) hypertension: Secondary | ICD-10-CM | POA: Diagnosis not present

## 2021-05-16 DIAGNOSIS — G43909 Migraine, unspecified, not intractable, without status migrainosus: Secondary | ICD-10-CM | POA: Diagnosis not present

## 2021-05-16 DIAGNOSIS — K219 Gastro-esophageal reflux disease without esophagitis: Secondary | ICD-10-CM | POA: Diagnosis not present

## 2021-05-16 DIAGNOSIS — I2510 Atherosclerotic heart disease of native coronary artery without angina pectoris: Secondary | ICD-10-CM | POA: Diagnosis not present

## 2021-05-16 DIAGNOSIS — G4733 Obstructive sleep apnea (adult) (pediatric): Secondary | ICD-10-CM | POA: Diagnosis not present

## 2021-05-16 DIAGNOSIS — E785 Hyperlipidemia, unspecified: Secondary | ICD-10-CM | POA: Diagnosis not present

## 2021-05-16 DIAGNOSIS — I252 Old myocardial infarction: Secondary | ICD-10-CM | POA: Diagnosis not present

## 2021-05-18 ENCOUNTER — Encounter (HOSPITAL_COMMUNITY): Payer: Self-pay | Admitting: Orthopaedic Surgery

## 2021-05-18 ENCOUNTER — Telehealth: Payer: Self-pay | Admitting: Cardiology

## 2021-05-18 MED ORDER — NITROGLYCERIN 0.4 MG SL SUBL: 0.4000 mg | SUBLINGUAL_TABLET | SUBLINGUAL | 0 refills | Status: DC | PRN

## 2021-05-18 NOTE — Telephone Encounter (Signed)
     Pt c/o medication issue:  1. Name of Medication: Nitroglycerin 0.4 mg  2. How are you currently taking this medication (dosage and times per day)?   3. Are you having a reaction (difficulty breathing--STAT)?   4. What is your medication issue? Pt's wife calling refill for Nitroglycerin need a new bottle, not on the pt's med list, she said to send prescription to [id number] - [place], [place]

## 2021-05-18 NOTE — Telephone Encounter (Signed)
Attempted to call pt to assess for Nitro need. Will send in new script with no refill. Nitro was listed on active med list during last OV with MD.

## 2021-05-19 DIAGNOSIS — I252 Old myocardial infarction: Secondary | ICD-10-CM | POA: Diagnosis not present

## 2021-05-19 DIAGNOSIS — I2510 Atherosclerotic heart disease of native coronary artery without angina pectoris: Secondary | ICD-10-CM | POA: Diagnosis not present

## 2021-05-19 DIAGNOSIS — G4733 Obstructive sleep apnea (adult) (pediatric): Secondary | ICD-10-CM | POA: Diagnosis not present

## 2021-05-19 DIAGNOSIS — E785 Hyperlipidemia, unspecified: Secondary | ICD-10-CM | POA: Diagnosis not present

## 2021-05-19 DIAGNOSIS — E1169 Type 2 diabetes mellitus with other specified complication: Secondary | ICD-10-CM | POA: Diagnosis not present

## 2021-05-19 DIAGNOSIS — I10 Essential (primary) hypertension: Secondary | ICD-10-CM | POA: Diagnosis not present

## 2021-05-19 DIAGNOSIS — G43909 Migraine, unspecified, not intractable, without status migrainosus: Secondary | ICD-10-CM | POA: Diagnosis not present

## 2021-05-19 DIAGNOSIS — Z471 Aftercare following joint replacement surgery: Secondary | ICD-10-CM | POA: Diagnosis not present

## 2021-05-19 DIAGNOSIS — K219 Gastro-esophageal reflux disease without esophagitis: Secondary | ICD-10-CM | POA: Diagnosis not present

## 2021-05-20 MED ORDER — NITROGLYCERIN 0.4 MG SL SUBL: 0.4000 mg | SUBLINGUAL_TABLET | SUBLINGUAL | 11 refills | Status: DC | PRN

## 2021-05-20 NOTE — Telephone Encounter (Signed)
Spoke to patient he stated his NTG is old and needs refilling.Advised NTG was refilled.I will put additional refills on prescription.

## 2021-05-21 ENCOUNTER — Telehealth: Payer: Self-pay

## 2021-05-21 ENCOUNTER — Other Ambulatory Visit: Payer: Self-pay | Admitting: Orthopaedic Surgery

## 2021-05-21 DIAGNOSIS — I252 Old myocardial infarction: Secondary | ICD-10-CM | POA: Diagnosis not present

## 2021-05-21 DIAGNOSIS — G43909 Migraine, unspecified, not intractable, without status migrainosus: Secondary | ICD-10-CM | POA: Diagnosis not present

## 2021-05-21 DIAGNOSIS — E1169 Type 2 diabetes mellitus with other specified complication: Secondary | ICD-10-CM | POA: Diagnosis not present

## 2021-05-21 DIAGNOSIS — Z471 Aftercare following joint replacement surgery: Secondary | ICD-10-CM | POA: Diagnosis not present

## 2021-05-21 DIAGNOSIS — G4733 Obstructive sleep apnea (adult) (pediatric): Secondary | ICD-10-CM | POA: Diagnosis not present

## 2021-05-21 DIAGNOSIS — K219 Gastro-esophageal reflux disease without esophagitis: Secondary | ICD-10-CM | POA: Diagnosis not present

## 2021-05-21 DIAGNOSIS — E785 Hyperlipidemia, unspecified: Secondary | ICD-10-CM | POA: Diagnosis not present

## 2021-05-21 DIAGNOSIS — I10 Essential (primary) hypertension: Secondary | ICD-10-CM | POA: Diagnosis not present

## 2021-05-21 DIAGNOSIS — I2510 Atherosclerotic heart disease of native coronary artery without angina pectoris: Secondary | ICD-10-CM | POA: Diagnosis not present

## 2021-05-21 MED ORDER — TIZANIDINE HCL 4 MG PO TABS: 4.0000 mg | ORAL_TABLET | Freq: Three times a day (TID) | ORAL | 0 refills | Status: DC | PRN

## 2021-05-21 NOTE — Telephone Encounter (Signed)
I called and talked to the pt. He said he is doing ok. Just didn't realize how hard of a recovery this would be. He denied any calf pain or swelling, no redness around the bandage and no chills. I instructed pt to try to get up and walk as much as he can safely. He stated understanding and would see [place] on Wednesday

## 2021-05-21 NOTE — Telephone Encounter (Signed)
Patients daughter called she said patient is still having low grade temps, lower BP, a lot of pain, "wobbly" Recent left THA

## 2021-05-24 ENCOUNTER — Telehealth: Payer: Self-pay

## 2021-05-24 ENCOUNTER — Telehealth: Payer: Self-pay | Admitting: *Deleted

## 2021-05-24 DIAGNOSIS — Z471 Aftercare following joint replacement surgery: Secondary | ICD-10-CM | POA: Diagnosis not present

## 2021-05-24 DIAGNOSIS — E1169 Type 2 diabetes mellitus with other specified complication: Secondary | ICD-10-CM | POA: Diagnosis not present

## 2021-05-24 DIAGNOSIS — G4733 Obstructive sleep apnea (adult) (pediatric): Secondary | ICD-10-CM | POA: Diagnosis not present

## 2021-05-24 DIAGNOSIS — I10 Essential (primary) hypertension: Secondary | ICD-10-CM | POA: Diagnosis not present

## 2021-05-24 DIAGNOSIS — I2510 Atherosclerotic heart disease of native coronary artery without angina pectoris: Secondary | ICD-10-CM | POA: Diagnosis not present

## 2021-05-24 DIAGNOSIS — I252 Old myocardial infarction: Secondary | ICD-10-CM | POA: Diagnosis not present

## 2021-05-24 DIAGNOSIS — G43909 Migraine, unspecified, not intractable, without status migrainosus: Secondary | ICD-10-CM | POA: Diagnosis not present

## 2021-05-24 DIAGNOSIS — E785 Hyperlipidemia, unspecified: Secondary | ICD-10-CM | POA: Diagnosis not present

## 2021-05-24 DIAGNOSIS — K219 Gastro-esophageal reflux disease without esophagitis: Secondary | ICD-10-CM | POA: Diagnosis not present

## 2021-05-24 NOTE — Telephone Encounter (Signed)
Ortho bundle D/C and 7 day call combined.

## 2021-05-24 NOTE — Telephone Encounter (Signed)
Patient's daughter Seth Bake called stating that patient has a knot where the incision is on his left hip,  has had some bleeding, and the pain has gotten worse.  Would like to know if patient needs to come in to be seen?  Cb# [phone].  Please advise.  Thank you.

## 2021-05-24 NOTE — Telephone Encounter (Signed)
Please advise 

## 2021-05-24 NOTE — Telephone Encounter (Signed)
I called and talked to the pt's daughter and she emailed me a picture of incision. Bandage was still on in the picture so unable to see incision. Therapist is scheduled to see the pt today and we advised the daughter to have therapist remove dressing and look at incision prior to placing new bandage on to see if the therapist thinks it looks ok. If not daughter is to call me back for pt to be worked in today with [employer]

## 2021-05-24 NOTE — Care Plan (Signed)
RNCM call to patient after discharge from hospital. Patient is an Ortho bundle patient and was discharged on 05/15/21. RNCM was out of office for week on vacation with no access to documentation. This is a discharge call as well as a 1 week call combined. Discussed pain and his current status. He reports having regular bowel movements and slightly elevated temperature post op have improved. Felt he'd be further along with pain that this at this point. CM has spoken with HHPT, who will be coming today and removing bandage to observe incision. He is concerned regarding a "lump" at the incision. Discussed that this could possibly be a seroma, which is normal after this type of surgery. Discussed muscle relaxer use, ice, elevation, pain medication and use of compression hose for any swelling. HHPT sent photo of incision area during his visit today. Incision with staples present is clean, dry and intact without any signs of infection or issues at this time. Attempted call back to patient and left voice message reassuring him that his incision looks normal at this time. Reminded to call CM for questions or needs.

## 2021-05-26 ENCOUNTER — Telehealth: Payer: Self-pay | Admitting: *Deleted

## 2021-05-26 ENCOUNTER — Encounter: Payer: Self-pay | Admitting: Orthopaedic Surgery

## 2021-05-26 ENCOUNTER — Ambulatory Visit (INDEPENDENT_AMBULATORY_CARE_PROVIDER_SITE_OTHER): Payer: Medicare HMO | Admitting: Orthopaedic Surgery

## 2021-05-26 ENCOUNTER — Other Ambulatory Visit: Payer: Self-pay

## 2021-05-26 DIAGNOSIS — Z96642 Presence of left artificial hip joint: Secondary | ICD-10-CM

## 2021-05-26 NOTE — Telephone Encounter (Signed)
Ortho bundle 14 day in office meeting completed. °

## 2021-05-26 NOTE — Progress Notes (Signed)
HPI: [name] returns today status post left total hip arthroplasty now just 12 days postop.  He is overall doing well he is walking with a cane.  He did have fevers chills after surgery there is some question of possible UTI was placed on antibiotics.  He is complaining now pain lateral aspect of the hip due to swelling.  Otherwise knee feels like his range of motion and strength are improving.  He is on aspirin for DVT prophylaxis.  He is on 81 mg aspirin once daily prior to surgery.  Has taken tramadol only for pain.  Physical exam: Left hip surgical incisions well-healed well approximated with staples.  No signs of dehiscence.  Positive seroma.  Seroma is aspirated today 45 cc of serosanguineous fluid obtained patient tolerates well.  Left calf supple nontender dorsiflexion plantarflexion ankle intact.  Impression: Status post left total hip arthroplasty 05/14/2021.  Plan: He will go back on his 81 mg aspirin daily.  He follow-up with [place] in 4 weeks sooner if there is any questions or concerns.  For the seroma recurs he can always come in to have it aspirated.  He is activity as tolerated.  Questions were encouraged and answered by Dr. Ninfa Linden and myself.

## 2021-05-27 ENCOUNTER — Telehealth: Payer: Self-pay

## 2021-05-27 DIAGNOSIS — I10 Essential (primary) hypertension: Secondary | ICD-10-CM | POA: Diagnosis not present

## 2021-05-27 DIAGNOSIS — E1169 Type 2 diabetes mellitus with other specified complication: Secondary | ICD-10-CM | POA: Diagnosis not present

## 2021-05-27 DIAGNOSIS — E785 Hyperlipidemia, unspecified: Secondary | ICD-10-CM | POA: Diagnosis not present

## 2021-05-27 DIAGNOSIS — Z471 Aftercare following joint replacement surgery: Secondary | ICD-10-CM | POA: Diagnosis not present

## 2021-05-27 DIAGNOSIS — I252 Old myocardial infarction: Secondary | ICD-10-CM | POA: Diagnosis not present

## 2021-05-27 DIAGNOSIS — G4733 Obstructive sleep apnea (adult) (pediatric): Secondary | ICD-10-CM | POA: Diagnosis not present

## 2021-05-27 DIAGNOSIS — I2510 Atherosclerotic heart disease of native coronary artery without angina pectoris: Secondary | ICD-10-CM | POA: Diagnosis not present

## 2021-05-27 DIAGNOSIS — K219 Gastro-esophageal reflux disease without esophagitis: Secondary | ICD-10-CM | POA: Diagnosis not present

## 2021-05-27 DIAGNOSIS — G43909 Migraine, unspecified, not intractable, without status migrainosus: Secondary | ICD-10-CM | POA: Diagnosis not present

## 2021-05-27 NOTE — Telephone Encounter (Signed)
Faxed note to provided number

## 2021-05-27 NOTE — Telephone Encounter (Signed)
Dreama with Dr. Karren Burly office would like note/letter faxed for pre medication needed before dental procedures.  Fax# [phone].  CB# [phone].  Please advise.  Thank you

## 2021-05-28 ENCOUNTER — Telehealth: Payer: Self-pay | Admitting: *Deleted

## 2021-05-28 ENCOUNTER — Other Ambulatory Visit: Payer: Self-pay | Admitting: Orthopaedic Surgery

## 2021-05-28 MED ORDER — HYDROCODONE-ACETAMINOPHEN 5-325 MG PO TABS: 1.0000 | ORAL_TABLET | Freq: Four times a day (QID) | ORAL | 0 refills | Status: DC | PRN

## 2021-05-28 NOTE — Telephone Encounter (Signed)
Also daughter called and states the Oxycodone was a little too strong for him, so he is not taking. He has been taking Tramadol, which may not be strong enough. Could we get him something in between please, to maybe help with pain and rest? Thanks.

## 2021-05-28 NOTE — Telephone Encounter (Signed)
Received call from patient's daughter today that his seroma, drained on Wednesday, has returned. I explained to alternate ice/heat over the weekend and put him on the schedule to have drained again for Monday.

## 2021-05-28 NOTE — Telephone Encounter (Signed)
Call to patient and updated on new prescription.

## 2021-05-31 ENCOUNTER — Ambulatory Visit: Payer: Medicare HMO | Admitting: Orthopaedic Surgery

## 2021-06-09 ENCOUNTER — Encounter: Payer: Self-pay | Admitting: Family Medicine

## 2021-06-09 ENCOUNTER — Ambulatory Visit (INDEPENDENT_AMBULATORY_CARE_PROVIDER_SITE_OTHER): Payer: Medicare HMO | Admitting: Family Medicine

## 2021-06-09 ENCOUNTER — Other Ambulatory Visit: Payer: Self-pay

## 2021-06-09 VITALS — BP 128/72 | HR 75 | Temp 97.2°F | Ht 73.0 in | Wt 208.0 lb

## 2021-06-09 DIAGNOSIS — E1169 Type 2 diabetes mellitus with other specified complication: Secondary | ICD-10-CM

## 2021-06-09 DIAGNOSIS — Z0001 Encounter for general adult medical examination with abnormal findings: Secondary | ICD-10-CM

## 2021-06-09 DIAGNOSIS — Z6827 Body mass index (BMI) 27.0-27.9, adult: Secondary | ICD-10-CM

## 2021-06-09 DIAGNOSIS — E663 Overweight: Secondary | ICD-10-CM

## 2021-06-09 DIAGNOSIS — I152 Hypertension secondary to endocrine disorders: Secondary | ICD-10-CM

## 2021-06-09 DIAGNOSIS — E1165 Type 2 diabetes mellitus with hyperglycemia: Secondary | ICD-10-CM

## 2021-06-09 DIAGNOSIS — E1159 Type 2 diabetes mellitus with other circulatory complications: Secondary | ICD-10-CM | POA: Diagnosis not present

## 2021-06-09 DIAGNOSIS — E785 Hyperlipidemia, unspecified: Secondary | ICD-10-CM

## 2021-06-09 LAB — CBC
HCT: 36.3 % — ABNORMAL LOW (ref 39.0–52.0)
Hemoglobin: 11.9 g/dL — ABNORMAL LOW (ref 13.0–17.0)
MCHC: 32.9 g/dL (ref 30.0–36.0)
MCV: 93.8 fl (ref 78.0–100.0)
Platelets: 236 10*3/uL (ref 150.0–400.0)
RBC: 3.87 Mil/uL — ABNORMAL LOW (ref 4.22–5.81)
RDW: 14.5 % (ref 11.5–15.5)
WBC: 6.8 10*3/uL (ref 4.0–10.5)

## 2021-06-09 LAB — COMPREHENSIVE METABOLIC PANEL
ALT: 22 U/L (ref 0–53)
AST: 20 U/L (ref 0–37)
Albumin: 4 g/dL (ref 3.5–5.2)
Alkaline Phosphatase: 100 U/L (ref 39–117)
BUN: 12 mg/dL (ref 6–23)
CO2: 28 mEq/L (ref 19–32)
Calcium: 9.6 mg/dL (ref 8.4–10.5)
Chloride: 99 mEq/L (ref 96–112)
Creatinine, Ser: 0.83 mg/dL (ref 0.40–1.50)
GFR: 84.42 mL/min (ref >60.00)
Glucose, Bld: 146 mg/dL — ABNORMAL HIGH (ref 70–99)
Potassium: 4.7 mEq/L (ref 3.5–5.1)
Sodium: 136 mEq/L (ref 135–145)
Total Bilirubin: 0.5 mg/dL (ref 0.2–1.2)
Total Protein: 7.1 g/dL (ref 6.0–8.3)

## 2021-06-09 LAB — MICROALBUMIN / CREATININE URINE RATIO
Creatinine,U: 64.6 mg/dL
Microalb Creat Ratio: 1.1 mg/g (ref 0.0–30.0)
Microalb, Ur: <0.7 mg/dL (ref 0.0–1.9)

## 2021-06-09 LAB — TSH: TSH: 1.93 u[IU]/mL (ref 0.35–5.50)

## 2021-06-09 LAB — LIPID PANEL
Cholesterol: 128 mg/dL (ref 0–200)
HDL: 54.6 mg/dL (ref >39.00)
LDL Cholesterol: 48 mg/dL (ref 0–99)
NonHDL: 72.94
Total CHOL/HDL Ratio: 2
Triglycerides: 124 mg/dL (ref 0.0–149.0)
VLDL: 24.8 mg/dL (ref 0.0–40.0)

## 2021-06-09 LAB — HEMOGLOBIN A1C: Hgb A1c MFr Bld: 7.3 % — ABNORMAL HIGH (ref 4.6–6.5)

## 2021-06-09 NOTE — Assessment & Plan Note (Addendum)
Check lipids.  He is on Crestor 40 mg daily and Zetia 5 mg daily.

## 2021-06-09 NOTE — Patient Instructions (Signed)
It was very nice to see you today!  We will check blood work today.  We will see you back in a year.  Come back to see me sooner if needed.  Take care, Dr Jerline Pain  PLEASE NOTE:  If you had any lab tests please let us know if you have not heard back within a few days. You may see your results on mychart before we have a chance to review them but we will give you a call once they are reviewed by [place]. If we ordered any referrals today, please let us know if you have not heard from their office within the next week.   Please try these tips to maintain a healthy lifestyle:  Eat at least 3 REAL meals and 1-2 snacks per day.  Aim for no more than 5 hours between eating.  If you eat breakfast, please do so within one hour of getting up.   Each meal should contain half fruits/vegetables, one quarter protein, and one quarter carbs (no bigger than a computer mouse)  Cut down on sweet beverages. This includes juice, soda, and sweet tea.   Drink at least 1 glass of water with each meal and aim for at least 8 glasses per day  Exercise at least 150 minutes every week.    Preventive Care 86 Years and Older, Male Preventive care refers to lifestyle choices and visits with your health care provider that can promote health and wellness. This includes: A yearly physical exam. This is also called an annual wellness visit. Regular dental and eye exams. Immunizations. Screening for certain conditions. Healthy lifestyle choices, such as: Eating a healthy diet. Getting regular exercise. Not using drugs or products that contain nicotine and tobacco. Limiting alcohol use. What can I expect for my preventive care visit? Physical exam Your health care provider will check your: Height and weight. These may be used to calculate your BMI (body mass index). BMI is a measurement that tells if you are at a healthy weight. Heart rate and blood pressure. Body temperature. Skin for abnormal  spots. Counseling Your health care provider may ask you questions about your: Past medical problems. Family's medical history. Alcohol, tobacco, and drug use. Emotional well-being. Home life and relationship well-being. Sexual activity. Diet, exercise, and sleep habits. History of falls. Memory and ability to understand (cognition). Work and work Statistician. Access to firearms. What immunizations do I need?  Vaccines are usually given at various ages, according to a schedule. Your health care provider will recommend vaccines for you based on your age, medicalhistory, and lifestyle or other factors, such as travel or where you work. What tests do I need? Blood tests Lipid and cholesterol levels. These may be checked every 5 years, or more often depending on your overall health. Hepatitis C test. Hepatitis B test. Screening Lung cancer screening. You may have this screening every year starting at age 13 if you have a 30-pack-year history of smoking and currently smoke or have quit within the past 15 years. Colorectal cancer screening. All adults should have this screening starting at age 86 and continuing until age 78. Your health care provider may recommend screening at age 66 if you are at increased risk. You will have tests every 1-10 years, depending on your results and the type of screening test. Prostate cancer screening. Recommendations will vary depending on your family history and other risks. Genital exam to check for testicular cancer or hernias. Diabetes screening. This is done by checking your  blood sugar (glucose) after you have not eaten for a while (fasting). You may have this done every 1-3 years. Abdominal aortic aneurysm (AAA) screening. You may need this if you are a current or former smoker. STD (sexually transmitted disease) testing, if you are at risk. Follow these instructions at home: Eating and drinking  Eat a diet that includes fresh fruits and  vegetables, whole grains, lean protein, and low-fat dairy products. Limit your intake of foods with high amounts of sugar, saturated fats, and salt. Take vitamin and mineral supplements as recommended by your health care provider. Do not drink alcohol if your health care provider tells you not to drink. If you drink alcohol: Limit how much you have to 0-2 drinks a day. Be aware of how much alcohol is in your drink. In the [place] one drink equals one 12 oz bottle of beer (355 mL), one 5 oz glass of wine (148 mL), or one 1 oz glass of hard liquor (44 mL).  Lifestyle Take daily care of your teeth and gums. Brush your teeth every morning and night with fluoride toothpaste. Floss one time each day. Stay active. Exercise for at least 30 minutes 5 or more days each week. Do not use any products that contain nicotine or tobacco, such as cigarettes, e-cigarettes, and chewing tobacco. If you need help quitting, ask your health care provider. Do not use drugs. If you are sexually active, practice safe sex. Use a condom or other form of protection to prevent STIs (sexually transmitted infections). Talk with your health care provider about taking a low-dose aspirin or statin. Find healthy ways to cope with stress, such as: Meditation, yoga, or listening to music. Journaling. Talking to a trusted person. Spending time with friends and family. Safety Always wear your seat belt while driving or riding in a vehicle. Do not drive: If you have been drinking alcohol. Do not ride with someone who has been drinking. When you are tired or distracted. While texting. Wear a helmet and other protective equipment during sports activities. If you have firearms in your house, make sure you follow all gun safety procedures. What's next? Visit your health care provider once a year for an annual wellness visit. Ask your health care provider how often you should have your eyes and teeth checked. Stay up to date on all  vaccines. This information is not intended to replace advice given to you by your health care provider. Make sure you discuss any questions you have with your healthcare provider. Document Revised: 07/30/2019 Document Reviewed: 10/25/2018 Elsevier Patient Education  2022 [employer].

## 2021-06-09 NOTE — Progress Notes (Signed)
Chief Complaint:  Robert [name] is a 75 y.o. male who presents today for his annual comprehensive physical exam.    Assessment/Plan:  Chronic Problems Addressed Today: Hyperlipidemia associated with type 2 diabetes mellitus (Robert [name]) Check lipids.  He is on Crestor 40 mg daily and Zetia 5 mg daily.  Hypertension associated with diabetes (Robert [name]) At goal on metoprolol tartrate 12.5 mg daily.  Type 2 diabetes mellitus with hyperglycemia (Robert [name]) Check A1c.  Not currently on any medications.   Body mass index is 27.44 kg/m. / Overweight  BMI Metric Follow Up - 06/09/21 0941       BMI Metric Follow Up-Please document annually   BMI Metric Follow Up Education provided              Preventative Healthcare: Check Labs. UTD on vaccines.   Patient Counseling(The following topics were reviewed and/or handout was given):  -Nutrition: Stressed importance of moderation in sodium/caffeine intake, saturated fat and cholesterol, caloric balance, sufficient intake of fresh fruits, vegetables, and fiber.  -Stressed the importance of regular exercise.   -Substance Abuse: Discussed cessation/primary prevention of tobacco, alcohol, or other drug use; driving or other dangerous activities under the influence; availability of treatment for abuse.   -Injury prevention: Discussed safety belts, safety helmets, smoke detector, smoking near bedding or upholstery.   -Sexuality: Discussed sexually transmitted diseases, partner selection, use of condoms, avoidance of unintended pregnancy and contraceptive alternatives.   -Dental health: Discussed importance of regular tooth brushing, flossing, and dental visits.  -Health maintenance and immunizations reviewed. Please refer to Health maintenance section.  Return to care in 1 year for next preventative visit.     Subjective:  HPI:  He has no acute complaints today.   He had a total hip replacement three weeks ago on 05/14/2021 with Robert. Ninfa [name]. Doing well  post operatively.   Lifestyle Diet: Reasonably healthy diet Exercise: Plays golf, exercise is limited due to surgery recovery   Depression screen [facility] 2/9 06/09/2021  Decreased Interest 0  Down, Depressed, Hopeless 0  PHQ - 2 Score 0  Altered sleeping -  Tired, decreased energy -  Change in appetite -  Feeling bad or failure about yourself  -  Trouble concentrating -  Moving slowly or fidgety/restless -  Suicidal thoughts -  PHQ-9 Score -  Difficult doing work/chores -    Health Maintenance Due  Topic Date Due   URINE MICROALBUMIN  05/24/2019   OPHTHALMOLOGY EXAM  11/14/2019   FOOT EXAM  05/28/2020   HEMOGLOBIN A1C  06/09/2021     ROS: Per HPI, otherwise a complete review of systems was negative.   PMH:  The following were reviewed and entered/updated in epic: Past Medical History:  Diagnosis Date   Arthritis    CAD (coronary artery disease) 10/10/2011   NSTEMI with DES to the proximal RCA following flow wire evaluation. EF is normal.    GERD (gastroesophageal reflux disease)    H/O esophageal ulcer    History of stomach ulcers    Hyperlipidemia    Hypertension    Migraines    "quite often; maybe q 10d to 2 wk" (01/14/2014)   Myocardial infarction (Robert [name]) 10/2011   OSA on CPAP    "wear it most of the time" (01/14/2014)   Pre-diabetes    Squamous cell cancer of skin of earlobe    "left"   Squamous cell carcinoma 12/17/2016   Patient Active Problem List   Diagnosis Date Noted   Status post total  replacement of left hip 05/14/2021   Type 2 diabetes mellitus with hyperglycemia ([name]) 05/23/2018   Chronically dry eyes, bilateral 05/23/2018   Migraines 11/28/2016   Hypertension associated with diabetes (Robert [name]) 10/11/2011   History of basal cell carcinoma (Robert [name]), right ear 03/03/2009   Hyperlipidemia associated with type 2 diabetes mellitus (Robert [name]) 01/11/2008   Allergic rhinitis 01/07/2008   Prostatitis, recurrent 01/07/2008   History of gastric ulcer 01/07/2008   History  of colon polyps, followed by Robert. Earlean [name] 01/07/2008   GERD 04/02/2007   Sleep apnea 04/02/2007   Past Surgical History:  Procedure Laterality Date   BACK SURGERY     CATARACT EXTRACTION Bilateral 2019   CATARACT EXTRACTION W/ INTRAOCULAR LENS IMPLANT Bilateral 01/2019   CERVICAL DISC SURGERY  1980's?   CORONARY ANGIOPLASTY WITH STENT PLACEMENT  10/13/2011   DES to the proximal RCA. Robert [name]   FRACTIONAL FLOW RESERVE WIRE Right 10/13/2011   Procedure: [place];  Surgeon: Robert M Martinique, MD;  Location: [facility] CATH LAB;  Service: Cardiovascular;  Laterality: Right;   HERNIA REPAIR     INGUINAL HERNIA REPAIR  10/23/2012   Procedure: HERNIA REPAIR INGUINAL ADULT;  Surgeon: Robert Hollingshead, MD;  Location: [place];  Service: General;  Laterality: Left;  Left lower quadrant; left inguinal hernia repair with mesh   INSERTION OF MESH  10/23/2012   Procedure: INSERTION OF MESH;  Surgeon: Robert Hollingshead, MD;  Location: [place];  Service: General;  Laterality: Left;  Left lower quadrant   LEFT HEART CATHETERIZATION WITH CORONARY ANGIOGRAM N/A 10/13/2011   Procedure: LEFT HEART CATHETERIZATION WITH CORONARY ANGIOGRAM;  Surgeon: Robert M Martinique, MD;  Location: [facility] CATH LAB;  Service: Cardiovascular;  Laterality: N/A;   PERCUTANEOUS CORONARY STENT INTERVENTION (PCI-S) Right 10/13/2011   Procedure: PERCUTANEOUS CORONARY STENT INTERVENTION (PCI-S);  Surgeon: Robert M Martinique, MD;  Location: [facility] CATH LAB;  Service: Cardiovascular;  Laterality: Right;   SHOULDER ARTHROSCOPY W/ ROTATOR CUFF REPAIR Left 1990's   TOTAL HIP ARTHROPLASTY Left 05/14/2021   Procedure: LEFT TOTAL HIP ARTHROPLASTY ANTERIOR APPROACH;  Surgeon: Robert Rossetti, MD;  Location: [facility] ORS;  Service: Orthopedics;  Laterality: Left;   TRIGGER FINGER RELEASE Left    3rd and 4th digits    Family History  Problem Relation Age of Onset   Cancer Mother        lung   Heart disease Father        heart attack    Cancer Father         colon    Medications- reviewed and updated Current Outpatient Medications  Medication Sig Dispense Refill   Ascorbic Acid (VITAMIN C) 1000 MG tablet Take 1,000 mg by mouth daily.     aspirin 81 MG chewable tablet Chew 1 tablet (81 mg total) by mouth 2 (two) times daily. 35 tablet 0   azelastine (ASTELIN) 0.1 % nasal spray Place 1 spray into both nostrils 2 (two) times daily. Use in each nostril as directed     B Complex-C (SUPER B COMPLEX PO) Take 1 tablet by mouth daily.     brimonidine (ALPHAGAN) 0.2 % ophthalmic solution Place 1 drop into the right eye in the morning and at bedtime.     fluticasone (FLONASE) 50 MCG/ACT nasal spray Place 1 spray into both nostrils daily as needed for allergies.     HYDROcodone-acetaminophen (NORCO/VICODIN) 5-325 MG tablet Take 1 tablet by mouth every 6 (six) hours as needed for moderate pain. 30 tablet  0   latanoprost (XALATAN) 0.005 % ophthalmic solution Place 1 drop into the right eye at bedtime.     Multiple Vitamins-Minerals (MULTIVITAMINS THER. W/MINERALS) TABS Take 1 tablet by mouth daily.     nitroGLYCERIN (NITROSTAT) 0.4 MG SL tablet Place 1 tablet (0.4 mg total) under the tongue every 5 (five) minutes as needed for chest pain. Do not exceed more than 3 doses per day. 25 tablet 11   Propylene Glycol (SYSTANE BALANCE) 0.6 % SOLN Place 1 drop into both eyes 3 (three) times daily as needed (dry eyes).     tiZANidine (ZANAFLEX) 4 MG tablet Take 1 tablet (4 mg total) by mouth every 8 (eight) hours as needed for muscle spasms. 30 tablet 0   vitamin E 180 MG (400 UNITS) capsule Take 400 Units by mouth daily.     No current facility-administered medications for this visit.    Allergies-reviewed and updated Allergies  Allergen Reactions   Sumatriptan Shortness Of Breath    REACTION: sob, headache scalp sensitivity   Penicillins Itching   Oxycodone Itching    Social History   Socioeconomic History   Marital status: Married    Spouse name:  Not on file   Number of children: Not on file   Years of education: Not on file   Highest education level: 12th grade  Occupational History    Comment: Retired -[employer]  Tobacco Use   Smoking status: Former    Packs/day: 2.00    Years: 20.00    Pack years: 40.00    Types: Cigarettes    Quit date: 11/14/1982    Years since quitting: 38.5   Smokeless tobacco: Never  Vaping Use   Vaping Use: Never used  Substance and Sexual Activity   Alcohol use: Yes    Alcohol/week: 6.0 standard drinks    Types: 6 Cans of beer per week    Comment: ocass.   Drug use: No   Sexual activity: Not Currently  Other Topics Concern   Not on file  Social History Narrative   Pt lives with his wife. 1 and a half story home  No issues with stairs. 12th grade education   Social Determinants of Health   Financial Resource Strain: Not on file  Food Insecurity: No Food Insecurity   Worried About Charity fundraiser in the Last Year: Never true   Ran Out of Food in the Last Year: Never true  Transportation Needs: No Transportation Needs   Lack of Transportation (Medical): No   Lack of Transportation (Non-Medical): No  Physical Activity: Not on file  Stress: Not on file  Social Connections: Not on file        Objective:  Physical Exam: BP 128/72   Pulse 75   Temp (!) 97.2 F (36.2 C) (Temporal)   Ht '6\' 1"'$  (1.854 m)   Wt 208 lb (94.3 kg)   SpO2 97%   BMI 27.44 kg/m   Body mass index is 27.44 kg/m. Wt Readings from Last 3 Encounters:  06/09/21 208 lb (94.3 kg)  05/14/21 216 lb (98 kg)  05/07/21 216 lb (98 kg)   Gen: NAD, resting comfortably HEENT: TMs normal bilaterally. OP clear. No thyromegaly noted.  CV: RRR with no murmurs appreciated Pulm: NWOB, CTAB with no crackles, wheezes, or rhonchi GI: Normal bowel sounds present. Soft, Nontender, Nondistended. MSK: no edema, cyanosis, or clubbing noted Skin: warm, dry Neuro: CN2-12 grossly intact. Strength 5/5 in upper and lower extremities.  Reflexes symmetric and  intact bilaterally.  Psych: Normal affect and thought content     I,Jordan Kelly,acting as a scribe for Dimas Chyle, MD.,have documented all relevant documentation on the behalf of Dimas Chyle, MD,as directed by  Dimas Chyle, MD while in the presence of Dimas Chyle, MD.  I, Dimas Chyle, MD, have reviewed all documentation for this visit. The documentation on 06/09/21 for the exam, diagnosis, procedures, and orders are all accurate and complete.  Algis Greenhouse. Jerline Pain, MD 06/09/2021 9:41 AM

## 2021-06-09 NOTE — Assessment & Plan Note (Signed)
Check A1c.  Not currently on any medications. 

## 2021-06-09 NOTE — Assessment & Plan Note (Signed)
At goal on metoprolol tartrate 12.5 mg daily.

## 2021-06-10 ENCOUNTER — Other Ambulatory Visit: Payer: Self-pay | Admitting: Family Medicine

## 2021-06-11 ENCOUNTER — Other Ambulatory Visit: Payer: Self-pay

## 2021-06-11 DIAGNOSIS — E1165 Type 2 diabetes mellitus with hyperglycemia: Secondary | ICD-10-CM

## 2021-06-11 NOTE — Progress Notes (Signed)
Please inform patient of the following:  A1c is borderline elevated compared to previous values.  All of his other labs are normal.  We should recheck this again in 6 months.  Please ask him to schedule follow-up appointment at that time.  Do not need to start meds.  He should continue working on diet and exercise.

## 2021-06-14 ENCOUNTER — Telehealth: Payer: Self-pay | Admitting: *Deleted

## 2021-06-14 NOTE — Telephone Encounter (Signed)
30 day Ortho bundle call completed.

## 2021-06-14 NOTE — Telephone Encounter (Signed)
Attempted Ortho bundle 30 day call; no answer and left VM.

## 2021-06-21 ENCOUNTER — Telehealth: Payer: Self-pay

## 2021-06-21 ENCOUNTER — Other Ambulatory Visit: Payer: Self-pay

## 2021-06-21 DIAGNOSIS — G43109 Migraine with aura, not intractable, without status migrainosus: Secondary | ICD-10-CM

## 2021-06-21 NOTE — Telephone Encounter (Signed)
Ok with me. Please place any necessary orders. 

## 2021-06-21 NOTE — Telephone Encounter (Signed)
Referral has been sent.

## 2021-06-21 NOTE — Telephone Encounter (Signed)
Ok to place referral? Please advise

## 2021-06-21 NOTE — Telephone Encounter (Signed)
Patient is calling in wondering if he can get a referral to [place] for his migraine.

## 2021-06-23 ENCOUNTER — Ambulatory Visit (INDEPENDENT_AMBULATORY_CARE_PROVIDER_SITE_OTHER): Payer: Medicare HMO | Admitting: Orthopaedic Surgery

## 2021-06-23 ENCOUNTER — Other Ambulatory Visit: Payer: Self-pay

## 2021-06-23 DIAGNOSIS — Z96642 Presence of left artificial hip joint: Secondary | ICD-10-CM

## 2021-06-23 DIAGNOSIS — M4807 Spinal stenosis, lumbosacral region: Secondary | ICD-10-CM

## 2021-06-23 NOTE — Progress Notes (Signed)
The patient is now 6 weeks status post a left total hip arthroplasty.  He is starting to make progress in terms of decreasing pain with his left hip and improving his mobility.  He still has a significant amount of groin pain.  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  That is giving him some problems in terms of sciatica.  He is now walking without an assistive device.  He is an active 75 year old gentleman.  His biggest problem now is pain in the groin but also pain around the ischial and sciatic region when he drives long distances.  He does tolerate putting his left hip through internal extra rotation better.  He still walks with a slight limp when he first gets up.  He does seem to be irritated with a straight leg raise on the left side but no significant radicular symptoms other than the pain he is having that does radiate into the sciatic region.  The MRI of his lumbar spine does show an extraforaminal disc protrusion to the left-sided L4-L5.  This may be the source of his discomfort.  This point I would like to send him to Dr. Ernestina Patches for an epidural steroid injection to the left-sided L4-L5 to see if this will help with his other symptoms based on the MRI findings and his clinical exam findings.  I will see him back myself in 4 weeks.  All questions and concerns were answered and addressed.  He agrees with this treatment plan.

## 2021-06-25 NOTE — Telephone Encounter (Signed)
PATIENT STATS THE OFFICE DID NOT RECEIVE THE REFERRAL CAN WE RE SEND PLEASE

## 2021-06-28 NOTE — Telephone Encounter (Signed)
PLEASE REFER BACK TO THE REFERRAL TAB. REFERRAL WAS SUBMITTED 06/24/2021. PT WAS TOLD TO ALLOW 7-10 BUSINESS DAYS FOR SCHEDULING.

## 2021-07-07 ENCOUNTER — Telehealth: Payer: Medicare HMO

## 2021-07-12 ENCOUNTER — Encounter: Payer: Self-pay | Admitting: Physical Medicine and Rehabilitation

## 2021-07-12 ENCOUNTER — Other Ambulatory Visit: Payer: Self-pay

## 2021-07-12 ENCOUNTER — Ambulatory Visit: Payer: Medicare HMO | Admitting: Physical Medicine and Rehabilitation

## 2021-07-12 ENCOUNTER — Ambulatory Visit: Payer: Self-pay

## 2021-07-12 VITALS — BP 129/73 | HR 80

## 2021-07-12 DIAGNOSIS — M5416 Radiculopathy, lumbar region: Secondary | ICD-10-CM

## 2021-07-12 MED ORDER — METHYLPREDNISOLONE ACETATE 80 MG/ML IJ SUSP
80.0000 mg | Freq: Once | INTRAMUSCULAR | Status: AC
  Administered 2021-07-12: 80 mg

## 2021-07-12 NOTE — Progress Notes (Signed)
Robert [name] - 75 y.o. male MRN [id number]  Date of birth: 08-26-1946  Office Visit Note: Visit Date: 07/12/2021 PCP: Vivi Barrack, MD Referred by: Vivi Barrack, MD  Subjective: Chief Complaint  Patient presents with   Lower Back - Pain, Numbness   HPI:  Robert [name] is a 75 y.o. male who comes in today at the request of Dr. Jean Rosenthal for planned Left L4-L5 Lumbar Transforaminal epidural steroid injection with fluoroscopic guidance.  The patient has failed conservative care including home exercise, medications, time and activity modification.  This injection will be diagnostic and hopefully therapeutic.  Please see requesting physician notes for further details and justification. MRI reviewed with images and spine model.  MRI reviewed in the note below.     ROS Otherwise per HPI.  Assessment & Plan: Visit Diagnoses:    ICD-10-CM   1. Lumbar radiculopathy  M54.16 XR C-ARM NO REPORT    Epidural Steroid injection    methylPREDNISolone acetate (DEPO-MEDROL) injection 80 mg      Plan: No additional findings.   Meds & Orders:  Meds ordered this encounter  Medications   methylPREDNISolone acetate (DEPO-MEDROL) injection 80 mg    Orders Placed This Encounter  Procedures   XR C-ARM NO REPORT   Epidural Steroid injection    Follow-up: Return for visit to requesting physician as needed.   Procedures: No procedures performed  Lumbosacral Transforaminal Epidural Steroid Injection - Sub-Pedicular Approach with Fluoroscopic Guidance  Patient: Robert [name]      Date of Birth: 1946/05/08 MRN: [id number] PCP: Vivi Barrack, MD      Visit Date: 07/12/2021   Universal Protocol:    Date/Time: 07/12/2021  Consent Given By: the patient  Position: PRONE  Additional Comments: Vital signs were monitored before and after the procedure. Patient was prepped and draped in the usual sterile fashion. The correct patient, procedure, and site was verified.   Injection  Procedure Details:   Procedure diagnoses: Lumbar radiculopathy [M54.16]    Meds Administered:  Meds ordered this encounter  Medications   methylPREDNISolone acetate (DEPO-MEDROL) injection 80 mg    Laterality: Left  Location/Site:  L4-L5  Needle:5.0 in., 22 ga.  Short bevel or Quincke spinal needle  Needle Placement: Transforaminal  Findings:    -Comments: Excellent flow of contrast along the nerve, nerve root and into the epidural space.  Procedure Details: After squaring off the end-plates to get a true AP view, the C-arm was positioned so that an oblique view of the foramen as noted above was visualized. The target area is just inferior to the "nose of the scotty dog" or sub pedicular. The soft tissues overlying this structure were infiltrated with 2-3 ml. of 1% Lidocaine without Epinephrine.  The spinal needle was inserted toward the target using a "trajectory" view along the fluoroscope beam.  Under AP and lateral visualization, the needle was advanced so it did not puncture dura and was located close the 6 O'Clock position of the pedical in AP tracterory. Biplanar projections were used to confirm position. Aspiration was confirmed to be negative for CSF and/or blood. A 1-2 ml. volume of Isovue-250 was injected and flow of contrast was noted at each level. Radiographs were obtained for documentation purposes.   After attaining the desired flow of contrast documented above, a 0.5 to 1.0 ml test dose of 0.25% Marcaine was injected into each respective transforaminal space.  The patient was observed for 90 seconds post injection.  After  no sensory deficits were reported, and normal lower extremity motor function was noted,   the above injectate was administered so that equal amounts of the injectate were placed at each foramen (level) into the transforaminal epidural space.   Additional Comments:  The patient tolerated the procedure well Dressing: 2 x 2 sterile gauze and Band-Aid     Post-procedure details: Patient was observed during the procedure. Post-procedure instructions were reviewed.  Patient left the clinic in stable condition.    Clinical History: MRI LUMBAR SPINE WITHOUT CONTRAST   TECHNIQUE: Multiplanar, multisequence MR imaging of the lumbar spine was performed. No intravenous contrast was administered.   COMPARISON:  CT lumbar spine dated April 18, 2018.   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   Alignment:  Physiologic.   Vertebrae:  No fracture, evidence of discitis, or bone lesion.   Conus medullaris and cauda equina: Conus extends to the L1 level. Conus and cauda equina appear normal.   Paraspinal and other soft tissues: Negative.   Disc levels:   T12-L1:  Negative.   L1-L2:  Negative.   L2-L3:  Minimal disc bulging.  No stenosis.   L3-L4: Mild disc bulging and bilateral facet arthropathy. Mild bilateral lateral recess stenosis. No spinal canal or neuroforaminal stenosis.   L4-L5: Mild disc bulging and endplate spurring with superimposed small left extraforaminal disc protrusion encroaching on the exiting left L4 nerve root. Mild bilateral facet arthropathy. Mild left lateral recess stenosis. No spinal canal or neuroforaminal stenosis.   L5-S1: Small circumferential disc osteophyte complex. Mild bilateral facet arthropathy. Mild bilateral neuroforaminal stenosis. No spinal canal stenosis.   IMPRESSION: 1. Mild multilevel degenerative changes of the lumbar spine as described above. Small left extraforaminal disc protrusion at L4-L5 encroaching on the exiting left L4 nerve root.     Electronically Signed   By: Titus Dubin M.D.   On: 04/24/2021 18:12     Objective:  VS:  HT:    WT:   BMI:     BP:129/73  HR:80bpm  TEMP: ( )  RESP:  Physical Exam Vitals and nursing note reviewed.  Constitutional:      General: He is not in acute distress.    Appearance:  Normal appearance. He is not ill-appearing.  HENT:     Head: Normocephalic and atraumatic.     Right Ear: External ear normal.     Left Ear: External ear normal.     Nose: No congestion.  Eyes:     Extraocular Movements: Extraocular movements intact.  Cardiovascular:     Rate and Rhythm: Normal rate.     Pulses: Normal pulses.  Pulmonary:     Effort: Pulmonary effort is normal. No respiratory distress.  Abdominal:     General: There is no distension.     Palpations: Abdomen is soft.  Musculoskeletal:        General: No tenderness or signs of injury.     Cervical back: Neck supple.     Right lower leg: No edema.     Left lower leg: No edema.     Comments: Patient has good distal strength without clonus.  Skin:    Findings: No erythema or rash.  Neurological:     General: No focal deficit present.     Mental Status: He is alert and oriented to person, place, and time.     Sensory: No sensory deficit.     Motor: No weakness or abnormal muscle tone.  Coordination: Coordination normal.  Psychiatric:        Mood and Affect: Mood normal.        Behavior: Behavior normal.     Imaging: No results found.

## 2021-07-12 NOTE — Procedures (Signed)
Lumbosacral Transforaminal Epidural Steroid Injection - Sub-Pedicular Approach with Fluoroscopic Guidance  Patient: Robert [name]      Date of Birth: 09/08/1946 MRN: [id number] PCP: Vivi Barrack, MD      Visit Date: 07/12/2021   Universal Protocol:    Date/Time: 07/12/2021  Consent Given By: the patient  Position: PRONE  Additional Comments: Vital signs were monitored before and after the procedure. Patient was prepped and draped in the usual sterile fashion. The correct patient, procedure, and site was verified.   Injection Procedure Details:   Procedure diagnoses: Lumbar radiculopathy [M54.16]    Meds Administered:  Meds ordered this encounter  Medications   methylPREDNISolone acetate (DEPO-MEDROL) injection 80 mg    Laterality: Left  Location/Site: Transitional S1 segment, lumbarized L4-L5  Needle:5.0 in., 22 ga.  Short bevel or Quincke spinal needle  Needle Placement: Transforaminal  Findings:    -Comments: Excellent flow of contrast along the nerve, nerve root and into the epidural space.  Procedure Details: After squaring off the end-plates to get a true AP view, the C-arm was positioned so that an oblique view of the foramen as noted above was visualized. The target area is just inferior to the "nose of the scotty dog" or sub pedicular. The soft tissues overlying this structure were infiltrated with 2-3 ml. of 1% Lidocaine without Epinephrine.  The spinal needle was inserted toward the target using a "trajectory" view along the fluoroscope beam.  Under AP and lateral visualization, the needle was advanced so it did not puncture dura and was located close the 6 O'Clock position of the pedical in AP tracterory. Biplanar projections were used to confirm position. Aspiration was confirmed to be negative for CSF and/or blood. A 1-2 ml. volume of Isovue-250 was injected and flow of contrast was noted at each level. Radiographs were obtained for documentation purposes.    After attaining the desired flow of contrast documented above, a 0.5 to 1.0 ml test dose of 0.25% Marcaine was injected into each respective transforaminal space.  The patient was observed for 90 seconds post injection.  After no sensory deficits were reported, and normal lower extremity motor function was noted,   the above injectate was administered so that equal amounts of the injectate were placed at each foramen (level) into the transforaminal epidural space.   Additional Comments:  The patient tolerated the procedure well Dressing: 2 x 2 sterile gauze and Band-Aid    Post-procedure details: Patient was observed during the procedure. Post-procedure instructions were reviewed.  Patient left the clinic in stable condition.

## 2021-07-12 NOTE — Progress Notes (Signed)
Pt state lower back pain that travels to his buttocks. Pt state sitting in his car pt feels a sharp pain and sum numbness late. Pt state he doesn't take anything and just change his position.  Numeric Pain Rating Scale and Functional Assessment Average Pain 4   In the last MONTH (on 0-10 scale) has pain interfered with the following?  1. General activity like being  able to carry out your everyday physical activities such as walking, climbing stairs, carrying groceries, or moving a chair?  Rating(9)   +Driver, -BT, -Dye Allergies.

## 2021-07-12 NOTE — Patient Instructions (Signed)

## 2021-07-13 ENCOUNTER — Ambulatory Visit (INDEPENDENT_AMBULATORY_CARE_PROVIDER_SITE_OTHER): Payer: Medicare HMO | Admitting: Pharmacist

## 2021-07-13 DIAGNOSIS — E785 Hyperlipidemia, unspecified: Secondary | ICD-10-CM

## 2021-07-13 DIAGNOSIS — I152 Hypertension secondary to endocrine disorders: Secondary | ICD-10-CM

## 2021-07-13 DIAGNOSIS — E1165 Type 2 diabetes mellitus with hyperglycemia: Secondary | ICD-10-CM

## 2021-07-13 DIAGNOSIS — E1169 Type 2 diabetes mellitus with other specified complication: Secondary | ICD-10-CM

## 2021-07-13 DIAGNOSIS — E1159 Type 2 diabetes mellitus with other circulatory complications: Secondary | ICD-10-CM | POA: Diagnosis not present

## 2021-07-13 NOTE — Progress Notes (Signed)
Chronic Care Management Pharmacy Note  07/13/2021 Name:  Robert [name] MRN:  [id number] DOB:  [birth year]  Summary: PharmD follow up.  Discussed diet choices to lower A1c.  Encouraged regular exercise.  All meds reviewed and updated.  Recommendations/Changes made from today's visit: Lifestyle mods to improve A1c.  Plan: FU 6 months   Subjective: Robert [name] is an 75 y.o. year old male who is a primary patient of Robert [name], Robert Greenhouse, MD.  The CCM team was consulted for assistance with disease management and care coordination needs.    Engaged with patient by telephone for follow up visit in response to provider referral for pharmacy case management and/or care coordination services.   Consent to Services:  The patient was given the following information about Chronic Care Management services today, agreed to services, and gave verbal consent: 1. CCM service includes personalized support from designated clinical staff supervised by the primary care provider, including individualized plan of care and coordination with other care providers 2. 24/7 contact phone numbers for assistance for urgent and routine care needs. 3. Service will only be billed when office clinical staff spend 20 minutes or more in a month to coordinate care. 4. Only one practitioner may furnish and bill the service in a calendar month. 5.The patient may stop CCM services at any time (effective at the end of the month) by phone call to the office staff. 6. The patient will be responsible for cost sharing (co-pay) of up to 20% of the service fee (after annual deductible is met). Patient agreed to services and consent obtained.  Patient Care Team: Robert Barrack, MD as PCP - General (Family Medicine) [place] as Referring Physician ([place]) Jovita Gamma, MD as Consulting Physician (Neurosurgery) Martinique, Peter M, MD as Consulting Physician (Cardiology) Marica Otter, OD (Optometry) Edythe Clarity,  [facility] (Pharmacist)   Objective:  Lab Results  Component Value Date   CREATININE 0.83 06/09/2021   BUN 12 06/09/2021   GFR 84.42 06/09/2021   GFRNONAA >60 05/15/2021   GFRAA 92 06/09/2020   NA 136 06/09/2021   K 4.7 06/09/2021   CALCIUM 9.6 06/09/2021   CO2 28 06/09/2021   GLUCOSE 146 (H) 06/09/2021    Lab Results  Component Value Date/Time   HGBA1C 7.3 (H) 06/09/2021 09:48 AM   HGBA1C 6.8 (A) 12/10/2020 09:41 AM   HGBA1C 6.9 (H) 06/09/2020 10:54 AM   GFR 84.42 06/09/2021 09:48 AM   GFR 83.16 07/05/2014 11:43 AM   MICROALBUR <0.7 06/09/2021 09:48 AM   MICROALBUR <0.7 05/23/2018 09:12 AM    Last diabetic Eye exam: No results found for: HMDIABEYEEXA  Last diabetic Foot exam: No results found for: HMDIABFOOTEX   Lab Results  Component Value Date   CHOL 128 06/09/2021   HDL 54.60 06/09/2021   LDLCALC 48 06/09/2021   LDLDIRECT 137.0 03/31/2010   TRIG 124.0 06/09/2021   CHOLHDL 2 06/09/2021    Hepatic Function Latest Ref Rng & Units 06/09/2021 06/09/2020 03/14/2018  Total Protein 6.0 - 8.3 g/dL 7.1 6.6 7.1  Albumin 3.5 - 5.2 g/dL 4.0 4.6 4.7  AST 0 - 37 U/L $Remo'20 24 26  'lAsmb$ ALT 0 - 53 U/L $Remo'22 28 27  'wqLFm$ Alk Phosphatase 39 - 117 U/L 100 63 65  Total Bilirubin 0.2 - 1.2 mg/dL 0.5 0.4 0.5  Bilirubin, Direct 0.00 - 0.40 mg/dL - 0.14 0.15    Lab Results  Component Value Date/Time   TSH 1.93 06/09/2021 09:48 AM  TSH 1.70 07/05/2014 11:43 AM    CBC Latest Ref Rng & Units 06/09/2021 05/15/2021 05/07/2021  WBC 4.0 - 10.5 K/uL 6.8 12.3(H) 8.0  Hemoglobin 13.0 - 17.0 g/dL 11.9(L) 11.9(L) 14.1  Hematocrit 39.0 - 52.0 % 36.3(L) 36.1(L) 42.8  Platelets 150.0 - 400.0 K/uL 236.0 158 166    No results found for: VD25OH  Clinical ASCVD: No  The ASCVD Risk score Robert [name] DC Jr., et al., 2013) failed to calculate for the following reasons:   The valid total cholesterol range is 130 to 320 mg/dL    Depression screen [facility] 2/9 06/09/2021 12/10/2020 11/27/2019  Decreased Interest 0 0 2  Down,  Depressed, Hopeless 0 0 1  PHQ - 2 Score 0 0 3  Altered sleeping - - 1  Tired, decreased energy - - 1  Change in appetite - - 0  Feeling bad or failure about yourself  - - 0  Trouble concentrating - - 1  Moving slowly or fidgety/restless - - 0  Suicidal thoughts - - 0  PHQ-9 Score - - 6  Difficult doing work/chores - - Somewhat difficult      Social History   Tobacco Use  Smoking Status Former   Packs/day: 2.00   Years: 20.00   Pack years: 40.00   Types: Cigarettes   Quit date: 11/14/1982   Years since quitting: 38.6  Smokeless Tobacco Never   BP Readings from Last 3 Encounters:  07/12/21 129/73  06/09/21 128/72  05/15/21 121/60   Pulse Readings from Last 3 Encounters:  07/12/21 80  06/09/21 75  05/15/21 78   Wt Readings from Last 3 Encounters:  06/09/21 208 lb (94.3 kg)  05/14/21 216 lb (98 kg)  05/07/21 216 lb (98 kg)   BMI Readings from Last 3 Encounters:  06/09/21 27.44 kg/m  05/14/21 28.50 kg/m  05/07/21 28.50 kg/m    Assessment/Interventions: Review of patient past medical history, allergies, medications, health status, including review of consultants reports, laboratory and other test data, was performed as part of comprehensive evaluation and provision of chronic care management services.   SDOH:  (Social Determinants of Health) assessments and interventions performed: Yes  Financial Resource Strain: Not on file    SDOH Screenings   Alcohol Screen: Not on file  Depression (PHQ2-9): Low Risk    PHQ-2 Score: 0  Financial Resource Strain: Not on file  Food Insecurity: No Food Insecurity   Worried About Charity fundraiser in the Last Year: Never true   Ran Out of Food in the Last Year: Never true  Housing: Not on file  Physical Activity: Not on file  Social Connections: Not on file  Stress: Not on file  Tobacco Use: Medium Risk   Smoking Tobacco Use: Former   Smokeless Tobacco Use: Never  Transportation Needs: No Forensic scientist (Medical): No   Lack of Transportation (Non-Medical): No    CCM Care Plan  Allergies  Allergen Reactions   Sumatriptan Shortness Of Breath    REACTION: sob, headache scalp sensitivity   Penicillins Itching   Oxycodone Itching    Medications Reviewed Today     Reviewed by Beckie Salts, RMA (Registered Medical Assistant) on 07/12/21 at [phone]  Med List Status: <None>   Medication Order Taking? Sig Documenting Provider Last Dose Status Informant  Ascorbic Acid (VITAMIN C) 1000 MG tablet [id number] Yes Take 1,000 mg by mouth daily. [provider] Taking Active Self  aspirin 81 MG chewable tablet [id number]  Yes Chew 1 tablet (81 mg total) by mouth 2 (two) times daily. Pete Pelt, PA-C Taking Active   azelastine (ASTELIN) 0.1 % nasal spray [id number] Yes Place 1 spray into both nostrils 2 (two) times daily. Use in each nostril as directed [provider] Taking Active Self  B Complex-C (SUPER B COMPLEX PO) [id number] Yes Take 1 tablet by mouth daily. [provider] Taking Active Self  brimonidine (ALPHAGAN) 0.2 % ophthalmic solution [id number] Yes Place 1 drop into the right eye in the morning and at bedtime. [provider] Taking Active Self  fluticasone (FLONASE) 50 MCG/ACT nasal spray [id number] Yes Place 1 spray into both nostrils daily as needed for allergies. [provider] Taking Active Self  HYDROcodone-acetaminophen (NORCO/VICODIN) 5-325 MG tablet [id number] Yes Take 1 tablet by mouth every 6 (six) hours as needed for moderate [name]. Mcarthur Rossetti, MD Taking Active   latanoprost (XALATAN) 0.005 % ophthalmic solution [id number] Yes Place 1 drop into the right eye at bedtime. [provider] Taking Active Self  Multiple Vitamins-Minerals (MULTIVITAMINS THER. W/MINERALS) Sheral Flow [id number] Yes Take 1 tablet by mouth daily. [provider] Taking Active Self  nitroGLYCERIN (NITROSTAT) 0.4 MG SL tablet  [id number] Yes Place 1 tablet (0.4 mg total) under the tongue every 5 (five) minutes as needed for chest [name]. Do not exceed more than 3 doses per day. Martinique, Peter M, MD Taking Active   Propylene Glycol (SYSTANE BALANCE) 0.6 % SOLN [id number] Yes Place 1 drop into both eyes 3 (three) times daily as needed (dry eyes). [provider] Taking Active Self  tiZANidine (ZANAFLEX) 4 MG tablet [id number] Yes Take 1 tablet (4 mg total) by mouth every 8 (eight) hours as needed for muscle spasms. Mcarthur Rossetti, MD Taking Active   vitamin E 180 MG (400 UNITS) capsule [id number] Yes Take 400 Units by mouth daily. [provider] Taking Active Self            Patient Active Problem List   Diagnosis Date Noted   Status post total replacement of left hip 05/14/2021   Type 2 diabetes mellitus with hyperglycemia ([place]) 05/23/2018   Chronically dry eyes, bilateral 05/23/2018   Migraines 11/28/2016   Hypertension associated with diabetes ([place]) 10/11/2011   History of basal cell carcinoma (BCC), right ear 03/03/2009   Hyperlipidemia associated with type 2 diabetes mellitus ([place]) 01/11/2008   Allergic rhinitis 01/07/2008   Prostatitis, recurrent 01/07/2008   History of gastric ulcer 01/07/2008   History of colon polyps, followed by Dr. Earlean Shawl 01/07/2008   GERD 04/02/2007   Sleep apnea 04/02/2007    Immunization History  Administered Date(s) Administered   Fluad Quad(high Dose 65+) 10/06/2020   Influenza Whole 11/14/2005   Influenza, High Dose Seasonal PF 08/10/2012, 09/12/2013, 08/28/2015, 10/19/2016, 09/18/2017, 10/21/2017, 09/12/2018, 09/24/2019   Influenza-Unspecified 09/14/2010, 09/15/2011, 07/15/2013, 10/01/2014, 10/14/2016   Moderna Sars-Covid-2 Vaccination 11/29/2019, 12/27/2019, 09/01/2020   Pneumococcal Conjugate-13 05/06/2014   Pneumococcal Polysaccharide-23 03/03/2009   Pneumococcal-Unspecified 05/14/2010   Td 10/14/2006   Tdap 11/14/2009, 11/30/2017   Zoster  Recombinat (Shingrix) 08/30/2017, 02/12/2018, 04/14/2018   Zoster, Live 10/28/2009    Conditions to be addressed/monitored:  HTN, DM, HLD  Care Plan : General Pharmacy (Adult)  Updates made by Edythe Clarity, RPH since 07/13/2021 12:00 AM     Problem: HTN, HLD, DM   Priority: High  Onset Date: 07/13/2021     Long-Range Goal: Patient-Specific Goal   Start Date: 07/13/2021  Expected End Date: 01/11/2022  This Visit's Progress: On track  Priority: High  Note:   Current Barriers:  Elevated A1c  Pharmacist Clinical Goal(s):  Patient will achieve control of A1c as evidenced by repeat labs through collaboration with PharmD and provider.   Interventions: 1:1 collaboration with Robert Barrack, MD regarding development and update of comprehensive plan of care as evidenced by provider attestation and co-signature Inter-disciplinary care team collaboration (see longitudinal plan of care) Comprehensive medication review performed; medication list updated in electronic medical record  Hypertension (BP goal <130/80) -Controlled -Current treatment: Metoprolol 12.5mg  daily -Medications previously tried: none noted  -Current home readings: all WNL per patient -Current dietary habits: working on cutting back on sugars and carbs -Current exercise habits: "very active" likes to play golf a few times per week -Denies hypotensive/hypertensive symptoms -Educated on BP goals and benefits of medications for prevention of heart attack, stroke and kidney damage; Exercise goal of 150 minutes per week; Symptoms of hypotension and importance of maintaining adequate hydration; -Counseled to monitor BP at home periodically, document, and provide log at future appointments -Recommended to continue current medication  Hyperlipidemia: (LDL goal < 70) -Controlled -Current treatment: Rosuvastatin 40 mg once daily Zetia 10 mg once daily -Medications previously tried: none noted  -Current dietary  patterns: see above -Current exercise habits: see above -Educated on Cholesterol goals;  Benefits of statin for ASCVD risk reduction; Reviewed most recent lipid panel -Recommended to continue current medication  Diabetes (A1c goal <7%) -Not ideally controlled -Current medications: None at this time -Medications previously tried: none  -Current home glucose readings fasting glucose: not checking post prandial glucose: not checking -Denies hypoglycemic/hyperglycemic symptoms -Current exercise: active lifestyle, likes to play golf but has gotten out of it lately due to the heat -Educated on A1c and blood sugar goals; Carbohydrate counting and/or plate method  -Counseled to check feet daily and get yearly eye exams -Counseled on diet and exercise extensively Recommended to work on lifestyle mods, will recheck A1c in approx 6 months  Patient Goals/Self-Care Activities Patient will:  - take medications as prescribed focus on medication adherence by pill box engage in dietary modifications by limiting dietary carbs and sugars  Follow Up Plan: The care management team will reach out to the patient again over the next 180 days.          Medication Assistance: None required.  Patient affirms current coverage meets needs.  Compliance/Adherence/Medication fill history: Care Gaps: Foot exam   Patient's preferred pharmacy is:  [id number] - [place], [place] [address] [place] [address] Phone: [phone] Fax: [phone]  [facility] [address], [place] - [address] [place] [place] [address] Phone: [phone] Fax: [phone]  [facility] [place], [place] [address] Phone: [phone] Fax: [phone]  We discussed: Current pharmacy is preferred with insurance plan and patient is satisfied with pharmacy services Patient  decided to: Utilize [facility] for medication synchronization, packaging and delivery  Care Plan and Follow Up Patient Decision:  Patient agrees to Care Plan and Follow-up.  Plan: The care management team will reach out to the patient again over the next 180 days.  Beverly Milch, PharmD Clinical Pharmacist  [phone]

## 2021-07-13 NOTE — Patient Instructions (Addendum)
Visit Information   Goals Addressed             This Visit's Progress    Diabetes-Lifestyle Change       Timeframe:  Long-Range Goal Priority:  High Start Date: 07/13/21                            Expected End Date: 02/30/23                       Follow Up Date 10/13/21    - agree to work together to make changes - dietary modifications to lower sugar    Why is this important?   The changes that you are asked to make may be hard to do.  This is especially true when the changes are life-long.  Knowing why it is important to you is the first step.  Working on the change with your family or support person helps you not feel alone.  Reward yourself and family or support person when goals are met. This can be an activity you choose like bowling, hiking, biking, swimming or shooting hoops.     Notes:        Patient Care Plan: General Pharmacy (Adult)     Problem Identified: HTN, HLD, DM   Priority: High  Onset Date: 07/13/2021     Long-Range Goal: Patient-Specific Goal   Start Date: 07/13/2021  Expected End Date: 01/11/2022  This Visit's Progress: On track  Priority: High  Note:   Current Barriers:  Elevated A1c  Pharmacist Clinical Goal(s):  Patient will achieve control of A1c as evidenced by repeat labs through collaboration with PharmD and provider.   Interventions: 1:1 collaboration with Vivi Barrack, MD regarding development and update of comprehensive plan of care as evidenced by provider attestation and co-signature Inter-disciplinary care team collaboration (see longitudinal plan of care) Comprehensive medication review performed; medication list updated in electronic medical record  Hypertension (BP goal <130/80) -Controlled -Current treatment: Metoprolol 12.5mg  daily -Medications previously tried: none noted  -Current home readings: all WNL per patient -Current dietary habits: working on cutting back on sugars and carbs -Current exercise habits:  "very active" likes to play golf a few times per week -Denies hypotensive/hypertensive symptoms -Educated on BP goals and benefits of medications for prevention of heart attack, stroke and kidney damage; Exercise goal of 150 minutes per week; Symptoms of hypotension and importance of maintaining adequate hydration; -Counseled to monitor BP at home periodically, document, and provide log at future appointments -Recommended to continue current medication  Hyperlipidemia: (LDL goal < 70) -Controlled -Current treatment: Rosuvastatin 40 mg once daily Zetia 10 mg once daily -Medications previously tried: none noted  -Current dietary patterns: see above -Current exercise habits: see above -Educated on Cholesterol goals;  Benefits of statin for ASCVD risk reduction; Reviewed most recent lipid panel -Recommended to continue current medication  Diabetes (A1c goal <7%) -Not ideally controlled -Current medications: None at this time -Medications previously tried: none  -Current home glucose readings fasting glucose: not checking post prandial glucose: not checking -Denies hypoglycemic/hyperglycemic symptoms -Current exercise: active lifestyle, likes to play golf but has gotten out of it lately due to the heat -Educated on A1c and blood sugar goals; Carbohydrate counting and/or plate method  -Counseled to check feet daily and get yearly eye exams -Counseled on diet and exercise extensively Recommended to work on lifestyle mods, will recheck A1c in approx 6 months  Patient Goals/Self-Care Activities Patient will:  - take medications as prescribed focus on medication adherence by pill box engage in dietary modifications by limiting dietary carbs and sugars  Follow Up Plan: The care management team will reach out to the patient again over the next 180 days.         Patient verbalizes understanding of instructions provided today and agrees to view in [place].  Telephone follow up  appointment with pharmacy team member scheduled for:  Edythe Clarity, [place]

## 2021-07-16 NOTE — Telephone Encounter (Addendum)
Looks like patient has been scheduled.  Robert [name], Can you look into this?

## 2021-07-27 ENCOUNTER — Encounter: Payer: Medicare HMO | Admitting: Orthopaedic Surgery

## 2021-07-27 DIAGNOSIS — H40012 Open angle with borderline findings, low risk, left eye: Secondary | ICD-10-CM | POA: Diagnosis not present

## 2021-07-27 DIAGNOSIS — H59031 Cystoid macular edema following cataract surgery, right eye: Secondary | ICD-10-CM | POA: Diagnosis not present

## 2021-08-04 ENCOUNTER — Encounter: Payer: Self-pay | Admitting: Orthopaedic Surgery

## 2021-08-04 ENCOUNTER — Other Ambulatory Visit: Payer: Self-pay

## 2021-08-04 ENCOUNTER — Ambulatory Visit (INDEPENDENT_AMBULATORY_CARE_PROVIDER_SITE_OTHER): Payer: Medicare HMO | Admitting: Orthopaedic Surgery

## 2021-08-04 DIAGNOSIS — Z96642 Presence of left artificial hip joint: Secondary | ICD-10-CM

## 2021-08-04 DIAGNOSIS — M4807 Spinal stenosis, lumbosacral region: Secondary | ICD-10-CM

## 2021-08-04 NOTE — Progress Notes (Signed)
The patient comes in today at 11 weeks status post a left total hip arthroplasty.  Back on August 29, Dr. Ernestina Patches did provide an epidural steroid injection to the left-sided L4-L5.  He says that injection was very helpful.  He states riding in a car is painful and he still has some stiffness in his hip but he is doing well overall.  He is 75 years old and very active.  On exam his left hip is still slightly stiff.  There is a little bit of pain in the groin with rotation and a little bit of pain over the trochanteric area but overall he looks good.  He is walking without assistive device.  He says it is harder to get out of her recliner but overall he is making progress and thus far is happy.  On my standpoint I do not need to see him back for 6 months.  At that visit we will have a standing low AP pelvis and lateral of his left operative hip.  If he has some back issues again and wants to consider a repeat epidural steroid injection, he will give [place] a call if his activity set up directly with Dr. Ernestina Patches.  All questions and concerns were answered and addressed.

## 2021-08-22 NOTE — Progress Notes (Signed)
Robert [name] Date of Birth: [birth year] Medical Record #[id number]  History of Present Illness: [name] is seen today for follow up CAD.  He has had a  NSTEMI with DES to the proximal RCA in November 2012. EF was normal.  Stress Myoview in March 2015 was Normal. 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   He is doing very well on follow up. He denies any chest pain or dyspnea. He still has some limitations from his surgery but this is getting better.    Current Outpatient Medications on File Prior to Visit  Medication Sig Dispense Refill   Ascorbic Acid (VITAMIN C) 1000 MG tablet Take 1,000 mg by mouth daily.     aspirin 81 MG chewable tablet Chew 1 tablet (81 mg total) by mouth 2 (two) times daily. 35 tablet 0   azelastine (ASTELIN) 0.1 % nasal spray Place 1 spray into both nostrils 2 (two) times daily. Use in each nostril as directed     B Complex-C (SUPER B COMPLEX PO) Take 1 tablet by mouth daily.     brimonidine (ALPHAGAN) 0.2 % ophthalmic solution Place 1 drop into the right eye in the morning and at bedtime.     fluticasone (FLONASE) 50 MCG/ACT nasal spray Place 1 spray into both nostrils daily as needed for allergies.     HYDROcodone-acetaminophen (NORCO/VICODIN) 5-325 MG tablet Take 1 tablet by mouth every 6 (six) hours as needed for moderate pain. 30 tablet 0   latanoprost (XALATAN) 0.005 % ophthalmic solution Place 1 drop into the right eye at bedtime.     Multiple Vitamins-Minerals (MULTIVITAMINS THER. W/MINERALS) TABS Take 1 tablet by mouth daily.     pantoprazole (PROTONIX) 40 MG tablet Take 40 mg by mouth daily.     Propylene Glycol (SYSTANE BALANCE) 0.6 % SOLN Place 1 drop into both eyes 3 (three) times daily as needed (dry eyes).     rosuvastatin (CRESTOR) 40 MG tablet Take 40 mg by mouth daily.     tiZANidine (ZANAFLEX) 4 MG tablet Take 1 tablet (4 mg total) by  mouth every 8 (eight) hours as needed for muscle spasms. 30 tablet 0   traMADol (ULTRAM) 50 MG tablet Take 50 mg by mouth daily.     vitamin E 180 MG (400 UNITS) capsule Take 400 Units by mouth daily.     nitroGLYCERIN (NITROSTAT) 0.4 MG SL tablet Place 1 tablet (0.4 mg total) under the tongue every 5 (five) minutes as needed for chest pain. Do not exceed more than 3 doses per day. 25 tablet 11   No current facility-administered medications on file prior to visit.    Allergies  Allergen Reactions   Sumatriptan Shortness Of Breath    REACTION: sob, headache scalp sensitivity   Penicillins Itching   Oxycodone Itching    Past Medical History:  Diagnosis Date   Arthritis    CAD (coronary artery disease) 10/10/2011   NSTEMI with DES to the proximal RCA following flow wire evaluation. EF is normal.    GERD (gastroesophageal reflux disease)    H/O esophageal ulcer    History of stomach ulcers    Hyperlipidemia    Hypertension    Migraines    "quite often; maybe q 10d to 2 wk" (01/14/2014)   Myocardial infarction ([facility]) 10/2011   OSA on CPAP    "wear it most of the time" (  01/14/2014)   Pre-diabetes    Squamous cell cancer of skin of earlobe    "left"   Squamous cell carcinoma 12/17/2016    Past Surgical History:  Procedure Laterality Date   BACK SURGERY     CATARACT EXTRACTION Bilateral 2019   CATARACT EXTRACTION W/ INTRAOCULAR LENS IMPLANT Bilateral 01/2019   CERVICAL DISC SURGERY  1980's?   CORONARY ANGIOPLASTY WITH STENT PLACEMENT  10/13/2011   DES to the proximal RCA. Dr Martinique   FRACTIONAL FLOW RESERVE WIRE Right 10/13/2011   Procedure: [place];  Surgeon: Devron Cohick M Martinique, MD;  Location: [facility] CATH LAB;  Service: Cardiovascular;  Laterality: Right;   HERNIA REPAIR     INGUINAL HERNIA REPAIR  10/23/2012   Procedure: HERNIA REPAIR INGUINAL ADULT;  Surgeon: Odis Hollingshead, MD;  Location: [facility];  Service: General;  Laterality: Left;  Left lower quadrant; left  inguinal hernia repair with mesh   INSERTION OF MESH  10/23/2012   Procedure: INSERTION OF MESH;  Surgeon: Odis Hollingshead, MD;  Location: [place];  Service: General;  Laterality: Left;  Left lower quadrant   LEFT HEART CATHETERIZATION WITH CORONARY ANGIOGRAM N/A 10/13/2011   Procedure: LEFT HEART CATHETERIZATION WITH CORONARY ANGIOGRAM;  Surgeon: Shayan Bramhall M Martinique, MD;  Location[facility] CATH LAB;  Service: Cardiovascular;  Laterality: N/A;   PERCUTANEOUS CORONARY STENT INTERVENTION (PCI-S) Right 10/13/2011   Procedure: PERCUTANEOUS CORONARY STENT INTERVENTION (PCI-S);  Surgeon: Makinzy Cleere M Martinique, MD;  Location: [facility] CATH LAB;  Service: Cardiovascular;  Laterality: Right;   SHOULDER ARTHROSCOPY W/ ROTATOR CUFF REPAIR Left 1990's   TOTAL HIP ARTHROPLASTY Left 05/14/2021   Procedure: LEFT TOTAL HIP ARTHROPLASTY ANTERIOR APPROACH;  Surgeon: Mcarthur Rossetti, MD;  Location: [facility];  Service: Orthopedics;  Laterality: Left;   TRIGGER FINGER RELEASE Left    3rd and 4th digits    Social History   Tobacco Use  Smoking Status Former   Packs/day: 2.00   Years: 20.00   Pack years: 40.00   Types: Cigarettes   Quit date: 11/14/1982   Years since quitting: 38.8  Smokeless Tobacco Never    Social History   Substance and Sexual Activity  Alcohol Use Yes   Alcohol/week: 6.0 standard drinks   Types: 6 Cans of beer per week   Comment: ocass.    Family History  Problem Relation Age of Onset   Cancer Mother        lung   Heart disease Father        heart attack    Cancer Father        colon    Review of Systems: The review of systems is per the HPI.  All other systems were reviewed and are negative.  Physical Exam: BP 120/60 (BP Location: Left Arm)   Pulse (!) 59   Ht 6\' 1"  (1.854 m)   Wt 215 lb 12.8 oz (97.9 kg)   SpO2 96%   BMI 28.47 kg/m  GENERAL:  Well appearing WM in NAD HEENT:  PERRL, EOMI, sclera are clear. Oropharynx is clear. NECK:  No jugular venous distention, carotid upstroke  brisk and symmetric, no bruits, no thyromegaly or adenopathy LUNGS:  Clear to auscultation bilaterally CHEST:  Unremarkable HEART:  RRR,  PMI not displaced or sustained,S1 and S2 within normal limits, no S3, no S4: no clicks, no rubs, no murmurs ABD:  Soft, nontender. BS +, no masses or bruits. No hepatomegaly, no splenomegaly EXT:  2 + pulses throughout, no edema,  no cyanosis no clubbing SKIN:  Warm and dry.  No rashes NEURO:  Alert and oriented x 3. Cranial nerves II through XII intact. PSYCH:  Cognitively intact     LABORATORY DATA:  Lab Results  Component Value Date   WBC 6.8 06/09/2021   HGB 11.9 (L) 06/09/2021   HCT 36.3 (L) 06/09/2021   PLT 236.0 06/09/2021   GLUCOSE 146 (H) 06/09/2021   CHOL 128 06/09/2021   TRIG 124.0 06/09/2021   HDL 54.60 06/09/2021   LDLDIRECT 137.0 03/31/2010   LDLCALC 48 06/09/2021   ALT 22 06/09/2021   AST 20 06/09/2021   NA 136 06/09/2021   K 4.7 06/09/2021   CL 99 06/09/2021   CREATININE 0.83 06/09/2021   BUN 12 06/09/2021   CO2 28 06/09/2021   TSH 1.93 06/09/2021   PSA 0.87 03/31/2010   INR 0.89 01/14/2014   HGBA1C 7.3 (H) 06/09/2021   MICROALBUR <0.7 06/09/2021      Assessment / Plan: 1. Coronary artery disease status post drug-eluting stent to the proximal RCA in November 2012. Myoview March 2015 was normal. He remains asymptomatic. Continue ASA, statin,  metoprolol. Follow up in one year.  2. Hyperlipidemia. Continue crestor and Zetia- LDL excellent 48.   3. Hypertension well controlled.  4. DM A1c 7.3%. per primary care

## 2021-08-25 ENCOUNTER — Ambulatory Visit: Payer: Medicare HMO | Admitting: Cardiology

## 2021-08-25 ENCOUNTER — Other Ambulatory Visit: Payer: Self-pay

## 2021-08-25 ENCOUNTER — Encounter: Payer: Self-pay | Admitting: Cardiology

## 2021-08-25 VITALS — BP 120/60 | HR 59 | Ht 73.0 in | Wt 215.8 lb

## 2021-08-25 DIAGNOSIS — E782 Mixed hyperlipidemia: Secondary | ICD-10-CM

## 2021-08-25 DIAGNOSIS — I10 Essential (primary) hypertension: Secondary | ICD-10-CM

## 2021-08-25 DIAGNOSIS — I2510 Atherosclerotic heart disease of native coronary artery without angina pectoris: Secondary | ICD-10-CM

## 2021-08-27 ENCOUNTER — Ambulatory Visit: Payer: Medicare HMO

## 2021-09-02 ENCOUNTER — Other Ambulatory Visit: Payer: Self-pay

## 2021-09-02 ENCOUNTER — Ambulatory Visit (INDEPENDENT_AMBULATORY_CARE_PROVIDER_SITE_OTHER): Payer: Medicare HMO

## 2021-09-02 VITALS — BP 138/62 | HR 71 | Temp 97.3°F | Wt 215.8 lb

## 2021-09-02 DIAGNOSIS — Z0000 Encounter for general adult medical examination without abnormal findings: Secondary | ICD-10-CM | POA: Diagnosis not present

## 2021-09-02 DIAGNOSIS — Z23 Encounter for immunization: Secondary | ICD-10-CM | POA: Diagnosis not present

## 2021-09-02 NOTE — Patient Instructions (Signed)
Robert [name] , Thank you for taking time to come for your Medicare Wellness Visit. I appreciate your ongoing commitment to your health goals. Please review the following plan we discussed and let me know if I can assist you in the future.   Screening recommendations/referrals: Colonoscopy: Done 01/03/17  Recommended yearly ophthalmology/optometry visit for glaucoma screening and checkup Recommended yearly dental visit for hygiene and checkup  Vaccinations: Influenza vaccine: Completed today at visit 09/02/21 Pneumococcal vaccine: Up to date Tdap vaccine: Done 11/30/17 repeat every 10 years  Shingles vaccine: Completed 08/30/17, 4/1 & 04/14/18   Covid-19: Completed 1/15, 2/12, & 09/01/20  Advanced directives: Please bring a copy of your health care power of attorney and living will to the office at your convenience.  Conditions/risks identified: Stay Alive and well  Next appointment: Follow up in one year for your annual wellness visit.   Preventive Care 28 Years and Older, Male Preventive care refers to lifestyle choices and visits with your health care provider that can promote health and wellness. What does preventive care include? A yearly physical exam. This is also called an annual well check. Dental exams once or twice a year. Routine eye exams. Ask your health care provider how often you should have your eyes checked. Personal lifestyle choices, including: Daily care of your teeth and gums. Regular physical activity. Eating a healthy diet. Avoiding tobacco and drug use. Limiting alcohol use. Practicing safe sex. Taking low doses of aspirin every day. Taking vitamin and mineral supplements as recommended by your health care provider. What happens during an annual well check? The services and screenings done by your health care provider during your annual well check will depend on your age, overall health, lifestyle risk factors, and family history of disease. Counseling  Your  health care provider may ask you questions about your: Alcohol use. Tobacco use. Drug use. Emotional well-being. Home and relationship well-being. Sexual activity. Eating habits. History of falls. Memory and ability to understand (cognition). Work and work Statistician. Screening  You may have the following tests or measurements: Height, weight, and BMI. Blood pressure. Lipid and cholesterol levels. These may be checked every 5 years, or more frequently if you are over 64 years old. Skin check. Lung cancer screening. You may have this screening every year starting at age 74 if you have a 30-pack-year history of smoking and currently smoke or have quit within the past 15 years. Fecal occult blood test (FOBT) of the stool. You may have this test every year starting at age 57. Flexible sigmoidoscopy or colonoscopy. You may have a sigmoidoscopy every 5 years or a colonoscopy every 10 years starting at age 66. Prostate cancer screening. Recommendations will vary depending on your family history and other risks. Hepatitis C blood test. Hepatitis B blood test. Sexually transmitted disease (STD) testing. Diabetes screening. This is done by checking your blood sugar (glucose) after you have not eaten for a while (fasting). You may have this done every 1-3 years. Abdominal aortic aneurysm (AAA) screening. You may need this if you are a current or former smoker. Osteoporosis. You may be screened starting at age 17 if you are at high risk. Talk with your health care provider about your test results, treatment options, and if necessary, the need for more tests. Vaccines  Your health care provider may recommend certain vaccines, such as: Influenza vaccine. This is recommended every year. Tetanus, diphtheria, and acellular pertussis (Tdap, Td) vaccine. You may need a Td booster every 10 years.  Zoster vaccine. You may need this after age 80. Pneumococcal 13-valent conjugate (PCV13) vaccine. One dose  is recommended after age 37. Pneumococcal polysaccharide (PPSV23) vaccine. One dose is recommended after age 20. Talk to your health care provider about which screenings and vaccines you need and how often you need them. This information is not intended to replace advice given to you by your health care provider. Make sure you discuss any questions you have with your health care provider. Document Released: 11/27/2015 Document Revised: 07/20/2016 Document Reviewed: 09/01/2015 Elsevier Interactive Patient Education  2017 [place] Prevention in the Home Falls can cause injuries. They can happen to people of all ages. There are many things you can do to make your home safe and to help prevent falls. What can I do on the outside of my home? Regularly fix the edges of walkways and driveways and fix any cracks. Remove anything that might make you trip as you walk through a door, such as a raised step or threshold. Trim any bushes or trees on the path to your home. Use bright outdoor lighting. Clear any walking paths of anything that might make someone trip, such as rocks or tools. Regularly check to see if handrails are loose or broken. Make sure that both sides of any steps have handrails. Any raised decks and porches should have guardrails on the edges. Have any leaves, snow, or ice cleared regularly. Use sand or salt on walking paths during winter. Clean up any spills in your garage right away. This includes oil or grease spills. What can I do in the bathroom? Use night lights. Install grab bars by the toilet and in the tub and shower. Do not use towel bars as grab bars. Use non-skid mats or decals in the tub or shower. If you need to sit down in the shower, use a plastic, non-slip stool. Keep the floor dry. Clean up any water that spills on the floor as soon as it happens. Remove soap buildup in the tub or shower regularly. Attach bath mats securely with double-sided non-slip rug  tape. Do not have throw rugs and other things on the floor that can make you trip. What can I do in the bedroom? Use night lights. Make sure that you have a light by your bed that is easy to reach. Do not use any sheets or blankets that are too big for your bed. They should not hang down onto the floor. Have a firm chair that has side arms. You can use this for support while you get dressed. Do not have throw rugs and other things on the floor that can make you trip. What can I do in the kitchen? Clean up any spills right away. Avoid walking on wet floors. Keep items that you use a lot in easy-to-reach places. If you need to reach something above you, use a strong step stool that has a grab bar. Keep electrical cords out of the way. Do not use floor polish or wax that makes floors slippery. If you must use wax, use non-skid floor wax. Do not have throw rugs and other things on the floor that can make you trip. What can I do with my stairs? Do not leave any items on the stairs. Make sure that there are handrails on both sides of the stairs and use them. Fix handrails that are broken or loose. Make sure that handrails are as long as the stairways. Check any carpeting to make sure that  it is firmly attached to the stairs. Fix any carpet that is loose or worn. Avoid having throw rugs at the top or bottom of the stairs. If you do have throw rugs, attach them to the floor with carpet tape. Make sure that you have a light switch at the top of the stairs and the bottom of the stairs. If you do not have them, ask someone to add them for you. What else can I do to help prevent falls? Wear shoes that: Do not have high heels. Have rubber bottoms. Are comfortable and fit you well. Are closed at the toe. Do not wear sandals. If you use a stepladder: Make sure that it is fully opened. Do not climb a closed stepladder. Make sure that both sides of the stepladder are locked into place. Ask someone to  hold it for you, if possible. Clearly mark and make sure that you can see: Any grab bars or handrails. First and last steps. Where the edge of each step is. Use tools that help you move around (mobility aids) if they are needed. These include: Canes. Walkers. Scooters. Crutches. Turn on the lights when you go into a dark area. Replace any light bulbs as soon as they burn out. Set up your furniture so you have a clear path. Avoid moving your furniture around. If any of your floors are uneven, fix them. If there are any pets around you, be aware of where they are. Review your medicines with your doctor. Some medicines can make you feel dizzy. This can increase your chance of falling. Ask your doctor what other things that you can do to help prevent falls. This information is not intended to replace advice given to you by your health care provider. Make sure you discuss any questions you have with your health care provider. Document Released: 08/27/2009 Document Revised: 04/07/2016 Document Reviewed: 12/05/2014 Elsevier Interactive Patient Education  2017 [employer].

## 2021-09-02 NOTE — Progress Notes (Addendum)
Subjective:   KAYTON DUNAJ is a 75 y.o. male who presents for Medicare Annual/Subsequent preventive examination.  Review of Systems     Cardiac Risk Factors include: advanced age (>42men, >58 women);hypertension;dyslipidemia;diabetes mellitus;male gender     Objective:    Today's Vitals   09/02/21 0808 09/02/21 0809  BP: 138/62   Pulse: 71   Temp: (!) 97.3 F (36.3 C)   SpO2: 95%   Weight: 215 lb 12.8 oz (97.9 kg)   PainSc:  0-No pain   Body mass index is 28.47 kg/m.  Advanced Directives 09/02/2021 05/14/2021 05/07/2021 03/30/2021 05/16/2018 04/18/2018 05/03/2017  Does Patient Have a Medical Advance Directive? Yes Yes Yes No Yes No Yes  Type of Paramedic of [place];Living will [place];Living will Living will;Healthcare Power of [place];Living will - Living will;Healthcare Power of Attorney  Does patient want to make changes to medical advance directive? - No - Patient declined - - No - Patient declined - -  Copy of [place] in Chart? No - copy requested No - copy requested - - Yes - No - copy requested  Would patient like information on creating a medical advance directive? - - - No - Patient declined - - -  Pre-existing out of facility DNR order (yellow form or pink MOST form) - - - - - - -    Current Medications (verified) Outpatient Encounter Medications as of 09/02/2021  Medication Sig   Ascorbic Acid (VITAMIN C) 1000 MG tablet Take 1,000 mg by mouth daily.   aspirin 81 MG chewable tablet Chew 1 tablet (81 mg total) by mouth 2 (two) times daily.   azelastine (ASTELIN) 0.1 % nasal spray Place 1 spray into both nostrils 2 (two) times daily. Use in each nostril as directed   B Complex-C (SUPER B COMPLEX PO) Take 1 tablet by mouth daily.   brimonidine (ALPHAGAN) 0.2 % ophthalmic solution Place 1 drop into the right eye in the morning and at bedtime.   fluticasone (FLONASE) 50 MCG/ACT  nasal spray Place 1 spray into both nostrils daily as needed for allergies.   latanoprost (XALATAN) 0.005 % ophthalmic solution Place 1 drop into the right eye at bedtime.   Multiple Vitamins-Minerals (MULTIVITAMINS THER. W/MINERALS) TABS Take 1 tablet by mouth daily.   pantoprazole (PROTONIX) 40 MG tablet Take 40 mg by mouth daily.   Propylene Glycol (SYSTANE BALANCE) 0.6 % SOLN Place 1 drop into both eyes 3 (three) times daily as needed (dry eyes).   rosuvastatin (CRESTOR) 40 MG tablet Take 40 mg by mouth daily.   traMADol (ULTRAM) 50 MG tablet Take 50 mg by mouth daily.   vitamin E 180 MG (400 UNITS) capsule Take 400 Units by mouth daily.   nitroGLYCERIN (NITROSTAT) 0.4 MG SL tablet Place 1 tablet (0.4 mg total) under the tongue every 5 (five) minutes as needed for chest pain. Do not exceed more than 3 doses per day.   [DISCONTINUED] HYDROcodone-acetaminophen (NORCO/VICODIN) 5-325 MG tablet Take 1 tablet by mouth every 6 (six) hours as needed for moderate pain.   [DISCONTINUED] tiZANidine (ZANAFLEX) 4 MG tablet Take 1 tablet (4 mg total) by mouth every 8 (eight) hours as needed for muscle spasms. (Patient not taking: Reported on 09/02/2021)   No facility-administered encounter medications on file as of 09/02/2021.    Allergies (verified) Sumatriptan, Penicillins, and Oxycodone   History: Past Medical History:  Diagnosis Date   Arthritis  CAD (coronary artery disease) 10/10/2011   NSTEMI with DES to the proximal RCA following flow wire evaluation. EF is normal.    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   CORONARY ANGIOPLASTY WITH STENT PLACEMENT  10/13/2011   DES to the proximal RCA. Dr Martinique   FRACTIONAL FLOW RESERVE WIRE Right 10/13/2011   Procedure: [place];  Surgeon: Peter M Martinique, MD;  Location: [facility] CATH LAB;  Service: Cardiovascular;  Laterality: Right;   HERNIA REPAIR     INGUINAL HERNIA REPAIR  10/23/2012   Procedure: HERNIA REPAIR INGUINAL ADULT;  Surgeon: Odis Hollingshead, MD;  Location: [place];  Service: General;  Laterality: Left;  Left lower quadrant; left inguinal hernia repair with mesh   INSERTION OF MESH  10/23/2012   Procedure: INSERTION OF MESH;  Surgeon: Odis Hollingshead, MD;  Location: [place];  Service: General;  Laterality: Left;  Left lower quadrant   LEFT HEART CATHETERIZATION WITH CORONARY ANGIOGRAM N/A 10/13/2011   Procedure: LEFT HEART CATHETERIZATION WITH CORONARY ANGIOGRAM;  Surgeon: Peter M Martinique, MD;  Location[facility] CATH LAB;  Service: Cardiovascular;  Laterality: N/A;   PERCUTANEOUS CORONARY STENT INTERVENTION (PCI-S) Right 10/13/2011   Procedure: PERCUTANEOUS CORONARY STENT INTERVENTION (PCI-S);  Surgeon: Peter M Martinique, MD;  Location: [facility] CATH LAB;  Service: Cardiovascular;  Laterality: Right;   SHOULDER ARTHROSCOPY W/ ROTATOR CUFF REPAIR Left 1990's   TOTAL HIP ARTHROPLASTY Left 05/14/2021   Procedure: LEFT TOTAL HIP ARTHROPLASTY ANTERIOR APPROACH;  Surgeon: Mcarthur Rossetti, MD;  Location: [facility];  Service: Orthopedics;  Laterality: Left;   TRIGGER FINGER RELEASE Left    3rd and 4th digits   Family History  Problem Relation Age of Onset   Cancer Mother        lung   Heart disease Father        heart attack    Cancer Father        colon   Social History   Socioeconomic History   Marital status: Married    Spouse name: Not on file   Number  of children: Not on file   Years of education: Not on file   Highest education level: 12th grade  Occupational History    Comment: Retired -[employer]  Tobacco Use   Smoking status: Former    Packs/day: 2.00    Years: 20.00    Pack years: 40.00    Types: Cigarettes    Quit date: 11/14/1982    Years since quitting: 38.8   Smokeless tobacco: Never  Vaping Use   Vaping Use: Never used  Substance and Sexual Activity   Alcohol use: Yes    Alcohol/week: 6.0 standard drinks    Types: 6 Cans of beer per week    Comment: ocass.   Drug use: No  Sexual activity: Not Currently  Other Topics Concern   Not on file  Social History Narrative   Pt lives with his wife. 1 and a half story home  No issues with stairs. 12th grade education   Social Determinants of Health   Financial Resource Strain: Low Risk    Difficulty of Paying Living Expenses: Not hard at all  Food Insecurity: No Food Insecurity   Worried About Charity fundraiser in the Last Year: Never true   Ran Out of Food in the Last Year: Never true  Transportation Needs: No Transportation Needs   Lack of Transportation (Medical): No   Lack of Transportation (Non-Medical): No  Physical Activity: Inactive   Days of Exercise per Week: 0 days   Minutes of Exercise per Session: 0 min  Stress: No Stress Concern Present   Feeling of Stress : Not at all  Social Connections: Moderately Integrated   Frequency of Communication with Friends and Family: More than three times a week   Frequency of Social Gatherings with Friends and Family: More than three times a week   Attends Religious Services: Never   Marine scientist or Organizations: Yes   Attends Music therapist: 1 to 4 times per year   Marital Status: Married    Tobacco Counseling Counseling given: Not Answered   Clinical Intake:  Pre-visit preparation completed: Yes  Pain : No/denies pain Pain Score: 0-No pain     BMI - recorded: 28.47 Nutritional  Status: BMI 25 -29 Overweight Nutritional Risks: None Diabetes: Yes CBG done?: No Did pt. bring in CBG monitor from home?: No  How often do you need to have someone help you when you read instructions, pamphlets, or other written materials from your doctor or pharmacy?: 1 - Never  Diabetic?Nutrition Risk Assessment:  Has the patient had any N/V/D within the last 2 months?  No  Does the patient have any non-healing wounds?  No  Has the patient had any unintentional weight loss or weight gain?  No   Diabetes:  Is the patient diabetic?  Yes  If diabetic, was a CBG obtained today?  No  Did the patient bring in their glucometer from home?  No  How often do you monitor your CBG's? As needed .   Financial Strains and Diabetes Management:  Are you having any financial strains with the device, your supplies or your medication? No .  Does the patient want to be seen by Chronic Care Management for management of their diabetes?  No  Would the patient like to be referred to a Nutritionist or for Diabetic Management?  No   Diabetic Exams:  Diabetic Eye Exam: Overdue for diabetic eye exam. Pt has been advised about the importance in completing this exam. Patient advised to call and schedule an eye exam. Diabetic Foot Exam: Overdue, Pt has been advised about the importance in completing this exam. Pt is scheduled for diabetic foot exam on next appt .   Interpreter Needed?: No  Information entered by :: Charlott Rakes, LPN   Activities of Daily Living In your present state of health, do you have any difficulty performing the following activities: 09/02/2021 05/14/2021  Hearing? N N  Vision? N N  Difficulty concentrating or making decisions? N N  Walking or climbing stairs? N Y  Dressing or bathing? N N  Doing errands, shopping? N N  Preparing Food and eating ? N -  Using the Toilet? N -  In the  past six months, have you accidently leaked urine? N -  Do you have problems with loss of  bowel control? N -  Managing your Medications? N -  Managing your Finances? N -  Housekeeping or managing your Housekeeping? N -  Some recent data might be hidden    Patient Care Team: Vivi Barrack, MD as PCP - General (Family Medicine) [place] as Referring Physician ([place]) Jovita Gamma, MD as Consulting Physician (Neurosurgery) Martinique, Peter M, MD as Consulting Physician (Cardiology) Marica Otter, [place] (Optometry) Edythe Clarity, [facility] (Pharmacist)  Indicate any recent Medical Services you may have received from other than [facility] providers in the past year (date may be approximate).     Assessment:   This is a routine wellness examination for [place].  Hearing/Vision screen Hearing Screening - Comments:: Pt denies any hearing issues  [facility] Screening - Comments:: [facility]   Dietary issues and exercise activities discussed: Current Exercise Habits: The patient does not participate in regular exercise at present (will return after hip surgery heals a little more)   Goals Addressed             This Visit's Progress    Patient Stated       Stay alive        Depression Screen [facility] 2/9 Scores 09/02/2021 06/09/2021 12/10/2020 11/27/2019 05/29/2019 11/28/2018 05/16/2018  PHQ - 2 Score 0 0 0 3 0 1 1  PHQ- 9 Score - - - 6 - 2 1    Fall Risk Fall Risk  09/02/2021 06/09/2021 12/10/2020 11/27/2019 05/29/2019  Falls in the past year? 0 0 1 0 0  Number falls in past yr: 0 0 1 - 0  Comment - - - - -  Injury with Fall? 0 - 0 - 0  Risk Factor Category  - - - - -  Risk for fall due to : Impaired vision No Fall Risks - - -  Follow up Falls prevention discussed - (No Data) - Falls evaluation completed;Education provided;Falls prevention discussed  Comment - - due to vertigo - -    FALL RISK PREVENTION PERTAINING TO THE HOME:  Any stairs in or around the home? Yes  If so, are there any without handrails? No  Home free of loose  throw rugs in walkways, pet beds, electrical cords, etc? Yes  Adequate lighting in your home to reduce risk of falls? Yes   ASSISTIVE DEVICES UTILIZED TO PREVENT FALLS:  Life alert? No  Use of a cane, walker or w/c? No  Grab bars in the bathroom? No  Shower chair or bench in shower? No  Elevated toilet seat or a handicapped toilet? No   TIMED UP AND GO:  Was the test performed? Yes .  Length of time to ambulate 10 feet: 10 sec.   Gait steady and fast without use of assistive device  Cognitive Function: MMSE - Mini Mental State Exam 05/29/2019 05/03/2017  Orientation to time 5 5  Orientation to Place 5 5  Registration 3 3  Attention/ Calculation 5 5  Recall 3 3  Language- name 2 objects - 2  Language- repeat 1 1  Language- follow 3 step command 3 3  Language- read & follow direction 1 1  Write a sentence - 1  Copy design - 1  Total score - 30     6CIT Screen 09/02/2021  What Year? 0 points  What month? 0 points  What time? 0 points  Count back from 20 0 points  Months in reverse 0 points  Repeat phrase 4 points  Total Score 4    Immunizations Immunization History  Administered Date(s) Administered   Fluad Quad(high Dose 65+) 10/06/2020, 09/02/2021   Influenza Whole 11/14/2005   Influenza, High Dose Seasonal PF 08/10/2012, 09/12/2013, 08/28/2015, 10/19/2016, 09/18/2017, 10/21/2017, 09/12/2018, 09/24/2019   Influenza-Unspecified 09/14/2010, 09/15/2011, 07/15/2013, 10/01/2014, 10/14/2016   Moderna Sars-Covid-2 Vaccination 11/29/2019, 12/27/2019, 09/01/2020   Pneumococcal Conjugate-13 05/06/2014   Pneumococcal Polysaccharide-23 03/03/2009   Pneumococcal-Unspecified 05/14/2010   Td 10/14/2006   Tdap 11/14/2009, 11/30/2017   Zoster Recombinat (Shingrix) 08/30/2017, 02/12/2018, 04/14/2018   Zoster, Live 10/28/2009    TDAP status: Up to date  Flu Vaccine status: Completed at today's visit  Pneumococcal vaccine status: Up to date  Covid-19 vaccine status:  Completed vaccines  Qualifies for Shingles Vaccine? Yes   Zostavax completed Yes   Shingrix Completed?: Yes  Screening Tests Health Maintenance  Topic Date Due   OPHTHALMOLOGY EXAM  11/14/2019   FOOT EXAM  05/28/2020   COVID-19 Vaccine (4 - Booster for Moderna series) 10/27/2020   HEMOGLOBIN A1C  12/10/2021   URINE MICROALBUMIN  06/09/2022   TETANUS/TDAP  12/01/2027   Pneumonia Vaccine 37+ Years old  Completed   Hepatitis C Screening  Completed   Zoster Vaccines- Shingrix  Completed   HPV VACCINES  Aged Out   INFLUENZA VACCINE  Discontinued    Health Maintenance  Health Maintenance Due  Topic Date Due   OPHTHALMOLOGY EXAM  11/14/2019   FOOT EXAM  05/28/2020   COVID-19 Vaccine (4 - Booster for Moderna series) 10/27/2020    Colorectal cancer screening: Type of screening: Colonoscopy. Completed 01/03/17 . Repeat every 5 years    Additional Screening:  Hepatitis C Screening:  Completed 05/16/18  Vision Screening: Recommended annual ophthalmology exams for early detection of glaucoma and other disorders of the eye. Is the patient up to date with their annual eye exam?  Yes  Who is the provider or what is the [facility] of the [facility] in which the [facility]  If pt is not established with a provider, would they like to be referred to a provider to establish care? No .   Dental Screening: Recommended annual dental exams for proper oral hygiene  Community Resource Referral / Chronic Care Management: CRR required this visit?  No   CCM required this visit?  No      Plan:     I have personally reviewed and noted the following in the patient's chart:   Medical and social history Use of alcohol, tobacco or illicit drugs  Current medications and supplements including opioid prescriptions. Patient is currently taking opioid prescriptions. Information provided to patient regarding non-opioid alternatives. Patient advised to discuss non-opioid treatment plan  with their provider. Functional ability and status Nutritional status Physical activity Advanced directives List of other physicians Hospitalizations, surgeries, and ER visits in previous 12 months Vitals Screenings to include cognitive, depression, and falls Referrals and appointments  In addition, I have reviewed and discussed with patient certain preventive protocols, quality metrics, and best practice recommendations. A written personalized care plan for preventive services as well as general preventive health recommendations were provided to patient.     Willette Brace, LPN   [id number]   Nurse Notes: None

## 2021-09-06 ENCOUNTER — Telehealth: Payer: Self-pay | Admitting: Pharmacist

## 2021-09-06 NOTE — Chronic Care Management (AMB) (Signed)
Chronic Care Management Pharmacy Assistant   Name: Robert [name]  MRN: [id number] DOB: [birth year]   Reason for Encounter: Diabetes Adherence Call    Recent office visits:  None  Recent consult visits:  08/25/2021 OV (cardiology) Martinique, Peter M, MD; no medication changes indicated.  08/04/2021 OV (orthopedics) Mcarthur Rossetti, MD; no medication changes indicated.  Hospital visits:  None in previous 6 months  Medications: Outpatient Encounter Medications as of 09/06/2021  Medication Sig   Ascorbic Acid (VITAMIN C) 1000 MG tablet Take 1,000 mg by mouth daily.   aspirin 81 MG chewable tablet Chew 1 tablet (81 mg total) by mouth 2 (two) times daily.   azelastine (ASTELIN) 0.1 % nasal spray Place 1 spray into both nostrils 2 (two) times daily. Use in each nostril as directed   B Complex-C (SUPER B COMPLEX PO) Take 1 tablet by mouth daily.   brimonidine (ALPHAGAN) 0.2 % ophthalmic solution Place 1 drop into the right eye in the morning and at bedtime.   fluticasone (FLONASE) 50 MCG/ACT nasal spray Place 1 spray into both nostrils daily as needed for allergies.   latanoprost (XALATAN) 0.005 % ophthalmic solution Place 1 drop into the right eye at bedtime.   Multiple Vitamins-Minerals (MULTIVITAMINS THER. W/MINERALS) TABS Take 1 tablet by mouth daily.   nitroGLYCERIN (NITROSTAT) 0.4 MG SL tablet Place 1 tablet (0.4 mg total) under the tongue every 5 (five) minutes as needed for chest pain. Do not exceed more than 3 doses per day.   pantoprazole (PROTONIX) 40 MG tablet Take 40 mg by mouth daily.   Propylene Glycol (SYSTANE BALANCE) 0.6 % SOLN Place 1 drop into both eyes 3 (three) times daily as needed (dry eyes).   rosuvastatin (CRESTOR) 40 MG tablet Take 40 mg by mouth daily.   traMADol (ULTRAM) 50 MG tablet Take 50 mg by mouth daily.   vitamin E 180 MG (400 UNITS) capsule Take 400 Units by mouth daily.   No facility-administered encounter medications on file as of  09/06/2021.   Recent Relevant Labs: Lab Results  Component Value Date/Time   HGBA1C 7.3 (H) 06/09/2021 09:48 AM   HGBA1C 6.8 (A) 12/10/2020 09:41 AM   HGBA1C 6.9 (H) 06/09/2020 10:54 AM   MICROALBUR <0.7 06/09/2021 09:48 AM   MICROALBUR <0.7 05/23/2018 09:12 AM    Kidney Function Lab Results  Component Value Date/Time   CREATININE 0.83 06/09/2021 09:48 AM   CREATININE 0.81 05/15/2021 03:15 AM   CREATININE 1.02 01/26/2018 03:55 PM   CREATININE 1.08 01/18/2017 09:32 AM   GFR 84.42 06/09/2021 09:48 AM   GFRNONAA >60 05/15/2021 03:15 AM   GFRAA 92 06/09/2020 10:54 AM    Current antihyperglycemic regimen:  None  What recent interventions/DTPs have been made to improve glycemic control:  No recent interventions or DTPs.  Have there been any recent hospitalizations or ED visits since last visit with CPP? No  Patient denies hypoglycemic symptoms.  Patient denies hyperglycemic symptoms.  How often are you checking your blood sugar? Patient states he has not checked his blood sugars in over a week. He states he doesn't have any readings to report today.  During the week, how often does your blood glucose drop below 70? Unsure, patient states he is doubtful as he is asymptomatic.  Are you checking your feet daily/regularly? Yes, patient states he checks his feet regularly.  Adherence Review: Is the patient currently on a STATIN medication? Yes Is the patient currently on ACE/ARB medication? No Does  the patient have >5 day gap between last estimated fill dates? No   Care Gaps: Medicare Annual Wellness: Completed Ophthalmology Exam: Overdue Foot Exam: Overdue since 05/28/2020 Hemoglobin A1C: 7.3% on 06/09/2021 Colonoscopy: Aged out, last completed 05/14/2009   Future Appointments  Date Time Provider [place]  09/20/2021 10:30 AM [name], MD [facility] None  11/17/2021  8:15 AM [facility]  02/01/2022  9:15 AM Mcarthur Rossetti, MD [facility] None   06/15/2022  8:20 AM Vivi Barrack, MD [facility]  09/15/2022  9:30 AM [facility] HEALTH COACH [facility]     Star Rating Drugs: Rosuvastatin 40 mg last filled 06/09/2020 90 DS - patient receives his medication through [place].  April D Calhoun, [place] Pharmacist Assistant [phone]

## 2021-09-20 ENCOUNTER — Other Ambulatory Visit: Payer: Self-pay | Admitting: Neurology

## 2021-09-20 ENCOUNTER — Encounter: Payer: Self-pay | Admitting: Neurology

## 2021-09-20 ENCOUNTER — Ambulatory Visit: Payer: Medicare HMO | Admitting: Neurology

## 2021-09-20 ENCOUNTER — Telehealth: Payer: Self-pay | Admitting: *Deleted

## 2021-09-20 VITALS — BP 117/57 | HR 70 | Ht 73.0 in | Wt 214.4 lb

## 2021-09-20 DIAGNOSIS — G43711 Chronic migraine without aura, intractable, with status migrainosus: Secondary | ICD-10-CM | POA: Diagnosis not present

## 2021-09-20 MED ORDER — AJOVY 225 MG/1.5ML ~~LOC~~ SOAJ: 225.0000 mg | SUBCUTANEOUS | 11 refills | Status: DC

## 2021-09-20 MED ORDER — NURTEC 75 MG PO TBDP: 75.0000 mg | ORAL_TABLET | Freq: Every day | ORAL | 6 refills | Status: DC | PRN

## 2021-09-20 NOTE — Progress Notes (Signed)
[id number]    Provider:  Dr Jaynee Eagles Requesting Provider: Vivi Barrack, MD Primary Care Provider:  Vivi Barrack, MD  CC:  migraines  HPI:  Robert [name] is a 75 y.o. male here as requested by Vivi Barrack, MD for migraines. He is on botox already and gets it completed every 9 weeks (I do not see any records on epic or "Care Everywhere" may be at the [place]).  He has been to pain management for cervical radiculopathy and chronic neck pain in the past and currently sees Dr. Ernestina Patches for low back pain and injections for pain management.  He has also seen other neurologists such as Dr. Tomi Likens at [facility] neurology in 2016 and [place].  He has a past medical history of coronary artery disease, chronic intractable migraines, hyperlipidemia, hypertension, MI, OSA on CPAP, prediabetes, chronic dry eyes.  I reviewed Dr. 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  He has had migraines since the age of [age] lasted about 3 days went on years every 3 months. They have come closer and closer and now the he gets botox every 9 weeks. It helps reduce the pain by at least 50% but he has daily migraines.  He uses his cpap every night. He has migraines every day can last all day, no known trigers, not upon waking, tramadol helps a little, unknwon triggers. The whole top of the head hurts, his eyes hurt, pounding/pulsating/throbbing, light sensitivity, sound nausea, no vomiting, some days are worse than others.They can be severe. He takes daily tramadol (discussed this can make it worse). [facility], goes to [facility]y at the [facility], saw Dr. Loretta Plume neurologist.  Triptans contraindicated due to stroke. We had  along talk about options today.Wife here and provides much information. No other focal neurologic deficits, associated symptoms, inciting events or modifiable factors.  Reviewed notes, labs and imaging from outside physicians, which showed:  Mri brain/orbits 2020: IMPRESSION:reviewed images and agree (reviewed with patient and his wife as well.) 1. No acute intracranial abnormality. 2. Mild-to-moderate chronic small vessel ischemic disease and small chronic left occipital infarct. 3. Negative orbital imaging.    From a thorough review of records, patient has tried the following medications that can be used in migraine management including Tylenol, aspirin, Flexeril, Plavix, Flexeril, Decadron injections, Valium, Aimovig, gabapentin, hydromorphone, ibuprofen, ketorolac injections, Robaxin, metoprolol, Zofran, oxycodone, tramadol, tizanidine, verapamil, topamax, amitriptyline.   Jhba1c 7.3 06/09/2021, tsh normal, cbc/cmp unremarkable  Review of Systems: Patient complains of symptoms per HPI as well as the following symptoms head pain. Pertinent negatives and positives per HPI. All others negative.   Social History   Socioeconomic History   Marital status: Married    Spouse name: Not on file   Number of children: Not on file   Years of education: Not on file   Highest education level: 12th grade  Occupational History    Comment: Retired -[employer]  Tobacco Use   Smoking status: Former    Packs/day: 2.00    Years: 20.00    Pack years: 40.00    Types: Cigarettes    Quit date: 11/14/1982    Years since  quitting: 38.8   Smokeless tobacco: Never  Vaping Use   Vaping Use: Never used  Substance and Sexual Activity   Alcohol use: Not Currently    Comment: occ   Drug use: No   Sexual activity: Not on file  Other Topics Concern   Not on file  Social History Narrative   Pt lives with his wife. 1 and a half story home  No issues with stairs.  12th grade education   Social Determinants of Health   Financial Resource Strain: Low Risk    Difficulty of Paying Living Expenses: Not hard at all  Food Insecurity: No Food Insecurity   Worried About Charity fundraiser in the Last Year: Never true   Ran Out of Food in the Last Year: Never true  Transportation Needs: No Transportation Needs   Lack of Transportation (Medical): No   Lack of Transportation (Non-Medical): No  Physical Activity: Inactive   Days of Exercise per Week: 0 days   Minutes of Exercise per Session: 0 min  Stress: No Stress Concern Present   Feeling of Stress : Not at all  Social Connections: Moderately Integrated   Frequency of Communication with Friends and Family: More than three times a week   Frequency of Social Gatherings with Friends and Family: More than three times a week   Attends Religious Services: Never   Marine scientist or Organizations: Yes   Attends Music therapist: 1 to 4 times per year   Marital Status: Married  Human resources officer Violence: Not At Risk   Fear of Current or Ex-Partner: No   Emotionally Abused: No   Physically Abused: No   Sexually Abused: No    Family History  Problem Relation Age of Onset   Cancer Mother        lung   Heart disease Father        heart attack    Cancer Father        colon   Migraines Neg Hx    Headache Neg Hx     Past Medical History:  Diagnosis Date   Arthritis    CAD (coronary artery disease) 10/10/2011   NSTEMI with DES to the proximal RCA following flow wire evaluation. EF is normal.    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    "left"   Squamous cell carcinoma  12/17/2016    Patient Active Problem List   Diagnosis Date Noted   Status post total replacement of left hip 05/14/2021   Type 2 diabetes mellitus with hyperglycemia ([place]) 05/23/2018   Chronically dry eyes, bilateral 05/23/2018   Migraines 11/28/2016   Hypertension associated with diabetes ([place]) 10/11/2011   History of basal cell carcinoma ([place]), right ear 03/03/2009   Hyperlipidemia associated with type 2 diabetes mellitus ([place]) 01/11/2008   Allergic rhinitis 01/07/2008   Prostatitis, recurrent 01/07/2008   History of gastric ulcer 01/07/2008   History of colon polyps, followed by Dr. Earlean Shawl 01/07/2008   GERD 04/02/2007   Sleep apnea 04/02/2007    Past Surgical History:  Procedure Laterality Date  BACK SURGERY     CATARACT EXTRACTION Bilateral 2019   CATARACT EXTRACTION W/ INTRAOCULAR LENS IMPLANT Bilateral 01/2019   CERVICAL DISC SURGERY  1980's?   CORONARY ANGIOPLASTY WITH STENT PLACEMENT  10/13/2011   DES to the proximal RCA. Dr Martinique   FRACTIONAL FLOW RESERVE WIRE Right 10/13/2011   Procedure: [place];  Surgeon: Peter M Martinique, MD;  Location: [facility] CATH LAB;  Service: Cardiovascular;  Laterality: Right;   HERNIA REPAIR     INGUINAL HERNIA REPAIR  10/23/2012   Procedure: HERNIA REPAIR INGUINAL ADULT;  Surgeon: Odis Hollingshead, MD;  Location: [place];  Service: General;  Laterality: Left;  Left lower quadrant; left inguinal hernia repair with mesh   INSERTION OF MESH  10/23/2012   Procedure: INSERTION OF MESH;  Surgeon: Odis Hollingshead, MD;  Location: [place];  Service: General;  Laterality: Left;  Left lower quadrant   LEFT HEART CATHETERIZATION WITH CORONARY ANGIOGRAM N/A 10/13/2011   Procedure: LEFT HEART CATHETERIZATION WITH CORONARY ANGIOGRAM;  Surgeon: Peter M Martinique, MD;  Location: [facility] CATH LAB;  Service: Cardiovascular;  Laterality: N/A;   PERCUTANEOUS CORONARY STENT INTERVENTION (PCI-S) Right 10/13/2011   Procedure: PERCUTANEOUS CORONARY STENT  INTERVENTION (PCI-S);  Surgeon: Peter M Martinique, MD;  Location: [facility] CATH LAB;  Service: Cardiovascular;  Laterality: Right;   SHOULDER ARTHROSCOPY W/ ROTATOR CUFF REPAIR Left 1990's   TOTAL HIP ARTHROPLASTY Left 05/14/2021   Procedure: LEFT TOTAL HIP ARTHROPLASTY ANTERIOR APPROACH;  Surgeon: Mcarthur Rossetti, MD;  Location: [facility];  Service: Orthopedics;  Laterality: Left;   TRIGGER FINGER RELEASE Left    3rd and 4th digits    Current Outpatient Medications  Medication Sig Dispense Refill   Ascorbic Acid (VITAMIN C) 1000 MG tablet Take 1,000 mg by mouth daily.     aspirin 81 MG chewable tablet Chew 1 tablet (81 mg total) by mouth 2 (two) times daily. 35 tablet 0   azelastine (ASTELIN) 0.1 % nasal spray Place 1 spray into both nostrils 2 (two) times daily. Use in each nostril as directed     B Complex-C (SUPER B COMPLEX PO) Take 1 tablet by mouth daily.     brimonidine (ALPHAGAN) 0.2 % ophthalmic solution Place 1 drop into the right eye in the morning and at bedtime.     fluticasone (FLONASE) 50 MCG/ACT nasal spray Place 1 spray into both nostrils daily as needed for allergies.     Fremanezumab-vfrm (AJOVY) 225 MG/1.5ML SOAJ Inject 225 mg into the skin every 30 (thirty) days. 1.5 mL 11   latanoprost (XALATAN) 0.005 % ophthalmic solution Place 1 drop into the right eye at bedtime.     Multiple Vitamins-Minerals (MULTIVITAMINS THER. W/MINERALS) TABS Take 1 tablet by mouth daily.     Multiple Vitamins-Minerals (ZINC PO) Take 50 mg by mouth daily.     pantoprazole (PROTONIX) 40 MG tablet Take 40 mg by mouth daily.     Propylene Glycol (SYSTANE BALANCE) 0.6 % SOLN Place 1 drop into both eyes 3 (three) times daily as needed (dry eyes).     Rimegepant Sulfate (NURTEC) 75 MG TBDP Take 75 mg by mouth daily as needed. For migraines. Take as close to onset of migraine as possible. One daily maximum. 10 tablet 6   rosuvastatin (CRESTOR) 40 MG tablet Take 40 mg by mouth daily.     traMADol (ULTRAM) 50  MG tablet Take 50 mg by mouth daily.     vitamin E 180 MG (400 UNITS) capsule  Take 400 Units by mouth daily.     nitroGLYCERIN (NITROSTAT) 0.4 MG SL tablet Place 1 tablet (0.4 mg total) under the tongue every 5 (five) minutes as needed for chest pain. Do not exceed more than 3 doses per day. 25 tablet 11   No current facility-administered medications for this visit.    Allergies as of 09/20/2021 - Review Complete 09/20/2021  Allergen Reaction Noted   Sumatriptan Shortness Of Breath 01/07/2008   Penicillins Itching    Oxycodone Itching 11/21/2012    Vitals: BP (!) 117/57   Pulse 70   Ht 6\' 1"  (1.854 m)   Wt 214 lb 6.4 oz (97.3 kg)   BMI 28.29 kg/m  Last Weight:  Wt Readings from Last 1 Encounters:  09/20/21 214 lb 6.4 oz (97.3 kg)   Last Height:   Ht Readings from Last 1 Encounters:  09/20/21 6\' 1"  (1.854 m)     Physical exam: Exam: Gen: NAD, conversant, well nourised, overweight, well groomed                     CV: RRR, no MRG. No Carotid Bruits. No peripheral edema, warm, nontender Eyes: Conjunctivae clear without exudates or hemorrhage  Neuro: Detailed Neurologic Exam  Speech:    Speech is normal; fluent and spontaneous with normal comprehension.  Cognition:    The patient is oriented to person, place, and time;     recent and remote memory intact;     language fluent;     normal attention, concentration,     fund of knowledge Cranial Nerves:    The pupils are equal, round, and reactive to light. Pupils too small to visualize fundi. Visual fields are full to finger confrontation. Extraocular movements are intact. Trigeminal sensation is intact and the muscles of mastication are normal. The face is symmetric. The palate elevates in the midline. Hearing intact. Voice is normal. Shoulder shrug is normal. The tongue has normal motion without fasciculations.   Coordination:    Normal   Gait:    normal.   Motor Observation:    No asymmetry, no atrophy, and no  involuntary movements noted. Tone:    Normal muscle tone.    Posture:    Posture is normal. normal erect    Strength:    Strength is V/V in the upper and lower limbs.      Sensation: intact to LT     Reflex Exam:  DTR's:    Deep tendon reflexes in the upper and lower extremities are symmetrical bilaterally.   Toes:    The toes are equiv bilaterally.   Clonus:    Clonus is absent.    Assessment/Plan:  75 y.o. male here as requested by Vivi Barrack, MD for migraines. He is on botox already and gets it completed every 9 weeks (I do not see any records on epic or "Care Everywhere" may be at the [place]).  He has been to pain management for cervical radiculopathy and chronic neck pain in the past and currently sees Dr. Ernestina Patches for low back pain and injections for pain management.  He has also seen other neurologists such as Dr. Tomi Likens at [place] [facility]y in 2016 and [place].  He has a past medical history of coronary artery disease, chronic intractable migraines, hyperlipidemia, hypertension, MI, OSA on CPAP, prediabetes, chronic dry eyes.  I reviewed Dr. 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 We will try Ajovy. Gave him 3 samples he will let us know if it helps. Also gave him nurtec, if he feels it helps we may need to go through the [place] - Triptans contraindicated, encephalomalacia in the left occipital lobe, unclear if old stroke.   No orders of the defined types were placed in this encounter.  Meds ordered this encounter  Medications   Fremanezumab-vfrm (AJOVY) 225 MG/1.5ML SOAJ    Sig: Inject 225 mg into the skin every 30 (thirty) days.     Dispense:  1.5 mL    Refill:  11   Rimegepant Sulfate (NURTEC) 75 MG TBDP    Sig: Take 75 mg by mouth daily as needed. For migraines. Take as close to onset of migraine as possible. One daily maximum.    Dispense:  10 tablet    Refill:  6    Cc: Vivi Barrack, MD,  Vivi Barrack, MD  Sarina Ill, MD  [facility] [address] [place], [place] [address]  Phone [phone] Fax [phone]

## 2021-09-20 NOTE — Telephone Encounter (Signed)
Nurtec PA completed on Cover My Meds. Key: [id number]. Awaiting determination from [facility].

## 2021-09-20 NOTE — Patient Instructions (Signed)
Ajovy monthly Nurtec as needed daily  Rimegepant oral dissolving tablet What is this medication? RIMEGEPANT (ri ME je pant) is used to treat migraine headaches with or without aura. An aura is a strange feeling or visual disturbance that warns you of an attack. It is also used to prevent migraine headaches. This medicine may be used for other purposes; ask your health care provider or pharmacist if you have questions. COMMON BRAND NAME(S): NURTEC ODT What should I tell my care team before I take this medication? They need to know if you have any of these conditions: kidney disease liver disease an unusual or allergic reaction to rimegepant, other medicines, foods, dyes, or preservatives pregnant or trying to get pregnant breast-feeding How should I use this medication? Take the medicine by mouth. Follow the directions on the prescription label. Leave the tablet in the sealed blister pack until you are ready to take it. With dry hands, open the blister and gently remove the tablet. If the tablet breaks or crumbles, throw it away and take a new tablet out of the blister pack. Place the tablet in the mouth and allow it to dissolve, and then swallow. Do not cut, crush, or chew this medicine. You do not need water to take this medicine. Talk to your pediatrician about the use of this medicine in children. Special care may be needed. Overdosage: If you think you have taken too much of this medicine contact a poison control center or emergency room at once. NOTE: This medicine is only for you. Do not share this medicine with others. What if I miss a dose? This does not apply. This medicine is not for regular use. What may interact with this medication? This medicine may interact with the following medications: certain medicines for fungal infections like fluconazole, itraconazole rifampin This list may not describe all possible interactions. Give your health care provider a list of all the  medicines, herbs, non-prescription drugs, or dietary supplements you use. Also tell them if you smoke, drink alcohol, or use illegal drugs. Some items may interact with your medicine. What should I watch for while using this medication? Visit your health care professional for regular checks on your progress. Tell your health care professional if your symptoms do not start to get better or if they get worse. What side effects may I notice from receiving this medication? Side effects that you should report to your doctor or health care professional as soon as possible: allergic reactions like skin rash, itching or hives; swelling of the face, lips, or tongue Side effects that usually do not require medical attention (report these to your doctor or health care professional if they continue or are bothersome): nausea This list may not describe all possible side effects. Call your doctor for medical advice about side effects. You may report side effects to FDA at 1-800-FDA-1088. Where should I keep my medication? Keep out of the reach of children and pets. Store at room temperature between 20 and 25 degrees C (68 and 77 degrees F). Get rid of any unused medicine after the expiration date. To get rid of medicines that are no longer needed or have expired: Take the medicine to a medicine take-back program. Check with your pharmacy or law enforcement to find a location. If you cannot return the medicine, check the label or package insert to see if the medicine should be thrown out in the garbage or flushed down the toilet. If you are not sure, ask your health  care provider. If it is safe to put it in the trash, take the medicine out of the container. Mix the medicine with cat litter, dirt, coffee grounds, or other unwanted substance. Seal the mixture in a bag or container. Put it in the trash. NOTE: This sheet is a summary. It may not cover all possible information. If you have questions about this medicine,  talk to your doctor, pharmacist, or health care provider.  2022 Elsevier/Gold Standard (2020-04-14 00:00:00) Rolanda Lundborg injection What is this medication? FREMANEZUMAB (fre ma NEZ ue mab) is used to prevent migraine headaches. This medicine may be used for other purposes; ask your health care provider or pharmacist if you have questions. COMMON BRAND NAME(S): AJOVY What should I tell my care team before I take this medication? They need to know if you have any of these conditions: an unusual or allergic reaction to fremanezumab, other medicines, foods, dyes, or preservatives pregnant or trying to get pregnant breast-feeding How should I use this medication? This medicine is for injection under the skin. You will be taught how to prepare and give this medicine. Use exactly as directed. Take your medicine at regular intervals. Do not take your medicine more often than directed. It is important that you put your used needles and syringes in a special sharps container. Do not put them in a trash can. If you do not have a sharps container, call your pharmacist or healthcare provider to get one. Talk to your pediatrician regarding the use of this medicine in children. Special care may be needed. Overdosage: If you think you have taken too much of this medicine contact a poison control center or emergency room at once. NOTE: This medicine is only for you. Do not share this medicine with others. What if I miss a dose? If you miss a dose, take it as soon as you can. If it is almost time for your next dose, take only that dose. Do not take double or extra doses. What may interact with this medication? Interactions are not expected. This list may not describe all possible interactions. Give your health care provider a list of all the medicines, herbs, non-prescription drugs, or dietary supplements you use. Also tell them if you smoke, drink alcohol, or use illegal drugs. Some items may interact with  your medicine. What should I watch for while using this medication? Tell your doctor or healthcare professional if your symptoms do not start to get better or if they get worse. What side effects may I notice from receiving this medication? Side effects that you should report to your doctor or health care professional as soon as possible: allergic reactions like skin rash, itching or hives, swelling of the face, lips, or tongue Side effects that usually do not require medical attention (report these to your doctor or health care professional if they continue or are bothersome): pain, redness, or irritation at site where injected This list may not describe all possible side effects. Call your doctor for medical advice about side effects. You may report side effects to FDA at 1-800-FDA-1088. Where should I keep my medication? Keep out of the reach of children. You will be instructed on how to store this medicine. Throw away any unused medicine after the expiration date on the label. NOTE: This sheet is a summary. It may not cover all possible information. If you have questions about this medicine, talk to your doctor, pharmacist, or health care provider.  2022 Elsevier/Gold Standard (2017-08-01 00:00:00)

## 2021-09-22 ENCOUNTER — Other Ambulatory Visit: Payer: Self-pay | Admitting: Neurology

## 2021-09-22 DIAGNOSIS — G43711 Chronic migraine without aura, intractable, with status migrainosus: Secondary | ICD-10-CM

## 2021-09-22 NOTE — Telephone Encounter (Signed)
I called the patient and LVM (ok per DPR) advising insurance does not approve Ajovy but they do cover Emgality.  This prescription has been sent to patient's pharmacy but I advised him, per Dr. Jaynee Eagles, to use the samples of the Ajovy every 30 days until he is out of medication and then he can fill the [facility].

## 2021-09-23 ENCOUNTER — Telehealth: Payer: Self-pay | Admitting: *Deleted

## 2021-09-23 NOTE — Telephone Encounter (Signed)
Completed Emgality PA on Cover My Meds. Key: [id number]. Awaiting determination from [facility].

## 2021-09-23 NOTE — Telephone Encounter (Signed)
PA Case: [id number], Status: Approved, Coverage Starts on: 09/23/2021 12:00:00 AM, Coverage Ends on: 12/22/2021 12:00:00 AM. Questions? Contact [phone].

## 2021-09-27 NOTE — Telephone Encounter (Signed)
Received the approval letter from [facility]. This was faxed to pharmacy. Received a receipt of confirmation.

## 2021-10-05 ENCOUNTER — Telehealth: Payer: Self-pay | Admitting: Physical Medicine and Rehabilitation

## 2021-10-05 NOTE — Telephone Encounter (Signed)
Pt wife called and was wanting to get her husband scheduled for another injection. Last injection was Left L4 tf esi on 07/12/2021. Wife states pt got relief from injection and is just now needing another.  CB [phone]

## 2021-10-12 ENCOUNTER — Telehealth: Payer: Self-pay | Admitting: *Deleted

## 2021-10-12 NOTE — Telephone Encounter (Signed)
Ortho bundle 90 day call to patient S/P Left total hip arthroplasty. Doing well from hip. "Mild pains". Recently had injection in back and is doing very well from this.

## 2021-10-20 ENCOUNTER — Encounter: Payer: Self-pay | Admitting: Family

## 2021-10-20 ENCOUNTER — Ambulatory Visit (INDEPENDENT_AMBULATORY_CARE_PROVIDER_SITE_OTHER): Payer: Medicare HMO | Admitting: Family

## 2021-10-20 VITALS — BP 121/61 | HR 82 | Temp 97.4°F | Ht 73.0 in | Wt 213.6 lb

## 2021-10-20 DIAGNOSIS — R053 Chronic cough: Secondary | ICD-10-CM

## 2021-10-20 MED ORDER — METHYLPREDNISOLONE ACETATE 80 MG/ML IJ SUSP
80.0000 mg | Freq: Once | INTRAMUSCULAR | Status: AC
  Administered 2021-10-20: 80 mg via INTRAMUSCULAR

## 2021-10-20 NOTE — Assessment & Plan Note (Signed)
Steroid shot given today, continuous cough for 10 days, prefers injection over prednisone, advised to call back after a few days if still not better and can send oral dose. Continue hydrating with at least 64oz water daily, continue tylenol or advil tid prn.

## 2021-10-20 NOTE — Patient Instructions (Signed)

## 2021-10-20 NOTE — Progress Notes (Signed)
Subjective:     Patient ID: Robert [name], male    DOB: 15-Jan-1946, 75 y.o.   MRN: [id number]  Chief Complaint  Patient presents with   Nasal Congestion    Started 1-2 weeks ago. He has taken Mucinex, but has not helped. He denies fever.    Cough   Shortness of Breath    On exertion.     HPI: Upper Respiratory Infection: Symptoms include congestion, no  fever, non productive cough, and shortness of breath.  Onset of symptoms was 10 days ago, unchanged since that time. He is drinking moderate amounts of fluids. Evaluation to date: none.  Treatment to date: decongestants.    Health Maintenance Due  Topic Date Due   OPHTHALMOLOGY EXAM  11/14/2019   FOOT EXAM  05/28/2020   COVID-19 Vaccine (4 - Booster for Moderna series) 10/27/2020    Past Medical History:  Diagnosis Date   Arthritis    CAD (coronary artery disease) 10/10/2011   NSTEMI with DES to the proximal RCA following flow wire evaluation. EF is normal.    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   CORONARY ANGIOPLASTY WITH STENT PLACEMENT  10/13/2011   DES to the proximal RCA. Dr Martinique   FRACTIONAL FLOW RESERVE WIRE Right 10/13/2011   Procedure: [place];  Surgeon: Peter M Martinique, MD;  Location: [facility] CATH LAB;  Service: Cardiovascular;  Laterality: Right;   HERNIA REPAIR     INGUINAL HERNIA REPAIR  10/23/2012   Procedure: HERNIA REPAIR  INGUINAL ADULT;  Surgeon: Odis Hollingshead, MD;  Location: [place];  Service: General;  Laterality: Left;  Left lower quadrant; left inguinal hernia repair with mesh   INSERTION OF MESH  10/23/2012   Procedure: INSERTION OF MESH;  Surgeon: Odis Hollingshead, MD;  Location: [place];  Service: General;  Laterality: Left;  Left lower quadrant   LEFT HEART CATHETERIZATION WITH CORONARY ANGIOGRAM N/A 10/13/2011   Procedure: LEFT HEART CATHETERIZATION WITH CORONARY ANGIOGRAM;  Surgeon: Peter M Martinique, MD;  Location: [facility] CATH LAB;  Service: Cardiovascular;  Laterality: N/A;   PERCUTANEOUS CORONARY STENT INTERVENTION (PCI-S) Right 10/13/2011   Procedure: PERCUTANEOUS CORONARY STENT INTERVENTION (PCI-S);  Surgeon: Peter M Martinique, MD;  Location: [facility] CATH LAB;  Service: Cardiovascular;  Laterality: Right;   SHOULDER ARTHROSCOPY W/ ROTATOR CUFF REPAIR Left 1990's   TOTAL HIP ARTHROPLASTY Left 05/14/2021   Procedure: LEFT TOTAL HIP ARTHROPLASTY ANTERIOR APPROACH;  Surgeon: Mcarthur Rossetti, MD;  Location: [facility];  Service: Orthopedics;  Laterality: Left;   TRIGGER FINGER RELEASE Left    3rd and 4th digits    Outpatient Medications Prior to Visit  Medication Sig Dispense Refill   Ascorbic Acid (VITAMIN C) 1000 MG tablet Take 1,000 mg by mouth daily.     aspirin 81  MG chewable tablet Chew 1 tablet (81 mg total) by mouth 2 (two) times daily. 35 tablet 0   azelastine (ASTELIN) 0.1 % nasal spray Place 1 spray into both nostrils 2 (two) times daily. Use in each nostril as directed     B Complex-C (SUPER B COMPLEX PO) Take 1 tablet by mouth daily.     brimonidine (ALPHAGAN) 0.2 % ophthalmic solution Place 1 drop into the right eye in the morning and at bedtime.     EMGALITY 120 MG/ML SOAJ INJECT 120 MG INTO THE SKIN EVERY 30 (THIRTY) DAYS. 1 mL 11   fluticasone (FLONASE) 50 MCG/ACT nasal spray Place 1 spray into both nostrils daily as needed for allergies.     latanoprost (XALATAN) 0.005 % ophthalmic solution  Place 1 drop into the right eye at bedtime.     Multiple Vitamins-Minerals (MULTIVITAMINS THER. W/MINERALS) TABS Take 1 tablet by mouth daily.     Multiple Vitamins-Minerals (ZINC PO) Take 50 mg by mouth daily.     NURTEC 75 MG TBDP TAKE 75 MG BY MOUTH DAILY AS NEEDED. FOR MIGRAINES. TAKE AS CLOSE TO ONSET OF MIGRAINE AS POSSIBLE. ONE DAILY MAXIMUM. 10 tablet 6   pantoprazole (PROTONIX) 40 MG tablet Take 40 mg by mouth daily.     Propylene Glycol (SYSTANE BALANCE) 0.6 % SOLN Place 1 drop into both eyes 3 (three) times daily as needed (dry eyes).     rosuvastatin (CRESTOR) 40 MG tablet Take 40 mg by mouth daily.     traMADol (ULTRAM) 50 MG tablet Take 50 mg by mouth daily.     vitamin E 180 MG (400 UNITS) capsule Take 400 Units by mouth daily.     nitroGLYCERIN (NITROSTAT) 0.4 MG SL tablet Place 1 tablet (0.4 mg total) under the tongue every 5 (five) minutes as needed for chest pain. Do not exceed more than 3 doses per day. 25 tablet 11   No facility-administered medications prior to visit.    Allergies  Allergen Reactions   Sumatriptan Shortness Of Breath    REACTION: sob, headache scalp sensitivity   Penicillins Itching   Oxycodone Itching        Objective:    Physical Exam Vitals and nursing note reviewed.  Constitutional:      General: He is not in acute distress.    Appearance: Normal appearance.  HENT:     Head: Normocephalic.     Right Ear: Tympanic membrane and ear canal normal.     Left Ear: Tympanic membrane and ear canal normal.     Nose:     Right Sinus: No frontal sinus tenderness.     Left Sinus: No frontal sinus tenderness.     Mouth/Throat:     Mouth: Mucous membranes are moist.     Pharynx: No pharyngeal swelling, oropharyngeal exudate or posterior oropharyngeal erythema.     Tonsils: No tonsillar exudate or tonsillar abscesses.  Cardiovascular:     Rate and Rhythm: Normal rate and regular rhythm.  Pulmonary:     Effort: Pulmonary effort is normal.      Breath sounds: Normal breath sounds.  Musculoskeletal:        General: Normal range of motion.     Cervical back: Normal range of motion.  Skin:    General: Skin is warm and dry.  Neurological:     Mental Status: He is alert and oriented to person, place, and time.  Psychiatric:        Mood and Affect:  Mood normal.    BP 121/61   Pulse 82   Temp (!) 97.4 F (36.3 C) (Temporal)   Ht 6\' 1"  (1.854 m)   Wt 213 lb 9.6 oz (96.9 kg)   SpO2 94%   BMI 28.18 kg/m  Wt Readings from Last 3 Encounters:  10/20/21 213 lb 9.6 oz (96.9 kg)  09/20/21 214 lb 6.4 oz (97.3 kg)  09/02/21 215 lb 12.8 oz (97.9 kg)       Assessment & Plan:   Problem List Items Addressed This Visit       Other   Persistent cough - Primary    Steroid shot given today, continuous cough for 10 days, prefers injection over prednisone, advised to call back after a few days if still not better and can send oral dose. Continue hydrating with at least 64oz water daily, continue tylenol or advil tid prn.       Meds ordered this encounter  Medications   methylPREDNISolone acetate (DEPO-MEDROL) injection 80 mg

## 2021-10-26 ENCOUNTER — Telehealth: Payer: Self-pay | Admitting: Physical Medicine and Rehabilitation

## 2021-10-26 NOTE — Telephone Encounter (Signed)
Patient called needing to R/S his appointment.  Patient said he want to R/S his appointment for a month out. The number to contact patient is [phone]

## 2021-11-01 ENCOUNTER — Ambulatory Visit: Payer: Medicare HMO | Admitting: Physical Medicine and Rehabilitation

## 2021-11-01 NOTE — Telephone Encounter (Signed)
PA Case: [id number], Status: Approved, Coverage Starts on: 11/14/2020 12:00:00 AM, Coverage Ends on: 11/13/2022 12:00:00 AM. Questions? Contact [phone].

## 2021-11-03 ENCOUNTER — Ambulatory Visit: Payer: Medicare HMO | Admitting: Physician Assistant

## 2021-11-03 ENCOUNTER — Encounter: Payer: Self-pay | Admitting: Physician Assistant

## 2021-11-03 VITALS — BP 128/58 | HR 76 | Temp 97.6°F | Ht 73.0 in | Wt 217.2 lb

## 2021-11-03 DIAGNOSIS — S0990XA Unspecified injury of head, initial encounter: Secondary | ICD-10-CM | POA: Diagnosis not present

## 2021-11-03 DIAGNOSIS — R300 Dysuria: Secondary | ICD-10-CM | POA: Diagnosis not present

## 2021-11-03 DIAGNOSIS — R6889 Other general symptoms and signs: Secondary | ICD-10-CM | POA: Diagnosis not present

## 2021-11-03 LAB — POCT URINALYSIS DIPSTICK
Bilirubin, UA: 1
Glucose, UA: NEGATIVE
Nitrite, UA: POSITIVE
Protein, UA: POSITIVE — AB
Spec Grav, UA: 1.025 (ref 1.010–1.025)
Urobilinogen, UA: NEGATIVE E.U./dL — AB
pH, UA: 6 (ref 5.0–8.0)

## 2021-11-03 MED ORDER — TAMSULOSIN HCL 0.4 MG PO CAPS: 0.4000 mg | ORAL_CAPSULE | Freq: Every day | ORAL | 1 refills | Status: DC

## 2021-11-03 MED ORDER — CIPROFLOXACIN HCL 500 MG PO TABS: 500.0000 mg | ORAL_TABLET | Freq: Two times a day (BID) | ORAL | 0 refills | Status: DC

## 2021-11-03 NOTE — Progress Notes (Signed)
Robert NEILAN is a 75 y.o. male here for a new problem.   History of Present Illness:   Chief Complaint  Patient presents with   Urinary Tract Infection    Pt c/o UTI symptoms, urgency, not being able to void, pain and burning when urinating   Headache    HPI  Flu like symptoms Over the past few days has had some flu-like symptoms. Went to the [place] yesterday and had negative flu, COVID, RSV testing and negative CXR. Has had cough, congestion. Was seen in our office on 12/7 for persistent cough and was given 80 mg depomedrol. Responded well to this per his report. Daughter reports that he is SOB. He states that this is not exertional. Denies chest pain, jaw pain.  Dysuria Hx of recurrent prostate infection. Starting to have significant pain when he urinates, decreased urination, limited amount of urine coming out when he urinates x 2 days. Doesn't feel like he is drinking enough. Denies fever, chills.  Fall; Head Trauma On Thursday of last week, he was sitting on a bucket in a crawl space. The bucket gave out and he hit the back of his head on concrete. Felt like his visoin has been "off" -- not double or blurry though. His headache has been 8/10. Has taken tylenol and ibuprofen without relief of symptoms. His daughter is with him today and reports that he did not tell any of his family members about this. Denies LOC. Has felt unsteady on his feet since this happened. Was encouraged to go to the ER by his daughter but he refused.   Past Medical History:  Diagnosis Date   Arthritis    CAD (coronary artery disease) 10/10/2011   NSTEMI with DES to the proximal RCA following flow wire evaluation. EF is normal.    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   CORONARY ANGIOPLASTY WITH STENT PLACEMENT  10/13/2011   DES to the proximal RCA. Dr Martinique   FRACTIONAL FLOW RESERVE WIRE Right 10/13/2011   Procedure: [place];  Surgeon: Peter M Martinique, MD;  Location: [facility] CATH LAB;  Service: Cardiovascular;  Laterality: Right;   HERNIA REPAIR     INGUINAL HERNIA REPAIR  10/23/2012   Procedure: HERNIA REPAIR INGUINAL ADULT;  Surgeon: Odis Hollingshead, MD;  Location: [place];  Service: General;  Laterality: Left;  Left lower quadrant; left inguinal hernia repair with mesh   INSERTION OF MESH  10/23/2012   Procedure: INSERTION OF MESH;  Surgeon: Odis Hollingshead, MD;  Location: [place];  Service: General;  Laterality: Left;  Left lower quadrant   LEFT HEART CATHETERIZATION WITH CORONARY ANGIOGRAM N/A 10/13/2011   Procedure: LEFT HEART CATHETERIZATION WITH CORONARY ANGIOGRAM;  Surgeon: Peter M Martinique, MD;  Location: [facility] CATH LAB;  Service: Cardiovascular;  Laterality: N/A;   PERCUTANEOUS CORONARY STENT INTERVENTION (PCI-S) Right 10/13/2011   Procedure: PERCUTANEOUS CORONARY STENT INTERVENTION (PCI-S);  Surgeon: Peter M Martinique, MD;  Location: [facility] CATH LAB;  Service: Cardiovascular;  Laterality: Right;   SHOULDER ARTHROSCOPY W/ ROTATOR CUFF REPAIR Left  1990's   TOTAL HIP ARTHROPLASTY Left 05/14/2021   Procedure: LEFT TOTAL HIP ARTHROPLASTY ANTERIOR APPROACH;  Surgeon: Mcarthur Rossetti, MD;  Location: [facility];  Service: Orthopedics;  Laterality: Left;   TRIGGER FINGER RELEASE Left    3rd and 4th digits    Family History  Problem Relation Age of Onset   Cancer Mother        lung   Heart disease Father        heart attack    Cancer Father        colon   Migraines Neg Hx    Headache Neg Hx     Allergies  Allergen Reactions   Sumatriptan Shortness Of Breath    REACTION: sob, headache scalp sensitivity   Penicillins Itching   Oxycodone Itching    Current Medications:   Current Outpatient Medications:    Ascorbic Acid (VITAMIN C) 1000 MG tablet, Take 1,000 mg by mouth daily., Disp: , Rfl:    aspirin 81 MG chewable tablet, Chew 1 tablet (81 mg total) by mouth 2 (two) times daily., Disp: 35 tablet, Rfl: 0   azelastine (ASTELIN) 0.1 % nasal spray, Place 1 spray into both nostrils 2 (two) times daily. Use in each nostril as directed, Disp: , Rfl:    B Complex-C (SUPER B COMPLEX PO), Take 1 tablet by mouth daily., Disp: , Rfl:    brimonidine (ALPHAGAN) 0.2 % ophthalmic solution, Place 1 drop into the right eye in the morning and at bedtime., Disp: , Rfl:    ciprofloxacin (CIPRO) 500 MG tablet, Take 1 tablet (500 mg total) by mouth 2 (two) times daily for 28 days., Disp: 56 tablet, Rfl: 0   EMGALITY 120 MG/ML SOAJ, INJECT 120 MG INTO THE SKIN EVERY 30 (THIRTY) DAYS., Disp: 1 mL, Rfl: 11   fluticasone (FLONASE) 50 MCG/ACT nasal spray, Place 1 spray into both nostrils daily as needed for allergies., Disp: , Rfl:    latanoprost (XALATAN) 0.005 % ophthalmic solution, Place 1 drop into the right eye at bedtime., Disp: , Rfl:    Multiple Vitamins-Minerals (MULTIVITAMINS THER. W/MINERALS) TABS, Take 1 tablet by mouth daily., Disp: , Rfl:    Multiple Vitamins-Minerals (ZINC PO), Take 50 mg by mouth daily., Disp: , Rfl:     NURTEC 75 MG TBDP, TAKE 75 MG BY MOUTH DAILY AS NEEDED. FOR MIGRAINES. TAKE AS CLOSE TO ONSET OF MIGRAINE AS POSSIBLE. ONE DAILY MAXIMUM., Disp: 10 tablet, Rfl: 6   pantoprazole (PROTONIX) 40 MG tablet, Take 40 mg by mouth daily., Disp: , Rfl:    Propylene Glycol (SYSTANE BALANCE) 0.6 % SOLN, Place 1 drop into both eyes 3 (three) times daily as needed (dry eyes)., Disp: , Rfl:    rosuvastatin (CRESTOR) 40 MG  tablet, Take 40 mg by mouth daily., Disp: , Rfl:    tamsulosin (FLOMAX) 0.4 MG CAPS capsule, Take 1 capsule (0.4 mg total) by mouth daily., Disp: 30 capsule, Rfl: 1   traMADol (ULTRAM) 50 MG tablet, Take 50 mg by mouth daily., Disp: , Rfl:    vitamin E 180 MG (400 UNITS) capsule, Take 400 Units by mouth daily., Disp: , Rfl:    nitroGLYCERIN (NITROSTAT) 0.4 MG SL tablet, Place 1 tablet (0.4 mg total) under the tongue every 5 (five) minutes as needed for chest pain. Do not exceed more than 3 doses per day., Disp: 25 tablet, Rfl: 11   Review of Systems:   ROS Negative unless otherwise specified per HPI.  Vitals:   Vitals:   11/03/21 1610  BP: (!) 128/58  Pulse: 76  Temp: 97.6 F (36.4 C)  TempSrc: Temporal  SpO2: 99%  Weight: 217 lb 3.2 oz (98.5 kg)  Height: 6\' 1"  (1.854 m)     Body mass index is 28.66 kg/m.  Physical Exam:   Physical Exam Vitals and nursing note reviewed.  Constitutional:      General: He is not in acute distress.    Appearance: He is well-developed. He is not ill-appearing or toxic-appearing.  Cardiovascular:     Rate and Rhythm: Normal rate and regular rhythm.     Pulses: Normal pulses.     Heart sounds: Normal heart sounds, S1 normal and S2 normal.  Pulmonary:     Effort: Pulmonary effort is normal.     Breath sounds: Normal breath sounds.  Skin:    General: Skin is warm and dry.  Neurological:     General: No focal deficit present.     Mental Status: He is alert.     GCS: GCS eye subscore is 4. GCS verbal subscore is 5. GCS motor  subscore is 6.     Cranial Nerves: Cranial nerves 2-12 are intact.     Sensory: Sensation is intact.     Motor: Motor function is intact.     Coordination: Coordination is intact.     Comments: Unsteady gait  Psychiatric:        Speech: Speech normal.        Behavior: Behavior normal. Behavior is cooperative.   Results for orders placed or performed in visit on 11/03/21  POCT urinalysis dipstick  Result Value Ref Range   Color, UA yellow    Clarity, UA cloudy    Glucose, UA Negative Negative   Bilirubin, UA 1    Ketones, UA 5mg     Spec Grav, UA 1.025 1.010 - 1.025   Blood, UA 3+    pH, UA 6.0 5.0 - 8.0   Protein, UA Positive (A) Negative   Urobilinogen, UA negative (A) 0.2 or 1.0 E.U./dL   Nitrite, UA positive    Leukocytes, UA Large (3+) (A) Negative   Appearance     Odor      Assessment and Plan:   Dysuria Concern for possible prostatitis given hx and current flu-like symptoms Sx are consistent with past episodes of prostatitis UA positive for blood, nitrite and leuks Will start oral cipro 500 mg BID x 4 weeks Follow-up with me or PCP in 4 weeks, sooner if concerns  Traumatic injury of head, initial encounter He is quite unsteady on his feet which daughter reports is abnormal for him, has ongoing HA and "vision changes" He is refusing to go to the ER Recommend stat head CT for further  evaluation Discussed that if he has new/worsening symptoms, he must go to the ER  Flu-like symptoms Vitals stable in office Suspect viral illness, or just part of his prostatitis infection Will obtain CBC and CMP -- labs ordered for future as our phlebotomist is out of the office today -- obtain at [facility] tomorrow If significant WBC or other concerns, will likely refer to ER  Time spent with patient today was 52 minutes which consisted of chart review, discussing diagnosis, work up, treatment answering questions and documentation.  Inda Coke, PA-C

## 2021-11-03 NOTE — Patient Instructions (Addendum)
It was great to see you!  I have concerns that you have a prostate infection -Start oral cipro antibiotic twice daily -You will take this for 4 WEEKS -Start daily flomax/tamsulosin to relax your bladder to help you get your urine out -HYDRATE -Please schedule a follow-up appointment with me or Dr. Jerline Pain in 4 weeks  You will be contacted about scheduling your head CT IF ANY NEW/WORSENING SYMPTOMS IN THE MEANTIME --> GO TO THE ER  I have put in some blood work orders for you. To get your blood work, you can walk in at the [facility] location without a scheduled appointment.  The address is [address]. It is across the street from [facility]. Hours of operation are M-F 8:30am to 5:00pm. Please note that they are closed for lunch between 12:30 and 1:00pm.   Take care,  Inda Coke PA-C

## 2021-11-04 ENCOUNTER — Ambulatory Visit (HOSPITAL_COMMUNITY): Payer: Medicare HMO

## 2021-11-04 ENCOUNTER — Emergency Department (HOSPITAL_COMMUNITY): Payer: Medicare HMO

## 2021-11-04 ENCOUNTER — Encounter (HOSPITAL_COMMUNITY): Payer: Self-pay | Admitting: Emergency Medicine

## 2021-11-04 ENCOUNTER — Inpatient Hospital Stay (HOSPITAL_COMMUNITY)
Admission: EM | Admit: 2021-11-04 | Discharge: 2021-11-06 | DRG: 872 | Disposition: A | Discharge status: Home / Self Care | Payer: Medicare HMO | Attending: Family Medicine | Admitting: Family Medicine

## 2021-11-04 ENCOUNTER — Other Ambulatory Visit (INDEPENDENT_AMBULATORY_CARE_PROVIDER_SITE_OTHER): Payer: Medicare HMO

## 2021-11-04 ENCOUNTER — Telehealth: Payer: Self-pay | Admitting: *Deleted

## 2021-11-04 ENCOUNTER — Other Ambulatory Visit: Payer: Self-pay

## 2021-11-04 DIAGNOSIS — Z8249 Family history of ischemic heart disease and other diseases of the circulatory system: Secondary | ICD-10-CM | POA: Diagnosis not present

## 2021-11-04 DIAGNOSIS — A419 Sepsis, unspecified organism: Secondary | ICD-10-CM | POA: Diagnosis not present

## 2021-11-04 DIAGNOSIS — G4730 Sleep apnea, unspecified: Secondary | ICD-10-CM | POA: Diagnosis present

## 2021-11-04 DIAGNOSIS — N451 Epididymitis: Secondary | ICD-10-CM | POA: Diagnosis not present

## 2021-11-04 DIAGNOSIS — E1169 Type 2 diabetes mellitus with other specified complication: Secondary | ICD-10-CM | POA: Diagnosis present

## 2021-11-04 DIAGNOSIS — G43711 Chronic migraine without aura, intractable, with status migrainosus: Secondary | ICD-10-CM | POA: Diagnosis present

## 2021-11-04 DIAGNOSIS — I2510 Atherosclerotic heart disease of native coronary artery without angina pectoris: Secondary | ICD-10-CM | POA: Diagnosis not present

## 2021-11-04 DIAGNOSIS — I252 Old myocardial infarction: Secondary | ICD-10-CM | POA: Diagnosis not present

## 2021-11-04 DIAGNOSIS — K219 Gastro-esophageal reflux disease without esophagitis: Secondary | ICD-10-CM | POA: Diagnosis present

## 2021-11-04 DIAGNOSIS — N50811 Right testicular pain: Secondary | ICD-10-CM | POA: Diagnosis not present

## 2021-11-04 DIAGNOSIS — S0990XA Unspecified injury of head, initial encounter: Secondary | ICD-10-CM

## 2021-11-04 DIAGNOSIS — Z87891 Personal history of nicotine dependence: Secondary | ICD-10-CM | POA: Diagnosis not present

## 2021-11-04 DIAGNOSIS — Z79899 Other long term (current) drug therapy: Secondary | ICD-10-CM

## 2021-11-04 DIAGNOSIS — E1165 Type 2 diabetes mellitus with hyperglycemia: Secondary | ICD-10-CM | POA: Diagnosis present

## 2021-11-04 DIAGNOSIS — N419 Inflammatory disease of prostate, unspecified: Secondary | ICD-10-CM | POA: Diagnosis present

## 2021-11-04 DIAGNOSIS — E871 Hypo-osmolality and hyponatremia: Secondary | ICD-10-CM | POA: Diagnosis not present

## 2021-11-04 DIAGNOSIS — Z20822 Contact with and (suspected) exposure to covid-19: Secondary | ICD-10-CM | POA: Diagnosis present

## 2021-11-04 DIAGNOSIS — E785 Hyperlipidemia, unspecified: Secondary | ICD-10-CM | POA: Diagnosis not present

## 2021-11-04 DIAGNOSIS — N410 Acute prostatitis: Secondary | ICD-10-CM | POA: Diagnosis present

## 2021-11-04 DIAGNOSIS — R300 Dysuria: Secondary | ICD-10-CM | POA: Diagnosis not present

## 2021-11-04 DIAGNOSIS — N400 Enlarged prostate without lower urinary tract symptoms: Secondary | ICD-10-CM | POA: Diagnosis present

## 2021-11-04 DIAGNOSIS — N453 Epididymo-orchitis: Secondary | ICD-10-CM | POA: Diagnosis present

## 2021-11-04 DIAGNOSIS — J9811 Atelectasis: Secondary | ICD-10-CM | POA: Diagnosis not present

## 2021-11-04 DIAGNOSIS — I10 Essential (primary) hypertension: Secondary | ICD-10-CM | POA: Diagnosis not present

## 2021-11-04 DIAGNOSIS — N390 Urinary tract infection, site not specified: Secondary | ICD-10-CM | POA: Diagnosis present

## 2021-11-04 DIAGNOSIS — Z7982 Long term (current) use of aspirin: Secondary | ICD-10-CM | POA: Diagnosis not present

## 2021-11-04 DIAGNOSIS — Z955 Presence of coronary angioplasty implant and graft: Secondary | ICD-10-CM

## 2021-11-04 DIAGNOSIS — N5089 Other specified disorders of the male genital organs: Secondary | ICD-10-CM | POA: Diagnosis not present

## 2021-11-04 DIAGNOSIS — N433 Hydrocele, unspecified: Secondary | ICD-10-CM | POA: Diagnosis not present

## 2021-11-04 DIAGNOSIS — Z85828 Personal history of other malignant neoplasm of skin: Secondary | ICD-10-CM | POA: Diagnosis not present

## 2021-11-04 LAB — COMPREHENSIVE METABOLIC PANEL
ALT: 23 U/L (ref 0–53)
ALT: 27 U/L (ref 0–44)
AST: 15 U/L (ref 0–37)
AST: 20 U/L (ref 15–41)
Albumin: 3.6 g/dL (ref 3.5–5.2)
Albumin: 3.6 g/dL (ref 3.5–5.0)
Alkaline Phosphatase: 76 U/L (ref 39–117)
Alkaline Phosphatase: 76 U/L (ref 38–126)
Anion gap: 10 (ref 5–15)
BUN: 7 mg/dL (ref 6–23)
BUN: 8 mg/dL (ref 8–23)
CO2: 28 mEq/L (ref 19–32)
CO2: 24 mmol/L (ref 22–32)
Calcium: 9 mg/dL (ref 8.4–10.5)
Calcium: 8.7 mg/dL — ABNORMAL LOW (ref 8.9–10.3)
Chloride: 97 mEq/L (ref 96–112)
Chloride: 95 mmol/L — ABNORMAL LOW (ref 98–111)
Creatinine, Ser: 0.75 mg/dL (ref 0.40–1.50)
Creatinine, Ser: 0.71 mg/dL (ref 0.61–1.24)
GFR, Estimated: >60 mL/min (ref >60)
GFR: 86.8 mL/min (ref >60.00)
Glucose, Bld: 171 mg/dL — ABNORMAL HIGH (ref 70–99)
Glucose, Bld: 241 mg/dL — ABNORMAL HIGH (ref 70–99)
Potassium: 4 mEq/L (ref 3.5–5.1)
Potassium: 3.8 mmol/L (ref 3.5–5.1)
Sodium: 131 mEq/L — ABNORMAL LOW (ref 135–145)
Sodium: 129 mmol/L — ABNORMAL LOW (ref 135–145)
Total Bilirubin: 0.8 mg/dL (ref 0.2–1.2)
Total Bilirubin: 0.8 mg/dL (ref 0.3–1.2)
Total Protein: 6.8 g/dL (ref 6.0–8.3)
Total Protein: 7.2 g/dL (ref 6.5–8.1)

## 2021-11-04 LAB — CBC
HCT: 36.7 % — ABNORMAL LOW (ref 39.0–52.0)
Hemoglobin: 12.1 g/dL — ABNORMAL LOW (ref 13.0–17.0)
MCH: 31 pg (ref 26.0–34.0)
MCHC: 33 g/dL (ref 30.0–36.0)
MCV: 94.1 fL (ref 80.0–100.0)
Platelets: 163 10*3/uL (ref 150–400)
RBC: 3.9 MIL/uL — ABNORMAL LOW (ref 4.22–5.81)
RDW: 14.6 % (ref 11.5–15.5)
WBC: 18.8 10*3/uL — ABNORMAL HIGH (ref 4.0–10.5)
nRBC: 0 % (ref 0.0–0.2)

## 2021-11-04 LAB — URINALYSIS, ROUTINE W REFLEX MICROSCOPIC
Bilirubin Urine: NEGATIVE
Glucose, UA: 500 mg/dL — AB
Nitrite: NEGATIVE
Specific Gravity, Urine: >=1.03 (ref 1.005–1.030)
WBC, UA: >50 WBC/hpf — ABNORMAL HIGH (ref 0–5)
pH: 6 (ref 5.0–8.0)

## 2021-11-04 LAB — CBC WITH DIFFERENTIAL/PLATELET
Abs Immature Granulocytes: 0.23 10*3/uL — ABNORMAL HIGH (ref 0.00–0.07)
Basophils Absolute: 0 10*3/uL (ref 0.0–0.1)
Basophils Absolute: 0 10*3/uL (ref 0.0–0.1)
Basophils Relative: 0.2 % (ref 0.0–3.0)
Basophils Relative: 0 %
Eosinophils Absolute: 0 10*3/uL (ref 0.0–0.7)
Eosinophils Absolute: 0 10*3/uL (ref 0.0–0.5)
Eosinophils Relative: 0.1 % (ref 0.0–5.0)
Eosinophils Relative: 0 %
HCT: 37.6 % — ABNORMAL LOW (ref 39.0–52.0)
HCT: 38.3 % — ABNORMAL LOW (ref 39.0–52.0)
Hemoglobin: 12.5 g/dL — ABNORMAL LOW (ref 13.0–17.0)
Hemoglobin: 12.8 g/dL — ABNORMAL LOW (ref 13.0–17.0)
Immature Granulocytes: 1 %
Lymphocytes Relative: 7.4 % — ABNORMAL LOW (ref 12.0–46.0)
Lymphocytes Relative: 7 %
Lymphs Abs: 1.3 10*3/uL (ref 0.7–4.0)
Lymphs Abs: 1.3 10*3/uL (ref 0.7–4.0)
MCH: 30.8 pg (ref 26.0–34.0)
MCHC: 33.3 g/dL (ref 30.0–36.0)
MCHC: 33.4 g/dL (ref 30.0–36.0)
MCV: 91 fl (ref 78.0–100.0)
MCV: 92.3 fL (ref 80.0–100.0)
Monocytes Absolute: 1.4 10*3/uL — ABNORMAL HIGH (ref 0.1–1.0)
Monocytes Absolute: 1.1 10*3/uL — ABNORMAL HIGH (ref 0.1–1.0)
Monocytes Relative: 7.5 % (ref 3.0–12.0)
Monocytes Relative: 6 %
Neutro Abs: 15.4 10*3/uL — ABNORMAL HIGH (ref 1.4–7.7)
Neutro Abs: 16.8 10*3/uL — ABNORMAL HIGH (ref 1.7–7.7)
Neutrophils Relative %: 84.8 % — ABNORMAL HIGH (ref 43.0–77.0)
Neutrophils Relative %: 86 %
Platelets: 177 10*3/uL (ref 150.0–400.0)
Platelets: 169 10*3/uL (ref 150–400)
RBC: 4.13 Mil/uL — ABNORMAL LOW (ref 4.22–5.81)
RBC: 4.15 MIL/uL — ABNORMAL LOW (ref 4.22–5.81)
RDW: 15.1 % (ref 11.5–15.5)
RDW: 14.6 % (ref 11.5–15.5)
WBC: 18.2 10*3/uL (ref 4.0–10.5)
WBC: 19.5 10*3/uL — ABNORMAL HIGH (ref 4.0–10.5)
nRBC: 0 % (ref 0.0–0.2)

## 2021-11-04 LAB — LACTIC ACID, PLASMA
Lactic Acid, Venous: 1.6 mmol/L (ref 0.5–1.9)
Lactic Acid, Venous: 1.6 mmol/L (ref 0.5–1.9)

## 2021-11-04 LAB — RESP PANEL BY RT-PCR (FLU A&B, COVID) ARPGX2
Influenza A by PCR: NEGATIVE
Influenza B by PCR: NEGATIVE
SARS Coronavirus 2 by RT PCR: NEGATIVE

## 2021-11-04 LAB — CREATININE, SERUM
Creatinine, Ser: 0.76 mg/dL (ref 0.61–1.24)
GFR, Estimated: >60 mL/min (ref >60)

## 2021-11-04 LAB — APTT: aPTT: 27 seconds (ref 24–36)

## 2021-11-04 LAB — GLUCOSE, CAPILLARY: Glucose-Capillary: 197 mg/dL — ABNORMAL HIGH (ref 70–99)

## 2021-11-04 LAB — CBG MONITORING, ED: Glucose-Capillary: 155 mg/dL — ABNORMAL HIGH (ref 70–99)

## 2021-11-04 LAB — PROTIME-INR
INR: 1.1 (ref 0.8–1.2)
Prothrombin Time: 13.7 seconds (ref 11.4–15.2)

## 2021-11-04 MED ORDER — ACETAMINOPHEN 325 MG PO TABS: 650.0000 mg | ORAL_TABLET | Freq: Four times a day (QID) | ORAL | Status: DC | PRN

## 2021-11-04 MED ORDER — ACETAMINOPHEN 650 MG RE SUPP: 650.0000 mg | Freq: Four times a day (QID) | RECTAL | Status: DC | PRN

## 2021-11-04 MED ORDER — BRIMONIDINE TARTRATE 0.2 % OP SOLN
1.0000 [drp] | Freq: Two times a day (BID) | OPHTHALMIC | Status: DC
  Administered 2021-11-04 – 2021-11-06 (×4): 1 [drp] via OPHTHALMIC
  Filled 2021-11-04: qty 5

## 2021-11-04 MED ORDER — TRAMADOL HCL 50 MG PO TABS
50.0000 mg | ORAL_TABLET | Freq: Every day | ORAL | Status: DC
  Administered 2021-11-05 – 2021-11-06 (×2): 50 mg via ORAL
  Filled 2021-11-04 (×3): qty 1

## 2021-11-04 MED ORDER — ONDANSETRON HCL 4 MG PO TABS: 4.0000 mg | ORAL_TABLET | Freq: Four times a day (QID) | ORAL | Status: DC | PRN

## 2021-11-04 MED ORDER — PROPYLENE GLYCOL 0.6 % OP SOLN: 1.0000 [drp] | Freq: Three times a day (TID) | OPHTHALMIC | Status: DC | PRN

## 2021-11-04 MED ORDER — LEVOFLOXACIN IN D5W 500 MG/100ML IV SOLN
500.0000 mg | Freq: Once | INTRAVENOUS | Status: AC
  Administered 2021-11-04: 14:00:00 500 mg via INTRAVENOUS
  Filled 2021-11-04: qty 100

## 2021-11-04 MED ORDER — FLUTICASONE PROPIONATE 50 MCG/ACT NA SUSP
1.0000 | Freq: Every day | NASAL | Status: DC | PRN
  Filled 2021-11-04: qty 16

## 2021-11-04 MED ORDER — PANTOPRAZOLE SODIUM 40 MG PO TBEC
40.0000 mg | DELAYED_RELEASE_TABLET | Freq: Every day | ORAL | Status: DC
  Administered 2021-11-04 – 2021-11-06 (×3): 40 mg via ORAL
  Filled 2021-11-04 (×3): qty 1

## 2021-11-04 MED ORDER — NITROGLYCERIN 0.4 MG SL SUBL: 0.4000 mg | SUBLINGUAL_TABLET | SUBLINGUAL | Status: DC | PRN

## 2021-11-04 MED ORDER — ENOXAPARIN SODIUM 40 MG/0.4ML IJ SOSY
40.0000 mg | PREFILLED_SYRINGE | INTRAMUSCULAR | Status: DC
  Administered 2021-11-04 – 2021-11-05 (×2): 40 mg via SUBCUTANEOUS
  Filled 2021-11-04 (×2): qty 0.4

## 2021-11-04 MED ORDER — LACTATED RINGERS IV BOLUS (SEPSIS)
1000.0000 mL | Freq: Once | INTRAVENOUS | Status: AC
  Administered 2021-11-04: 14:00:00 1000 mL via INTRAVENOUS

## 2021-11-04 MED ORDER — LACTATED RINGERS IV BOLUS (SEPSIS): 1000.0000 mL | Freq: Once | INTRAVENOUS | Status: DC

## 2021-11-04 MED ORDER — DOCUSATE SODIUM 100 MG PO CAPS
100.0000 mg | ORAL_CAPSULE | Freq: Two times a day (BID) | ORAL | Status: DC
  Administered 2021-11-04 – 2021-11-06 (×4): 100 mg via ORAL
  Filled 2021-11-04 (×4): qty 1

## 2021-11-04 MED ORDER — TAMSULOSIN HCL 0.4 MG PO CAPS
0.4000 mg | ORAL_CAPSULE | Freq: Every day | ORAL | Status: DC
  Administered 2021-11-05 – 2021-11-06 (×2): 0.4 mg via ORAL
  Filled 2021-11-04 (×2): qty 1

## 2021-11-04 MED ORDER — ASPIRIN 81 MG PO CHEW
81.0000 mg | CHEWABLE_TABLET | Freq: Two times a day (BID) | ORAL | Status: DC
  Administered 2021-11-04 – 2021-11-06 (×4): 81 mg via ORAL
  Filled 2021-11-04 (×4): qty 1

## 2021-11-04 MED ORDER — SODIUM CHLORIDE 0.9 % IV SOLN: INTRAVENOUS | Status: DC

## 2021-11-04 MED ORDER — POLYVINYL ALCOHOL 1.4 % OP SOLN
1.0000 [drp] | Freq: Three times a day (TID) | OPHTHALMIC | Status: DC | PRN
  Filled 2021-11-04: qty 15

## 2021-11-04 MED ORDER — INSULIN ASPART 100 UNIT/ML IJ SOLN
0.0000 [IU] | Freq: Three times a day (TID) | INTRAMUSCULAR | Status: DC
  Administered 2021-11-04: 18:00:00 2 [IU] via SUBCUTANEOUS
  Administered 2021-11-05: 12:00:00 3 [IU] via SUBCUTANEOUS
  Administered 2021-11-05 (×2): 2 [IU] via SUBCUTANEOUS
  Administered 2021-11-06: 18:00:00 3 [IU] via SUBCUTANEOUS
  Administered 2021-11-06: 13:00:00 1 [IU] via SUBCUTANEOUS
  Administered 2021-11-06: 08:00:00 2 [IU] via SUBCUTANEOUS
  Filled 2021-11-04: qty 0.09

## 2021-11-04 MED ORDER — LEVOFLOXACIN IN D5W 500 MG/100ML IV SOLN
500.0000 mg | INTRAVENOUS | Status: DC
  Administered 2021-11-05 – 2021-11-06 (×2): 500 mg via INTRAVENOUS
  Filled 2021-11-04 (×2): qty 100

## 2021-11-04 MED ORDER — LATANOPROST 0.005 % OP SOLN
1.0000 [drp] | Freq: Every day | OPHTHALMIC | Status: DC
  Administered 2021-11-04 – 2021-11-05 (×2): 1 [drp] via OPHTHALMIC
  Filled 2021-11-04: qty 2.5

## 2021-11-04 MED ORDER — ROSUVASTATIN CALCIUM 20 MG PO TABS
40.0000 mg | ORAL_TABLET | Freq: Every day | ORAL | Status: DC
  Administered 2021-11-04 – 2021-11-06 (×3): 40 mg via ORAL
  Filled 2021-11-04 (×3): qty 2

## 2021-11-04 MED ORDER — ONDANSETRON HCL 4 MG/2ML IJ SOLN: 4.0000 mg | Freq: Four times a day (QID) | INTRAMUSCULAR | Status: DC | PRN

## 2021-11-04 NOTE — ED Triage Notes (Signed)
Patient was sent by Dr. Jerline Pain for elevated WBC of 18. States they went to Dr yesterday and diagnosed with prostatitis. Daughter states patient has had frequent urination, foul ordor to urine, and feeling like he has the flu. Patient states right testicle is swollen and he has been feeling very dizzy and having shortness of breath.

## 2021-11-04 NOTE — Sepsis Progress Note (Signed)
Notified provider of need to order antibiotics.   

## 2021-11-04 NOTE — Plan of Care (Signed)
  Problem: Clinical Measurements: Goal: Respiratory complications will improve Outcome: Progressing Goal: Cardiovascular complication will be avoided Outcome: Progressing   

## 2021-11-04 NOTE — ED Provider Notes (Signed)
[facility] EMERGENCY DEPARTMENT Provider Note  CSN: [id number] Arrival date & time: 11/04/21 1110    History Chief Complaint  Patient presents with   Testicle Pain    Robert [name] is a 75 y.o. male sent to the ED by PCP. He was there yesterday for dysuria, foul smelling urine and urinary frequency with general malaise, fevers and dizziness. He has had a persistent headache recently as well. He was diagnosed with prostatitis yesterday and prescribed cipro (has had a dose last night and this morning). He reported to PCP that he had fallen off a bucket about 2 weeks ago hitting his head and was also scheduled for an outpatient head CT today. He was back to PCP office this morning for labs and was called due to leukocytosis and told to come to the ED. He now also reports increased pain and swelling now in his R testicle.    Past Medical History:  Diagnosis Date   Arthritis    CAD (coronary artery disease) 10/10/2011   NSTEMI with DES to the proximal RCA following flow wire evaluation. EF is normal.    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   CORONARY ANGIOPLASTY WITH STENT PLACEMENT  10/13/2011   DES to the proximal RCA. Dr Martinique   FRACTIONAL FLOW RESERVE WIRE Right 10/13/2011   Procedure: [place];  Surgeon: Peter M Martinique, MD;  Location: [facility] CATH LAB;  Service: Cardiovascular;  Laterality: Right;    HERNIA REPAIR     INGUINAL HERNIA REPAIR  10/23/2012   Procedure: HERNIA REPAIR INGUINAL ADULT;  Surgeon: Odis Hollingshead, MD;  Location: [facility];  Service: General;  Laterality: Left;  Left lower quadrant; left inguinal hernia repair with mesh   INSERTION OF MESH  10/23/2012   Procedure: INSERTION OF MESH;  Surgeon: Odis Hollingshead, MD;  Location: [facility];  Service: General;  Laterality: Left;  Left lower quadrant   LEFT HEART CATHETERIZATION WITH CORONARY ANGIOGRAM N/A 10/13/2011   Procedure: LEFT HEART CATHETERIZATION WITH CORONARY ANGIOGRAM;  Surgeon: Peter M Martinique, MD;  Location: [facility] CATH LAB;  Service: Cardiovascular;  Laterality: N/A;   PERCUTANEOUS CORONARY STENT INTERVENTION (PCI-S) Right 10/13/2011   Procedure: PERCUTANEOUS CORONARY STENT INTERVENTION (PCI-S);  Surgeon: Peter M Martinique, MD;  Location: [facility] CATH LAB;  Service: Cardiovascular;  Laterality: Right;   SHOULDER ARTHROSCOPY W/ ROTATOR CUFF REPAIR Left 1990's   TOTAL HIP ARTHROPLASTY Left 05/14/2021   Procedure: LEFT TOTAL HIP ARTHROPLASTY ANTERIOR APPROACH;  Surgeon: Mcarthur Rossetti, MD;  Location: [facility];  Service: Orthopedics;  Laterality: Left;   TRIGGER FINGER RELEASE Left    3rd and 4th digits    Family History  Problem Relation Age of Onset   Cancer Mother        lung   Heart  disease Father        heart attack    Cancer Father        colon   Migraines Neg Hx    Headache Neg Hx     Social History   Tobacco Use   Smoking status: Former    Packs/day: 2.00    Years: 20.00    Pack years: 40.00    Types: Cigarettes    Quit date: 11/14/1982    Years since quitting: 39.0   Smokeless tobacco: Never  Vaping Use   Vaping Use: Never used  Substance Use Topics   Alcohol use: Not Currently    Comment: occ   Drug use: No     Home Medications Prior to Admission medications   Medication Sig Start Date End Date Taking? Authorizing Provider  Ascorbic Acid (VITAMIN C) 1000 MG tablet Take 1,000 mg by mouth  daily.    [provider]  aspirin 81 MG chewable tablet Chew 1 tablet (81 mg total) by mouth 2 (two) times daily. 05/15/21   Pete Pelt, PA-C  azelastine (ASTELIN) 0.1 % nasal spray Place 1 spray into both nostrils 2 (two) times daily. Use in each nostril as directed    [provider]  B Complex-C (SUPER B COMPLEX PO) Take 1 tablet by mouth daily.    [provider]  brimonidine (ALPHAGAN) 0.2 % ophthalmic solution Place 1 drop into the right eye in the morning and at bedtime.    [provider]  ciprofloxacin (CIPRO) 500 MG tablet Take 1 tablet (500 mg total) by mouth 2 (two) times daily for 28 days. 11/03/21 12/01/21  Worley, Aldona Bar, PA  EMGALITY 120 MG/ML SOAJ INJECT 120 MG INTO THE SKIN EVERY 30 (THIRTY) DAYS. 09/23/21   [name], MD  fluticasone (FLONASE) 50 MCG/ACT nasal spray Place 1 spray into both nostrils daily as needed for allergies.    [provider]  latanoprost (XALATAN) 0.005 % ophthalmic solution Place 1 drop into the right eye at bedtime.    [provider]  Multiple Vitamins-Minerals (MULTIVITAMINS THER. W/MINERALS) TABS Take 1 tablet by mouth daily.    [provider]  Multiple Vitamins-Minerals (ZINC PO) Take 50 mg by mouth daily.    [provider]  nitroGLYCERIN (NITROSTAT) 0.4 MG SL tablet Place 1 tablet (0.4 mg total) under the tongue every 5 (five) minutes as needed for chest pain. Do not exceed more than 3 doses per day. 05/20/21 08/18/21  Martinique, Peter M, MD  NURTEC 75 MG TBDP TAKE 75 MG BY MOUTH DAILY AS NEEDED. FOR MIGRAINES. TAKE AS CLOSE TO ONSET OF MIGRAINE AS POSSIBLE. ONE DAILY MAXIMUM. 09/20/21   [name], MD  pantoprazole (PROTONIX) 40 MG tablet Take 40 mg by mouth daily. 01/28/21   [provider]  Propylene Glycol (SYSTANE BALANCE) 0.6 % SOLN Place 1 drop into both eyes 3 (three) times daily as needed (dry eyes).    [provider]  rosuvastatin (CRESTOR)  40 MG tablet Take 40 mg by mouth daily. 01/28/21   [provider]  tamsulosin (FLOMAX) 0.4 MG CAPS capsule Take 1 capsule (0.4 mg total) by mouth daily. 11/03/21   Inda Coke, PA  traMADol (ULTRAM) 50 MG tablet Take 50 mg by mouth daily. 08/17/21   [provider]  vitamin E 180 MG (400 UNITS) capsule Take 400 Units by mouth daily.    [provider]     Allergies    Sumatriptan, Penicillins,  and Oxycodone   Review of Systems   Review of Systems A comprehensive review of systems was completed and negative except as noted in HPI.    Physical Exam BP (!) 147/68 (BP Location: Left Arm)    Pulse 75    Temp 99.1 F (37.3 C) (Oral)    Resp 16    Ht 6\' 1"  (1.854 m)    Wt 98.5 kg    SpO2 98%    BMI 28.66 kg/m   Physical Exam Vitals and nursing note reviewed.  Constitutional:      Appearance: Normal appearance.  HENT:     Head: Normocephalic and atraumatic.     Nose: Nose normal.     Mouth/Throat:     Mouth: Mucous membranes are moist.  Eyes:     Extraocular Movements: Extraocular movements intact.     Conjunctiva/sclera: Conjunctivae normal.  Cardiovascular:     Rate and Rhythm: Normal rate.  Pulmonary:     Effort: Pulmonary effort is normal.     Breath sounds: Normal breath sounds.  Abdominal:     General: Abdomen is flat.     Palpations: Abdomen is soft.     Tenderness: There is no abdominal tenderness.  Genitourinary:    Comments: Erythema, tenderness, swelling to R testicle Musculoskeletal:        General: No swelling. Normal range of motion.     Cervical back: Neck supple.  Skin:    General: Skin is warm and dry.  Neurological:     General: No focal deficit present.     Mental Status: He is alert.  Psychiatric:        Mood and Affect: Mood normal.     ED Results / Procedures / Treatments   Labs (all labs ordered are listed, but only abnormal results are displayed) Labs Reviewed  COMPREHENSIVE METABOLIC PANEL - Abnormal; Notable  for the following components:      Result Value   Sodium 129 (*)    Chloride 95 (*)    Glucose, Bld 241 (*)    Calcium 8.7 (*)    All other components within normal limits  CBC WITH DIFFERENTIAL/PLATELET - Abnormal; Notable for the following components:   WBC 19.5 (*)    RBC 4.15 (*)    Hemoglobin 12.8 (*)    HCT 38.3 (*)    Neutro Abs 16.8 (*)    Monocytes Absolute 1.1 (*)    Abs Immature Granulocytes 0.23 (*)    All other components within normal limits  URINALYSIS, ROUTINE W REFLEX MICROSCOPIC - Abnormal; Notable for the following components:   APPearance CLOUDY (*)    Glucose, UA 500 (*)    Hgb urine dipstick SMALL (*)    Ketones, ur TRACE (*)    Protein, ur TRACE (*)    Leukocytes,Ua MODERATE (*)    WBC, UA >50 (*)    Bacteria, UA RARE (*)    All other components within normal limits  RESP PANEL BY RT-PCR (FLU A&B, COVID) ARPGX2  CULTURE, BLOOD (ROUTINE X 2)  CULTURE, BLOOD (ROUTINE X 2)  URINE CULTURE  LACTIC ACID, PLASMA  LACTIC ACID, PLASMA  PROTIME-INR  APTT    EKG EKG Interpretation  Date/Time:  Thursday November 04 2021 11:43:53 EST Ventricular Rate:  98 PR Interval:  162 QRS Duration: 103 QT Interval:  348 QTC Calculation: 445 R Axis:   60 Text Interpretation: Sinus rhythm Probable left atrial enlargement Baseline wander in lead(s) V6 No significant change since last  tracing Confirmed by Calvert Cantor [phone]) on 11/04/2021 1:09:52 PM  Radiology US Scrotum  Result Date: 11/04/2021 CLINICAL DATA:  Right testicle pain and swelling EXAM: SCROTAL ULTRASOUND DOPPLER ULTRASOUND OF THE TESTICLES TECHNIQUE: Complete ultrasound examination of the testicles, epididymis, and other scrotal structures was performed. Color and spectral Doppler ultrasound were also utilized to evaluate blood flow to the testicles. COMPARISON:  None. FINDINGS: Right testicle Measurements: 3.9 x 2.8 x 3.3 cm. No mass or microlithiasis visualized. Left testicle Measurements: 2.9 x 1.8  x 1.8 cm. Somewhat heterogeneous echotexture of the left testicle, which is asymmetrically small. There is a focal, somewhat hyperechoic region near the testicular hilum measuring 0.6 x 0.5 x 0.4 cm. Right epididymis: Enlarged, heterogeneous right epididymal head. Incidental benign subcentimeter cyst or spermatocele. Left epididymis: Normal in size and appearance. Incidental benign subcentimeter cyst or spermatocele. Hydrocele:  Small debris containing right hydrocele. Varicocele:  None visualized. Pulsed Doppler interrogation of both testes demonstrates normal low resistance arterial and venous waveforms bilaterally. IMPRESSION: 1. Enlarged, heterogeneous right epididymal head with a small, debris containing right hydrocele, consistent with epididymitis. The right testicle is normal in size and appearance. 2. Somewhat heterogeneous echotexture of the left testicle, which is asymmetrically small compared to the right. There is a focal, somewhat hyperechoic region near the testicular hilum measuring 0.6 cm. Suspect that this overall appearance is related to sequelae of prior infection or inflammation, however a small testicular neoplasm is difficult to strictly exclude. Recommend follow-up ultrasound in 3 months to ensure stability. 3. Normal, symmetric arterial and venous Doppler flow is present to the bilateral testicles. Electronically Signed   By: Delanna Ahmadi M.D.   On: 11/04/2021 15:02   DG Chest Port 1 View  Result Date: 11/04/2021 CLINICAL DATA:  Possible sepsis. EXAM: PORTABLE CHEST 1 VIEW COMPARISON:  Chest x-ray dated April 18, 2018. FINDINGS: The heart size and mediastinal contours are within normal limits. Normal pulmonary vascularity. Mild bibasilar atelectasis. No focal consolidation, pleural effusion, or pneumothorax. No acute osseous abnormality. IMPRESSION: 1. Mild bibasilar atelectasis. Electronically Signed   By: Titus Dubin M.D.   On: 11/04/2021 12:18     Procedures Procedures  Medications Ordered in the ED Medications  lactated ringers bolus 1,000 mL (1,000 mLs Intravenous New Bag/Given 11/04/21 1334)  levofloxacin (LEVAQUIN) IVPB 500 mg (500 mg Intravenous New Bag/Given 11/04/21 1345)     MDM Rules/Calculators/A&P MDM Patient here with likely prostatitis now with orchitis/epididymitis. He had sepsis order set initiated in triage confirming a leukocytosis but normal BP and normal lactic acid. Given concern for spread to testicle and general weakness will give IVF and begin IV Levaquin. Check head CT due to recent head injury and persistent headache. US scrotum ordered.   ED Course  I have reviewed the triage vital signs and the nursing notes.  Pertinent labs & imaging results that were available during my care of the patient were reviewed by me and considered in my medical decision making (see chart for details).  Clinical Course as of 11/04/21 1515  Thu Nov 04, 2021  1403 CMP with mild hyperglycemia and hyponatremia, otherwise unremarkable. Covid/Flu are neg. Coags normal.  [CS]  1512 [place] results reviewed, consistent with epididymitis. UA is consistent with infection.  [CS]  50 Spoke with Dr. Dwyane Dee, Hospitalist, who will evaluate for admission.  [CS]    Clinical Course User Index [CS] Truddie Hidden, MD    Final Clinical Impression(s) / ED Diagnoses Final diagnoses:  Epididymitis  Acute prostatitis  Rx / DC Orders ED Discharge Orders     None        Truddie Hidden, MD 11/04/21 1515

## 2021-11-04 NOTE — H&P (Addendum)
History and Physical    Robert [name] YDX:[id number] DOB: May 08, 1946 DOA: 11/04/2021  PCP: Vivi Barrack, MD   Patient coming from: Home  I have personally briefly reviewed patient's old medical records in [place]  Chief Complaint: Increased urinary frequency, burning and headache.  HPI: Robert [name] is a 75 y.o. male with PMH significant for CAD, GERD, hyperlipidemia, hypertension, migraine, OSA on CPAP presented in the ED with c/o: increased urinary frequency, burning urination, dizziness and headache.  Patient reports having increased urinary frequency associated with low-grade fever for few days,  He went to see his primary care physician yesterday and he was diagnosed as prostatitis and was started on ciprofloxacin.  Patient reports taking ciprofloxacin, he went to have lab work in the PCPs office today morning and found to have leukocytosis.  Patient was advised to go to the ED.  Patient also reports he has fallen 2 days ago and hit his head but denies any loss of consciousness.  He has been feeling dizzy, denies any chest pain, shortness of breath, nausea ,vomiting, diarrhea.  Denies any recent travel or sick contacts.  ED Course: He was hemodynamically stable except hypertension. Temp 99.1, HR 76, RR 20, BP 130/51, SPO2 95% on room air Labs include sodium 129, potassium 3.8, chloride 95, bicarb 24, glucose 241, BUN 8, creatinine 0.71, calcium 8.7, anion gap 10, alkaline phosphatase 76, albumin 3.6, AST 20, ALT 27, total protein 7.2, total bilirubin 0.8, lactic acid 1.6, WBC 19.5, hemoglobin 12.8, hematocrit 38.3, MCV 92.3, platelet 169, influenza negative, COVID-negative, UA: LE moderate, nitrites negative, glucose 500, bacteria many. CT head: No acute intracranial abnormality noted, chest x-ray mild bibasilar atelectasis. Ultrasound scrotum : Findings consistent with epididymitis  Review of Systems:  Review of Systems  Constitutional:  Positive for chills, fever and  malaise/fatigue.  HENT: Negative.    Eyes: Negative.   Respiratory: Negative.    Cardiovascular: Negative.   Gastrointestinal: Negative.   Genitourinary:  Positive for dysuria, frequency and urgency.  Musculoskeletal:  Positive for falls and myalgias.  Skin: Negative.   Neurological:  Positive for dizziness, weakness and headaches.  Endo/Heme/Allergies: Negative.   Psychiatric/Behavioral: Negative.     Past Medical History:  Diagnosis Date   Arthritis    CAD (coronary artery disease) 10/10/2011   NSTEMI with DES to the proximal RCA following flow wire evaluation. EF is normal.    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   CORONARY ANGIOPLASTY WITH STENT PLACEMENT  10/13/2011   DES to the proximal RCA. Dr Martinique   FRACTIONAL FLOW RESERVE WIRE Right 10/13/2011   Procedure: [place];  Surgeon: Peter M Martinique, MD;  Location: [facility] CATH LAB;  Service: Cardiovascular;  Laterality: Right;   HERNIA REPAIR     INGUINAL HERNIA REPAIR  10/23/2012   Procedure: HERNIA REPAIR INGUINAL ADULT;  Surgeon: Odis Hollingshead, MD;  Location: [facility];  Service: General;  Laterality: Left;  Left lower quadrant; left inguinal hernia repair with mesh   INSERTION OF MESH  10/23/2012   Procedure: INSERTION OF MESH;  Surgeon: Odis Hollingshead, MD;  Location: [place];  Service: General;  Laterality: Left;  Left lower quadrant   LEFT HEART  CATHETERIZATION WITH CORONARY ANGIOGRAM N/A 10/13/2011   Procedure: LEFT HEART CATHETERIZATION WITH CORONARY ANGIOGRAM;  Surgeon: Peter M Martinique, MD;  Location: [facility] CATH LAB;  Service: Cardiovascular;  Laterality: N/A;   PERCUTANEOUS CORONARY STENT INTERVENTION (PCI-S) Right 10/13/2011   Procedure: PERCUTANEOUS CORONARY STENT INTERVENTION (PCI-S);  Surgeon: Peter M Martinique, MD;  Location: [facility] CATH LAB;  Service: Cardiovascular;  Laterality: Right;   SHOULDER ARTHROSCOPY W/ ROTATOR CUFF REPAIR Left 1990's   TOTAL HIP ARTHROPLASTY Left 05/14/2021   Procedure: LEFT TOTAL HIP ARTHROPLASTY ANTERIOR APPROACH;  Surgeon: Mcarthur Rossetti, MD;  Location: [facility];  Service: Orthopedics;  Laterality: Left;   TRIGGER FINGER RELEASE Left    3rd and 4th digits     reports that he quit smoking about 39 years ago. His smoking use included cigarettes. He has a 40.00 pack-year smoking history. He has never used smokeless tobacco. He reports that he does not currently use alcohol. He reports that he does not use drugs.  Allergies  Allergen Reactions   Sumatriptan Shortness Of Breath    REACTION: sob, headache scalp sensitivity   Penicillins Itching   Oxycodone Itching    Family History  Problem Relation Age of Onset   Cancer Mother        lung   Heart disease Father        heart attack    Cancer Father        colon   Migraines Neg Hx    Headache Neg Hx    Family history reviewed and not pertinent .  Prior to Admission medications   Medication Sig Start Date End Date Taking? Authorizing Provider  Ascorbic Acid (VITAMIN C) 1000 MG tablet Take 1,000 mg by mouth daily.    [provider]  aspirin 81 MG chewable tablet Chew 1 tablet (81 mg total) by mouth 2 (two) times daily. 05/15/21   Pete Pelt, PA-C  azelastine (ASTELIN) 0.1 % nasal spray Place 1 spray into both nostrils 2 (two) times daily. Use in each nostril as directed    [provider]  B Complex-C (SUPER B COMPLEX PO)  Take 1 tablet by mouth daily.    [provider]  brimonidine (ALPHAGAN) 0.2 % ophthalmic solution Place 1 drop into the right eye in the morning and at bedtime.    [provider]  ciprofloxacin (CIPRO) 500 MG tablet Take 1 tablet (500 mg total) by mouth 2 (two) times daily for 28 days. 11/03/21 12/01/21  Worley, Aldona Bar, PA  EMGALITY 120 MG/ML SOAJ INJECT 120 MG INTO THE SKIN EVERY 30 (THIRTY) DAYS. 09/23/21   [name], MD  fluticasone (FLONASE) 50 MCG/ACT nasal spray Place 1 spray into both nostrils daily as needed for allergies.    [provider]  latanoprost (XALATAN) 0.005 % ophthalmic solution Place 1 drop into the right eye at bedtime.    [provider]  Multiple Vitamins-Minerals (MULTIVITAMINS THER. W/MINERALS) TABS Take 1 tablet by mouth daily.    [provider]  Multiple Vitamins-Minerals (ZINC PO) Take 50 mg by mouth daily.  [provider]  nitroGLYCERIN (NITROSTAT) 0.4 MG SL tablet Place 1 tablet (0.4 mg total) under the tongue every 5 (five) minutes as needed for chest pain. Do not exceed more than 3 doses per day. 05/20/21 08/18/21  Martinique, Peter M, MD  NURTEC 75 MG TBDP TAKE 75 MG BY MOUTH DAILY AS NEEDED. FOR MIGRAINES. TAKE AS CLOSE TO ONSET OF MIGRAINE AS POSSIBLE. ONE DAILY MAXIMUM. 09/20/21   [name], MD  pantoprazole (PROTONIX) 40 MG tablet Take 40 mg by mouth daily. 01/28/21   [provider]  Propylene Glycol (SYSTANE BALANCE) 0.6 % SOLN Place 1 drop into both eyes 3 (three) times daily as needed (dry eyes).    [provider]  rosuvastatin (CRESTOR) 40 MG tablet Take 40 mg by mouth daily. 01/28/21   [provider]  tamsulosin (FLOMAX) 0.4 MG CAPS capsule Take 1 capsule (0.4 mg total) by mouth daily. 11/03/21   Inda Coke, PA  traMADol (ULTRAM) 50 MG tablet Take 50 mg by mouth daily. 08/17/21   [provider]  vitamin E 180 MG (400 UNITS) capsule Take 400 Units by  mouth daily.    [provider]    Physical Exam: Vitals:   11/04/21 1515 11/04/21 1518 11/04/21 1530 11/04/21 1550  BP:  (!) 130/51  (!) 153/59  Pulse: 74 77 76 (!) 46  Resp:   20 18  Temp:      TempSrc:      SpO2: 97% 98% 95% 94%  Weight:      Height:        Constitutional: He is comfortable, not in any acute distress. Vitals:   11/04/21 1515 11/04/21 1518 11/04/21 1530 11/04/21 1550  BP:  (!) 130/51  (!) 153/59  Pulse: 74 77 76 (!) 46  Resp:   20 18  Temp:      TempSrc:      SpO2: 97% 98% 95% 94%  Weight:      Height:       Eyes: PERRL, lids and conjunctivae normal ENMT: Mucous membranes are moist. Posterior pharynx without any exudate.  Normal dentition.  Neck: normal, supple, no masses, no thyromegaly Respiratory: Clear to auscultation bilaterally, no wheezing, no crackles.  No accessory muscle use.  Cardiovascular: S1-S2 heard, regular rate and rhythm, no murmur. Abdomen: Abdomen is soft, distended, nontender, BS +, no hepatosplenomegaly. Bowel sounds positive.  GU: Deferred by patient Musculoskeletal: No edema, no cyanosis, no clubbing.  Good ROM, no contractures. Normal muscle tone.  Skin: no rashes, lesions, ulcers. No induration Neurologic: CN 2-12 grossly intact. Sensation intact, DTR normal. Strength 5/5 in all 4.  Psychiatric: Normal judgment and insight. Alert and oriented x 3. Normal mood.    Labs on Admission: I have personally reviewed following labs and imaging studies  CBC: Recent Labs  Lab 11/04/21 0846 11/04/21 1206 11/04/21 1322  WBC 18.2 Repeated and verified X2.* 19.5* 18.8*  NEUTROABS 15.4* 16.8*  --   HGB 12.5* 12.8* 12.1*  HCT 37.6* 38.3* 36.7*  MCV 91.0 92.3 94.1  PLT 177.0 169 122   Basic Metabolic Panel: Recent Labs  Lab 11/04/21 0846 11/04/21 1206 11/04/21 1322  NA 131* 129*  --   K 4.0 3.8  --   CL 97 95*  --   CO2 28 24  --   GLUCOSE 171* 241*  --   BUN 7 8  --   CREATININE 0.75 0.71 0.76  CALCIUM 9.0 8.7*   --  GFR: Estimated Creatinine Clearance: 95.5 mL/min (by C-G formula based on SCr of 0.76 mg/dL). Liver Function Tests: Recent Labs  Lab 11/04/21 0846 11/04/21 1206  AST 15 20  ALT 23 27  ALKPHOS 76 76  BILITOT 0.8 0.8  PROT 6.8 7.2  ALBUMIN 3.6 3.6   No results for input(s): LIPASE, AMYLASE in the last 168 hours. No results for input(s): AMMONIA in the last 168 hours. Coagulation Profile: Recent Labs  Lab 11/04/21 1206  INR 1.1   Cardiac Enzymes: No results for input(s): CKTOTAL, CKMB, CKMBINDEX, TROPONINI in the last 168 hours. BNP (last 3 results) No results for input(s): PROBNP in the last 8760 hours. HbA1C: No results for input(s): HGBA1C in the last 72 hours. CBG: No results for input(s): GLUCAP in the last 168 hours. Lipid Profile: No results for input(s): CHOL, HDL, LDLCALC, TRIG, CHOLHDL, LDLDIRECT in the last 72 hours. Thyroid Function Tests: No results for input(s): TSH, T4TOTAL, FREET4, T3FREE, THYROIDAB in the last 72 hours. Anemia Panel: No results for input(s): VITAMINB12, FOLATE, FERRITIN, TIBC, IRON, RETICCTPCT in the last 72 hours. Urine analysis:    Component Value Date/Time   COLORURINE YELLOW 11/04/2021 1310   APPEARANCEUR CLOUDY (A) 11/04/2021 1310   LABSPEC >=1.030 11/04/2021 1310   PHURINE 6.0 11/04/2021 1310   GLUCOSEU 500 (A) 11/04/2021 1310   HGBUR SMALL (A) 11/04/2021 1310   BILIRUBINUR NEGATIVE 11/04/2021 1310   BILIRUBINUR 1 11/03/2021 1617   KETONESUR TRACE (A) 11/04/2021 1310   PROTEINUR TRACE (A) 11/04/2021 1310   UROBILINOGEN negative (A) 11/03/2021 1617   NITRITE NEGATIVE 11/04/2021 1310   LEUKOCYTESUR MODERATE (A) 11/04/2021 1310    Radiological Exams on Admission: CT Head Wo Contrast  Result Date: 11/04/2021 CLINICAL DATA:  Head trauma, minor patient fell and hit back of the head on concrete couple of days ago. EXAM: CT HEAD WITHOUT CONTRAST TECHNIQUE: Contiguous axial images were obtained from the base of the skull  through the vertex without intravenous contrast. COMPARISON:  MRI examination dated December 16, 2018 FINDINGS: Brain: No evidence of acute infarction, hemorrhage, hydrocephalus, extra-axial collection or mass lesion/mass effect. Mild-to-moderate chronic microvascular ischemic changes of the white matter. Vascular: No hyperdense vessel or unexpected calcification. Skull: Normal. Negative for fracture or focal lesion. Sinuses/Orbits: No acute finding. Other: None. IMPRESSION: No acute intracranial abnormality. Electronically Signed   By: Keane Police D.O.   On: 11/04/2021 15:34   US Scrotum  Result Date: 11/04/2021 CLINICAL DATA:  Right testicle pain and swelling EXAM: SCROTAL ULTRASOUND DOPPLER ULTRASOUND OF THE TESTICLES TECHNIQUE: Complete ultrasound examination of the testicles, epididymis, and other scrotal structures was performed. Color and spectral Doppler ultrasound were also utilized to evaluate blood flow to the testicles. COMPARISON:  None. FINDINGS: Right testicle Measurements: 3.9 x 2.8 x 3.3 cm. No mass or microlithiasis visualized. Left testicle Measurements: 2.9 x 1.8 x 1.8 cm. Somewhat heterogeneous echotexture of the left testicle, which is asymmetrically small. There is a focal, somewhat hyperechoic region near the testicular hilum measuring 0.6 x 0.5 x 0.4 cm. Right epididymis: Enlarged, heterogeneous right epididymal head. Incidental benign subcentimeter cyst or spermatocele. Left epididymis: Normal in size and appearance. Incidental benign subcentimeter cyst or spermatocele. Hydrocele:  Small debris containing right hydrocele. Varicocele:  None visualized. Pulsed Doppler interrogation of both testes demonstrates normal low resistance arterial and venous waveforms bilaterally. IMPRESSION: 1. Enlarged, heterogeneous right epididymal head with a small, debris containing right hydrocele, consistent with epididymitis. The right testicle is normal in size and appearance. 2.  Somewhat heterogeneous  echotexture of the left testicle, which is asymmetrically small compared to the right. There is a focal, somewhat hyperechoic region near the testicular hilum measuring 0.6 cm. Suspect that this overall appearance is related to sequelae of prior infection or inflammation, however a small testicular neoplasm is difficult to strictly exclude. Recommend follow-up ultrasound in 3 months to ensure stability. 3. Normal, symmetric arterial and venous Doppler flow is present to the bilateral testicles. Electronically Signed   By: Delanna Ahmadi M.D.   On: 11/04/2021 15:02   DG Chest Port 1 View  Result Date: 11/04/2021 CLINICAL DATA:  Possible sepsis. EXAM: PORTABLE CHEST 1 VIEW COMPARISON:  Chest x-ray dated April 18, 2018. FINDINGS: The heart size and mediastinal contours are within normal limits. Normal pulmonary vascularity. Mild bibasilar atelectasis. No focal consolidation, pleural effusion, or pneumothorax. No acute osseous abnormality. IMPRESSION: 1. Mild bibasilar atelectasis. Electronically Signed   By: Titus Dubin M.D.   On: 11/04/2021 12:18    EKG: Independently reviewed.  Sinus rhythm probable left atrial enlargement.  Assessment/Plan Principal Problem:   Sepsis ([place]) Active Problems:   Hyperlipidemia associated with type 2 diabetes mellitus ([name])   GERD   Prostatitis, recurrent   Sleep apnea   Type 2 diabetes mellitus with hyperglycemia ([name])   Chronic migraine without aura, with intractable migraine, so stated, with status migrainosus  Sepsis secondary to UTI /Epididymitis.: Patient presented with fever, leukocytosis, UA+, lactic acid 1.6. Patient was prescribed ciprofloxacin by PCP yesterday for prostatitis. Patient remains febrile at home with leukocytosis found on labs. Continue Levaquin 500 mg IV daily Follow blood cultures and urine cultures. Continue IV hydration.  CAD: Continue aspirin and rosuvastatin  Hyperlipidemia: Continue rosuvastatin.  Type 2 diabetes: Start  regular insulin sliding scale. Obtain hemoglobin A1c  BPH: Continue Flomax  Migraine: Tramadol, resume home meds.  Hyponatremia This could be secondary to dehydration.   Continue IV hydration.  Recheck a.m. labs  DVT prophylaxis: Lovenox Code Status: Full code. Family Communication:  Disposition Plan:   Status is: Inpatient  Remains inpatient appropriate because: Admitted for sepsis secondary to epididymitis requiring IV antibiotics.   Consults called:  None Admission status: Inpatient   Shawna Clamp MD [facility]   If 7PM-7AM, please contact night-coverage [email or web address] 11/04/2021, 4:05 PM

## 2021-11-04 NOTE — Sepsis Progress Note (Signed)
Notified bedside nurse of need to administer antibiotics.  

## 2021-11-04 NOTE — Sepsis Progress Note (Signed)
Elink tracking the code Sepsis.  

## 2021-11-04 NOTE — ED Provider Notes (Signed)
Emergency Medicine Provider Triage Evaluation Note  Robert [name] , a 75 y.o. male  was evaluated in triage.  Patient presents with daughter who states that yesterday they went to the primary care for prostatitis.  States that there he had foul-smelling urine.  Patient states that he has had dysuria for about a week now.  States that at primary care they started him on ciprofloxacin and Flomax.  States that symptoms have worsened to include headache, "feeling like he has the flu", dizziness, shortness of breath.  Endorses decreased p.o. intake.  Low-grade fever at home.  Daughter at bedside states that he has been intermittently disoriented at home which is not normal.  States that primary care this morning called and stated that he had leukocytosis to 18,000 and he should present to the emergency department.  Has had 3 total doses of Cipro.  He denies abdominal pain, chest pain.  Review of Systems  Positive: See above Negative:   Physical Exam  BP (!) 121/56 (BP Location: Right Arm)    Pulse 98    Temp 99.1 F (37.3 C) (Oral)    Resp 18    Ht 6\' 1"  (1.854 m)    Wt 98.5 kg    SpO2 96%    BMI 28.66 kg/m  Gen:   Awake, no distress   Resp:  Normal effort, lungs clear to auscultation bilaterally MSK:   Moves extremities without difficulty  Other:  Abdomen is rounded, soft, nontender to palpation.  Medical Decision Making  Medically screening exam initiated at 11:57 AM.  Appropriate orders placed.  Robert [name] was informed that the remainder of the evaluation will be completed by another provider, this initial triage assessment does not replace that evaluation, and the importance of remaining in the ED until their evaluation is complete.     Mickie Hillier, PA-C 11/04/21 [place], MD 11/05/21 [phone]

## 2021-11-04 NOTE — Telephone Encounter (Signed)
Spoke to pt told him per Dr. Dallie Piles blood count is significantly elevated at over 18,000.  Yesterday there was concern for prostatitis and patient was started on antibiotic-in light of significantly elevated white blood count could certainly proceed to the emergency room. Pt verbalized understanding. Asked pt if he was feeling any better today. Pt said no feel terrible. Told pt I really need you to go to the ED. Asked pt if his daughter could take him? Pt said yes and then his daughter Juventino Slovak got on the phone. Told her his white count is elevated over 18,000 meaning infection needs to go to ED to be evaluated. Adrienne verbalized understanding and said she would take him to ED at [facility]. Told her okay. Called back right away told her Dr. Yong Channel said to got [facility] due to he might be septic and need to be admitted then they won't have to transfer him. Adrienne verbalized understanding.

## 2021-11-04 NOTE — Telephone Encounter (Signed)
White blood count is significantly elevated at over 18,000.  Yesterday there was concern for prostatitis and patient was started on antibiotic-in light of significantly elevated white blood count could certainly proceed to the emergency room and that would be my default recommendation.  If he is having significant improvement in symptoms would have the option of monitoring at home unless symptoms worsen or fail to continue to improve

## 2021-11-04 NOTE — Telephone Encounter (Signed)
CRITICAL VALUE STICKER  CRITICAL VALUE: WBC 18.2  RECEIVER (on-site recipient of call): Anselmo Pickler, LPN  DATE & TIME NOTIFIED: 11/04/2021 at 10:18  MESSENGER (representative from lab): Santiago Glad at [place]  MD NOTIFIED: Dr. Yong Channel  TIME OF NOTIFICATION: 10:19  RESPONSE:

## 2021-11-05 LAB — COMPREHENSIVE METABOLIC PANEL
ALT: 25 U/L (ref 0–44)
AST: 20 U/L (ref 15–41)
Albumin: 3 g/dL — ABNORMAL LOW (ref 3.5–5.0)
Alkaline Phosphatase: 66 U/L (ref 38–126)
Anion gap: 7 (ref 5–15)
BUN: 9 mg/dL (ref 8–23)
CO2: 24 mmol/L (ref 22–32)
Calcium: 8.5 mg/dL — ABNORMAL LOW (ref 8.9–10.3)
Chloride: 101 mmol/L (ref 98–111)
Creatinine, Ser: 0.72 mg/dL (ref 0.61–1.24)
GFR, Estimated: >60 mL/min (ref >60)
Glucose, Bld: 166 mg/dL — ABNORMAL HIGH (ref 70–99)
Potassium: 4.3 mmol/L (ref 3.5–5.1)
Sodium: 132 mmol/L — ABNORMAL LOW (ref 135–145)
Total Bilirubin: 0.6 mg/dL (ref 0.3–1.2)
Total Protein: 6.4 g/dL — ABNORMAL LOW (ref 6.5–8.1)

## 2021-11-05 LAB — HEMOGLOBIN A1C
Hgb A1c MFr Bld: 8.2 % — ABNORMAL HIGH (ref 4.8–5.6)
Mean Plasma Glucose: 188.64 mg/dL

## 2021-11-05 LAB — URINE CULTURE: Culture: NO GROWTH

## 2021-11-05 LAB — CBC
HCT: 35.4 % — ABNORMAL LOW (ref 39.0–52.0)
Hemoglobin: 11.8 g/dL — ABNORMAL LOW (ref 13.0–17.0)
MCH: 30.5 pg (ref 26.0–34.0)
MCHC: 33.3 g/dL (ref 30.0–36.0)
MCV: 91.5 fL (ref 80.0–100.0)
Platelets: 160 10*3/uL (ref 150–400)
RBC: 3.87 MIL/uL — ABNORMAL LOW (ref 4.22–5.81)
RDW: 14.5 % (ref 11.5–15.5)
WBC: 13.5 10*3/uL — ABNORMAL HIGH (ref 4.0–10.5)
nRBC: 0 % (ref 0.0–0.2)

## 2021-11-05 LAB — PROCALCITONIN: Procalcitonin: 0.16 ng/mL

## 2021-11-05 LAB — PROTIME-INR
INR: 1.1 (ref 0.8–1.2)
Prothrombin Time: 13.9 seconds (ref 11.4–15.2)

## 2021-11-05 LAB — PHOSPHORUS: Phosphorus: 2.7 mg/dL (ref 2.5–4.6)

## 2021-11-05 LAB — CORTISOL-AM, BLOOD: Cortisol - AM: 12.8 ug/dL (ref 6.7–22.6)

## 2021-11-05 LAB — GLUCOSE, CAPILLARY
Glucose-Capillary: 171 mg/dL — ABNORMAL HIGH (ref 70–99)
Glucose-Capillary: 205 mg/dL — ABNORMAL HIGH (ref 70–99)
Glucose-Capillary: 195 mg/dL — ABNORMAL HIGH (ref 70–99)

## 2021-11-05 LAB — MAGNESIUM: Magnesium: 1.7 mg/dL (ref 1.7–2.4)

## 2021-11-05 NOTE — Progress Notes (Signed)
PROGRESS NOTE    Robert [name]  JOA:[id number] DOB: Jan 10, 1946 DOA: 11/04/2021 PCP: Vivi Barrack, MD   Brief Narrative:  This 75 years old male with PMH significant for CAD, GERD, hyperlipidemia, hypertension, migraine, OSA on CPAP presented in the ED with complaints of increased urinary frequency burning urination dizziness and headache.  Patient went to see his primary care physician who has diagnosed him with prostatitis and was started on ciprofloxacin.  Patient has received call from PCPs office that his white cell count is elevated despite being on antibiotics he was advised to go to the ED.  Patient is admitted for possible sepsis secondary to epidydimitis.  Patient is started on IV antibiotic he reports feeling better.  Assessment & Plan:   Principal Problem:   Sepsis ([place]) Active Problems:   Hyperlipidemia associated with type 2 diabetes mellitus ([name])   GERD   Prostatitis, recurrent   Sleep apnea   Type 2 diabetes mellitus with hyperglycemia ([name])   Chronic migraine without aura, with intractable migraine, so stated, with status migrainosus  Sepsis secondary to UTI /Epididymitis: Patient presented with fever, leukocytosis, UA+, lactic acid 1.6. Patient was prescribed ciprofloxacin by PCP yesterday for prostatitis. Patient remains febrile at home with leukocytosis found on labs. Continue Levaquin 500 mg IV daily Blood cultures no growth so far, urine cultures negative Continue IV hydration.  Sepsis physiology improving   CAD: Continue aspirin and rosuvastatin.   Hyperlipidemia: Continue rosuvastatin.   Type 2 diabetes: Continue regular insulin sliding scale. Obtain hemoglobin A1c   BPH: Continue Flomax   Migraine: Tramadol, resume home meds.   Hyponatremia > Improving This could be secondary to dehydration.   Continue IV hydration.  Recheck a.m. labs    DVT prophylaxis: Lovenox Code Status: Full code Family Communication: Spoke with  daughter Disposition Plan:   Status is: Inpatient  Remains inpatient appropriate because: Admitted for sepsis secondary to epididymitisrequiring IV antibiotics   Anticipated discharge home in 1 to 2 days.  Consultants:   None  Procedures: None  Antimicrobials:   Anti-infectives (From admission, onward)    Start     Dose/Rate Route Frequency Ordered Stop   11/05/21 1400  levofloxacin (LEVAQUIN) IVPB 500 mg        500 mg 100 mL/hr over 60 Minutes Intravenous Every 24 hours 11/04/21 1521     11/04/21 1345  levofloxacin (LEVAQUIN) IVPB 500 mg        500 mg 100 mL/hr over 60 Minutes Intravenous  Once 11/04/21 1333 11/04/21 1533       Subjective: Patient was seen and examined at bedside.  Overnight events noted.   Patient reports feeling much improved.  He denies any fever, stated pain is better controlled  Objective: Vitals:   11/05/21 0200 11/05/21 0400 11/05/21 0600 11/05/21 1016  BP:    (!) 157/86  Pulse:    92  Resp:    16  Temp: 99.1 F (37.3 C) 97.8 F (36.6 C) 98.1 F (36.7 C) 98.1 F (36.7 C)  TempSrc: Oral Oral  Oral  SpO2:    93%  Weight:      Height:        Intake/Output Summary (Last 24 hours) at 11/05/2021 1225 Last data filed at 11/05/2021 0914 Gross per 24 hour  Intake 1876.51 ml  Output 1425 ml  Net 451.51 ml   Filed Weights   11/04/21 1135 11/04/21 1751  Weight: 98.5 kg 95.9 kg    Examination:  General exam: Appears calm  and comfortable, not in any distress. Respiratory system: Clear to auscultation. Respiratory effort normal. Cardiovascular system: S1-S2 heard, regular rate and rhythm, no murmur. Gastrointestinal system: Abdomen is soft, nontender, nondistended, BS+ Central nervous system: Alert and oriented x 3. No focal neurological deficits. Extremities: No edema, no cyanosis, no clubbing. Skin: No rashes, lesions or ulcers Psychiatry: Judgement and insight appear normal. Mood & affect appropriate.     Data Reviewed: I have  personally reviewed following labs and imaging studies  CBC: Recent Labs  Lab 11/04/21 0846 11/04/21 1206 11/04/21 1322 11/05/21 0514  WBC 18.2 Repeated and verified X2.* 19.5* 18.8* 13.5*  NEUTROABS 15.4* 16.8*  --   --   HGB 12.5* 12.8* 12.1* 11.8*  HCT 37.6* 38.3* 36.7* 35.4*  MCV 91.0 92.3 94.1 91.5  PLT 177.0 169 163 027   Basic Metabolic Panel: Recent Labs  Lab 11/04/21 0846 11/04/21 1206 11/04/21 1322 11/05/21 0514  NA 131* 129*  --  132*  K 4.0 3.8  --  4.3  CL 97 95*  --  101  CO2 28 24  --  24  GLUCOSE 171* 241*  --  166*  BUN 7 8  --  9  CREATININE 0.75 0.71 0.76 0.72  CALCIUM 9.0 8.7*  --  8.5*  MG  --   --   --  1.7  PHOS  --   --   --  2.7   GFR: Estimated Creatinine Clearance: 94.4 mL/min (by C-G formula based on SCr of 0.72 mg/dL). Liver Function Tests: Recent Labs  Lab 11/04/21 0846 11/04/21 1206 11/05/21 0514  AST 15 20 20   ALT 23 27 25   ALKPHOS 76 76 66  BILITOT 0.8 0.8 0.6  PROT 6.8 7.2 6.4*  ALBUMIN 3.6 3.6 3.0*   No results for input(s): LIPASE, AMYLASE in the last 168 hours. No results for input(s): AMMONIA in the last 168 hours. Coagulation Profile: Recent Labs  Lab 11/04/21 1206 11/05/21 0514  INR 1.1 1.1   Cardiac Enzymes: No results for input(s): CKTOTAL, CKMB, CKMBINDEX, TROPONINI in the last 168 hours. BNP (last 3 results) No results for input(s): PROBNP in the last 8760 hours. HbA1C: Recent Labs    11/04/21 1322  HGBA1C 8.2*   CBG: Recent Labs  Lab 11/04/21 1645 11/04/21 2115 11/05/21 0742 11/05/21 1130  GLUCAP 155* 197* 171* 205*   Lipid Profile: No results for input(s): CHOL, HDL, LDLCALC, TRIG, CHOLHDL, LDLDIRECT in the last 72 hours. Thyroid Function Tests: No results for input(s): TSH, T4TOTAL, FREET4, T3FREE, THYROIDAB in the last 72 hours. Anemia Panel: No results for input(s): VITAMINB12, FOLATE, FERRITIN, TIBC, IRON, RETICCTPCT in the last 72 hours. Sepsis Labs: Recent Labs  Lab 11/04/21 1204  11/04/21 1321 11/05/21 0514  PROCALCITON  --   --  0.16  LATICACIDVEN 1.6 1.6  --     Recent Results (from the past 240 hour(s))  Blood Culture (routine x 2)     Status: None (Preliminary result)   Collection Time: 11/04/21 12:07 PM   Specimen: Left Antecubital; Blood  Result Value Ref Range Status   Specimen Description   Final    LEFT ANTECUBITAL BLOOD Performed at [facility] [address], [place], [place] [address]    Special Requests   Final    Blood Culture results may not be optimal due to an excessive volume of blood received in culture bottles BOTTLES DRAWN AEROBIC AND ANAEROBIC Performed at [facility], [address] [place],  [place] [address]    Culture   Final    NO GROWTH < 24 HOURS Performed at [facility], [place] [address], [place], [place] [address]    Report Status PENDING  Incomplete  Resp Panel by RT-PCR (Flu A&B, Covid) Nasopharyngeal Swab     Status: None   Collection Time: 11/04/21 12:23 PM   Specimen: Nasopharyngeal Swab; Nasopharyngeal(NP) swabs in vial transport medium  Result Value Ref Range Status   SARS Coronavirus 2 by RT PCR NEGATIVE NEGATIVE Final    Comment: (NOTE) SARS-CoV-2 target nucleic acids are NOT DETECTED.  The SARS-CoV-2 RNA is generally detectable in upper respiratory specimens during the acute phase of infection. The lowest concentration of SARS-CoV-2 viral copies this assay can detect is 138 copies/mL. A negative result does not preclude SARS-Cov-2 infection and should not be used as the sole basis for treatment or other patient management decisions. A negative result may occur with  improper specimen collection/handling, submission of specimen other than nasopharyngeal swab, presence of viral mutation(s) within the areas targeted by this assay, and inadequate number of viral copies(<138 copies/mL). A negative result must be combined with clinical observations, patient history, and  epidemiological information. The expected result is Negative.  Fact Sheet for Patients:  EntrepreneurPulse.com.au  Fact Sheet for Healthcare Providers:  IncredibleEmployment.be  This test is no t yet approved or cleared by the [place] FDA and  has been authorized for detection and/or diagnosis of SARS-CoV-2 by FDA under an Emergency Use Authorization (EUA). This EUA will remain  in effect (meaning this test can be used) for the duration of the COVID-19 declaration under Section 564(b)(1) of the Act, 21 U.S.C.section 360bbb-3(b)(1), unless the authorization is terminated  or revoked sooner.       Influenza A by PCR NEGATIVE NEGATIVE Final   Influenza B by PCR NEGATIVE NEGATIVE Final    Comment: (NOTE) The Xpert Xpress SARS-CoV-2/FLU/RSV plus assay is intended as an aid in the diagnosis of influenza from Nasopharyngeal swab specimens and should not be used as a sole basis for treatment. Nasal washings and aspirates are unacceptable for Xpert Xpress SARS-CoV-2/FLU/RSV testing.  Fact Sheet for Patients: EntrepreneurPulse.com.au  Fact Sheet for Healthcare Providers: IncredibleEmployment.be  This test is not yet approved or cleared by the [place] FDA and has been authorized for detection and/or diagnosis of SARS-CoV-2 by FDA under an Emergency Use Authorization (EUA). This EUA will remain in effect (meaning this test can be used) for the duration of the COVID-19 declaration under Section 564(b)(1) of the Act, 21 U.S.C. section 360bbb-3(b)(1), unless the authorization is terminated or revoked.  Performed at [facility], [place] [address], [place], [place] [address]   Blood Culture (routine x 2)     Status: None (Preliminary result)   Collection Time: 11/04/21  1:21 PM   Specimen: BLOOD RIGHT HAND  Result Value Ref Range Status   Specimen Description   Final    BLOOD RIGHT  HAND Performed at [facility] [address], [place], [place] [address]    Special Requests   Final    BOTTLES DRAWN AEROBIC AND ANAEROBIC Blood Culture results may not be optimal due to an excessive volume of blood received in culture bottles Performed at [facility] [address] [place], [place] [address]    Culture   Final    NO GROWTH < 24 HOURS Performed at [facility] [address] [place], [place] [address]  Report Status PENDING  Incomplete    Radiology Studies: CT Head Wo Contrast  Result Date: 11/04/2021 CLINICAL DATA:  Head trauma, minor patient fell and hit back of the head on concrete couple of days ago. EXAM: CT HEAD WITHOUT CONTRAST TECHNIQUE: Contiguous axial images were obtained from the base of the skull through the vertex without intravenous contrast. COMPARISON:  MRI examination dated December 16, 2018 FINDINGS: Brain: No evidence of acute infarction, hemorrhage, hydrocephalus, extra-axial collection or mass lesion/mass effect. Mild-to-moderate chronic microvascular ischemic changes of the white matter. Vascular: No hyperdense vessel or unexpected calcification. Skull: Normal. Negative for fracture or focal lesion. Sinuses/Orbits: No acute finding. Other: None. IMPRESSION: No acute intracranial abnormality. Electronically Signed   By: Keane Police D.O.   On: 11/04/2021 15:34   US Scrotum  Result Date: 11/04/2021 CLINICAL DATA:  Right testicle pain and swelling EXAM: SCROTAL ULTRASOUND DOPPLER ULTRASOUND OF THE TESTICLES TECHNIQUE: Complete ultrasound examination of the testicles, epididymis, and other scrotal structures was performed. Color and spectral Doppler ultrasound were also utilized to evaluate blood flow to the testicles. COMPARISON:  None. FINDINGS: Right testicle Measurements: 3.9 x 2.8 x 3.3 cm. No mass or microlithiasis visualized. Left testicle Measurements: 2.9 x 1.8 x 1.8 cm. Somewhat heterogeneous  echotexture of the left testicle, which is asymmetrically small. There is a focal, somewhat hyperechoic region near the testicular hilum measuring 0.6 x 0.5 x 0.4 cm. Right epididymis: Enlarged, heterogeneous right epididymal head. Incidental benign subcentimeter cyst or spermatocele. Left epididymis: Normal in size and appearance. Incidental benign subcentimeter cyst or spermatocele. Hydrocele:  Small debris containing right hydrocele. Varicocele:  None visualized. Pulsed Doppler interrogation of both testes demonstrates normal low resistance arterial and venous waveforms bilaterally. IMPRESSION: 1. Enlarged, heterogeneous right epididymal head with a small, debris containing right hydrocele, consistent with epididymitis. The right testicle is normal in size and appearance. 2. Somewhat heterogeneous echotexture of the left testicle, which is asymmetrically small compared to the right. There is a focal, somewhat hyperechoic region near the testicular hilum measuring 0.6 cm. Suspect that this overall appearance is related to sequelae of prior infection or inflammation, however a small testicular neoplasm is difficult to strictly exclude. Recommend follow-up ultrasound in 3 months to ensure stability. 3. Normal, symmetric arterial and venous Doppler flow is present to the bilateral testicles. Electronically Signed   By: Delanna Ahmadi M.D.   On: 11/04/2021 15:02   DG Chest Port 1 View  Result Date: 11/04/2021 CLINICAL DATA:  Possible sepsis. EXAM: PORTABLE CHEST 1 VIEW COMPARISON:  Chest x-ray dated April 18, 2018. FINDINGS: The heart size and mediastinal contours are within normal limits. Normal pulmonary vascularity. Mild bibasilar atelectasis. No focal consolidation, pleural effusion, or pneumothorax. No acute osseous abnormality. IMPRESSION: 1. Mild bibasilar atelectasis. Electronically Signed   By: Titus Dubin M.D.   On: 11/04/2021 12:18    Scheduled Meds:  aspirin  81 mg Oral BID   brimonidine  1 drop  Right Eye BID   docusate sodium  100 mg Oral BID   enoxaparin (LOVENOX) injection  40 mg Subcutaneous Q24H   insulin aspart  0-9 Units Subcutaneous TID WC   latanoprost  1 drop Right Eye QHS   pantoprazole  40 mg Oral Daily   rosuvastatin  40 mg Oral Daily   tamsulosin  0.4 mg Oral Daily   traMADol  50 mg Oral Daily   Continuous Infusions:  sodium chloride 75 mL/hr at 11/04/21 1850   levofloxacin (LEVAQUIN) IV  LOS: 1 day    Time spent: 25 mins    Shawna Clamp, MD [facility]   If 7PM-7AM, please contact night-coverage

## 2021-11-05 NOTE — Plan of Care (Signed)
°  Problem: Education: Goal: Knowledge of General Education information will improve Description: Including pain rating scale, medication(s)/side effects and non-pharmacologic comfort measures Outcome: Progressing   Problem: Clinical Measurements: Goal: Diagnostic test results will improve Outcome: Progressing   Problem: Activity: Goal: Risk for activity intolerance will decrease Outcome: Progressing   Problem: Coping: Goal: Level of anxiety will decrease Outcome: Progressing

## 2021-11-05 NOTE — TOC Initial Note (Signed)
Transition of Care [facility]) - Initial/Assessment Note    Patient Details  Name: MALACHAI SCHALK MRN: [id number] Date of Birth: [birth year]  Transition of Care [facility]) CM/SW Contact:    Leeroy Cha, RN Phone Number: 11/05/2021, 8:13 AM  Clinical Narrative:                  Transition of Care [facility]) Screening Note   Patient Details  Name: DEONTAY LADNIER Date of Birth: 23-Apr-1946   Transition of Care [facility]) CM/SW Contact:    Leeroy Cha, RN Phone Number: 11/05/2021, 8:13 AM    Transition of Care Department [facility]) has reviewed patient and no TOC needs have been identified at this time. We will continue to monitor patient advancement through interdisciplinary progression rounds. If new patient transition needs arise, please place a TOC consult.    Expected Discharge Plan: Home/Self Care Barriers to Discharge: Continued Medical Work up   Patient Goals and CMS Choice Patient states their goals for this hospitalization and ongoing recovery are:: to go home CMS Medicare.gov Compare Post Acute Care list provided to:: Patient    Expected Discharge Plan and Services Expected Discharge Plan: Home/Self Care   Discharge Planning Services: CM Consult   Living arrangements for the past 2 months: Single Family Home                                      Prior Living Arrangements/Services Living arrangements for the past 2 months: Single Family Home Lives with:: Spouse Patient language and need for interpreter reviewed:: Yes Do you feel safe going back to the place where you live?: Yes               Activities of Daily Living Home Assistive Devices/Equipment: None ADL Screening (condition at time of admission) Patient's cognitive ability adequate to safely complete daily activities?: Yes Is the patient deaf or have difficulty hearing?: No Does the patient have difficulty seeing, even when wearing glasses/contacts?: No Does the patient have difficulty  concentrating, remembering, or making decisions?: No Patient able to express need for assistance with ADLs?: Yes Does the patient have difficulty dressing or bathing?: No Independently performs ADLs?: Yes (appropriate for developmental age) Does the patient have difficulty walking or climbing stairs?: No Weakness of Legs: None Weakness of Arms/Hands: None  Permission Sought/Granted                  Emotional Assessment Appearance:: Appears stated age Attitude/Demeanor/Rapport: Engaged Affect (typically observed): Calm Orientation: : Oriented to Place, Oriented to Self, Oriented to  Time, Oriented to Situation Alcohol / Substance Use: Not Applicable Psych Involvement: No (comment)  Admission diagnosis:  Acute prostatitis [N41.0] Epididymitis [N45.1] Sepsis ([place]) [A41.9] Patient Active Problem List   Diagnosis Date Noted   Sepsis ([place]) 11/04/2021   Persistent cough 10/20/2021   Chronic migraine without aura, with intractable migraine, so stated, with status migrainosus 09/20/2021   Status post total replacement of left hip 05/14/2021   Type 2 diabetes mellitus with hyperglycemia ([place]) 05/23/2018   Chronically dry eyes, bilateral 05/23/2018   Migraines 11/28/2016   Hypertension associated with diabetes ([place]) 10/11/2011   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 Earlean Shawl 01/07/2008   GERD 04/02/2007   Sleep apnea 04/02/2007   PCP:  Vivi Barrack, MD Pharmacy:   [id number] - [place], [place] [address] Phone: [phone] Fax: [phone]     Social Determinants of Health (SDOH) Interventions    Readmission Risk Interventions No flowsheet data found.

## 2021-11-06 LAB — CBC
HCT: 36.3 % — ABNORMAL LOW (ref 39.0–52.0)
Hemoglobin: 12.2 g/dL — ABNORMAL LOW (ref 13.0–17.0)
MCH: 30.3 pg (ref 26.0–34.0)
MCHC: 33.6 g/dL (ref 30.0–36.0)
MCV: 90.1 fL (ref 80.0–100.0)
Platelets: 190 10*3/uL (ref 150–400)
RBC: 4.03 MIL/uL — ABNORMAL LOW (ref 4.22–5.81)
RDW: 14.3 % (ref 11.5–15.5)
WBC: 9.6 10*3/uL (ref 4.0–10.5)
nRBC: 0 % (ref 0.0–0.2)

## 2021-11-06 LAB — GLUCOSE, CAPILLARY
Glucose-Capillary: 169 mg/dL — ABNORMAL HIGH (ref 70–99)
Glucose-Capillary: 147 mg/dL — ABNORMAL HIGH (ref 70–99)
Glucose-Capillary: 213 mg/dL — ABNORMAL HIGH (ref 70–99)

## 2021-11-06 LAB — BASIC METABOLIC PANEL
Anion gap: 9 (ref 5–15)
BUN: 8 mg/dL (ref 8–23)
CO2: 27 mmol/L (ref 22–32)
Calcium: 9 mg/dL (ref 8.9–10.3)
Chloride: 97 mmol/L — ABNORMAL LOW (ref 98–111)
Creatinine, Ser: 0.66 mg/dL (ref 0.61–1.24)
GFR, Estimated: >60 mL/min (ref >60)
Glucose, Bld: 169 mg/dL — ABNORMAL HIGH (ref 70–99)
Potassium: 4.3 mmol/L (ref 3.5–5.1)
Sodium: 133 mmol/L — ABNORMAL LOW (ref 135–145)

## 2021-11-06 LAB — URINE CULTURE
MICRO NUMBER:: 12790716
SPECIMEN QUALITY:: ADEQUATE

## 2021-11-06 LAB — MAGNESIUM: Magnesium: 1.9 mg/dL (ref 1.7–2.4)

## 2021-11-06 LAB — PHOSPHORUS: Phosphorus: 3.2 mg/dL (ref 2.5–4.6)

## 2021-11-06 MED ORDER — LEVOFLOXACIN 500 MG PO TABS: 500.0000 mg | ORAL_TABLET | Freq: Every day | ORAL | 0 refills | Status: AC

## 2021-11-06 NOTE — Discharge Summary (Signed)
Physician Discharge Summary  Robert [name] PFX:[id number] DOB: [birth year] DOA: 11/04/2021  PCP: Vivi Barrack, MD  Admit date: 11/04/2021  Discharge date: 11/06/2021  Admitted From: Home.  Disposition: Home.  Recommendations for Outpatient Follow-up:  Follow up with PCP in 1-2 weeks. Please obtain BMP/CBC in one week. Advised to take Levaquin 500 mg daily for 7 days to complete 10 days treatment for epididymitis. Request PCP to follow up on GC/ Chlamydia.  Home Health: None Equipment/Devices: None  Discharge Condition: Stable CODE STATUS:Full code Diet recommendation: Heart Healthy   Brief Summary / Hospital Course: This 75 years old male with PMH significant for CAD, GERD, hyperlipidemia, hypertension, migraine, OSA on CPAP presented in the ED with complaints of increased urinary frequency, burning urination, dizziness and headache.  Patient went to see his primary care physician who has diagnosed him with prostatitis and was started on ciprofloxacin.  Patient has received call from PCPs office that his white cell count is elevated despite being on antibiotics,  he was advised to go to the ED.  Patient is admitted for possible sepsis secondary to epidydimitis.  Patient is started on IV antibiotics , he was continued on IV hydration.  Urine cultures no growth, blood cultures no growth so far.  Patient denies being sexually active.  Consent obtained to send urine for gonorrhea and chlamydia.  Patient reports he significantly improved, He wants to be discharged.  Patient ambulated in hallway without any support and is being discharged home on Levaquin for 7 more days to complete 10-day treatment for epididymitis.  Advised to follow-up with primary care physician.  He was managed for below problems.  Discharge Diagnoses:  Principal Problem:   Sepsis ([place]) Active Problems:   Hyperlipidemia associated with type 2 diabetes mellitus ([name])   GERD   Prostatitis, recurrent   Sleep apnea    Type 2 diabetes mellitus with hyperglycemia ([name])   Chronic migraine without aura, with intractable migraine, so stated, with status migrainosus  Sepsis secondary to UTI /Epididymitis: Patient presented with fever, leukocytosis, UA+, lactic acid 1.6. Patient was prescribed ciprofloxacin by PCP yesterday for prostatitis. Patient remains febrile at home with leukocytosis found on labs. Continue Levaquin 500 mg IV daily Blood cultures no growth so far, urine cultures negative. Sepsis resolved.  Patient is being discharged home on Levaquin for 7 more days.   CAD: Continue aspirin and rosuvastatin.   Hyperlipidemia: Continue rosuvastatin.   Type 2 diabetes: Continue regular insulin sliding scale. Resume home diabetic medications.   BPH: Continue Flomax   Migraine: Tramadol, resume home meds.   Hyponatremia > Improving This could be secondary to dehydration.     Discharge Instructions  Discharge Instructions     Call MD for:  difficulty breathing, headache or visual disturbances   Complete by: As directed    Call MD for:  persistant dizziness or light-headedness   Complete by: As directed    Call MD for:  persistant nausea and vomiting   Complete by: As directed    Diet - low sodium heart healthy   Complete by: As directed    Diet Carb Modified   Complete by: As directed    Discharge instructions   Complete by: As directed    Advised to follow up with PCP in one week. Advised to take Levaquin 500 mg daily for 7 days to complete 10 days treatment for epididymitis. Request PCP to follow up on GC/ Chlamydia.   Increase activity slowly   Complete by: As  directed       Allergies as of 11/06/2021       Reactions   Sumatriptan Shortness Of Breath   REACTION: sob, headache scalp sensitivity   Penicillins Itching   Oxycodone Itching        Medication List     STOP taking these medications    ciprofloxacin 500 MG tablet Commonly known as: Cipro       TAKE  these medications    acetaminophen 500 MG tablet Commonly known as: TYLENOL Take 1,000 mg by mouth every 6 (six) hours as needed for moderate pain.   albuterol 108 (90 Base) MCG/ACT inhaler Commonly known as: VENTOLIN HFA INHALE 2 PUFFS BY MOUTH FOUR TIMES A DAY FOR BRONCHOSPASMS   aspirin 81 MG chewable tablet Chew 1 tablet (81 mg total) by mouth 2 (two) times daily. What changed: when to take this   azelastine 0.1 % nasal spray Commonly known as: ASTELIN Place 1 spray into both nostrils daily as needed for rhinitis.   benzonatate 100 MG capsule Commonly known as: TESSALON 100 mg 3 (three) times daily as needed for cough.   brimonidine 0.2 % ophthalmic solution Commonly known as: ALPHAGAN Place 1 drop into the right eye in the morning and at bedtime.   Emgality 120 MG/ML Soaj Generic drug: Galcanezumab-gnlm INJECT 120 MG INTO THE SKIN EVERY 30 (THIRTY) DAYS.   fluticasone 50 MCG/ACT nasal spray Commonly known as: FLONASE Place 1 spray into both nostrils daily as needed for allergies.   latanoprost 0.005 % ophthalmic solution Commonly known as: XALATAN Place 1 drop into the right eye at bedtime.   levofloxacin 500 MG tablet Commonly known as: Levaquin Take 1 tablet (500 mg total) by mouth daily for 7 days.   multivitamins ther. w/minerals Tabs tablet Take 1 tablet by mouth daily.   nitroGLYCERIN 0.4 MG SL tablet Commonly known as: NITROSTAT Place 1 tablet (0.4 mg total) under the tongue every 5 (five) minutes as needed for chest pain. Do not exceed more than 3 doses per day.   Nurtec 75 MG Tbdp Generic drug: Rimegepant Sulfate TAKE 75 MG BY MOUTH DAILY AS NEEDED. FOR MIGRAINES. TAKE AS CLOSE TO ONSET OF MIGRAINE AS POSSIBLE. ONE DAILY MAXIMUM. What changed:  reasons to take this additional instructions   pantoprazole 40 MG tablet Commonly known as: PROTONIX Take 40 mg by mouth daily.   pseudoephedrine 60 MG tablet Commonly known as: SUDAFED Take 60 mg by  mouth every 6 (six) hours as needed for congestion.   rosuvastatin 40 MG tablet Commonly known as: CRESTOR Take 40 mg by mouth daily.   SUPER B COMPLEX PO Take 1 tablet by mouth daily.   tamsulosin 0.4 MG Caps capsule Commonly known as: FLOMAX Take 1 capsule (0.4 mg total) by mouth daily.   traMADol 50 MG tablet Commonly known as: ULTRAM Take 50 mg by mouth daily as needed for moderate pain.   vitamin C 1000 MG tablet Take 1,000 mg by mouth daily.   vitamin E 180 MG (400 UNITS) capsule Take 400 Units by mouth daily.   zinc gluconate 50 MG tablet Take 50 mg by mouth daily.        Follow-up Information     Vivi Barrack, MD Follow up in 1 week(s).   Specialty: Family Medicine Contact information: [place] [address] [phone]                Allergies  Allergen Reactions   Sumatriptan  Shortness Of Breath    REACTION: sob, headache scalp sensitivity   Penicillins Itching   Oxycodone Itching    Consultations: None   Procedures/Studies: CT Head Wo Contrast  Result Date: 11/04/2021 CLINICAL DATA:  Head trauma, minor patient fell and hit back of the head on concrete couple of days ago. EXAM: CT HEAD WITHOUT CONTRAST TECHNIQUE: Contiguous axial images were obtained from the base of the skull through the vertex without intravenous contrast. COMPARISON:  MRI examination dated December 16, 2018 FINDINGS: Brain: No evidence of acute infarction, hemorrhage, hydrocephalus, extra-axial collection or mass lesion/mass effect. Mild-to-moderate chronic microvascular ischemic changes of the white matter. Vascular: No hyperdense vessel or unexpected calcification. Skull: Normal. Negative for fracture or focal lesion. Sinuses/Orbits: No acute finding. Other: None. IMPRESSION: No acute intracranial abnormality. Electronically Signed   By: Keane Police D.O.   On: 11/04/2021 15:34   US Scrotum  Result Date: 11/04/2021 CLINICAL DATA:  Right testicle pain  and swelling EXAM: SCROTAL ULTRASOUND DOPPLER ULTRASOUND OF THE TESTICLES TECHNIQUE: Complete ultrasound examination of the testicles, epididymis, and other scrotal structures was performed. Color and spectral Doppler ultrasound were also utilized to evaluate blood flow to the testicles. COMPARISON:  None. FINDINGS: Right testicle Measurements: 3.9 x 2.8 x 3.3 cm. No mass or microlithiasis visualized. Left testicle Measurements: 2.9 x 1.8 x 1.8 cm. Somewhat heterogeneous echotexture of the left testicle, which is asymmetrically small. There is a focal, somewhat hyperechoic region near the testicular hilum measuring 0.6 x 0.5 x 0.4 cm. Right epididymis: Enlarged, heterogeneous right epididymal head. Incidental benign subcentimeter cyst or spermatocele. Left epididymis: Normal in size and appearance. Incidental benign subcentimeter cyst or spermatocele. Hydrocele:  Small debris containing right hydrocele. Varicocele:  None visualized. Pulsed Doppler interrogation of both testes demonstrates normal low resistance arterial and venous waveforms bilaterally. IMPRESSION: 1. Enlarged, heterogeneous right epididymal head with a small, debris containing right hydrocele, consistent with epididymitis. The right testicle is normal in size and appearance. 2. Somewhat heterogeneous echotexture of the left testicle, which is asymmetrically small compared to the right. There is a focal, somewhat hyperechoic region near the testicular hilum measuring 0.6 cm. Suspect that this overall appearance is related to sequelae of prior infection or inflammation, however a small testicular neoplasm is difficult to strictly exclude. Recommend follow-up ultrasound in 3 months to ensure stability. 3. Normal, symmetric arterial and venous Doppler flow is present to the bilateral testicles. Electronically Signed   By: Delanna Ahmadi M.D.   On: 11/04/2021 15:02   DG Chest Port 1 View  Result Date: 11/04/2021 CLINICAL DATA:  Possible sepsis. EXAM:  PORTABLE CHEST 1 VIEW COMPARISON:  Chest x-ray dated April 18, 2018. FINDINGS: The heart size and mediastinal contours are within normal limits. Normal pulmonary vascularity. Mild bibasilar atelectasis. No focal consolidation, pleural effusion, or pneumothorax. No acute osseous abnormality. IMPRESSION: 1. Mild bibasilar atelectasis. Electronically Signed   By: Titus Dubin M.D.   On: 11/04/2021 12:18     Subjective: Patient was seen and examined at bedside.  Overnight events noted.  Patient reports feeling much improved.   He remains afebrile.  Cultures are negative.  Patient wants to be discharged.  Patient being discharged home.  Discharge Exam: Vitals:   11/06/21 0200 11/06/21 1209  BP:  (!) 144/54  Pulse:    Resp:    Temp: 97.8 F (36.6 C) 97.9 F (36.6 C)  SpO2:     Vitals:   11/05/21 2200 11/06/21 0000 11/06/21 0200 11/06/21 1209  BP:    Marland Kitchen)  144/54  Pulse:      Resp:      Temp: 98.2 F (36.8 C) 97.6 F (36.4 C) 97.8 F (36.6 C) 97.9 F (36.6 C)  TempSrc: Oral   Oral  SpO2:      Weight:      Height:        General: Pt is alert, awake, not in acute distress. Cardiovascular: RRR, S1/S2 +, no rubs, no gallops Respiratory: CTA bilaterally, no wheezing, no rhonchi Abdominal: Soft, NT, ND, bowel sounds + Extremities: no edema, no cyanosis. .   The results of significant diagnostics from this hospitalization (including imaging, microbiology, ancillary and laboratory) are listed below for reference.     Microbiology: Recent Results (from the past 240 hour(s))  Blood Culture (routine x 2)     Status: None (Preliminary result)   Collection Time: 11/04/21 12:07 PM   Specimen: Left Antecubital; Blood  Result Value Ref Range Status   Specimen Description   Final    LEFT ANTECUBITAL BLOOD Performed at [facility], [place] [address], [place], [place] [address]    Special Requests   Final    Blood Culture results may not be optimal due to an excessive  volume of blood received in culture bottles BOTTLES DRAWN AEROBIC AND ANAEROBIC Performed at [facility], [place] [address] [place], [place] [address]    Culture   Final    NO GROWTH 2 DAYS Performed at [place] [address] [place] [address] [place], [place] [address]    Report Status PENDING  Incomplete  Resp Panel by RT-PCR (Flu A&B, Covid) Nasopharyngeal Swab     Status: None   Collection Time: 11/04/21 12:23 PM   Specimen: Nasopharyngeal Swab; Nasopharyngeal(NP) swabs in vial transport medium  Result Value Ref Range Status   SARS Coronavirus 2 by RT PCR NEGATIVE NEGATIVE Final    Comment: (NOTE) SARS-CoV-2 target nucleic acids are NOT DETECTED.  The SARS-CoV-2 RNA is generally detectable in upper respiratory specimens during the acute phase of infection. The lowest concentration of SARS-CoV-2 viral copies this assay can detect is 138 copies/mL. A negative result does not preclude SARS-Cov-2 infection and should not be used as the sole basis for treatment or other patient management decisions. A negative result may occur with  improper specimen collection/handling, submission of specimen other than nasopharyngeal swab, presence of viral mutation(s) within the areas targeted by this assay, and inadequate number of viral copies(<138 copies/mL). A negative result must be combined with clinical observations, patient history, and epidemiological information. The expected result is Negative.  Fact Sheet for Patients:  EntrepreneurPulse.com.au  Fact Sheet for Healthcare Providers:  IncredibleEmployment.be  This test is no t yet approved or cleared by the [place] FDA and  has been authorized for detection and/or diagnosis of SARS-CoV-2 by FDA under an Emergency Use Authorization (EUA). This EUA will remain  in effect (meaning this test can be used) for the duration of the COVID-19 declaration under Section 564(b)(1) of the Act,  21 U.S.C.section 360bbb-3(b)(1), unless the authorization is terminated  or revoked sooner.       Influenza A by PCR NEGATIVE NEGATIVE Final   Influenza B by PCR NEGATIVE NEGATIVE Final    Comment: (NOTE) The Xpert Xpress SARS-CoV-2/FLU/RSV plus assay is intended as an aid in the diagnosis of influenza from Nasopharyngeal swab specimens and should not be used as a sole basis for treatment. Nasal washings and aspirates are unacceptable for Xpert Xpress SARS-CoV-2/FLU/RSV testing.  Fact Sheet for Patients: EntrepreneurPulse.com.au  Fact Sheet for Healthcare Providers: IncredibleEmployment.be  This test is not yet approved or cleared by the [place] FDA and has been authorized for detection and/or diagnosis of SARS-CoV-2 by FDA under an Emergency Use Authorization (EUA). This EUA will remain in effect (meaning this test can be used) for the duration of the COVID-19 declaration under Section 564(b)(1) of the Act, 21 U.S.C. section 360bbb-3(b)(1), unless the authorization is terminated or revoked.  Performed at [facility], [place] [address] [place], [place] [address]   Urine Culture     Status: None   Collection Time: 11/04/21  1:10 PM   Specimen: In/Out Cath Urine  Result Value Ref Range Status   Specimen Description   Final    IN/OUT CATH URINE Performed at [place] [address], [place], [place] [address]    Special Requests   Final    NONE Performed at [facility], [place] [address] [place], [place] [address]    Culture   Final    NO GROWTH Performed at [facility], [place] [address] [place], [place] [address]    Report Status 11/05/2021 FINAL  Final  Blood Culture (routine x 2)     Status: None (Preliminary result)   Collection Time: 11/04/21  1:21 PM   Specimen: BLOOD RIGHT HAND  Result Value Ref Range Status   Specimen Description   Final    BLOOD RIGHT  HAND Performed at [place] [address], [place], [place] [address]    Special Requests   Final    BOTTLES DRAWN AEROBIC AND ANAEROBIC Blood Culture results may not be optimal due to an excessive volume of blood received in culture bottles Performed at [place] [address] [place], [place] [address]    Culture   Final    NO GROWTH 2 DAYS Performed at [place] [address] [place],  [address]    Report Status PENDING  Incomplete     Labs: BNP (last 3 results) No results for input(s): BNP in the last 8760 hours. Basic Metabolic Panel: Recent Labs  Lab 11/04/21 0846 11/04/21 1206 11/04/21 1322 11/05/21 0514 11/06/21 0504  NA 131* 129*  --  132* 133*  K 4.0 3.8  --  4.3 4.3  CL 97 95*  --  101 97*  CO2 28 24  --  24 27  GLUCOSE 171* 241*  --  166* 169*  BUN 7 8  --  9 8  CREATININE 0.75 0.71 0.76 0.72 0.66  CALCIUM 9.0 8.7*  --  8.5* 9.0  MG  --   --   --  1.7 1.9  PHOS  --   --   --  2.7 3.2   Liver Function Tests: Recent Labs  Lab 11/04/21 0846 11/04/21 1206 11/05/21 0514  AST 15 20 20   ALT 23 27 25   ALKPHOS 76 76 66  BILITOT 0.8 0.8 0.6  PROT 6.8 7.2 6.4*  ALBUMIN 3.6 3.6 3.0*   No results for input(s): LIPASE, AMYLASE in the last 168 hours. No results for input(s): AMMONIA in the last 168 hours. CBC: Recent Labs  Lab 11/04/21 0846 11/04/21 1206 11/04/21 1322 11/05/21 0514 11/06/21 0504  WBC 18.2 Repeated and verified X2.* 19.5* 18.8* 13.5* 9.6  NEUTROABS 15.4* 16.8*  --   --   --   HGB 12.5* 12.8* 12.1* 11.8* 12.2*  HCT  37.6* 38.3* 36.7* 35.4* 36.3*  MCV 91.0 92.3 94.1 91.5 90.1  PLT 177.0 169 163 160 190   Cardiac Enzymes: No results for input(s): CKTOTAL, CKMB, CKMBINDEX, TROPONINI in the last 168 hours. BNP: Invalid input(s): POCBNP CBG: Recent Labs  Lab 11/05/21 0742 11/05/21 1130 11/05/21 1623 11/06/21 0726 11/06/21 1206  GLUCAP 171* 205* 195* 169* 147*    D-Dimer No results for input(s): DDIMER in the last 72 hours. Hgb A1c Recent Labs    11/04/21 1322  HGBA1C 8.2*   Lipid Profile No results for input(s): CHOL, HDL, LDLCALC, TRIG, CHOLHDL, LDLDIRECT in the last 72 hours. Thyroid function studies No results for input(s): TSH, T4TOTAL, T3FREE, THYROIDAB in the last 72 hours.  Invalid input(s): FREET3 Anemia work up No results for input(s): VITAMINB12, FOLATE, FERRITIN, TIBC, IRON, RETICCTPCT in the last 72 hours. Urinalysis    Component Value Date/Time   COLORURINE YELLOW 11/04/2021 1310   APPEARANCEUR CLOUDY (A) 11/04/2021 1310   LABSPEC >=1.030 11/04/2021 1310   PHURINE 6.0 11/04/2021 1310   GLUCOSEU 500 (A) 11/04/2021 1310   HGBUR SMALL (A) 11/04/2021 1310   BILIRUBINUR NEGATIVE 11/04/2021 1310   BILIRUBINUR 1 11/03/2021 1617   KETONESUR TRACE (A) 11/04/2021 1310   PROTEINUR TRACE (A) 11/04/2021 1310   UROBILINOGEN negative (A) 11/03/2021 1617   NITRITE NEGATIVE 11/04/2021 1310   LEUKOCYTESUR MODERATE (A) 11/04/2021 1310   Sepsis Labs Invalid input(s): PROCALCITONIN,  WBC,  LACTICIDVEN Microbiology Recent Results (from the past 240 hour(s))  Blood Culture (routine x 2)     Status: None (Preliminary result)   Collection Time: 11/04/21 12:07 PM   Specimen: Left Antecubital; Blood  Result Value Ref Range Status   Specimen Description   Final    LEFT ANTECUBITAL BLOOD Performed at [facility], [place] [address] [place], [place] [address]    Special Requests   Final    Blood Culture results may not be optimal due to an excessive volume of blood received in culture bottles BOTTLES DRAWN AEROBIC AND ANAEROBIC Performed at [facility], [place] [address], [place], [place] [address]    Culture   Final    NO GROWTH 2 DAYS Performed at [facility] [address], [place], [place] [address]    Report Status PENDING  Incomplete  Resp Panel by RT-PCR (Flu A&B, Covid) Nasopharyngeal  Swab     Status: None   Collection Time: 11/04/21 12:23 PM   Specimen: Nasopharyngeal Swab; Nasopharyngeal(NP) swabs in vial transport medium  Result Value Ref Range Status   SARS Coronavirus 2 by RT PCR NEGATIVE NEGATIVE Final    Comment: (NOTE) SARS-CoV-2 target nucleic acids are NOT DETECTED.  The SARS-CoV-2 RNA is generally detectable in upper respiratory specimens during the acute phase of infection. The lowest concentration of SARS-CoV-2 viral copies this assay can detect is 138 copies/mL. A negative result does not preclude SARS-Cov-2 infection and should not be used as the sole basis for treatment or other patient management decisions. A negative result may occur with  improper specimen collection/handling, submission of specimen other than nasopharyngeal swab, presence of viral mutation(s) within the areas targeted by this assay, and inadequate number of viral copies(<138 copies/mL). A negative result must be combined with clinical observations, patient history, and epidemiological information. The expected result is Negative.  Fact Sheet for Patients:  EntrepreneurPulse.com.au  Fact Sheet for Healthcare Providers:  IncredibleEmployment.be  This test is no t yet approved or cleared by the [place] FDA  and  has been authorized for detection and/or diagnosis of SARS-CoV-2 by FDA under an Emergency Use Authorization (EUA). This EUA will remain  in effect (meaning this test can be used) for the duration of the COVID-19 declaration under Section 564(b)(1) of the Act, 21 U.S.C.section 360bbb-3(b)(1), unless the authorization is terminated  or revoked sooner.       Influenza A by PCR NEGATIVE NEGATIVE Final   Influenza B by PCR NEGATIVE NEGATIVE Final    Comment: (NOTE) The Xpert Xpress SARS-CoV-2/FLU/RSV plus assay is intended as an aid in the diagnosis of influenza from Nasopharyngeal swab specimens and should not be used as a sole  basis for treatment. Nasal washings and aspirates are unacceptable for Xpert Xpress SARS-CoV-2/FLU/RSV testing.  Fact Sheet for Patients: EntrepreneurPulse.com.au  Fact Sheet for Healthcare Providers: IncredibleEmployment.be  This test is not yet approved or cleared by the [place] FDA and has been authorized for detection and/or diagnosis of SARS-CoV-2 by FDA under an Emergency Use Authorization (EUA). This EUA will remain in effect (meaning this test can be used) for the duration of the COVID-19 declaration under Section 564(b)(1) of the Act, 21 U.S.C. section 360bbb-3(b)(1), unless the authorization is terminated or revoked.  Performed at [facility], [place] [address], [place], [place] [address]   Urine Culture     Status: None   Collection Time: 11/04/21  1:10 PM   Specimen: In/Out Cath Urine  Result Value Ref Range Status   Specimen Description   Final    IN/OUT CATH URINE Performed at [facility] [address] [place], [place] [address]    Special Requests   Final    NONE Performed at [facility], [place] [address], [place], [place] [address]    Culture   Final    NO GROWTH Performed at [facility], [place] [address] [place], [place] [address]    Report Status 11/05/2021 FINAL  Final  Blood Culture (routine x 2)     Status: None (Preliminary result)   Collection Time: 11/04/21  1:21 PM   Specimen: BLOOD RIGHT HAND  Result Value Ref Range Status   Specimen Description   Final    BLOOD RIGHT HAND Performed at [place] [address] [place], [place] [address]    Special Requests   Final    BOTTLES DRAWN AEROBIC AND ANAEROBIC Blood Culture results may not be optimal due to an excessive volume of blood received in culture bottles Performed at [facility] [address] [place],  [address]    Culture   Final     NO GROWTH 2 DAYS Performed at [place] [address] [place],  [address]    Report Status PENDING  Incomplete     Time coordinating discharge: Over 30 minutes  SIGNED:   Shawna Clamp, MD  [facility] 11/06/2021, 3:44 PM Pager   If 7PM-7AM, please contact night-coverage

## 2021-11-06 NOTE — Evaluation (Signed)
Physical Therapy Evaluation Patient Details Name: Robert [name] MRN: [id number] DOB: October 17, 1946 Today's Date: 11/06/2021  History of Present Illness  75 years old male with PMH: L DA THA, CAD, GERD, HLD, HTN, DM, migraine, CAD, OSA on CPAP presented in the ED with complaints of increased urinary frequency burning urination dizziness and headache, dx with prostatitis, epidydimitis --> sepsis  Clinical Impression  Patient evaluated by Physical Therapy with no further acute PT needs identified. All education has been completed and the patient has no further questions.  Pt anxious to d/c home; gait stability improved with distance, encouraged pt to use cane for a few days to incr gait stability and safety initially. Discussed safety/activity progression at home. No f/u indicated at this time  See below for any follow-up Physical Therapy or equipment needs. PT is signing off. Thank you for this referral.        Recommendations for follow up therapy are one component of a multi-disciplinary discharge planning process, led by the attending physician.  Recommendations may be updated based on patient status, additional functional criteria and insurance authorization.  Follow Up Recommendations No PT follow up    Assistance Recommended at Discharge PRN  Functional Status Assessment Patient has had a recent decline in their functional status and demonstrates the ability to make significant improvements in function in a reasonable and predictable amount of time.  Equipment Recommendations  None recommended by PT    Recommendations for Other Services       Precautions / Restrictions Precautions Precautions: Fall Restrictions Weight Bearing Restrictions: No      Mobility  Bed Mobility Overal bed mobility: Needs Assistance Bed Mobility: Supine to Sit;Sit to Supine     Supine to sit: Modified independent (Device/Increase time) Sit to supine: Modified independent (Device/Increase time)         Transfers Overall transfer level: Needs assistance Equipment used: Rolling walker (2 wheels) Transfers: Sit to/from Stand Sit to Stand: Supervision           General transfer comment: for safety    Ambulation/Gait Ambulation/Gait assistance: Supervision;Min guard Gait Distance (Feet): 200 Feet Assistive device: IV Pole;None Gait Pattern/deviations: Step-through pattern;Decreased stride length       General Gait Details: overall unsteady gait intially, without overt LOB, stability improved with distance. initial IV pole support, to no device with supervision for safety  Stairs            Wheelchair Mobility    Modified Rankin (Stroke Patients Only)       Balance Overall balance assessment: Needs assistance Sitting-balance support: No upper extremity supported;Feet supported Sitting balance-Leahy Scale: Good     Standing balance support: During functional activity;No upper extremity supported Standing balance-Leahy Scale: Fair Standing balance comment: fait to good, NT to mod perturbations             High level balance activites: Direction changes;Turns;Head turns High Level Balance Comments: no LOB with above, supervision for safety             Pertinent Vitals/Pain Pain Assessment: No/denies pain    Home Living Family/patient expects to be discharged to:: Private residence Living Arrangements: Spouse/significant other Available Help at Discharge: Family Type of Home: House Home Access: Level entry       Home Layout: Two level;Full bath on main level;Able to live on main level with bedroom/bathroom Home Equipment: Rolling Walker (2 wheels);Cane - single point Additional Comments: dtr and grandson close by, supportive    Prior Function Prior  Level of Function : Independent/Modified Independent                     Hand Dominance        Extremity/Trunk Assessment   Upper Extremity Assessment Upper Extremity Assessment:  Overall WFL for tasks assessed    Lower Extremity Assessment Lower Extremity Assessment: Overall WFL for tasks assessed       Communication   Communication: No difficulties  Cognition Arousal/Alertness: Awake/alert Behavior During Therapy: WFL for tasks assessed/performed Overall Cognitive Status: Within Functional Limits for tasks assessed                                          General Comments      Exercises     Assessment/Plan    PT Assessment Patient does not need any further PT services  PT Problem List         PT Treatment Interventions      PT Goals (Current goals can be found in the Care Plan section)  Acute Rehab PT Goals Patient Stated Goal: home soon PT Goal Formulation: All assessment and education complete, DC therapy    Frequency     Barriers to discharge        Co-evaluation               AM-PAC PT "6 Clicks" Mobility  Outcome Measure Help needed turning from your back to your side while in a flat bed without using bedrails?: None Help needed moving from lying on your back to sitting on the side of a flat bed without using bedrails?: None Help needed moving to and from a bed to a chair (including a wheelchair)?: None Help needed standing up from a chair using your arms (e.g., wheelchair or bedside chair)?: None Help needed to walk in hospital room?: None Help needed climbing 3-5 steps with a railing? : A Little 6 Click Score: 23    End of Session   Activity Tolerance: Patient tolerated treatment well Patient left: in bed;with call bell/phone within reach;with bed alarm set;with family/visitor present   PT Visit Diagnosis: Other abnormalities of gait and mobility (R26.89);Difficulty in walking, not elsewhere classified (R26.2)    Time: 7829-5621 PT Time Calculation (min) (ACUTE ONLY): 16 min   Charges:   PT Evaluation $PT Eval Low Complexity: [place], PT  Acute Rehab Dept ([place])  [phone] Pager [phone]  11/06/2021   [facility] 11/06/2021, 3:04 PM

## 2021-11-06 NOTE — Plan of Care (Signed)

## 2021-11-06 NOTE — Discharge Instructions (Signed)
Advised to follow up with PCP in one week. Advised to take Levaquin 500 mg daily for 7 days to complete 10 days treatment for epididymitis. Request PCP to follow up on GC/ Chlamydia.

## 2021-11-06 NOTE — Plan of Care (Signed)
°  Problem: Education: Goal: Knowledge of General Education information will improve Description: Including pain rating scale, medication(s)/side effects and non-pharmacologic comfort measures 11/06/2021 1732 by Lennie Hummer, RN Outcome: Adequate for Discharge 11/06/2021 1730 by Lennie Hummer, RN Outcome: Adequate for Discharge   Problem: Health Behavior/Discharge Planning: Goal: Ability to manage health-related needs will improve 11/06/2021 1732 by Lennie Hummer, RN Outcome: Adequate for Discharge 11/06/2021 1730 by Lennie Hummer, RN Outcome: Adequate for Discharge   Problem: Clinical Measurements: Goal: Ability to maintain clinical measurements within normal limits will improve 11/06/2021 1732 by Lennie Hummer, RN Outcome: Adequate for Discharge 11/06/2021 1730 by Lennie Hummer, RN Outcome: Adequate for Discharge Goal: Will remain free from infection 11/06/2021 1732 by Lennie Hummer, RN Outcome: Adequate for Discharge 11/06/2021 1730 by Lennie Hummer, RN Outcome: Adequate for Discharge Goal: Diagnostic test results will improve 11/06/2021 1732 by Lennie Hummer, RN Outcome: Adequate for Discharge 11/06/2021 1730 by Lennie Hummer, RN Outcome: Adequate for Discharge Goal: Respiratory complications will improve 11/06/2021 1732 by Lennie Hummer, RN Outcome: Adequate for Discharge 11/06/2021 1730 by Lennie Hummer, RN Outcome: Adequate for Discharge Goal: Cardiovascular complication will be avoided 11/06/2021 1732 by Lennie Hummer, RN Outcome: Adequate for Discharge 11/06/2021 1730 by Lennie Hummer, RN Outcome: Adequate for Discharge   Problem: Activity: Goal: Risk for activity intolerance will decrease 11/06/2021 1732 by Lennie Hummer, RN Outcome: Adequate for Discharge 11/06/2021 1730 by Lennie Hummer, RN Outcome: Adequate for Discharge   Problem: Nutrition: Goal: Adequate nutrition will be maintained 11/06/2021 1732 by Lennie Hummer,  RN Outcome: Adequate for Discharge 11/06/2021 1730 by Lennie Hummer, RN Outcome: Adequate for Discharge   Problem: Coping: Goal: Level of anxiety will decrease 11/06/2021 1732 by Lennie Hummer, RN Outcome: Adequate for Discharge 11/06/2021 1730 by Lennie Hummer, RN Outcome: Adequate for Discharge   Problem: Elimination: Goal: Will not experience complications related to bowel motility 11/06/2021 1732 by Lennie Hummer, RN Outcome: Adequate for Discharge 11/06/2021 1730 by Lennie Hummer, RN Outcome: Adequate for Discharge Goal: Will not experience complications related to urinary retention 11/06/2021 1732 by Lennie Hummer, RN Outcome: Adequate for Discharge 11/06/2021 1730 by Lennie Hummer, RN Outcome: Adequate for Discharge   Problem: Pain Managment: Goal: General experience of comfort will improve 11/06/2021 1732 by Lennie Hummer, RN Outcome: Adequate for Discharge 11/06/2021 1730 by Lennie Hummer, RN Outcome: Adequate for Discharge   Problem: Safety: Goal: Ability to remain free from injury will improve 11/06/2021 1732 by Lennie Hummer, RN Outcome: Adequate for Discharge 11/06/2021 1730 by Lennie Hummer, RN Outcome: Adequate for Discharge   Problem: Skin Integrity: Goal: Risk for impaired skin integrity will decrease 11/06/2021 1732 by Lennie Hummer, RN Outcome: Adequate for Discharge 11/06/2021 1730 by Lennie Hummer, RN Outcome: Adequate for Discharge   Problem: Fluid Volume: Goal: Hemodynamic stability will improve 11/06/2021 1732 by Lennie Hummer, RN Outcome: Adequate for Discharge 11/06/2021 1730 by Lennie Hummer, RN Outcome: Adequate for Discharge   Problem: Clinical Measurements: Goal: Diagnostic test results will improve 11/06/2021 1732 by Lennie Hummer, RN Outcome: Adequate for Discharge 11/06/2021 1730 by Lennie Hummer, RN Outcome: Adequate for Discharge Goal: Signs and symptoms of infection will  decrease 11/06/2021 1732 by Lennie Hummer, RN Outcome: Adequate for Discharge 11/06/2021 1730 by Lennie Hummer, RN Outcome: Adequate for Discharge   Problem: Respiratory: Goal: Ability to maintain adequate ventilation will improve 11/06/2021 1732 by Lennie Hummer, RN Outcome: Adequate for Discharge 11/06/2021 1730 by Lennie Hummer, RN Outcome: Adequate for Discharge

## 2021-11-09 LAB — CULTURE, BLOOD (ROUTINE X 2)
Culture: NO GROWTH
Culture: NO GROWTH

## 2021-11-09 LAB — GC/CHLAMYDIA PROBE AMP (~~LOC~~) NOT AT ARMC
Chlamydia: NEGATIVE
Comment: NEGATIVE
Comment: NORMAL
Neisseria Gonorrhea: NEGATIVE

## 2021-11-11 ENCOUNTER — Inpatient Hospital Stay: Payer: Medicare HMO | Admitting: Family Medicine

## 2021-11-11 DIAGNOSIS — R3912 Poor urinary stream: Secondary | ICD-10-CM | POA: Diagnosis not present

## 2021-11-11 DIAGNOSIS — R350 Frequency of micturition: Secondary | ICD-10-CM | POA: Diagnosis not present

## 2021-11-11 DIAGNOSIS — N410 Acute prostatitis: Secondary | ICD-10-CM | POA: Diagnosis not present

## 2021-11-11 DIAGNOSIS — N401 Enlarged prostate with lower urinary tract symptoms: Secondary | ICD-10-CM | POA: Diagnosis not present

## 2021-11-11 DIAGNOSIS — N453 Epididymo-orchitis: Secondary | ICD-10-CM | POA: Diagnosis not present

## 2021-11-12 ENCOUNTER — Ambulatory Visit (INDEPENDENT_AMBULATORY_CARE_PROVIDER_SITE_OTHER): Payer: Medicare HMO | Admitting: Family Medicine

## 2021-11-12 ENCOUNTER — Other Ambulatory Visit: Payer: Self-pay

## 2021-11-12 VITALS — BP 145/61 | HR 70 | Temp 97.2°F | Ht 73.0 in | Wt 208.2 lb

## 2021-11-12 DIAGNOSIS — N411 Chronic prostatitis: Secondary | ICD-10-CM

## 2021-11-12 DIAGNOSIS — E1165 Type 2 diabetes mellitus with hyperglycemia: Secondary | ICD-10-CM

## 2021-11-12 DIAGNOSIS — I152 Hypertension secondary to endocrine disorders: Secondary | ICD-10-CM

## 2021-11-12 DIAGNOSIS — E1159 Type 2 diabetes mellitus with other circulatory complications: Secondary | ICD-10-CM | POA: Diagnosis not present

## 2021-11-12 LAB — POCT GLYCOSYLATED HEMOGLOBIN (HGB A1C): Hemoglobin A1C: 7.9 % — AB (ref 4.0–5.6)

## 2021-11-12 NOTE — Assessment & Plan Note (Signed)
At goal per JNC 8 on metoprolol tartrate 12.5 mg daily.  This was taken off med list at time of discharge.  We will re add on list today though he has not stopped taking at home.

## 2021-11-12 NOTE — Progress Notes (Signed)
° °  Robert [name] is a 75 y.o. male who presents today for an office visit.  Assessment/Plan:  Chronic Problems Addressed Today: Prostatitis, recurrent Doing much better.  No signs of systemic infection today.  He will finish his course of Levaquin.  He will follow-up with urology as previously planned.  He will let me know if symptoms do not continue to improve.  Hypertension associated with diabetes ([place]) At goal per JNC 8 on metoprolol tartrate 12.5 mg daily.  This was taken off med list at time of discharge.  We will re add on list today though he has not stopped taking at home.  Type 2 diabetes mellitus with hyperglycemia ([name]) A1c 7.9.  Likely elevated due to recent prostatitis and infection.  Has previously been well controlled.  Would not start any medications today.  He will come back in 3 months to recheck A1c.  If persistently elevated would consider starting metformin.     Subjective:  HPI:  Patient here for hospital follow up. He presented to the ED on 11/04/2021 with increased urinary frequency, dysuria, dizziness and headache. He had elevated WBC and was admitted for concern for possible sepsis. He was started on IV fluids and antibiotics. His symptoms improved significantly and he was discharged home with levaquin to complete a 10 day course of antibiotics for prostatitis. He was discharged on 11/06/2021.   He has done well since discharge.  He is still on his course of Levaquin.  He saw urologist yesterday.  His course of Levaquin was extended for another 10 days.  No recurrent fevers.  Still has some dizziness that seems to be improving.  He is concerned about his sugar running high.  Is been compliant with follow-ups medications with no missed doses.  No significant side effects.       Objective:  Physical Exam: BP (!) 145/61    Pulse 70    Temp (!) 97.2 F (36.2 C)    Ht 6\' 1"  (1.854 m)    Wt 208 lb 3.2 oz (94.4 kg)    SpO2 96%    BMI 27.47 kg/m   Gen: No acute  distress, resting comfortably CV: Regular rate and rhythm with no murmurs appreciated Pulm: Normal work of breathing, clear to auscultation bilaterally with no crackles, wheezes, or rhonchi Neuro: Grossly normal, moves all extremities Psych: Normal affect and thought content  Time Spent: 45 minutes of total time was spent on the date of the encounter performing the following actions: chart review prior to seeing the patient including recent hospitalization, obtaining history, performing a medically necessary exam, counseling on the treatment plan, placing orders, and documenting in our EHR.        Algis Greenhouse. Jerline Pain, MD 11/12/2021 2:57 PM

## 2021-11-12 NOTE — Assessment & Plan Note (Signed)
Doing much better.  No signs of systemic infection today.  He will finish his course of Levaquin.  He will follow-up with urology as previously planned.  He will let me know if symptoms do not continue to improve.

## 2021-11-12 NOTE — Patient Instructions (Signed)
It was very nice to see you today!  I am glad that you are feeling better.  Please come back in 3 months to recheck your A1c.  Come back sooner if needed.  Take care, Dr Jerline Pain  PLEASE NOTE:  If you had any lab tests please let us know if you have not heard back within a few days. You may see your results on mychart before we have a chance to review them but we will give you a call once they are reviewed by [place]. If we ordered any referrals today, please let us know if you have not heard from their office within the next week.   Please try these tips to maintain a healthy lifestyle:  Eat at least 3 REAL meals and 1-2 snacks per day.  Aim for no more than 5 hours between eating.  If you eat breakfast, please do so within one hour of getting up.   Each meal should contain half fruits/vegetables, one quarter protein, and one quarter carbs (no bigger than a computer mouse)  Cut down on sweet beverages. This includes juice, soda, and sweet tea.   Drink at least 1 glass of water with each meal and aim for at least 8 glasses per day  Exercise at least 150 minutes every week.

## 2021-11-12 NOTE — Assessment & Plan Note (Signed)
A1c 7.9.  Likely elevated due to recent prostatitis and infection.  Has previously been well controlled.  Would not start any medications today.  He will come back in 3 months to recheck A1c.  If persistently elevated would consider starting metformin.

## 2021-11-14 ENCOUNTER — Encounter: Payer: Self-pay | Admitting: Neurology

## 2021-11-17 ENCOUNTER — Other Ambulatory Visit: Payer: Self-pay

## 2021-11-23 ENCOUNTER — Telehealth: Payer: Self-pay | Admitting: Physical Medicine and Rehabilitation

## 2021-11-23 NOTE — Telephone Encounter (Signed)
Patient called needing to Cx his appointment     Ph# [phone]

## 2021-11-25 ENCOUNTER — Telehealth: Payer: Self-pay

## 2021-11-25 NOTE — Telephone Encounter (Signed)
Patient Name: Robert [name] Gender: Male DOB: 12-05-1945 Age: 76 Y 11 M 21 D Return Phone Number: [phone] (Primary), [phone] (Secondary) Address: City/ State/ Zip: [place]  [address] Client [facility] [place] [place] Provider Dimas Chyle- MD Contact Type Call Who Is Calling Patient / Member / Family / Caregiver Call Type Triage / Clinical Relationship To Patient Self Return Phone Number [phone] (Primary) Chief Complaint Flu Symptom Reason for Call Symptomatic / Request for [place] states the patient was in the office on 12/21, had flu like symptoms and head trauma. Patient is unbalanced and having a high white blood cell count. Translation No Nurse Assessment Nurse: Ellery Plunk, RN, Danica Date/Time (Eastern Time): 11/25/2021 4:02:05 PM Confirm and document reason for call. If symptomatic, describe symptoms. ---Caller states he was seen in the office on 12/21 and found to have a very high wbc count, was in the hospital for sepsis for 3 days. Went home with abx and finished this last week. Since the weekend, has been unsteady when he gets up in the morning and having headaches (does have a hx of migraines and vertigo). Has to use a cane lately which is not like him. Worried he has sepsis again. Does the patient have any new or worsening symptoms? ---Yes Will a triage be completed? ---Yes Related visit to physician within the last 2 weeks? ---No Does the PT have any chronic conditions? (i.e. diabetes, asthma, this includes High risk factors for pregnancy, etc.) ---Yes List chronic conditions. ---vertigo, migraines Is this a behavioral health or substance abuse call? ---No Guidelines Guideline Title Affirmed Question Affirmed Notes Nurse Date/Time (Eastern Time) Dizziness - Lightheadedness SEVERE dizziness (e.g., unable to  stand, Bringas, RN, Danica 11/25/2021 4:04:51  Guidelines Guideline Title Affirmed Question Affirmed Notes Nurse Date/Time Eilene Ghazi Time) requires support to walk, feels like passing out now) Disp. Time Eilene Ghazi Time) Disposition Final User 11/25/2021 4:16:39 PM Go to ED Now (or PCP triage) Yes Ellery Plunk, RN, Fredric Dine Caller Disagree/Comply Disagree Caller Understands Yes PreDisposition Call Doctor Care Advice Given Per Guideline GO TO ED NOW (OR PCP TRIAGE): * IF NO PCP (PRIMARY CARE PROVIDER) SECOND-LEVEL TRIAGE: You need to be seen within the next hour. Go to the [facility] at _____________ [place] as soon as you can. CARE ADVICE given per Dizziness (Adult) guideline. Comments User: Harriette Bouillon, RN Date/Time Eilene Ghazi Time): 11/25/2021 4:18:52 PM triager attempted to call office backline for a same day appointment per caller's request. Office was unable to squeeze caller in today. Triager explained this to caller and states he still she get seen by a provider in the next hour at an UC or ED. Patient states he will just call PCP tomorrow morning for an appointment Referrals REFERRED TO PCP OFFICE

## 2021-11-25 NOTE — Telephone Encounter (Signed)
Spoke with patient, stated was send to ED due to elevated WBC  Was doing better after discharge Now is feeling low energy, dizzy and unsteady Advise to go to ED if Symptoms worsen  Will schedule appointment for tomorrow with any open provider  Tammy will call back with appointment information

## 2021-11-26 ENCOUNTER — Encounter: Payer: Self-pay | Admitting: Family Medicine

## 2021-11-26 ENCOUNTER — Other Ambulatory Visit: Payer: Self-pay

## 2021-11-26 ENCOUNTER — Ambulatory Visit (INDEPENDENT_AMBULATORY_CARE_PROVIDER_SITE_OTHER): Payer: Medicare HMO | Admitting: Family Medicine

## 2021-11-26 VITALS — BP 122/60 | HR 73 | Temp 97.0°F | Ht 73.0 in | Wt 212.0 lb

## 2021-11-26 DIAGNOSIS — G43109 Migraine with aura, not intractable, without status migrainosus: Secondary | ICD-10-CM

## 2021-11-26 DIAGNOSIS — N411 Chronic prostatitis: Secondary | ICD-10-CM

## 2021-11-26 LAB — URINALYSIS, ROUTINE W REFLEX MICROSCOPIC
Bilirubin Urine: NEGATIVE
Hgb urine dipstick: NEGATIVE
Ketones, ur: NEGATIVE
Leukocytes,Ua: NEGATIVE
Nitrite: NEGATIVE
Specific Gravity, Urine: 1.015 (ref 1.000–1.030)
Total Protein, Urine: NEGATIVE
Urine Glucose: NEGATIVE
Urobilinogen, UA: 0.2 (ref 0.0–1.0)
pH: 6 (ref 5.0–8.0)

## 2021-11-26 LAB — COMPREHENSIVE METABOLIC PANEL
ALT: 23 U/L (ref 0–53)
AST: 20 U/L (ref 0–37)
Albumin: 4 g/dL (ref 3.5–5.2)
Alkaline Phosphatase: 69 U/L (ref 39–117)
BUN: 11 mg/dL (ref 6–23)
CO2: 30 mEq/L (ref 19–32)
Calcium: 9.4 mg/dL (ref 8.4–10.5)
Chloride: 102 mEq/L (ref 96–112)
Creatinine, Ser: 0.85 mg/dL (ref 0.40–1.50)
GFR: 83.54 mL/min (ref >60.00)
Glucose, Bld: 131 mg/dL — ABNORMAL HIGH (ref 70–99)
Potassium: 4.7 mEq/L (ref 3.5–5.1)
Sodium: 141 mEq/L (ref 135–145)
Total Bilirubin: 0.5 mg/dL (ref 0.2–1.2)
Total Protein: 6.4 g/dL (ref 6.0–8.3)

## 2021-11-26 LAB — CBC WITH DIFFERENTIAL/PLATELET
Basophils Absolute: 0.1 10*3/uL (ref 0.0–0.1)
Basophils Relative: 1 % (ref 0.0–3.0)
Eosinophils Absolute: 0.1 10*3/uL (ref 0.0–0.7)
Eosinophils Relative: 1.5 % (ref 0.0–5.0)
HCT: 39.3 % (ref 39.0–52.0)
Hemoglobin: 12.7 g/dL — ABNORMAL LOW (ref 13.0–17.0)
Lymphocytes Relative: 27.8 % (ref 12.0–46.0)
Lymphs Abs: 1.5 10*3/uL (ref 0.7–4.0)
MCHC: 32.4 g/dL (ref 30.0–36.0)
MCV: 93 fl (ref 78.0–100.0)
Monocytes Absolute: 0.4 10*3/uL (ref 0.1–1.0)
Monocytes Relative: 7.9 % (ref 3.0–12.0)
Neutro Abs: 3.3 10*3/uL (ref 1.4–7.7)
Neutrophils Relative %: 61.8 % (ref 43.0–77.0)
Platelets: 158 10*3/uL (ref 150.0–400.0)
RBC: 4.23 Mil/uL (ref 4.22–5.81)
RDW: 15.4 % (ref 11.5–15.5)
WBC: 5.4 10*3/uL (ref 4.0–10.5)

## 2021-11-26 LAB — C-REACTIVE PROTEIN: CRP: <1 mg/dL (ref 0.5–20.0)

## 2021-11-26 LAB — SEDIMENTATION RATE: Sed Rate: 34 mm/hr — ABNORMAL HIGH (ref 0–20)

## 2021-11-26 MED ORDER — LEVOFLOXACIN 500 MG PO TABS: 500.0000 mg | ORAL_TABLET | Freq: Every day | ORAL | 0 refills | Status: AC

## 2021-11-26 MED ORDER — KETOROLAC TROMETHAMINE 60 MG/2ML IM SOLN
60.0000 mg | Freq: Once | INTRAMUSCULAR | Status: AC
  Administered 2021-11-26: 60 mg via INTRAMUSCULAR

## 2021-11-26 NOTE — Assessment & Plan Note (Signed)
He is not having any urinary symptoms though it is possible that some of his systemic symptoms could be due to recurrence of his prostatitis.  He completed about 2 weeks of antibiotics.  We will check labs and urinalysis today.  I will send in a pocket prescription for Levaquin with instruction to not start unless his symptoms worsen or if his labs come back positive.  He will follow-up with urology soon.

## 2021-11-26 NOTE — Patient Instructions (Signed)
It was very nice to see you today!  We we will give you injection of medication called Toradol today.  This should help alleviate your migraine.  We will check blood work and urine sample to make sure that you are not having an infection in your prostate.  If your symptoms worsen please start the Levaquin.    Please send a message next week to let me know how you are doing.  Take care, Dr Jerline Pain  PLEASE NOTE:  If you had any lab tests please let us know if you have not heard back within a few days. You may see your results on mychart before we have a chance to review them but we will give you a call once they are reviewed by [place]. If we ordered any referrals today, please let us know if you have not heard from their office within the next week.   Please try these tips to maintain a healthy lifestyle:  Eat at least 3 REAL meals and 1-2 snacks per day.  Aim for no more than 5 hours between eating.  If you eat breakfast, please do so within one hour of getting up.   Each meal should contain half fruits/vegetables, one quarter protein, and one quarter carbs (no bigger than a computer mouse)  Cut down on sweet beverages. This includes juice, soda, and sweet tea.   Drink at least 1 glass of water with each meal and aim for at least 8 glasses per day  Exercise at least 150 minutes every week.

## 2021-11-26 NOTE — Assessment & Plan Note (Signed)
Follows with neurology.  He is on Emgality injections.  This seems to usually work pretty well but has had some flare over the last couple of weeks.  His normal treatment has not been particularly effective.  We will give 60 mg Toradol today.  He will follow-up with me early next week.

## 2021-11-26 NOTE — Progress Notes (Signed)
Robert [name] is a 76 y.o. male who presents today for an office visit.  Assessment/Plan:  New/Acute Problems: Vertigo/dizziness Reassuring neuro exam.  His vertigo seems to be positional in nature and he has a known history of BPPV.  Could be due to migraine flare well.  We will give injection of 60 mg of Toradol today to help with his current migraine.  Do not think this is related to any sort of prostatitis that we will rule out as below.  Encourage patient to stay well-hydrated.  He will follow-up with me in a couple of days via MyChart to let me know how symptoms are progressing.  We discussed reasons to return to care and seek emergent care.  Given the stability of the symptoms and reassuring neuro exam do not think he needs any advanced imaging at this point.  Chronic Problems Addressed Today: Prostatitis, recurrent He is not having any urinary symptoms though it is possible that some of his systemic symptoms could be due to recurrence of his prostatitis.  He completed about 2 weeks of antibiotics.  We will check labs and urinalysis today.  I will send in a pocket prescription for Levaquin with instruction to not start unless his symptoms worsen or if his labs come back positive.  He will follow-up with urology soon.   Migraines Follows with neurology.  He is on Emgality injections.  This seems to usually work pretty well but has had some flare over the last couple of weeks.  His normal treatment has not been particularly effective.  We will give 60 mg Toradol today.  He will follow-up with me early next week.     Subjective:  HPI:  Patient here with concerns for recurrent prostatitis.  He was in the emergency room about 3 weeks ago and was eventually admitted for sepsis due to to concern for prostatitis.  He followed up with me a couple of weeks ago and was doing well at that time.  He finished his course of antibiotics about 10 days ago.  About a week ago he started noticing increasing  fatigue, dizziness, malaise, and low energy.  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  Migraines have been a little bit more frequently over the last couple of weeks.  He has tried taking tramadol and Tylenol without significant improvement.  He has not had any urinary symptoms.  No dysuria.  No fevers or chills.  No urinary hesitancy.  He saw his urologist since his hospital discharge and was told that everything was normal.  No weakness or numbness.  His dizziness is described as a vertigo sensation.  He has dealt with this in the past.  It is worsened with any sort of head movement.  Has minimal symptoms at rest.  Symptoms worsening for several days but seem to be improving over the last day or so.        Objective:  Physical Exam: BP 122/60 (BP Location: Left Arm, Patient Position: Sitting, Cuff Size: Normal)    Pulse 73    Temp (!) 97 F (36.1 C) (Temporal)    Ht 6\' 1"  (1.854 m)    Wt 212 lb (96.2 kg)    SpO2 96%    BMI 27.97 kg/m   Gen: No acute distress, resting comfortably CV: Regular rate and rhythm with no murmurs appreciated Pulm: Normal work of breathing,  clear to auscultation bilaterally with no crackles, wheezes, or rhonchi Neuro: Cranial nerves II through XII intact.  Strength out of 5 in upper and lower extremities.  Finger-nose-finger testing intact bilaterally. Psych: Normal affect and thought content      [name] M. Jerline Pain, MD 11/26/2021 12:08 PM

## 2021-11-27 LAB — URINE CULTURE
MICRO NUMBER:: 12868470
Result:: NO GROWTH
SPECIMEN QUALITY:: ADEQUATE

## 2021-11-29 ENCOUNTER — Telehealth: Payer: Self-pay

## 2021-11-29 NOTE — Telephone Encounter (Signed)
Patients wife calling back about patients lab results.

## 2021-11-29 NOTE — Telephone Encounter (Signed)
Patient calling about his lab results.

## 2021-11-29 NOTE — Telephone Encounter (Signed)
See results note. 

## 2021-11-29 NOTE — Progress Notes (Signed)
Please inform patient of the following:  One of his inflammatory marker is a little elevated but everything else is NORMAL. His blood counts are normal and he has no sign of UTI. Would like for him to let us know if his symptoms are not improving.  Algis Greenhouse. Jerline Pain, MD 11/29/2021 7:58 AM

## 2021-12-02 ENCOUNTER — Ambulatory Visit: Payer: Medicare HMO | Admitting: Physical Medicine and Rehabilitation

## 2021-12-06 ENCOUNTER — Telehealth: Payer: Self-pay | Admitting: Cardiology

## 2021-12-06 NOTE — Telephone Encounter (Signed)
Pt c/o Shortness Of Breath: STAT if SOB developed within the last 24 hours or pt is noticeably SOB on the phone  1. Are you currently SOB (can you hear that pt is SOB on the phone)?  No   2. How long have you been experiencing SOB?  Past few months   3. Are you SOB when sitting or when up moving around?  When up and moving around   4. Are you currently experiencing any other symptoms?  Palpitations (past few months/NL BP/HR), weakness in legs, unsteadiness

## 2021-12-06 NOTE — Telephone Encounter (Signed)
Not clear why he would be SOB. Was admitted in Dec with urinary infection. Was seen by primary who thought he may have some vertigo. Would need OV to evaluate further - could see me or APP  Lamica Mccart Martinique MD, [facility]

## 2021-12-06 NOTE — Telephone Encounter (Signed)
Pt notified of the need to have an appt with Dr. Martinique or APP. Appt made for 12/09/21 at 0800.

## 2021-12-06 NOTE — Telephone Encounter (Signed)
Pt reports that he has been SOB and lightheaded with walking since hospital visit in December. Pt state that he was a the doctor office and weighed 211 lbs. He does not have a scale and is unable to weight today.  Pt states that when he goes from a sitting to a standing position he gets a pounding in his head and becomes dizzy. Pt reports his BP as 125/58 HR 79. Pt states that he is currently on antibiotics.

## 2021-12-09 ENCOUNTER — Encounter: Payer: Self-pay | Admitting: Physician Assistant

## 2021-12-09 ENCOUNTER — Ambulatory Visit: Payer: Medicare HMO | Admitting: Physician Assistant

## 2021-12-09 ENCOUNTER — Other Ambulatory Visit: Payer: Self-pay

## 2021-12-09 VITALS — BP 124/59 | HR 66 | Resp 20 | Ht 73.0 in | Wt 217.8 lb

## 2021-12-09 DIAGNOSIS — I2510 Atherosclerotic heart disease of native coronary artery without angina pectoris: Secondary | ICD-10-CM

## 2021-12-09 DIAGNOSIS — R3912 Poor urinary stream: Secondary | ICD-10-CM | POA: Diagnosis not present

## 2021-12-09 DIAGNOSIS — E785 Hyperlipidemia, unspecified: Secondary | ICD-10-CM

## 2021-12-09 DIAGNOSIS — I10 Essential (primary) hypertension: Secondary | ICD-10-CM

## 2021-12-09 DIAGNOSIS — N401 Enlarged prostate with lower urinary tract symptoms: Secondary | ICD-10-CM | POA: Diagnosis not present

## 2021-12-09 DIAGNOSIS — R350 Frequency of micturition: Secondary | ICD-10-CM | POA: Diagnosis not present

## 2021-12-09 DIAGNOSIS — N453 Epididymo-orchitis: Secondary | ICD-10-CM | POA: Diagnosis not present

## 2021-12-09 DIAGNOSIS — R0609 Other forms of dyspnea: Secondary | ICD-10-CM

## 2021-12-09 DIAGNOSIS — E1165 Type 2 diabetes mellitus with hyperglycemia: Secondary | ICD-10-CM

## 2021-12-09 NOTE — Progress Notes (Signed)
Office Visit    Patient Name: Robert [name] Date of Encounter: 12/09/2021  PCP:  Vivi Barrack, [place]  Cardiologist:  Peter Martinique, MD  Advanced Practice Provider:  No care team member to display Electrophysiologist:  None    HPI    Robert [name] is a 76 y.o. male with a hx of NSTEMI with DES to proximal RCA in November 2012, hypertension, hyperlipidemia presents today for SOB.   [name] underwent a DES to the proximal RCA November 2012.  EF was normal.  Stress Myoview in March 2015 was normal.  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  He was seen October 2022 and he was doing well.  He had some limitations after surgery but this was getting better.  He shares that he has had a rough last couple of months.  For Thanksgiving he was not feeling well in general which likely started with epididymitis.  In December he was stacking some firewood became severely short of breath, had heart palpitations (he felt his heart beating at the top of his head), dizziness, unsteadiness, and generally not feeling well.  His blood pressure lower at this time and heart rate was over 100 bpm.  He went to the [place] clinic x-ray, exam, COVID, flu, and RSV were all negative.  The next day that he saw his urologist and he was prescribed antibiotics for UTI/prostate infection.  On December 22 he received a phone call that he was white blood cell count was over 18,000 and he was sent to the ER with concern for sepsis.  He then underwent another round of antibiotics.  Today, he is having extreme dyspnea on exertion even when doing basic things.  He used to be a very active 76 year old man but has noticed a substantial difference over the last couple months.  All his recent lab work from this month is normal.  He has some unsteadiness when walking and some positional dizziness. He  also endorses "heart palpitations" which he describes as feeling like his heart is pounding into his head. BP has been normal when its checked at home.   Reports no chest pain, pressure, or tightness. No edema, orthopnea, PND.    Past Medical History    Past Medical History:  Diagnosis Date   Arthritis    CAD (coronary artery disease) 10/10/2011   NSTEMI with DES to the proximal RCA following flow wire evaluation. EF is normal.    GERD (gastroesophageal reflux disease)    H/O esophageal ulcer    History of stomach ulcers    Hyperlipidemia    Hypertension    Migraines    "quite often; maybe q 10d to 2 wk" (01/14/2014)   Myocardial infarction ([place]) 10/2011   OSA on CPAP    "wear it most of the time" (01/14/2014)   Pre-diabetes    Squamous cell cancer of skin of earlobe    "left"   Squamous cell carcinoma 12/17/2016   Past Surgical History:  Procedure Laterality Date   BACK SURGERY     CATARACT EXTRACTION Bilateral 2019   CATARACT EXTRACTION W/ INTRAOCULAR LENS IMPLANT Bilateral 01/2019   CERVICAL DISC SURGERY  1980's?   CORONARY ANGIOPLASTY WITH STENT PLACEMENT  10/13/2011   DES to the proximal RCA. Dr Martinique   FRACTIONAL FLOW RESERVE WIRE Right 10/13/2011   Procedure: FRACTIONAL FLOW  RESERVE WIRE;  Surgeon: Peter M Martinique, MD;  Location[facility] CATH LAB;  Service: Cardiovascular;  Laterality: Right;   HERNIA REPAIR     INGUINAL HERNIA REPAIR  10/23/2012   Procedure: HERNIA REPAIR INGUINAL ADULT;  Surgeon: Odis Hollingshead, MD;  Location: [place];  Service: General;  Laterality: Left;  Left lower quadrant; left inguinal hernia repair with mesh   INSERTION OF MESH  10/23/2012   Procedure: INSERTION OF MESH;  Surgeon: Odis Hollingshead, MD;  Location: [place];  Service: General;  Laterality: Left;  Left lower quadrant   LEFT HEART CATHETERIZATION WITH CORONARY ANGIOGRAM N/A 10/13/2011   Procedure: LEFT HEART CATHETERIZATION WITH CORONARY ANGIOGRAM;  Surgeon: Peter M Martinique, MD;  Location:  [facility] CATH LAB;  Service: Cardiovascular;  Laterality: N/A;   PERCUTANEOUS CORONARY STENT INTERVENTION (PCI-S) Right 10/13/2011   Procedure: PERCUTANEOUS CORONARY STENT INTERVENTION (PCI-S);  Surgeon: Peter M Martinique, MD;  Location: [facility] CATH LAB;  Service: Cardiovascular;  Laterality: Right;   SHOULDER ARTHROSCOPY W/ ROTATOR CUFF REPAIR Left 1990's   TOTAL HIP ARTHROPLASTY Left 05/14/2021   Procedure: LEFT TOTAL HIP ARTHROPLASTY ANTERIOR APPROACH;  Surgeon: Mcarthur Rossetti, MD;  Location: [facility] ORS;  Service: Orthopedics;  Laterality: Left;   TRIGGER FINGER RELEASE Left    3rd and 4th digits    Allergies  Allergies  Allergen Reactions   Sumatriptan Shortness Of Breath    REACTION: sob, headache scalp sensitivity   Penicillins Itching   Lipitor [Atorvastatin] Nausea And Vomiting   Oxycodone Itching   EKGs/Labs/Other Studies Reviewed:   The following studies were reviewed today:  Echocardiogram 11/12 showing normal EF ETT 3/15 showing no concern for ischemic disease   EKG:  EKG is not ordered today. Last EKG reviewed which showed   Recent Labs: 06/09/2021: TSH 1.93 11/06/2021: Magnesium 1.9 11/26/2021: ALT 23; BUN 11; Creatinine, Ser 0.85; Hemoglobin 12.7; Platelets 158.0; Potassium 4.7; Sodium 141  Recent Lipid Panel    Component Value Date/Time   CHOL 128 06/09/2021 0948   CHOL 135 06/09/2020 1054   TRIG 124.0 06/09/2021 0948   TRIG 72 10/18/2006 1119   HDL 54.60 06/09/2021 0948   HDL 55 06/09/2020 1054   CHOLHDL 2 06/09/2021 0948   VLDL 24.8 06/09/2021 0948   LDLCALC 48 06/09/2021 0948   LDLCALC 59 06/09/2020 1054   LDLDIRECT 137.0 03/31/2010 0808     Home Medications   Current Meds  Medication Sig   acetaminophen (TYLENOL) 500 MG tablet Take 1,000 mg by mouth every 6 (six) hours as needed for moderate pain.   Ascorbic Acid (VITAMIN C) 1000 MG tablet Take 1,000 mg by mouth daily.   aspirin 81 MG chewable tablet Chew 1 tablet (81 mg total) by mouth 2 (two) times  daily. (Patient taking differently: Chew 81 mg by mouth daily.)   azelastine (ASTELIN) 0.1 % nasal spray Place 1 spray into both nostrils daily as needed for rhinitis.   B Complex-C (SUPER B COMPLEX PO) Take 1 tablet by mouth daily.   brimonidine (ALPHAGAN) 0.2 % ophthalmic solution Place 1 drop into the right eye in the morning and at bedtime.   EMGALITY 120 MG/ML SOAJ INJECT 120 MG INTO THE SKIN EVERY 30 (THIRTY) DAYS.   fluticasone (FLONASE) 50 MCG/ACT nasal spray Place 1 spray into both nostrils daily as needed for allergies.   latanoprost (XALATAN) 0.005 % ophthalmic solution Place 1 drop into the right eye at bedtime.   levofloxacin (LEVAQUIN) 500 MG tablet Take 1 tablet (500  mg total) by mouth daily for 14 days.   metoprolol tartrate (LOPRESSOR) 25 MG tablet Take 12.5 mg by mouth daily.   Multiple Vitamins-Minerals (MULTIVITAMINS THER. W/MINERALS) TABS Take 1 tablet by mouth daily.   NURTEC 75 MG TBDP TAKE 75 MG BY MOUTH DAILY AS NEEDED. FOR MIGRAINES. TAKE AS CLOSE TO ONSET OF MIGRAINE AS POSSIBLE. ONE DAILY MAXIMUM. (Patient taking differently: Take 75 mg by mouth daily as needed (migraine).)   pantoprazole (PROTONIX) 40 MG tablet Take 40 mg by mouth daily.   rosuvastatin (CRESTOR) 40 MG tablet Take 40 mg by mouth daily.   tamsulosin (FLOMAX) 0.4 MG CAPS capsule Take 1 capsule (0.4 mg total) by mouth daily. (Patient taking differently: Take 0.4 mg by mouth daily. Patient taking 2 tablets daily)   traMADol (ULTRAM) 50 MG tablet Take 50 mg by mouth daily as needed for moderate pain.   vitamin E 180 MG (400 UNITS) capsule Take 400 Units by mouth daily.   zinc gluconate 50 MG tablet Take 50 mg by mouth daily.     Review of Systems      All other systems reviewed and are otherwise negative except as noted above.  Physical Exam    VS:  BP (!) 124/59 (BP Location: Left Arm, Patient Position: Sitting, Cuff Size: Normal)    Pulse 66    Resp 20    Ht 6\' 1"  (1.854 m)    Wt 217 lb 12.8 oz (98.8  kg)    SpO2 96%    BMI 28.74 kg/m  , BMI Body mass index is 28.74 kg/m.  Wt Readings from Last 3 Encounters:  12/09/21 217 lb 12.8 oz (98.8 kg)  11/26/21 212 lb (96.2 kg)  11/12/21 208 lb 3.2 oz (94.4 kg)     GEN: Well nourished, well developed, in no acute distress. HEENT: normal. Neck: Supple, no JVD, carotid bruits, or masses. Cardiac: RRR, no murmurs, rubs, or gallops. No clubbing, cyanosis, edema.  Radials/PT 2+ and equal bilaterally.  Respiratory:  Respirations regular and unlabored, clear to auscultation bilaterally. GI: Soft, nontender, nondistended. MS: No deformity or atrophy. Skin: Warm and dry, no rash. Neuro:  Strength and sensation are intact. Psych: Normal affect.  Assessment & Plan    CAD status post drug-eluting stent to the proximal RCA in November 2012 -No recent ischemic work-up -No recent chest pain but does have DOE  Hyperlipidemia -Recent lipid panel 7/22 shows total cholesterol 128, LDL 48, HDL 54, triglycerides 124 -Would repeat 05/2022  Hypertension -Well controlled today in the clinic -Encouraged to continue taking his BP at home -Encouraged to maintain hydration for potential orthostatic hypotension based on symptoms  Diabetes mellitus type 2 A1c 7.3% -Would defer to PCP for better control  5. New onset DOE -New as of Nov 2022, progressively worse even with resolved infection -He also has some associated dizziness and feels like his chest is pounding -Will order an Echocardiogram today -Possible need for further ischemic work-up based on results     Disposition: Follow up 1 month with Peter Martinique, MD or APP.  Signed, [name], PA-C 12/09/2021, 8:48 AM [place]

## 2021-12-09 NOTE — Patient Instructions (Addendum)
Medication Instructions:  Your physician recommends that you continue on your current medications as directed. Please refer to the Current Medication list given to you today.  *If you need a refill on your cardiac medications before your next appointment, please call your pharmacy*  Lab Work: NONE ordered at this time of appointment   If you have labs (blood work) drawn today and your tests are completely normal, you will receive your results only by: [place] (if you have MyChart) OR A paper copy in the mail If you have any lab test that is abnormal or we need to change your treatment, we will call you to review the results.  Testing/Procedures: Your physician has requested that you have an echocardiogram. Echocardiography is a painless test that uses sound waves to create images of your heart. It provides your doctor with information about the size and shape of your heart and how well your hearts chambers and valves are working. This procedure takes approximately one hour. There are no restrictions for this procedure.  Please schedule for 3-4 weeks   Follow-Up: At [facility], you and your health needs are our priority.  As part of our continuing mission to provide you with exceptional heart care, we have created designated Provider Care Teams.  These Care Teams include your primary Cardiologist (physician) and Advanced Practice Providers (APPs -  Physician Assistants and Nurse Practitioners) who all work together to provide you with the care you need, when you need it.  Your next appointment:   1 month(s)  The format for your next appointment:   In Person  Provider:   Peter Martinique, MD  or  APP        Other Instructions

## 2021-12-16 ENCOUNTER — Other Ambulatory Visit: Payer: Self-pay

## 2021-12-16 ENCOUNTER — Ambulatory Visit (HOSPITAL_COMMUNITY): Payer: Medicare HMO | Attending: Cardiology

## 2021-12-16 DIAGNOSIS — R0609 Other forms of dyspnea: Secondary | ICD-10-CM | POA: Diagnosis not present

## 2021-12-16 LAB — ECHOCARDIOGRAM COMPLETE
Area-P 1/2: 2.95 cm2
S' Lateral: 3.4 cm

## 2021-12-21 DIAGNOSIS — N453 Epididymo-orchitis: Secondary | ICD-10-CM | POA: Diagnosis not present

## 2021-12-21 DIAGNOSIS — R350 Frequency of micturition: Secondary | ICD-10-CM | POA: Diagnosis not present

## 2021-12-21 DIAGNOSIS — N401 Enlarged prostate with lower urinary tract symptoms: Secondary | ICD-10-CM | POA: Diagnosis not present

## 2021-12-23 ENCOUNTER — Other Ambulatory Visit: Payer: Self-pay | Admitting: Urology

## 2021-12-23 DIAGNOSIS — N453 Epididymo-orchitis: Secondary | ICD-10-CM

## 2021-12-29 ENCOUNTER — Ambulatory Visit (INDEPENDENT_AMBULATORY_CARE_PROVIDER_SITE_OTHER): Payer: Medicare HMO | Admitting: Neurology

## 2021-12-29 ENCOUNTER — Encounter: Payer: Self-pay | Admitting: Neurology

## 2021-12-29 ENCOUNTER — Telehealth: Payer: Self-pay | Admitting: Neurology

## 2021-12-29 DIAGNOSIS — G43711 Chronic migraine without aura, intractable, with status migrainosus: Secondary | ICD-10-CM

## 2021-12-29 NOTE — Patient Instructions (Signed)
Continue Emgality and nurtec F/u 3 months

## 2021-12-29 NOTE — Telephone Encounter (Signed)
Called and spoke with pt, video visit scheduled for 03/29/22 at 9:30 am.

## 2021-12-29 NOTE — Telephone Encounter (Signed)
Please call patient and schedule a video(preferred video) follow up (but telephone is fine too) in 3 months with dr Jaynee Eagles.

## 2021-12-29 NOTE — Progress Notes (Signed)
[facility]    Provider:  Dr Jaynee Eagles Requesting Provider: Vivi Barrack, MD Primary Care Provider:  Vivi Barrack, MD  CC:  migraines  Virtual Visit via Telephone Note  I connected with Robert [name] on 12/29/21 at  9:00 AM EST by telephone and verified that I am speaking with the correct person using two identifiers.  Location: Patient: home Provider: office   I discussed the limitations, risks, security and privacy concerns of performing an evaluation and management service by telephone and the availability of in person appointments. I also discussed with the patient that there may be a patient responsible charge related to this service. The patient expressed understanding and agreed to proceed.   Follow Up Instructions:    I discussed the assessment and treatment plan with the patient. The patient was provided an opportunity to ask questions and all were answered. The patient agreed with the plan and demonstrated an understanding of the instructions.   The patient was advised to call back or seek an in-person evaluation if the symptoms worsen or if the condition fails to improve as anticipated.  I provided over 21 minutes of non-face-to-face time during this encounter (this includes image review of CT head)   [name], MD   12/29/2021 In December he was admitted for urosepsis, he was there on IV antibiotics, he is on oral antibiotics and still not feeling well. He was having headaches but likely due to the  infection. He is getting the botox at the [place], he does think the [employer] (insurance did not approve Ajovy) is helping quite a bit but we should check back in a few months due to all the problems he had had recently to get a better baseline.  He is getting the Emgality from the pharmacy. Still he feels the migraines are improved on Emgality in addition to the botox. I also spoke to his wife.  Patient complains of symptoms per HPI as well as the  following symptoms: urosepsis . Pertinent negatives and positives per HPI. All others negative   HPI:  Robert [name] is a 76 y.o. male here as requested by Vivi Barrack, MD for migraines. He is on botox already and gets it completed every 9 weeks (I do not see any records on epic or "Care Everywhere" may be at the [place]).  He has been to pain management for cervical radiculopathy and chronic neck pain in the past and currently sees Dr. Ernestina Patches for low back pain and injections for pain management.  He has also seen other neurologists such as Dr. Tomi Likens at [facility] neurology in 2016 and [place].  He has a past medical history of coronary artery disease, chronic intractable migraines, hyperlipidemia, hypertension, MI, OSA on CPAP, prediabetes, chronic dry eyes.  I reviewed Dr. 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  He has had migraines since the age of 20-19 lasted about 3 days went on years every 3 months. They have come closer and closer and now the he gets botox every 9 weeks. It helps reduce the pain by at least 50% but he has daily migraines.  He uses his cpap every night. He has  migraines every day can last all day, no known trigers, not upon waking, tramadol helps a little, unknwon triggers. The whole top of the head hurts, his eyes hurt, pounding/pulsating/throbbing, light sensitivity, sound nausea, no vomiting, some days are worse than others.They can be severe. He takes daily tramadol (discussed this can make it worse). He has been to the [facility], goes to neurology at the [facility], saw Dr. Loretta Plume neurologist. Triptans contraindicated due to stroke. We had  along talk about  options today.Wife here and provides much information. No other focal neurologic deficits, associated symptoms, inciting events or modifiable factors.  Reviewed notes, labs and imaging from outside physicians, which showed:    CT head 10/2021: showed No acute intracranial abnormalities including mass lesion or mass effect, hydrocephalus, extra-axial fluid collection, midline shift, hemorrhage, or acute infarction, large ischemic events (personally reviewed images)    Mri brain/orbits 2020: IMPRESSION:reviewed images and agree (reviewed with patient and his wife as well.) 1. No acute intracranial abnormality. 2. Mild-to-moderate chronic small vessel ischemic disease and small chronic left occipital infarct. 3. Negative orbital imaging.    From a thorough review of records, patient has tried the following medications that can be used in migraine management including Tylenol, aspirin, Flexeril, Plavix, Flexeril, Decadron injections, Valium, Aimovig, gabapentin, hydromorphone, ibuprofen, ketorolac injections, Robaxin, metoprolol, Zofran, oxycodone, tramadol, tizanidine, verapamil, topamax, amitriptyline.   Jhba1c 7.3 06/09/2021, tsh normal, cbc/cmp unremarkable  Review of Systems: Patient complains of symptoms per HPI as well as the following symptoms: sepsis admitted to the hospital . Pertinent negatives and positives per HPI. All others negative    Social History   Socioeconomic History   Marital status: Married    Spouse name: Not on file   Number of children: Not on file   Years of education: Not on file   Highest education level: 12th grade  Occupational History    Comment: Retired -[employer]  Tobacco Use   Smoking status: Former    Packs/day: 2.00    Years: 20.00    Pack years: 40.00    Types: Cigarettes    Quit date: 11/14/1982    Years since quitting: 39.1   Smokeless tobacco: Never  Vaping Use   Vaping Use: Never used  Substance and Sexual Activity   Alcohol use: Not  Currently    Comment: occ   Drug use: No   Sexual activity: Not on file  Other Topics Concern   Not on file  Social History Narrative   Pt lives with his wife. 1 and a half story home  No issues with stairs. 12th grade education   Social Determinants of Health   Financial Resource Strain: Low Risk    Difficulty of Paying Living Expenses: Not hard at all  Food Insecurity: No Food Insecurity   Worried About Charity fundraiser in the Last Year: Never true   Ran Out of Food in the Last Year: Never true  Transportation Needs: No Transportation Needs   Lack of Transportation (Medical): No   Lack of Transportation (Non-Medical): No  Physical Activity: Inactive   Days of Exercise per Week: 0 days   Minutes of Exercise per Session: 0 min  Stress: No Stress Concern Present   Feeling of Stress : Not at all  Social Connections: Moderately Integrated   Frequency of Communication with Friends and Family: More than three times a week   Frequency of Social Gatherings with Friends and Family: More than three times a week   Attends  Religious Services: Never   Active Member of Clubs or Organizations: Yes   Attends Archivist Meetings: 1 to 4 times per year   Marital Status: Married  Human resources officer Violence: Not At Risk   Fear of Current or Ex-Partner: No   Emotionally Abused: No   Physically Abused: No   Sexually Abused: No    Family History  Problem Relation Age of Onset   Cancer Mother        lung   Heart disease Father        heart attack    Cancer Father        colon   Migraines Neg Hx    Headache Neg Hx     Past Medical History:  Diagnosis Date   Arthritis    CAD (coronary artery disease) 10/10/2011   NSTEMI with DES to the proximal RCA following flow wire evaluation. EF is normal.    GERD (gastroesophageal reflux disease)    H/O esophageal ulcer    History of stomach ulcers    Hyperlipidemia    Hypertension    Migraines    "quite often; maybe q 10d to 2  wk" (01/14/2014)   Myocardial infarction ([place]) 10/2011   OSA on CPAP    "wear it most of the time" (01/14/2014)   Pre-diabetes    Squamous cell cancer of skin of earlobe    "left"   Squamous cell carcinoma 12/17/2016    Patient Active Problem List   Diagnosis Date Noted   Chronic migraine without aura, with intractable migraine, so stated, with status migrainosus 09/20/2021   Status post total replacement of left hip 05/14/2021   Type 2 diabetes mellitus with hyperglycemia ([place]) 05/23/2018   Chronically dry eyes, bilateral 05/23/2018   Migraines 11/28/2016   Hypertension associated with diabetes ([place]) 10/11/2011   History of basal cell carcinoma ([place]), right ear 03/03/2009   Hyperlipidemia associated with type 2 diabetes mellitus ([place]) 01/11/2008   Allergic rhinitis 01/07/2008   Prostatitis, recurrent 01/07/2008   History of gastric ulcer 01/07/2008   History of colon polyps, followed by Dr. Earlean Shawl 01/07/2008   GERD 04/02/2007   Sleep apnea 04/02/2007    Past Surgical History:  Procedure Laterality Date   BACK SURGERY     CATARACT EXTRACTION Bilateral 2019   CATARACT EXTRACTION W/ INTRAOCULAR LENS IMPLANT Bilateral 01/2019   CERVICAL DISC SURGERY  1980's?   CORONARY ANGIOPLASTY WITH STENT PLACEMENT  10/13/2011   DES to the proximal RCA. Dr Martinique   FRACTIONAL FLOW RESERVE WIRE Right 10/13/2011   Procedure: [place];  Surgeon: Peter M Martinique, MD;  Location: [facility] CATH LAB;  Service: Cardiovascular;  Laterality: Right;   HERNIA REPAIR     INGUINAL HERNIA REPAIR  10/23/2012   Procedure: HERNIA REPAIR INGUINAL ADULT;  Surgeon: Odis Hollingshead, MD;  Location: [facility];  Service: General;  Laterality: Left;  Left lower quadrant; left inguinal hernia repair with mesh   INSERTION OF MESH  10/23/2012   Procedure: INSERTION OF MESH;  Surgeon: Odis Hollingshead, MD;  Location: [facility];  Service: General;  Laterality: Left;  Left lower quadrant   LEFT HEART CATHETERIZATION  WITH CORONARY ANGIOGRAM N/A 10/13/2011   Procedure: LEFT HEART CATHETERIZATION WITH CORONARY ANGIOGRAM;  Surgeon: Peter M Martinique, MD;  Location: [facility] CATH LAB;  Service: Cardiovascular;  Laterality: N/A;   PERCUTANEOUS CORONARY STENT INTERVENTION (PCI-S) Right 10/13/2011   Procedure: PERCUTANEOUS CORONARY STENT INTERVENTION (PCI-S);  Surgeon: Peter M Martinique, MD;  Location: [facility] CATH LAB;  Service: Cardiovascular;  Laterality: Right;   SHOULDER ARTHROSCOPY W/ ROTATOR CUFF REPAIR Left 1990's   TOTAL HIP ARTHROPLASTY Left 05/14/2021   Procedure: LEFT TOTAL HIP ARTHROPLASTY ANTERIOR APPROACH;  Surgeon: Mcarthur Rossetti, MD;  Location: [facility];  Service: Orthopedics;  Laterality: Left;   TRIGGER FINGER RELEASE Left    3rd and 4th digits    Current Outpatient Medications  Medication Sig Dispense Refill   acetaminophen (TYLENOL) 500 MG tablet Take 1,000 mg by mouth every 6 (six) hours as needed for moderate pain.     Ascorbic Acid (VITAMIN C) 1000 MG tablet Take 1,000 mg by mouth daily.     aspirin 81 MG chewable tablet Chew 1 tablet (81 mg total) by mouth 2 (two) times daily. (Patient taking differently: Chew 81 mg by mouth daily.) 35 tablet 0   azelastine (ASTELIN) 0.1 % nasal spray Place 1 spray into both nostrils daily as needed for rhinitis.     B Complex-C (SUPER B COMPLEX PO) Take 1 tablet by mouth daily.     brimonidine (ALPHAGAN) 0.2 % ophthalmic solution Place 1 drop into the right eye in the morning and at bedtime.     EMGALITY 120 MG/ML SOAJ INJECT 120 MG INTO THE SKIN EVERY 30 (THIRTY) DAYS. 1 mL 11   fluticasone (FLONASE) 50 MCG/ACT nasal spray Place 1 spray into both nostrils daily as needed for allergies.     latanoprost (XALATAN) 0.005 % ophthalmic solution Place 1 drop into the right eye at bedtime.     metoprolol tartrate (LOPRESSOR) 25 MG tablet Take 12.5 mg by mouth daily.     Multiple Vitamins-Minerals (MULTIVITAMINS THER. W/MINERALS) TABS Take 1 tablet by mouth daily.      nitroGLYCERIN (NITROSTAT) 0.4 MG SL tablet Place 1 tablet (0.4 mg total) under the tongue every 5 (five) minutes as needed for chest pain. Do not exceed more than 3 doses per day. 25 tablet 11   NURTEC 75 MG TBDP TAKE 75 MG BY MOUTH DAILY AS NEEDED. FOR MIGRAINES. TAKE AS CLOSE TO ONSET OF MIGRAINE AS POSSIBLE. ONE DAILY MAXIMUM. (Patient taking differently: Take 75 mg by mouth daily as needed (migraine).) 10 tablet 6   pantoprazole (PROTONIX) 40 MG tablet Take 40 mg by mouth daily.     rosuvastatin (CRESTOR) 40 MG tablet Take 40 mg by mouth daily.     tamsulosin (FLOMAX) 0.4 MG CAPS capsule Take 1 capsule (0.4 mg total) by mouth daily. (Patient taking differently: Take 0.4 mg by mouth daily. Patient taking 2 tablets daily) 30 capsule 1   traMADol (ULTRAM) 50 MG tablet Take 50 mg by mouth daily as needed for moderate pain.     vitamin E 180 MG (400 UNITS) capsule Take 400 Units by mouth daily.     zinc gluconate 50 MG tablet Take 50 mg by mouth daily.     No current facility-administered medications for this visit.    Allergies as of 12/29/2021 - Review Complete 12/09/2021  Allergen Reaction Noted   Sumatriptan Shortness Of Breath 01/07/2008   Penicillins Itching    Lipitor [atorvastatin] Nausea And Vomiting 12/09/2021   Oxycodone Itching 11/21/2012    Vitals: There were no vitals taken for this visit. Last Weight:  Wt Readings from Last 1 Encounters:  12/09/21 217 lb 12.8 oz (98.8 kg)   Last Height:   Ht Readings from Last 1 Encounters:  12/09/21 6\' 1"  (1.854 m)    Speech:    Speech  is normal; fluent and spontaneous with normal comprehension.  Cognition:    The patient is oriented to person, place, and time;     recent and remote memory intact;     language fluent;     normal attention, concentration,     fund of knowledge   Assessment/Plan:  76 y.o. male here as requested by Vivi Barrack, MD for migraines. He is on botox already and gets it completed every 9 weeks (I do  not see any records on epic or "Care Everywhere" may be at the [place]).  He has been to pain management for cervical radiculopathy and chronic neck pain in the past and currently sees Dr. Ernestina Patches for low back pain and injections for pain management.  He has also seen other neurologists such as Dr. Tomi Likens at [facility] neurology in 2016 and [place].  He has a past medical history of coronary artery disease, chronic intractable migraines, hyperlipidemia, hypertension, MI, OSA on CPAP, prediabetes, chronic dry eyes.  I reviewed Dr. 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 He was having headaches but likely due to the  infection. He is getting the botox at the [place], he does think the [employer] (insurance did not approve Ajovy) is helping quite a bit but we should check back in a few months due to all the problems he had had recently to get a better baseline.  He is getting the Emgality from the pharmacy. Still he feels the migraines are improved on Emgality in addition to the botox. I also spoke to his wife  - Triptans contraindicated, encephalomalacia in the left occipital lobe, unclear if old stroke. Nurtec.   Cc: Vivi Barrack, MD,  Vivi Barrack, MD  Sarina Ill, MD  [facility] [address] [place], [place] [address]  Phone [phone] Fax [phone]

## 2022-01-10 ENCOUNTER — Ambulatory Visit
Admission: RE | Admit: 2022-01-10 | Discharge: 2022-01-10 | Disposition: A | Discharge status: Home / Self Care | Payer: Medicare HMO | Source: Ambulatory Visit | Attending: Urology | Admitting: Urology

## 2022-01-10 ENCOUNTER — Other Ambulatory Visit: Payer: Medicare HMO

## 2022-01-10 DIAGNOSIS — N503 Cyst of epididymis: Secondary | ICD-10-CM | POA: Diagnosis not present

## 2022-01-10 DIAGNOSIS — N453 Epididymo-orchitis: Secondary | ICD-10-CM

## 2022-01-10 DIAGNOSIS — N433 Hydrocele, unspecified: Secondary | ICD-10-CM | POA: Diagnosis not present

## 2022-01-10 DIAGNOSIS — I861 Scrotal varices: Secondary | ICD-10-CM | POA: Diagnosis not present

## 2022-01-11 ENCOUNTER — Other Ambulatory Visit: Payer: Medicare HMO

## 2022-01-13 DIAGNOSIS — N401 Enlarged prostate with lower urinary tract symptoms: Secondary | ICD-10-CM | POA: Diagnosis not present

## 2022-01-13 DIAGNOSIS — N453 Epididymo-orchitis: Secondary | ICD-10-CM | POA: Diagnosis not present

## 2022-01-13 DIAGNOSIS — R3912 Poor urinary stream: Secondary | ICD-10-CM | POA: Diagnosis not present

## 2022-01-13 NOTE — Progress Notes (Signed)
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  Past Medical History    Past Medical History:  Diagnosis Date   Arthritis    CAD (coronary artery disease) 10/10/2011   NSTEMI with DES to the proximal RCA following flow wire evaluation. EF is normal.    GERD (gastroesophageal reflux disease)    H/O esophageal ulcer    History of stomach ulcers    Hyperlipidemia    Hypertension    Migraines    "quite often; maybe q 10d to 2 wk" (01/14/2014)   Myocardial infarction ([place]) 10/2011   OSA on CPAP    "wear it most of the time" (01/14/2014)   Pre-diabetes    Squamous cell cancer of skin of earlobe    "left"   Squamous cell carcinoma 12/17/2016   Past Surgical History:  Procedure Laterality Date   BACK SURGERY     CATARACT EXTRACTION Bilateral 2019   CATARACT EXTRACTION W/ INTRAOCULAR LENS IMPLANT Bilateral 01/2019   CERVICAL DISC SURGERY  1980's?   CORONARY ANGIOPLASTY WITH STENT PLACEMENT  10/13/2011   DES to the proximal RCA. Dr Martinique   FRACTIONAL FLOW RESERVE WIRE Right 10/13/2011   Procedure: Schneider;  Surgeon: Peter M Martinique, MD;  Location: [facility] CATH LAB;  Service: Cardiovascular;  Laterality: Right;   HERNIA REPAIR     INGUINAL HERNIA REPAIR  10/23/2012   Procedure: HERNIA REPAIR INGUINAL ADULT;  Surgeon: Odis Hollingshead, MD;  Location: [place];  Service: General;  Laterality: Left;  Left lower quadrant; left inguinal hernia repair with mesh   INSERTION OF MESH  10/23/2012   Procedure: INSERTION OF MESH;  Surgeon: Odis Hollingshead, MD;  Location: [facility];  Service: General;  Laterality: Left;  Left lower quadrant   LEFT HEART CATHETERIZATION WITH CORONARY ANGIOGRAM N/A 10/13/2011   Procedure: LEFT HEART CATHETERIZATION WITH CORONARY ANGIOGRAM;  Surgeon: Peter M Martinique, MD;  Location[facility] CATH LAB;  Service: Cardiovascular;  Laterality: N/A;   PERCUTANEOUS CORONARY STENT INTERVENTION (PCI-S) Right 10/13/2011   Procedure: PERCUTANEOUS CORONARY STENT INTERVENTION (PCI-S);  Surgeon: Peter M Martinique, MD;  Location: [facility] CATH LAB;  Service: Cardiovascular;  Laterality: Right;   SHOULDER ARTHROSCOPY W/ ROTATOR CUFF REPAIR Left 1990's   TOTAL HIP ARTHROPLASTY Left 05/14/2021   Procedure: LEFT TOTAL HIP ARTHROPLASTY ANTERIOR APPROACH;  Surgeon: Mcarthur Rossetti, MD;  Location: [facility];  Service: Orthopedics;  Laterality: Left;   TRIGGER FINGER RELEASE Left    3rd and 4th digits    Allergies  Allergies  Allergen Reactions   Sumatriptan Shortness Of Breath    REACTION: sob, headache scalp sensitivity   Penicillins Itching   Lipitor [Atorvastatin] Nausea And Vomiting   Oxycodone Itching    History of Present Illness    [name] is a pleasant 76 year old male patient we are following for ongoing assessment and management of coronary artery disease status post NSTEMI in 2014 requiring DES to the proximal RCA, follow-up stress Myoview in March 2015 which was normal, we are also following hypertension, hyperlipidemia.  He called our  office on 12/06/2021 with complaints of shortness of breath.  Known history of OSA on CPAP.  Recent hospitalization December 2022 for urinary retention.  Treated for prostatitis, epididymitis, and urinary sepsis.  Was seen last in the office on 12/09/2021 by Shaaron Adler, PA for further assessment of dyspnea and heart palpitations.  Repeat echocardiogram was ordered with consideration of further ischemic work-up based on the results.  Echocardiogram dated 12/16/2021 demonstrated normal heart  function with some aortic valve calcification but no narrowing or leaking of the valve.  He comes today without any cardiac complaints.  He has recovered well from sepsis which had him hospitalized in December 2022.  He is try to get back to his normal activities as his energy level has improved.  He has questions about his recent echocardiogram.  He is medically compliant.  He enjoys playing golf but has not had a chance to do so because of weather and lack of energy, but he is feeling much better and is anxious to return to the course.  Home Medications    Current Outpatient Medications  Medication Sig Dispense Refill   acetaminophen (TYLENOL) 500 MG tablet Take 1,000 mg by mouth every 6 (six) hours as needed for moderate pain.     Ascorbic Acid (VITAMIN C) 1000 MG tablet Take 1,000 mg by mouth daily.     aspirin 81 MG chewable tablet Chew 1 tablet (81 mg total) by mouth 2 (two) times daily. (Patient taking differently: Chew 81 mg by mouth daily.) 35 tablet 0   azelastine (ASTELIN) 0.1 % nasal spray Place 1 spray into both nostrils daily as needed for rhinitis.     B Complex-C (SUPER B COMPLEX PO) Take 1 tablet by mouth daily.     brimonidine (ALPHAGAN) 0.2 % ophthalmic solution Place 1 drop into the right eye in the morning and at bedtime.     EMGALITY 120 MG/ML SOAJ INJECT 120 MG INTO THE SKIN EVERY 30 (THIRTY) DAYS. 1 mL 11   ezetimibe (ZETIA) 10 MG tablet Take 0.5 tablets by mouth daily.     fluticasone (FLONASE) 50 MCG/ACT nasal spray Place 1 spray into both nostrils daily as needed for allergies.     latanoprost (XALATAN) 0.005 % ophthalmic solution Place 1 drop into the right eye at bedtime.     metoprolol tartrate (LOPRESSOR) 25 MG tablet Take 12.5 mg by mouth daily.     Multiple Vitamins-Minerals (MULTIVITAMINS THER. W/MINERALS) TABS Take 1 tablet by mouth daily.     NURTEC 75 MG TBDP TAKE 75 MG BY MOUTH DAILY AS NEEDED. FOR MIGRAINES. TAKE AS CLOSE TO ONSET OF MIGRAINE AS POSSIBLE.  ONE DAILY MAXIMUM. (Patient taking differently: Take 75 mg by mouth daily as needed (migraine).) 10 tablet 6   pantoprazole (PROTONIX) 40 MG tablet Take 40 mg by mouth daily.     rosuvastatin (CRESTOR) 40 MG tablet Take 40 mg by mouth daily.     tamsulosin (FLOMAX) 0.4 MG CAPS capsule Take 1 capsule (0.4 mg total) by mouth daily. (Patient taking differently: Take 0.4 mg by mouth daily. Patient taking 2 tablets daily) 30 capsule 1   traMADol (ULTRAM) 50 MG tablet Take 50 mg by mouth daily as needed for moderate pain.     vitamin E 180 MG (400 UNITS) capsule Take 400 Units by mouth daily.     zinc gluconate 50 MG tablet Take 50 mg by mouth daily.     nitroGLYCERIN (NITROSTAT) 0.4 MG SL tablet Place 1 tablet (0.4  mg total) under the tongue every 5 (five) minutes as needed for chest pain. Do not exceed more than 3 doses per day. 25 tablet 11   No current facility-administered medications for this visit.     Family History    Family History  Problem Relation Age of Onset   Cancer Mother        lung   Heart disease Father        heart attack    Cancer Father        colon   Migraines Neg Hx    Headache Neg Hx    He indicated that his mother is deceased. He indicated that his father is deceased. He indicated that his maternal grandmother is deceased. He indicated that his maternal grandfather is deceased. He indicated that his paternal grandmother is deceased. He indicated that his paternal grandfather is deceased. He indicated that the status of his neg hx is unknown.  Social History    Social History   Socioeconomic History   Marital status: Married    Spouse name: Not on file   Number of children: Not on file   Years of education: Not on file   Highest education level: 12th grade  Occupational History    Comment: Retired -[employer]  Tobacco Use   Smoking status: Former    Packs/day: 2.00    Years: 20.00    Pack years: 40.00    Types: Cigarettes    Quit date: 11/14/1982    Years  since quitting: 39.1   Smokeless tobacco: Never  Vaping Use   Vaping Use: Never used  Substance and Sexual Activity   Alcohol use: Not Currently    Comment: occ   Drug use: No   Sexual activity: Not on file  Other Topics Concern   Not on file  Social History Narrative   Pt lives with his wife. 1 and a half story home  No issues with stairs. 12th grade education   Social Determinants of Health   Financial Resource Strain: Low Risk    Difficulty of Paying Living Expenses: Not hard at all  Food Insecurity: No Food Insecurity   Worried About Charity fundraiser in the Last Year: Never true   Ran Out of Food in the Last Year: Never true  Transportation Needs: No Transportation Needs   Lack of Transportation (Medical): No   Lack of Transportation (Non-Medical): No  Physical Activity: Inactive   Days of Exercise per Week: 0 days   Minutes of Exercise per Session: 0 min  Stress: No Stress Concern Present   Feeling of Stress : Not at all  Social Connections: Moderately Integrated   Frequency of Communication with Friends and Family: More than three times a week   Frequency of Social Gatherings with Friends and Family: More than three times a week   Attends Religious Services: Never   Marine scientist or Organizations: Yes   Attends Music therapist: 1 to 4 times per year   Marital Status: Married  Human resources officer Violence: Not At Risk   Fear of Current or Ex-Partner: No   Emotionally Abused: No   Physically Abused: No   Sexually Abused: No     Review of Systems    General:  No chills, fever, night sweats or weight changes.  Cardiovascular:  No chest pain, dyspnea on exertion, edema, orthopnea, palpitations, paroxysmal nocturnal dyspnea. Dermatological: No rash, lesions/masses Respiratory: No cough, dyspnea Urologic: No hematuria, dysuria Abdominal:  No nausea, vomiting, diarrhea, bright red blood per rectum, melena, or hematemesis Neurologic:  No visual  changes, wkns, changes in mental status. All other systems reviewed and are otherwise negative except as noted above.    Physical Exam    VS:  BP 118/60 (BP Location: Left Arm, Patient Position: Sitting, Cuff Size: Normal)    Pulse 76    Resp 20    Ht 6\' 1"  (1.854 m)    Wt 220 lb 12.8 oz (100.2 kg)    SpO2 96%    BMI 29.13 kg/m  , BMI Body mass index is 29.13 kg/m.     GEN: Well nourished, well developed, in no acute distress. HEENT: normal. Neck: Supple, no JVD, carotid bruits, or masses. Cardiac: RRR, soft systolic murmurs heard best over the right sternal border, rubs, or gallops. No clubbing, cyanosis, edema.  Radials/DP/PT 2+ and equal bilaterally.  Respiratory:  Respirations regular and unlabored, clear to auscultation bilaterally. GI: Soft, nontender, nondistended, BS + x 4. MS: no deformity or atrophy. Skin: warm and dry, no rash. Neuro:  Strength and sensation are intact. Psych: Normal affect.  Accessory Clinical Findings      Lab Results  Component Value Date   WBC 5.4 11/26/2021   HGB 12.7 (L) 11/26/2021   HCT 39.3 11/26/2021   MCV 93.0 11/26/2021   PLT 158.0 11/26/2021   Lab Results  Component Value Date   CREATININE 0.85 11/26/2021   BUN 11 11/26/2021   NA 141 11/26/2021   K 4.7 11/26/2021   CL 102 11/26/2021   CO2 30 11/26/2021   Lab Results  Component Value Date   ALT 23 11/26/2021   AST 20 11/26/2021   ALKPHOS 69 11/26/2021   BILITOT 0.5 11/26/2021   Lab Results  Component Value Date   CHOL 128 06/09/2021   HDL 54.60 06/09/2021   LDLCALC 48 06/09/2021   LDLDIRECT 137.0 03/31/2010   TRIG 124.0 06/09/2021   CHOLHDL 2 06/09/2021    Lab Results  Component Value Date   HGBA1C 7.9 (A) 11/12/2021    Review of Prior Studies:  Echocardiogram 12/16/2021  1. Left ventricular ejection fraction, by estimation, is 60 to 65%. Left  ventricular ejection fraction by 3D volume is 65 %. The left ventricle has  normal function. The left ventricle has no  regional wall motion  abnormalities. There is mild concentric  left ventricular hypertrophy. Left ventricular diastolic parameters were  normal.   2. Right ventricular systolic function is normal. The right ventricular  size is normal. Tricuspid regurgitation signal is inadequate for assessing  PA pressure.   3. The mitral valve is normal in structure. Trivial mitral valve  regurgitation. No evidence of mitral stenosis.   4. The aortic valve is tricuspid. Aortic valve regurgitation is not  visualized. Aortic valve sclerosis/calcification is present, without any  evidence of aortic stenosis.   5. The inferior vena cava is normal in size with greater than 50%  respiratory variability, suggesting right atrial pressure of 3 mmHg.   Assessment & Plan   1.  Hypertension: Well-controlled currently on medication regimen.  I did review his echocardiogram with him and he does have some very mild LVH which I explained to him.  Otherwise echo was essentially normal.  Continue his current medication regimen as he is doing so well on this.  Recommended he increase his activity, encouraged him to return to the golf course as he enjoys it so much.  2.  Hyperlipidemia: Remains on rosuvastatin  40 mg daily.  Labs are followed by primary care physician.  He offers no complaints of myalgia pain.  Goal of LDL less than 100.  3.  History of OSA, on CPAP.  Current medicines are reviewed at length with the patient today.  I have spent 25 min's  dedicated to the care of this patient on the date of this encounter to include pre-visit review of records, assessment, management and diagnostic testing,with shared decision making.   Signed, Phill Myron. West Pugh, ANP, AACC   01/14/2022 9:10 AM    [address] Office [phone] Fax [phone]  Notice: This dictation was prepared with Dragon dictation along with smaller phrase technology. Any transcriptional  errors that result from this process are unintentional and may not be corrected upon review.

## 2022-01-14 ENCOUNTER — Ambulatory Visit (INDEPENDENT_AMBULATORY_CARE_PROVIDER_SITE_OTHER): Payer: Medicare HMO | Admitting: Adult Health

## 2022-01-14 ENCOUNTER — Encounter: Payer: Self-pay | Admitting: Adult Health

## 2022-01-14 ENCOUNTER — Other Ambulatory Visit: Payer: Self-pay

## 2022-01-14 VITALS — BP 118/60 | HR 76 | Resp 20 | Ht 73.0 in | Wt 220.8 lb

## 2022-01-14 DIAGNOSIS — I10 Essential (primary) hypertension: Secondary | ICD-10-CM

## 2022-01-14 NOTE — Patient Instructions (Signed)
Medication Instructions:  ?No Changes ?*If you need a refill on your cardiac medications before your next appointment, please call your pharmacy* ? ? ?Lab Work: ?No Labs ?If you have labs (blood work) drawn today and your tests are completely normal, you will receive your results only by: ?MyChart Message (if you have MyChart) OR ?A paper copy in the mail ?If you have any lab test that is abnormal or we need to change your treatment, we will call you to review the results. ? ? ?Testing/Procedures: ?No Testing ? ? ?Follow-Up: ?At [facility], you and your health needs are our priority.  As part of our continuing mission to provide you with exceptional heart care, we have created designated Provider Care Teams.  These Care Teams include your primary Cardiologist (physician) and Advanced Practice Providers (APPs -  Physician Assistants and Nurse Practitioners) who all work together to provide you with the care you need, when you need it. ? ?We recommend signing up for the patient portal called "MyChart".  Sign up information is provided on this After Visit Summary.  MyChart is used to connect with patients for Virtual Visits (Telemedicine).  Patients are able to view lab/test results, encounter notes, upcoming appointments, etc.  Non-urgent messages can be sent to your provider as well.   ?To learn more about what you can do with MyChart, go to [email or web address].   ? ?Your next appointment:   ?1 year(s) ? ?The format for your next appointment:   ?In Person ? ?Provider:   ?Peter Martinique, MD   ? ? ?  ?

## 2022-02-01 ENCOUNTER — Ambulatory Visit: Payer: Medicare HMO | Admitting: Orthopaedic Surgery

## 2022-02-02 ENCOUNTER — Ambulatory Visit: Payer: Medicare HMO | Admitting: Orthopaedic Surgery

## 2022-02-10 ENCOUNTER — Encounter: Payer: Self-pay | Admitting: Family Medicine

## 2022-02-10 ENCOUNTER — Ambulatory Visit (INDEPENDENT_AMBULATORY_CARE_PROVIDER_SITE_OTHER): Payer: Medicare HMO | Admitting: Family Medicine

## 2022-02-10 VITALS — BP 144/78 | HR 58 | Temp 97.8°F | Ht 73.0 in | Wt 222.8 lb

## 2022-02-10 DIAGNOSIS — E1165 Type 2 diabetes mellitus with hyperglycemia: Secondary | ICD-10-CM | POA: Diagnosis not present

## 2022-02-10 DIAGNOSIS — N411 Chronic prostatitis: Secondary | ICD-10-CM

## 2022-02-10 LAB — CBC
HCT: 42 % (ref 39.0–52.0)
Hemoglobin: 13.9 g/dL (ref 13.0–17.0)
MCHC: 33.1 g/dL (ref 30.0–36.0)
MCV: 93.8 fl (ref 78.0–100.0)
Platelets: 177 10*3/uL (ref 150.0–400.0)
RBC: 4.48 Mil/uL (ref 4.22–5.81)
RDW: 14.4 % (ref 11.5–15.5)
WBC: 6.2 10*3/uL (ref 4.0–10.5)

## 2022-02-10 LAB — COMPREHENSIVE METABOLIC PANEL
ALT: 22 U/L (ref 0–53)
AST: 21 U/L (ref 0–37)
Albumin: 4.4 g/dL (ref 3.5–5.2)
Alkaline Phosphatase: 67 U/L (ref 39–117)
BUN: 12 mg/dL (ref 6–23)
CO2: 30 mEq/L (ref 19–32)
Calcium: 9.5 mg/dL (ref 8.4–10.5)
Chloride: 101 mEq/L (ref 96–112)
Creatinine, Ser: 0.86 mg/dL (ref 0.40–1.50)
GFR: 83.12 mL/min (ref >60.00)
Glucose, Bld: 163 mg/dL — ABNORMAL HIGH (ref 70–99)
Potassium: 4.9 mEq/L (ref 3.5–5.1)
Sodium: 137 mEq/L (ref 135–145)
Total Bilirubin: 0.4 mg/dL (ref 0.2–1.2)
Total Protein: 6.9 g/dL (ref 6.0–8.3)

## 2022-02-10 LAB — POCT GLYCOSYLATED HEMOGLOBIN (HGB A1C): Hemoglobin A1C: 7.3 % — AB (ref 4.0–5.6)

## 2022-02-10 NOTE — Progress Notes (Signed)
? ?  Robert [name] is a 76 y.o. male who presents today for an office visit. ? ?Assessment/Plan:  ?Chronic Problems Addressed Today: ?Prostatitis, recurrent ?Still having some testicular pain.  He would like to have labs today to rule out recurrence.  We will check CBC, c-Met, and urine culture.  Also place a referral for him to follow-up with urologist within the [facility] system per patient request. ? ?Type 2 diabetes mellitus with hyperglycemia ([place]) ?A1c improved to 7.3.  He is not currently on any medications.  We discussed lifestyle modifications.  We will follow-up again in 6 months. ? ? ?  ?Subjective:  ?HPI: ? ?Patient here to 3 months follow up. He was admitted for 3 days for urosepsis was started on antibiotics. Had work up including Ct scan which was reassuring. His infection was resolved. Today,he notes he still has not been feeling well.  He thinks his symptoms had reoccurred. He notes his symptom seems to be getting worse. His symptoms seem to be similar as before.  He has noticed some urine odor He is concerned about his symptoms might be reoccurring.  He saw his urologist about 3 weeks ago who did not think he had any signs of recurrence of infection.  It was recommended follow-up in 1 year. ? ?His A1c was 7.3 in the office today. He has been doing well since last visit. He has not been not on any medication. He is trying to follow healthy diet. He is trying to drink sugar free drinks. No vision changes  ? ?   ?  ?Objective:  ?Physical Exam: ?BP (!) 144/78 (BP Location: Right Arm)   Pulse (!) 58   Temp 97.8 ?F (36.6 ?C) (Temporal)   Ht $R'6\' 1"'Ea$  (1.854 m)   Wt 222 lb 12.8 oz (101.1 kg)   SpO2 99%   BMI 29.39 kg/m?   ?Gen: No acute distress, resting comfortably ?CV: Regular rate and rhythm with no murmurs appreciated ?Pulm: Normal work of breathing, clear to auscultation bilaterally with no crackles, wheezes, or rhonchi ?Neuro: Grossly normal, moves all extremities ?Psych: Normal affect and thought  content ? ?   ? ? ?I,Savera Zaman,acting as a Education administrator for Dimas Chyle, MD.,have documented all relevant documentation on the behalf of Dimas Chyle, MD,as directed by  Dimas Chyle, MD while in the presence of Dimas Chyle, MD.  ? ?I, Dimas Chyle, MD, have reviewed all documentation for this visit. The documentation on 02/10/22 for the exam, diagnosis, procedures, and orders are all accurate and complete. ? ?Algis Greenhouse. Jerline Pain, MD ?02/10/2022 9:21 AM  ? ?

## 2022-02-10 NOTE — Assessment & Plan Note (Addendum)
A1c improved to 7.3.  He is not currently on any medications.  We discussed lifestyle modifications.  We will follow-up again in 6 months. ?

## 2022-02-10 NOTE — Patient Instructions (Signed)
It was very nice to see you today! ? ?Your blood sugar looks great today.  Please continue to work on diet and exercise. ? ?We will check blood work and a urine sample and refer you to see a urologist. ? ?I will see you back in 6 months.  Come back to see me sooner if needed. ? ?Take care, ?Dr Jerline Pain ? ?PLEASE NOTE: ? ?If you had any lab tests please let us know if you have not heard back within a few days. You may see your results on mychart before we have a chance to review them but we will give you a call once they are reviewed by [place]. If we ordered any referrals today, please let us know if you have not heard from their office within the next week.  ? ?Please try these tips to maintain a healthy lifestyle: ? ?Eat at least 3 REAL meals and 1-2 snacks per day.  Aim for no more than 5 hours between eating.  If you eat breakfast, please do so within one hour of getting up.  ? ?Each meal should contain half fruits/vegetables, one quarter protein, and one quarter carbs (no bigger than a computer mouse) ? ?Cut down on sweet beverages. This includes juice, soda, and sweet tea.  ? ?Drink at least 1 glass of water with each meal and aim for at least 8 glasses per day ? ?Exercise at least 150 minutes every week.   ?

## 2022-02-10 NOTE — Assessment & Plan Note (Signed)
Still having some testicular pain.  He would like to have labs today to rule out recurrence.  We will check CBC, c-Met, and urine culture.  Also place a referral for him to follow-up with urologist within the [facility] system per patient request. ?

## 2022-02-11 LAB — URINE CULTURE
MICRO NUMBER:: 13201621
Result:: NO GROWTH
SPECIMEN QUALITY:: ADEQUATE

## 2022-02-14 ENCOUNTER — Ambulatory Visit: Payer: Medicare HMO | Admitting: Urology

## 2022-02-14 ENCOUNTER — Encounter: Payer: Self-pay | Admitting: Urology

## 2022-02-14 VITALS — BP 130/64 | HR 70 | Ht 73.0 in | Wt 222.8 lb

## 2022-02-14 DIAGNOSIS — R3912 Poor urinary stream: Secondary | ICD-10-CM

## 2022-02-14 DIAGNOSIS — N411 Chronic prostatitis: Secondary | ICD-10-CM | POA: Diagnosis not present

## 2022-02-14 DIAGNOSIS — N138 Other obstructive and reflux uropathy: Secondary | ICD-10-CM

## 2022-02-14 DIAGNOSIS — N401 Enlarged prostate with lower urinary tract symptoms: Secondary | ICD-10-CM

## 2022-02-14 LAB — URINALYSIS, ROUTINE W REFLEX MICROSCOPIC
Bilirubin, UA: NEGATIVE
Glucose, UA: NEGATIVE
Ketones, UA: NEGATIVE
Leukocytes,UA: NEGATIVE
Nitrite, UA: NEGATIVE
Protein,UA: NEGATIVE
RBC, UA: NEGATIVE
Specific Gravity, UA: 1.01 (ref 1.005–1.030)
Urobilinogen, Ur: 0.2 mg/dL (ref 0.2–1.0)
pH, UA: 5.5 (ref 5.0–7.5)

## 2022-02-14 NOTE — Progress Notes (Signed)
? ?02/14/2022 ?2:17 PM  ? ?Robert [name] ?Nov 27, 1945 ?[id number] ? ?Referring provider: Vivi Barrack, MD ?[address] ?[place],  [place] [address] ? ?No chief complaint on file. ? ? ?HPI: ? ?New patient-recently seen at [facility] with Dr. Abner Greenspan.  ? ?1) BPH - AUASS was 19 and now 13. He was on tams 0.8 mg po QHS after a bout of sepsis and rt E-O in Dec 2022. F/u UA clear, scrotal US benign (02/23) and WBC 6.2. He recently went back to 0.4 mg doing well. He doesn't recall his last PSA.  ? ?No GU surgery. Left inguinal hernia repair.  ? ?Today, seen for the above.  ? ?He worked for [employer].  ? ?UA clear  ? ? ? ? ?PMH: ?Past Medical History:  ?Diagnosis Date  ? Arthritis   ? CAD (coronary artery disease) 10/10/2011  ? NSTEMI with DES to the proximal RCA following flow wire evaluation. EF is normal.   ? GERD (gastroesophageal reflux disease)   ? H/O esophageal ulcer   ? History of stomach ulcers   ? Hyperlipidemia   ? Hypertension   ? Migraines   ? "quite often; maybe q 10d to 2 wk" (01/14/2014)  ? Myocardial infarction [facility]) 10/2011  ? OSA on CPAP   ? "wear it most of the time" (01/14/2014)  ? Pre-diabetes   ? 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 "left"  ? Squamous cell carcinoma 12/17/2016  ? ? ?Surgical History: ?Past Surgical History:  ?Procedure Laterality Date  ? BACK SURGERY    ? CATARACT EXTRACTION Bilateral 2019  ? CATARACT EXTRACTION W/ INTRAOCULAR LENS IMPLANT Bilateral 01/2019  ? CERVICAL DISC SURGERY  1980's?  ? CORONARY ANGIOPLASTY WITH STENT PLACEMENT  10/13/2011  ? DES to the proximal RCA. Dr Martinique  ? FRACTIONAL FLOW RESERVE WIRE Right 10/13/2011  ? Procedure: FRACTIONAL FLOW RESERVE WIRE;  Surgeon: Peter M Martinique, MD;  Location: [facility] CATH LAB;  Service: Cardiovascular;  Laterality: Right;  ? HERNIA REPAIR    ? INGUINAL HERNIA REPAIR  10/23/2012  ? Procedure: HERNIA REPAIR INGUINAL ADULT;  Surgeon: Odis Hollingshead, MD;  Location: [facility];  Service: General;  Laterality: Left;  Left lower quadrant; left inguinal  hernia repair with mesh  ? INSERTION OF MESH  10/23/2012  ? Procedure: INSERTION OF MESH;  Surgeon: Odis Hollingshead, MD;  Location: [facility];  Service: General;  Laterality: Left;  Left lower quadrant  ? LEFT HEART CATHETERIZATION WITH CORONARY ANGIOGRAM N/A 10/13/2011  ? Procedure: LEFT HEART CATHETERIZATION WITH CORONARY ANGIOGRAM;  Surgeon: Peter M Martinique, MD;  Location: [facility] CATH LAB;  Service: Cardiovascular;  Laterality: N/A;  ? PERCUTANEOUS CORONARY STENT INTERVENTION (PCI-S) Right 10/13/2011  ? Procedure: PERCUTANEOUS CORONARY STENT INTERVENTION (PCI-S);  Surgeon: Peter M Martinique, MD;  Location: [facility] CATH LAB;  Service: Cardiovascular;  Laterality: Right;  ? SHOULDER ARTHROSCOPY W/ ROTATOR CUFF REPAIR Left 1990's  ? TOTAL HIP ARTHROPLASTY Left 05/14/2021  ? Procedure: LEFT TOTAL HIP ARTHROPLASTY ANTERIOR APPROACH;  Surgeon: Mcarthur Rossetti, MD;  Location: [facility];  Service: Orthopedics;  Laterality: Left;  ? TRIGGER FINGER RELEASE Left   ? 3rd and 4th digits  ? ? ?Home Medications:  ?Allergies as of 02/14/2022   ? ?   Reactions  ? Sumatriptan Shortness Of Breath  ? REACTION: sob, headache scalp sensitivity  ? Penicillins Itching  ? Lipitor [atorvastatin] Nausea And Vomiting  ? Oxycodone Itching  ? ?  ? ?  ?Medication  List  ?  ? ?  ? Accurate as of February 14, 2022  2:17 PM. If you have any questions, ask your nurse or doctor.  ?  ?  ? ?  ? ?acetaminophen 500 MG tablet ?Commonly known as: TYLENOL ?Take 1,000 mg by mouth every 6 (six) hours as needed for moderate pain. ?  ?aspirin 81 MG chewable tablet ?Chew 1 tablet (81 mg total) by mouth 2 (two) times daily. ?What changed: when to take this ?  ?azelastine 0.1 % nasal spray ?Commonly known as: ASTELIN ?Place 1 spray into both nostrils daily as needed for rhinitis. ?  ?brimonidine 0.2 % ophthalmic solution ?Commonly known as: ALPHAGAN ?Place 1 drop into the right eye in the morning and at bedtime. ?  ?Emgality 120 MG/ML Soaj ?Generic drug: Galcanezumab-gnlm ?INJECT  120 MG INTO THE SKIN EVERY 30 (THIRTY) DAYS. ?  ?ezetimibe 10 MG tablet ?Commonly known as: ZETIA ?Take 0.5 tablets by mouth daily. ?  ?fluticasone 50 MCG/ACT nasal spray ?Commonly known as: FLONASE ?Place 1 spray into both nostrils daily as needed for allergies. ?  ?latanoprost 0.005 % ophthalmic solution ?Commonly known as: XALATAN ?Place 1 drop into the right eye at bedtime. ?  ?metoprolol tartrate 25 MG tablet ?Commonly known as: LOPRESSOR ?Take 12.5 mg by mouth daily. ?  ?multivitamins ther. w/minerals Tabs tablet ?Take 1 tablet by mouth daily. ?  ?nitroGLYCERIN 0.4 MG SL tablet ?Commonly known as: NITROSTAT ?Place 1 tablet (0.4 mg total) under the tongue every 5 (five) minutes as needed for chest pain. Do not exceed more than 3 doses per day. ?  ?Nurtec 75 MG Tbdp ?Generic drug: Rimegepant Sulfate ?TAKE 75 MG BY MOUTH DAILY AS NEEDED. FOR MIGRAINES. TAKE AS CLOSE TO ONSET OF MIGRAINE AS POSSIBLE. ONE DAILY MAXIMUM. ?What changed:  ?reasons to take this ?additional instructions ?  ?pantoprazole 40 MG tablet ?Commonly known as: PROTONIX ?Take 40 mg by mouth daily. ?  ?rosuvastatin 40 MG tablet ?Commonly known as: CRESTOR ?Take 40 mg by mouth daily. ?  ?SUPER B COMPLEX PO ?Take 1 tablet by mouth daily. ?  ?tamsulosin 0.4 MG Caps capsule ?Commonly known as: FLOMAX ?Take 1 capsule (0.4 mg total) by mouth daily. ?What changed: additional instructions ?  ?traMADol 50 MG tablet ?Commonly known as: ULTRAM ?Take 50 mg by mouth daily as needed for moderate pain. ?  ?traMADol 50 MG tablet ?Commonly known as: ULTRAM ?TAKE TWO TABLETS BY MOUTH FOUR TIMES A DAY AS NEEDED FOR RELIEF OF MIGRAINE ?  ?vitamin C 1000 MG tablet ?Take 1,000 mg by mouth daily. ?  ?vitamin E 180 MG (400 UNITS) capsule ?Take 400 Units by mouth daily. ?  ?zinc gluconate 50 MG tablet ?Take 50 mg by mouth daily. ?  ? ?  ? ? ?Allergies:  ?Allergies  ?Allergen Reactions  ? Sumatriptan Shortness Of Breath  ?  REACTION: sob, headache scalp sensitivity  ?  Penicillins Itching  ? Lipitor [Atorvastatin] Nausea And Vomiting  ? Oxycodone Itching  ? ? ?Family History: ?Family History  ?Problem Relation Age of Onset  ? Cancer Mother   ?     lung  ? Heart disease Father   ?     heart attack   ? Cancer Father   ?     colon  ? Migraines Neg Hx   ? Headache Neg Hx   ? ? ?Social History:  reports that he quit smoking about 39 years ago. His smoking use included cigarettes. He has  a 40.00 pack-year smoking history. He has never used smokeless tobacco. He reports that he does not currently use alcohol. He reports that he does not use drugs. ? ? ?Physical Exam: ?BP 130/64   Pulse 70   Ht '6\' 1"'$  (1.854 m)   Wt 222 lb 12.8 oz (101.1 kg)   BMI 29.39 kg/m?   ?Constitutional:  Alert and oriented, No acute distress. ?HEENT: [place] AT, moist mucus membranes.  Trachea midline, no masses. ?Cardiovascular: No clubbing, cyanosis, or edema. ?Respiratory: Normal respiratory effort, no increased work of breathing. ?GI: Abdomen is soft, nontender, nondistended, no abdominal masses ?GU: No CVA tenderness ?Lymph: No cervical or inguinal lymphadenopathy. ?Skin: No rashes, bruises or suspicious lesions. ?Neurologic: Grossly intact, no focal deficits, moving all 4 extremities. ?Psychiatric: Normal mood and affect. ?GU: Penis uncircumcised, normal foreskin, testicles descended bilaterally and palpably normal, bilateral epididymis palpably normal, scrotum normal ?DRE: Prostate 30 g, smooth without hard area or nodule ? ? ?Laboratory Data: ?Lab Results  ?Component Value Date  ? WBC 6.2 02/10/2022  ? HGB 13.9 02/10/2022  ? HCT 42.0 02/10/2022  ? MCV 93.8 02/10/2022  ? PLT 177.0 02/10/2022  ? ? ?Lab Results  ?Component Value Date  ? CREATININE 0.86 02/10/2022  ? ? ?Lab Results  ?Component Value Date  ? PSA 0.87 03/31/2010  ? PSA 0.62 02/24/2009  ? PSA 0.69 01/01/2008  ? ? ?No results found for: TESTOSTERONE ? ?Lab Results  ?Component Value Date  ? HGBA1C 7.3 (A) 02/10/2022  ? ? ?Urinalysis ?   ?Component  Value Date/Time  ? [place] YELLOW 11/26/2021 1214  ? APPEARANCEUR CLEAR 11/26/2021 1214  ? LABSPEC 1.015 11/26/2021 1214  ? PHURINE 6.0 11/26/2021 1214  ? GLUCOSEU NEGATIVE 11/26/2021 1214  ? HGBUR NEGATIVE 01/

## 2022-02-15 ENCOUNTER — Other Ambulatory Visit: Payer: Medicare HMO

## 2022-02-15 LAB — PSA: Prostate Specific Ag, Serum: 2.5 ng/mL (ref 0.0–4.0)

## 2022-02-15 NOTE — Progress Notes (Signed)
Please inform patient of the following: ? ?Labs are all normal. He has no signs of recurrent infection. ? ?Algis Greenhouse. Jerline Pain, MD ?02/15/2022 8:10 AM  ?

## 2022-02-25 ENCOUNTER — Ambulatory Visit: Payer: Medicare HMO | Admitting: Family

## 2022-02-25 ENCOUNTER — Other Ambulatory Visit: Payer: Self-pay | Admitting: Orthopaedic Surgery

## 2022-02-25 ENCOUNTER — Telehealth: Payer: Self-pay | Admitting: Orthopaedic Surgery

## 2022-02-25 ENCOUNTER — Other Ambulatory Visit (HOSPITAL_BASED_OUTPATIENT_CLINIC_OR_DEPARTMENT_OTHER): Payer: Self-pay

## 2022-02-25 ENCOUNTER — Other Ambulatory Visit: Payer: Self-pay

## 2022-02-25 ENCOUNTER — Encounter (HOSPITAL_BASED_OUTPATIENT_CLINIC_OR_DEPARTMENT_OTHER): Payer: Self-pay | Admitting: Emergency Medicine

## 2022-02-25 ENCOUNTER — Emergency Department (HOSPITAL_BASED_OUTPATIENT_CLINIC_OR_DEPARTMENT_OTHER)
Admission: EM | Admit: 2022-02-25 | Discharge: 2022-02-25 | Disposition: A | Discharge status: Home / Self Care | Payer: Medicare HMO | Attending: Emergency Medicine | Admitting: Emergency Medicine

## 2022-02-25 DIAGNOSIS — Z7982 Long term (current) use of aspirin: Secondary | ICD-10-CM | POA: Insufficient documentation

## 2022-02-25 DIAGNOSIS — M461 Sacroiliitis, not elsewhere classified: Secondary | ICD-10-CM | POA: Diagnosis not present

## 2022-02-25 DIAGNOSIS — M533 Sacrococcygeal disorders, not elsewhere classified: Secondary | ICD-10-CM | POA: Insufficient documentation

## 2022-02-25 DIAGNOSIS — M545 Low back pain, unspecified: Secondary | ICD-10-CM | POA: Insufficient documentation

## 2022-02-25 DIAGNOSIS — M5459 Other low back pain: Secondary | ICD-10-CM | POA: Diagnosis not present

## 2022-02-25 MED ORDER — HYDROCODONE-ACETAMINOPHEN 5-325 MG PO TABS
1.0000 | ORAL_TABLET | Freq: Four times a day (QID) | ORAL | 0 refills | Status: DC | PRN
  Filled 2022-02-25: qty 20, 5d supply, fill #0

## 2022-02-25 MED ORDER — DEXAMETHASONE SODIUM PHOSPHATE 10 MG/ML IJ SOLN
10.0000 mg | Freq: Once | INTRAMUSCULAR | Status: AC
  Administered 2022-02-25: 10 mg via INTRAMUSCULAR
  Filled 2022-02-25: qty 1

## 2022-02-25 MED ORDER — PREDNISONE 50 MG PO TABS: ORAL_TABLET | ORAL | 0 refills | Status: DC

## 2022-02-25 NOTE — Telephone Encounter (Signed)
[phone] please call pt back at this number ? ? ?

## 2022-02-25 NOTE — Discharge Instructions (Signed)
1.  You were given a shot of Decadron in the emergency department.  This is a steroid to try to help with painful inflammation in the low back.  For stronger pain control, you may take 1 Vicodin tablet every 6 hours.  When you feel that your pain is sufficiently improving, go back to your routine use of tramadol and acetaminophen for pain control. ?2.  Call your neurosurgeons office to schedule follow-up as soon as possible ?3.  Return to emergency department immediately if you develop weakness or numbness in the leg, fevers, significantly worsening pain, urinary difficulties, abdominal pain or other concerning symptoms. ?

## 2022-02-25 NOTE — Telephone Encounter (Signed)
Lvm for pt to cb 

## 2022-02-25 NOTE — ED Provider Notes (Signed)
?[facility] EMERGENCY DEPT ?Provider Note ? ? ?CSN: [id number] ?Arrival date & time: 02/25/22  0957 ? ?  ? ?History ? ?Chief Complaint  ?Patient presents with  ? Back Pain  ? ? ?Robert [name] is a 76 y.o. male. ? ?HPI ?Patient reports he been having lower back pain for about a week and a half.  Its on the right side.  Patient reports that sporadically, if he sleeps the wrong way he will wake up with some back pain.  Usually after several days of some pain medication it goes away on its own.  This however has persisted and is worsened over about a week and a half.  Patient reports he is trying acetaminophen and tramadol combination for pain control.  He reports it does help but the pain still there and sometimes it gets worse. Pain is made worse by moving and twisting and bending.  No associated abdominal pain.  No associated weakness numbness or tingling to the legs.  No associated urinary dysfunction. ?  ? ?Home Medications ?Prior to Admission medications   ?Medication Sig Start Date End Date Taking? Authorizing Provider  ?HYDROcodone-acetaminophen (NORCO/VICODIN) 5-325 MG tablet Take 1 tablet by mouth every 6 (six) hours as needed for moderate pain or severe pain. 02/25/22  Yes Charlesetta Shanks, MD  ?predniSONE (DELTASONE) 50 MG tablet Take one tablet daily for 5 days. 02/25/22   Mcarthur Rossetti, MD  ?acetaminophen (TYLENOL) 500 MG tablet Take 1,000 mg by mouth every 6 (six) hours as needed for moderate pain.    [provider]  ?Ascorbic Acid (VITAMIN C) 1000 MG tablet Take 1,000 mg by mouth daily.    [provider]  ?aspirin 81 MG chewable tablet Chew 1 tablet (81 mg total) by mouth 2 (two) times daily. ?Patient taking differently: Chew 81 mg by mouth daily. 05/15/21   Pete Pelt, PA-C  ?azelastine (ASTELIN) 0.1 % nasal spray Place 1 spray into both nostrils daily as needed for rhinitis.    [provider]  ?B Complex-C (SUPER B COMPLEX PO) Take 1 tablet by mouth  daily.    [provider]  ?brimonidine (ALPHAGAN) 0.2 % ophthalmic solution Place 1 drop into the right eye in the morning and at bedtime.    [provider]  ?EMGALITY 120 MG/ML SOAJ INJECT 120 MG INTO THE SKIN EVERY 30 (THIRTY) DAYS. 09/23/21   [name], MD  ?ezetimibe (ZETIA) 10 MG tablet Take 0.5 tablets by mouth daily. 01/11/22   [provider]  ?fluticasone (FLONASE) 50 MCG/ACT nasal spray Place 1 spray into both nostrils daily as needed for allergies.    [provider]  ?latanoprost (XALATAN) 0.005 % ophthalmic solution Place 1 drop into the right eye at bedtime.    [provider]  ?metoprolol tartrate (LOPRESSOR) 25 MG tablet Take 12.5 mg by mouth daily.    [provider]  ?Multiple Vitamins-Minerals (MULTIVITAMINS THER. W/MINERALS) TABS Take 1 tablet by mouth daily.    [provider]  ?nitroGLYCERIN (NITROSTAT) 0.4 MG SL tablet Place 1 tablet (0.4 mg total) under the tongue every 5 (five) minutes as needed for chest pain. Do not exceed more than 3 doses per day. 05/20/21 11/12/21  Martinique, Peter M, MD  ?NURTEC 75 MG TBDP TAKE 75 MG BY MOUTH DAILY AS NEEDED. FOR MIGRAINES. TAKE AS CLOSE TO ONSET OF MIGRAINE AS POSSIBLE. ONE DAILY MAXIMUM. ?Patient taking differently: Take 75 mg by mouth daily as needed (migraine). 09/20/21  [name], MD  ?pantoprazole (PROTONIX) 40 MG tablet Take 40 mg by mouth daily. 01/28/21   [provider]  ?rosuvastatin (CRESTOR) 40 MG tablet Take 40 mg by mouth daily. 01/28/21   [provider]  ?tamsulosin (FLOMAX) 0.4 MG CAPS capsule Take 1 capsule (0.4 mg total) by mouth daily. ?Patient taking differently: Take 0.4 mg by mouth daily. Patient taking 2 tablets daily 11/03/21   Inda Coke, PA  ?traMADol (ULTRAM) 50 MG tablet Take 50 mg by mouth daily as needed for moderate pain. 08/17/21   [provider]  ?traMADol (ULTRAM) 50 MG tablet TAKE TWO TABLETS BY MOUTH FOUR TIMES A  DAY AS NEEDED FOR RELIEF OF MIGRAINE 01/11/22   [provider]  ?vitamin E 180 MG (400 UNITS) capsule Take 400 Units by mouth daily.    [provider]  ?zinc gluconate 50 MG tablet Take 50 mg by mouth daily.    [provider]  ?   ? ?Allergies    ?Sumatriptan, Penicillins, Lipitor [atorvastatin], and Oxycodone   ? ?Review of Systems   ?Review of Systems ?10 systems reviewed and negative except as per HPI ?Physical Exam ?Updated Vital Signs ?BP 136/77   Pulse 63   Temp 98.2 ?F (36.8 ?C) (Oral)   Resp 18   Ht '6\' 1"'$  (1.854 m)   Wt 100.7 kg   SpO2 93%   BMI 29.29 kg/m?  ?Physical Exam ?Constitutional:   ?   Comments: Alert nontoxic clinically well in appearance.  ?HENT:  ?   Head: Normocephalic and atraumatic.  ?   Mouth/Throat:  ?   Pharynx: Oropharynx is clear.  ?Eyes:  ?   Extraocular Movements: Extraocular movements intact.  ?Cardiovascular:  ?   Rate and Rhythm: Normal rate and regular rhythm.  ?Pulmonary:  ?   Effort: Pulmonary effort is normal.  ?   Breath sounds: Normal breath sounds.  ?Abdominal:  ?   General: There is no distension.  ?   Palpations: Abdomen is soft.  ?   Tenderness: There is no abdominal tenderness. There is no guarding.  ?Musculoskeletal:  ?   Comments: Focal discomfort to deep palpation over the SI joint.  No rashes.  No lower extremity swelling or calf pain.  Patient has discomfort going from supine to sitting up however he can stand up from the bed and ambulate without difficulty.  ?Skin: ?   General: Skin is warm and dry.  ?Neurological:  ?   General: No focal deficit present.  ?   Mental Status: He is oriented to person, place, and time.  ?   Motor: No weakness.  ?   Coordination: Coordination normal.  ?Psychiatric:     ?   Mood and Affect: Mood normal.  ? ? ?ED Results / Procedures / Treatments   ?Labs ?(all labs ordered are listed, but only abnormal results are displayed) ?Labs Reviewed - No data to display ? ?EKG ?None ? ?Radiology ?No results  found. ? ?Procedures ?Procedures  ? ? ?Medications Ordered in ED ?Medications  ?dexamethasone (DECADRON) injection 10 mg (10 mg Intramuscular Given 02/25/22 1136)  ? ? ?ED Course/ Medical Decision Making/ A&P ?  ?                        ?Medical Decision Making ?Risk ?Prescription drug management. ? ?Patient has had various episodes of recurrent back pain off and on.  Pain has persisted despite taking tramadol and acetaminophen.  [place] diagnosis includes musculoskeletal low back pain, kidney stone, intra-abdominal process. ? ?With physical exam pain localizes over the SI joint.  Patient's review of systems is otherwise negative.  At this time findings are consistent with musculoskeletal pain.  Given pain seems to localize to SI joint will trial Decadron IM and short course of hydrocodone.  Patient reports he has taken hydrocodone previously without adverse effect.  We discussed possibility of constipation and taking Colace and MiraLAX.  Patient's wife at bedside for all information. ? ? ? ? ? ? ? ? ?Final Clinical Impression(s) / ED Diagnoses ?Final diagnoses:  ?Acute right-sided low back pain without sciatica  ?Sacroiliac joint pain  ? ? ?Rx / DC Orders ?ED Discharge Orders   ? ?      Ordered  ?  HYDROcodone-acetaminophen (NORCO/VICODIN) 5-325 MG tablet  Every 6 hours PRN       ? 02/25/22 1244  ? ?  ?  ? ?  ? ? ?  ?Charlesetta Shanks, MD ?02/25/22 1255 ? ?

## 2022-02-25 NOTE — Telephone Encounter (Signed)
Lvm for pt to cb to discuss  

## 2022-02-25 NOTE — Telephone Encounter (Signed)
Please advise 

## 2022-02-25 NOTE — Telephone Encounter (Signed)
Pt called requesting a call back. Pt states to be suffering from lower back pains and need medical advice. PA Artis Delay nor Dr. Ninfa Linden has any opening until 4/24 and pt states he can barely walk. Please call pt at [phone]. ?

## 2022-02-25 NOTE — ED Triage Notes (Signed)
Patient arrives ambulatory with cane by POV with wife c/o lower back pain x 1.5 week. Reports hx of same that resolves on its own. Denies any injury.  ?

## 2022-02-28 ENCOUNTER — Telehealth: Payer: Self-pay | Admitting: Family Medicine

## 2022-02-28 ENCOUNTER — Telehealth: Payer: Self-pay | Admitting: Orthopaedic Surgery

## 2022-02-28 ENCOUNTER — Other Ambulatory Visit: Payer: Self-pay

## 2022-02-28 DIAGNOSIS — M4807 Spinal stenosis, lumbosacral region: Secondary | ICD-10-CM

## 2022-02-28 NOTE — Telephone Encounter (Signed)
done

## 2022-02-28 NOTE — Telephone Encounter (Signed)
Pt wife called and states pt was seen in the ER and needs to be sch with blackman or gil asap. I told her we don't have anything until the 24 and she would like him to be seen sooner if possible.  ? ?Cb [phone]  ?

## 2022-02-28 NOTE — Telephone Encounter (Signed)
Pt's daughter says pt had a steroid shot at a recent ED visit.  ? ?Has since had incessant hiccups. ? ?Please call ASAP to advise. ?

## 2022-02-28 NOTE — Telephone Encounter (Signed)
Called pt and verified DOB, pt wanted to schedule to see Jerline Pain tomorrow, schedule appt and advised pt if getting worse please go to urgent care or ED per [facility] PA. Pt verbalized understanding ?

## 2022-03-01 ENCOUNTER — Ambulatory Visit: Payer: Medicare HMO | Admitting: Family Medicine

## 2022-03-07 ENCOUNTER — Other Ambulatory Visit: Payer: Self-pay | Admitting: Physician Assistant

## 2022-03-09 ENCOUNTER — Ambulatory Visit: Payer: Medicare HMO | Admitting: Orthopaedic Surgery

## 2022-03-12 ENCOUNTER — Ambulatory Visit
Admission: RE | Admit: 2022-03-12 | Discharge: 2022-03-12 | Disposition: A | Discharge status: Home / Self Care | Payer: Medicare HMO | Source: Ambulatory Visit | Attending: Orthopaedic Surgery | Admitting: Orthopaedic Surgery

## 2022-03-12 DIAGNOSIS — M4807 Spinal stenosis, lumbosacral region: Secondary | ICD-10-CM

## 2022-03-12 DIAGNOSIS — M545 Low back pain, unspecified: Secondary | ICD-10-CM | POA: Diagnosis not present

## 2022-03-16 ENCOUNTER — Other Ambulatory Visit: Payer: Self-pay

## 2022-03-16 ENCOUNTER — Ambulatory Visit (INDEPENDENT_AMBULATORY_CARE_PROVIDER_SITE_OTHER): Payer: Medicare HMO | Admitting: Orthopaedic Surgery

## 2022-03-16 ENCOUNTER — Encounter: Payer: Self-pay | Admitting: Orthopaedic Surgery

## 2022-03-16 DIAGNOSIS — M545 Low back pain, unspecified: Secondary | ICD-10-CM | POA: Diagnosis not present

## 2022-03-16 NOTE — Progress Notes (Signed)
? ?Office Visit Note ?  ?Patient: Robert [name]           ?Date of Birth: 02-06-1946           ?MRN: [id number] ?Visit Date: 03/16/2022 ?             ?Requested by: Vivi Barrack, MD ?[address] ?[place],  [place] [id number] ?PCP: Vivi Barrack, MD ? ? ?Assessment & Plan: ?Visit Diagnoses:  ?1. Right-sided low back pain without sciatica, unspecified chronicity   ? ? ?Plan: We will send him to formal physical therapy to work on IT band stretching, hamstring stretching, back exercises, core strengthening home exercise program and modalities.  He will follow-up with [place] in 6 weeks sooner if there is any questions concerns.  Questions were encouraged and answered by Dr. Ninfa Linden and myself. ? ?Follow-Up Instructions: Return in about 6 weeks (around 04/27/2022).  ? ?Orders:  ?No orders of the defined types were placed in this encounter. ? ?No orders of the defined types were placed in this encounter. ? ? ? ? Procedures: ?No procedures performed ? ? ?Clinical Data: ?No additional findings. ? ? ?Subjective: ?Chief Complaint  ?Patient presents with  ? Lower Back - Follow-up  ? ? ?HPI ?Robert [name] is well-known to Dr. Delilah Shan service comes in today with low back pain on the right.  He actually went to the emergency room [facility] due to the low back pain.  He was given dexamethasone IM injection and oral prednisone he states the pain is much better.  He still has slight pain on the right side.  He denies any radicular symptoms down either leg.  He denies any saddle anesthesia, bowel or bladder dysfunction, weight loss, fevers or chills.  He does note waking pain though.  MRI images reviewed.  He did undergo an MRI of his lumbar spine which showed disc protrusion at L4-5 which likely abut the the L4 nerve root on the left.  Also narrowing left lateral recess which also was felt could not affect the descending left L5 nerve root.  Mild bilateral neuroforaminal stenosis L5-S1.  He notes that he has been doing a lot of yard work  putting out [employer] and such.  But no known injury to the back. ? ?Review of Systems  ?Constitutional:  Negative for chills, fever and unexpected weight change.  ?Musculoskeletal:  Positive for back pain.  ? ? ?Objective: ?Vital Signs: There were no vitals taken for this visit. ? ?Physical Exam ?Constitutional:   ?   Appearance: He is not ill-appearing or diaphoretic.  ?Pulmonary:  ?   Effort: Pulmonary effort is normal.  ?Neurological:  ?   Mental Status: He is alert and oriented to person, place, and time.  ?Psychiatric:     ?   Mood and Affect: Mood normal.  ? ? ?Ortho Exam ?Lower extremities 5 out of 5 strength throughout lower extremities against resistance.  Negative straight leg raise bilaterally which exquisitely tight hamstrings bilaterally.  He has limited flexion extension lumbar spine.  No tenderness over the lumbar spinal column or the paraspinous region. ?Specialty Comments:  ?No specialty comments available. ? ?Imaging: ?No results found. ? ? ?PMFS History: ?Patient Active Problem List  ? Diagnosis Date Noted  ? Chronic migraine without aura, with intractable migraine, so stated, with status migrainosus 09/20/2021  ? Status post total replacement of left hip 05/14/2021  ? Type 2 diabetes mellitus with hyperglycemia ([place]) 05/23/2018  ? Chronically dry eyes, bilateral 05/23/2018  ?  Migraines 11/28/2016  ? Hypertension associated with diabetes ([place]) 10/11/2011  ? History of basal cell carcinoma (BCC), right ear 03/03/2009  ? Hyperlipidemia associated with type 2 diabetes mellitus ([place]) 01/11/2008  ? Allergic rhinitis 01/07/2008  ? Prostatitis, recurrent 01/07/2008  ? History of gastric ulcer 01/07/2008  ? History of colon polyps, followed by Dr. Earlean Shawl 01/07/2008  ? GERD 04/02/2007  ? Sleep apnea 04/02/2007  ? ?Past Medical History:  ?Diagnosis Date  ? Arthritis   ? CAD (coronary artery disease) 10/10/2011  ? NSTEMI with DES to the proximal RCA following flow wire evaluation. EF is normal.   ? GERD  (gastroesophageal reflux disease)   ? H/O esophageal ulcer   ? History of stomach ulcers   ? Hyperlipidemia   ? Hypertension   ? Migraines   ? "quite often; maybe q 10d to 2 wk" (01/14/2014)  ? Myocardial infarction [facility]) 10/2011  ? OSA on CPAP   ? "wear it most of the time" (01/14/2014)  ? Pre-diabetes   ? 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 "left"  ? Squamous cell carcinoma 12/17/2016  ?  ?Family History  ?Problem Relation Age of Onset  ? Cancer Mother   ?     lung  ? Heart disease Father   ?     heart attack   ? Cancer Father   ?     colon  ? Migraines Neg Hx   ? Headache Neg Hx   ?  ?Past Surgical History:  ?Procedure Laterality Date  ? BACK SURGERY    ? CATARACT EXTRACTION Bilateral 2019  ? CATARACT EXTRACTION W/ INTRAOCULAR LENS IMPLANT Bilateral 01/2019  ? CERVICAL DISC SURGERY  1980's?  ? CORONARY ANGIOPLASTY WITH STENT PLACEMENT  10/13/2011  ? DES to the proximal RCA. Dr Martinique  ? FRACTIONAL FLOW RESERVE WIRE Right 10/13/2011  ? Procedure: FRACTIONAL FLOW RESERVE WIRE;  Surgeon: Peter M Martinique, MD;  Location: [facility] CATH LAB;  Service: Cardiovascular;  Laterality: Right;  ? HERNIA REPAIR    ? INGUINAL HERNIA REPAIR  10/23/2012  ? Procedure: HERNIA REPAIR INGUINAL ADULT;  Surgeon: Odis Hollingshead, MD;  Location: [place];  Service: General;  Laterality: Left;  Left lower quadrant; left inguinal hernia repair with mesh  ? INSERTION OF MESH  10/23/2012  ? Procedure: INSERTION OF MESH;  Surgeon: Odis Hollingshead, MD;  Location: [place];  Service: General;  Laterality: Left;  Left lower quadrant  ? LEFT HEART CATHETERIZATION WITH CORONARY ANGIOGRAM N/A 10/13/2011  ? Procedure: LEFT HEART CATHETERIZATION WITH CORONARY ANGIOGRAM;  Surgeon: Peter M Martinique, MD;  Location: [facility] CATH LAB;  Service: Cardiovascular;  Laterality: N/A;  ? PERCUTANEOUS CORONARY STENT INTERVENTION (PCI-S) Right 10/13/2011  ? Procedure: PERCUTANEOUS CORONARY STENT INTERVENTION (PCI-S);  Surgeon: Peter M Martinique, MD;  Location: [facility] CATH LAB;   Service: Cardiovascular;  Laterality: Right;  ? SHOULDER ARTHROSCOPY W/ ROTATOR CUFF REPAIR Left 1990's  ? TOTAL HIP ARTHROPLASTY Left 05/14/2021  ? Procedure: LEFT TOTAL HIP ARTHROPLASTY ANTERIOR APPROACH;  Surgeon: Mcarthur Rossetti, MD;  Location: [facility];  Service: Orthopedics;  Laterality: Left;  ? TRIGGER FINGER RELEASE Left   ? 3rd and 4th digits  ? ?Social History  ? ?Occupational History  ?  Comment: Retired -[employer]  ?Tobacco Use  ? Smoking status: Former  ?  Packs/day: 2.00  ?  Years: 20.00  ?  Pack years: 40.00  ?  Types: Cigarettes  ?  Quit date: 11/14/1982  ?  Years since quitting: 39.3  ? Smokeless tobacco: Never  ?Vaping Use  ? Vaping Use: Never used  ?Substance and Sexual Activity  ? Alcohol use: Not Currently  ?  Comment: occ  ? Drug use: No  ? Sexual activity: Not on file  ? ? ? ? ? ? ?

## 2022-03-24 ENCOUNTER — Ambulatory Visit: Payer: Medicare HMO | Admitting: Physical Therapy

## 2022-03-24 DIAGNOSIS — M5459 Other low back pain: Secondary | ICD-10-CM | POA: Diagnosis not present

## 2022-03-24 NOTE — Therapy (Addendum)
OUTPATIENT PHYSICAL THERAPY THORACOLUMBAR EVALUATION   Patient Name: Robert [name] MRN: [id number] DOB:1946/07/28, 76 y.o., male Today's Date: 03/24/2022   PT End of Session - 06/29/22 1408     Visit Number 1    Number of Visits 12    Date for PT Re-Evaluation 05/05/22    Authorization Type [employer]    PT Start Time 1218    PT Stop Time 1258    PT Time Calculation (min) 40 min    Activity Tolerance Patient tolerated treatment well    Behavior During Therapy [facility] for tasks assessed/performed             Past Medical History:  Diagnosis Date   Arthritis    CAD (coronary artery disease) 10/10/2011   NSTEMI with DES to the proximal RCA following flow wire evaluation. EF is normal.    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   CORONARY ANGIOPLASTY WITH STENT PLACEMENT  10/13/2011   DES to the proximal RCA. Dr Martinique   FRACTIONAL FLOW RESERVE WIRE Right 10/13/2011   Procedure: [place];  Surgeon: Peter M Martinique, MD;  Location: [facility] CATH LAB;  Service: Cardiovascular;  Laterality: Right;   HERNIA REPAIR     INGUINAL HERNIA REPAIR  10/23/2012   Procedure: HERNIA REPAIR INGUINAL ADULT;  Surgeon: Odis Hollingshead, MD;  Location: [place];  Service: General;  Laterality: Left;  Left lower quadrant; left inguinal hernia repair with mesh   INSERTION OF MESH  10/23/2012   Procedure: INSERTION OF MESH;  Surgeon: Odis Hollingshead, MD;  Location: [place];  Service: General;  Laterality: Left;  Left lower quadrant   LEFT HEART CATHETERIZATION WITH CORONARY ANGIOGRAM N/A 10/13/2011   Procedure: LEFT HEART CATHETERIZATION WITH CORONARY ANGIOGRAM;  Surgeon: Peter M Martinique, MD;  Location: [facility] CATH LAB;  Service: Cardiovascular;  Laterality: N/A;   PERCUTANEOUS CORONARY STENT INTERVENTION (PCI-S) Right 10/13/2011   Procedure: PERCUTANEOUS CORONARY STENT INTERVENTION (PCI-S);  Surgeon: Peter M Martinique, MD;  Location: [facility] CATH LAB;  Service: Cardiovascular;  Laterality: Right;   SHOULDER ARTHROSCOPY W/ ROTATOR CUFF REPAIR Left 1990's   TOTAL HIP ARTHROPLASTY Left 05/14/2021   Procedure: LEFT TOTAL HIP ARTHROPLASTY ANTERIOR APPROACH;  Surgeon: Mcarthur Rossetti, MD;  Location: [facility];  Service: Orthopedics;  Laterality: Left;   TRIGGER FINGER RELEASE Left    3rd and 4th digits   Patient Active Problem List   Diagnosis Date Noted   Acute medial meniscus tear of right knee 05/30/2022   Chronic migraine without aura, with intractable migraine, so stated, with status migrainosus 09/20/2021   Status post total replacement of left hip 05/14/2021   Type 2 diabetes mellitus with hyperglycemia ([place]) 05/23/2018   Chronically dry eyes, bilateral 05/23/2018   Migraines 11/28/2016   Hypertension associated with diabetes ([place])  10/11/2011   History of basal cell carcinoma (BCC), right ear 03/03/2009   Hyperlipidemia associated with type 2 diabetes mellitus ([place]) 01/11/2008   Allergic rhinitis 01/07/2008   Prostatitis, recurrent 01/07/2008   History of gastric ulcer 01/07/2008   History of colon polyps, followed by Dr. Earlean Shawl 01/07/2008   GERD 04/02/2007   Sleep apnea 04/02/2007    PCP: Dimas Chyle  REFERRING PROVIDER: Jean Rosenthal  REFERRING DIAG: Low back pain  THERAPY DIAG:  Other low back pain  ONSET DATE:   SUBJECTIVE:                                                                                                                                                                                            SUBJECTIVE STATEMENT:  Pt had L THA  05/14/2021. Now having increased pain in back on R. Had recent injection and prednisone, feels it did help. Hard time with bending, getting socks on due to pain.    PERTINENT HISTORY:  L THA 05/14/21,   PAIN:  Are you having pain? No NPRS scale: 4/10 Pain location: low back into LE  Pain description: Sore Aggravating factors: bending, getting socks on Relieving factors: none stated    PRECAUTIONS: None   FALLS:  Has patient fallen in last 6 months? No   PLOF: Independent  PATIENT GOALS Decreased pain, improve bending.    OBJECTIVE:   DIAGNOSTIC FINDINGS:    COGNITION:  Overall cognitive status: Within functional limits for tasks assessed    PALPATION: Mild stiffness and limitation in L hip,    LUMBAR ROM:   Active  A/PROM  03/24/2022  Flexion 14 in from floor  Extension Sig limitation  Right lateral flexion Sig limitation  Left lateral flexion Sig limitation  Right rotation Sig limitation  Left rotation Sig limitation   (Blank rows = not tested)  LE ROM:    03/24/22:  Hip Flexion: mild limitation on L;        ER: mild/mod limitation bil  LE MMT:   L hip: 4/5    R: 4/5     TODAY'S TREATMENT  Ther ex: see below for HEP   PATIENT EDUCATION:  Education details: PT POC, Exam findings, HEP Person educated: Patient Education method: Explanation, Demonstration, Tactile cues, Verbal cues, and Handouts Education comprehension: verbalized understanding, returned demonstration, verbal cues required, tactile cues required, and needs further education   HOME EXERCISE PROGRAM: Access Code: [email or web address] URL: [email or web address] Date: 03/24/2022 Prepared by: Lyndee Hensen  Exercises - Supine Single Knee to Chest Stretch  - 2 x daily - 3 reps - 30 hold - Seated Flexion Stretch  -  2 x daily - 3 reps - 20-30  hold - Supine Lower Trunk Rotation  - 2 x daily - 10 reps - 5 hold - Supine Posterior Pelvic Tilt  - 2 x daily - 1-2 sets - 10 reps - Seated Hamstring Stretch  - 2 x daily - 3 reps - 30 hold - Sidelying Hip Abduction  - 1 x daily - 1-2 sets - 10 reps   ASSESSMENT:  CLINICAL IMPRESSION: Patient presents with primary complaint of increased pain in low back. He has increased pain that is limiting ability for bending and functional activity. He had THA last year, and has some remaining tightness and weakness in hip. Pt with decreased lumbar ROM due to pain and stiffness. He will benefit from skilled PT to improve deficits and pain.    OBJECTIVE IMPAIRMENTS decreased activity tolerance, decreased mobility, decreased ROM, decreased strength, increased muscle spasms, improper body mechanics, and pain.   ACTIVITY LIMITATIONS cleaning, driving, shopping, and community activity.   PERSONAL FACTORS  none  are also affecting patient's functional outcome.    REHAB POTENTIAL: Good  CLINICAL DECISION MAKING: Stable/uncomplicated  EVALUATION COMPLEXITY: Low   GOALS: Goals reviewed with patient? Yes  SHORT TERM GOALS: Target date: 04/07/22  Pt to be independent with initial HEP  Goal status: INITIAL    LONG TERM GOALS: Target date: 05/05/22  Pt to be independent with final HEP  Goal status: INITIAL  2.  Pt to report decreased pain in back and LE to 0-2/10 with activity and bending.   Goal status: INITIAL  3.  Pt to demo optimal mechanics for bend/lift to improve pain in back with IADLs.   Goal status: INITIAL  4.  Pt to demo improved strength of bil hips to at least 4+/5 to improve stability and pain.   Goal status: INITIAL   PLAN: PT FREQUENCY: 1-2x/week  PT DURATION: 6 weeks  PLANNED INTERVENTIONS: Therapeutic exercises, Therapeutic activity, Neuromuscular re-education, Gait training, Patient/Family education, Joint mobilization, Joint manipulation, DME instructions, Dry  Needling, Electrical stimulation, Spinal manipulation, Spinal mobilization, Cryotherapy, Moist heat, Taping, Traction, Ultrasound, Ionotophoresis 4mg /ml Dexamethasone, and Manual therapy.  PLAN FOR NEXT SESSION:    Lyndee Hensen, PT, DPT 2:13 PM  06/29/22   PHYSICAL THERAPY DISCHARGE SUMMARY  Visits from Start of Care: 1 Plan: Patient agrees to discharge.  Patient goals were not met. Patient is being discharged due to - not returning after eval     Lyndee Hensen, PT, DPT 3:45 PM  08/15/22

## 2022-03-28 ENCOUNTER — Encounter: Payer: Self-pay | Admitting: Family Medicine

## 2022-03-28 ENCOUNTER — Ambulatory Visit: Payer: Medicare HMO | Admitting: Family Medicine

## 2022-03-28 VITALS — BP 137/76 | HR 56 | Temp 97.7°F | Ht 73.0 in | Wt 221.4 lb

## 2022-03-28 DIAGNOSIS — K219 Gastro-esophageal reflux disease without esophagitis: Secondary | ICD-10-CM | POA: Diagnosis not present

## 2022-03-28 NOTE — Patient Instructions (Addendum)
It was very nice to see you today! ? ?Please continue taking the Protonix twice daily.  You can also take famotidine or Pepcid 20 mg twice daily in addition to Protonix. ? ?I will refer you to see the gastroenterologist.  Please let me know if you would like for [place] to send in another prescription medication. ? ?Take care, ?Dr Jerline Pain ? ?PLEASE NOTE: ? ?If you had any lab tests please let us know if you have not heard back within a few days. You may see your results on mychart before we have a chance to review them but we will give you a call once they are reviewed by [place]. If we ordered any referrals today, please let us know if you have not heard from their office within the next week.  ? ?Please try these tips to maintain a healthy lifestyle: ? ?Eat at least 3 REAL meals and 1-2 snacks per day.  Aim for no more than 5 hours between eating.  If you eat breakfast, please do so within one hour of getting up.  ? ?Each meal should contain half fruits/vegetables, one quarter protein, and one quarter carbs (no bigger than a computer mouse) ? ?Cut down on sweet beverages. This includes juice, soda, and sweet tea.  ? ?Drink at least 1 glass of water with each meal and aim for at least 8 glasses per day ? ?Exercise at least 150 minutes every week.   ?

## 2022-03-28 NOTE — Assessment & Plan Note (Signed)
Symptoms are not adequately controlled on Protonix 40 mg twice daily.  He can try using over-the-counter Pepcid in addition to what he is doing already and we will refer to GI especially due to history of gastric ulcer in the past.  We discussed trial of Carafate however deferred for now until he is able to get into see GI.  Discussed reasons to return to care. ?

## 2022-03-28 NOTE — Progress Notes (Signed)
? ?  Robert [name] is a 76 y.o. male who presents today for an office visit. ? ?Assessment/Plan:  ?Chronic Problems Addressed Today: ?GERD ?Symptoms are not adequately controlled on Protonix 40 mg twice daily.  He can try using over-the-counter Pepcid in addition to what he is doing already and we will refer to GI especially due to history of gastric ulcer in the past.  We discussed trial of Carafate however deferred for now until he is able to get into see GI.  Discussed reasons to return to care. ? ? ?  ?Subjective:  ?HPI: ? ?See A/P for status of chronic conditions.  He is here with worsening reflux symptoms for the last several weeks.  Does have a longstanding history of GERD and has been on Protonix and other PPIs for the past several years.  He has noticed more regurgitation and belching for the last several weeks.  He tried doubling up his dose of Protonix which helped some however still has quite a bit of symptoms.  He has been taking Tums throughout the day to help manage his symptoms.  Last time he had such severe symptoms he was diagnosed with a gastric ulcer and he is concerned that he may have a recurrence. ? ?   ?  ?Objective:  ?Physical Exam: ?BP 137/76   Pulse (!) 56   Temp 97.7 ?F (36.5 ?C) (Temporal)   Ht '6\' 1"'$  (1.854 m)   Wt 221 lb 6.4 oz (100.4 kg)   SpO2 95%   BMI 29.21 kg/m?   ?Gen: No acute distress, resting comfortably ?CV: Regular rate and rhythm with no murmurs appreciated ?Pulm: Normal work of breathing, clear to auscultation bilaterally with no crackles, wheezes, or rhonchi ?Neuro: Grossly normal, moves all extremities ?Psych: Normal affect and thought content ? ?   ? ?Algis Greenhouse. Jerline Pain, MD ?03/28/2022 11:49 AM  ?

## 2022-03-29 ENCOUNTER — Telehealth: Payer: Medicare HMO | Admitting: Neurology

## 2022-03-29 NOTE — Progress Notes (Deleted)
[facility]    Provider:  Dr Jaynee Eagles Requesting Provider: Vivi Barrack, MD Primary Care Provider:  Vivi Barrack, MD  CC:  migraines  Virtual Visit via Telephone Note  I connected with Robert [name] on 03/29/22 at  9:30 AM EDT by telephone and verified that I am speaking with the correct person using two identifiers.  Location: Patient: home Provider: office   I discussed the limitations, risks, security and privacy concerns of performing an evaluation and management service by telephone and the availability of in person appointments. I also discussed with the patient that there may be a patient responsible charge related to this service. The patient expressed understanding and agreed to proceed.   Follow Up Instructions:    I discussed the assessment and treatment plan with the patient. The patient was provided an opportunity to ask questions and all were answered. The patient agreed with the plan and demonstrated an understanding of the instructions.   The patient was advised to call back or seek an in-person evaluation if the symptoms worsen or if the condition fails to improve as anticipated.  I provided over 21 minutes of non-face-to-face time during this encounter (this includes image review of CT head)   [name], MD   12/29/2021 In December he was admitted for urosepsis, he was there on IV antibiotics, he is on oral antibiotics and still not feeling well. He was having headaches but likely due to the  infection. He is getting the botox at the [place], he does think the [employer] (insurance did not approve Ajovy) is helping quite a bit but we should check back in a few months due to all the problems he had had recently to get a better baseline.  He is getting the Emgality from the pharmacy. Still he feels the migraines are improved on Emgality in addition to the botox. I also spoke to his wife.  Patient complains of symptoms per HPI as well as the  following symptoms: urosepsis . Pertinent negatives and positives per HPI. All others negative   HPI:  Robert [name] is a 76 y.o. male here as requested by Vivi Barrack, MD for migraines. He is on botox already and gets it completed every 9 weeks (I do not see any records on epic or "Care Everywhere" may be at the [place]).  He has been to pain management for cervical radiculopathy and chronic neck pain in the past and currently sees Dr. Ernestina Patches for low back pain and injections for pain management.  He has also seen other neurologists such as Dr. Tomi Likens at [facility] in 2016 and [place].  He has a past medical history of coronary artery disease, chronic intractable migraines, hyperlipidemia, hypertension, MI, OSA on CPAP, prediabetes, chronic dry eyes.  I reviewed Dr. 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  He has had migraines since the age of 20-19 lasted about 3 days went on years every 3 months. They have come closer and closer and now the he gets botox every 9 weeks. It helps reduce the pain by at least 50% but he has daily migraines.  He uses his cpap every night. He has  migraines every day can last all day, no known trigers, not upon waking, tramadol helps a little, unknwon triggers. The whole top of the head hurts, his eyes hurt, pounding/pulsating/throbbing, light sensitivity, sound nausea, no vomiting, some days are worse than others.They can be severe. He takes daily tramadol (discussed this can make it worse). He has [facility], [facility]y at the [facility], saw Dr. Loretta Plume neurologist. Triptans contraindicated due to stroke. We had  along talk about  options today.Wife here and provides much information. No other focal neurologic deficits, associated symptoms, inciting events or modifiable factors.  Reviewed notes, labs and imaging from outside physicians, which showed:    CT head 10/2021: showed No acute intracranial abnormalities including mass lesion or mass effect, hydrocephalus, extra-axial fluid collection, midline shift, hemorrhage, or acute infarction, large ischemic events (personally reviewed images)    Mri brain/orbits 2020: IMPRESSION:reviewed images and agree (reviewed with patient and his wife as well.) 1. No acute intracranial abnormality. 2. Mild-to-moderate chronic small vessel ischemic disease and small chronic left occipital infarct. 3. Negative orbital imaging.    From a thorough review of records, patient has tried the following medications that can be used in migraine management including Tylenol, aspirin, Flexeril, Plavix, Flexeril, Decadron injections, Valium, Aimovig, gabapentin, hydromorphone, ibuprofen, ketorolac injections, Robaxin, metoprolol, Zofran, oxycodone, tramadol, tizanidine, verapamil, topamax, amitriptyline.   Jhba1c 7.3 06/09/2021, tsh normal, cbc/cmp unremarkable  Review of Systems: Patient complains of symptoms per HPI as well as the following symptoms: sepsis admitted to the hospital . Pertinent negatives and positives per HPI. All others negative    Social History   Socioeconomic History   Marital status: Married    Spouse name: Not on file   Number of children: Not on file   Years of education: Not on file   Highest education level: 12th grade  Occupational History    Comment: Retired -[employer]  Tobacco Use   Smoking status: Former    Packs/day: 2.00    Years: 20.00    Pack years: 40.00    Types: Cigarettes    Quit date: 11/14/1982    Years since quitting: 39.3   Smokeless tobacco: Never  Vaping Use   Vaping Use: Never used  Substance and Sexual Activity   Alcohol use: Not  Currently    Comment: occ   Drug use: No   Sexual activity: Not on file  Other Topics Concern   Not on file  Social History Narrative   Pt lives with his wife. 1 and a half story home  No issues with stairs. 12th grade education   Social Determinants of Health   Financial Resource Strain: Low Risk    Difficulty of Paying Living Expenses: Not hard at all  Food Insecurity: No Food Insecurity   Worried About Charity fundraiser in the Last Year: Never true   Ran Out of Food in the Last Year: Never true  Transportation Needs: No Transportation Needs   Lack of Transportation (Medical): No   Lack of Transportation (Non-Medical): No  Physical Activity: Inactive   Days of Exercise per Week: 0 days   Minutes of Exercise per Session: 0 min  Stress: No Stress Concern Present   Feeling of Stress : Not at all  Social Connections: Moderately Integrated   Frequency of Communication with Friends and Family: More than three times a week   Frequency of Social Gatherings with Friends and Family: More than three times a week   Attends  Religious Services: Never   Active Member of Clubs or Organizations: Yes   Attends Archivist Meetings: 1 to 4 times per year   Marital Status: Married  Human resources officer Violence: Not At Risk   Fear of Current or Ex-Partner: No   Emotionally Abused: No   Physically Abused: No   Sexually Abused: No    Family History  Problem Relation Age of Onset   Cancer Mother        lung   Heart disease Father        heart attack    Cancer Father        colon   Migraines Neg Hx    Headache Neg Hx     Past Medical History:  Diagnosis Date   Arthritis    CAD (coronary artery disease) 10/10/2011   NSTEMI with DES to the proximal RCA following flow wire evaluation. EF is normal.    GERD (gastroesophageal reflux disease)    H/O esophageal ulcer    History of stomach ulcers    Hyperlipidemia    Hypertension    Migraines    "quite often; maybe q 10d to 2  wk" (01/14/2014)   Myocardial infarction ([place]) 10/2011   OSA on CPAP    "wear it most of the time" (01/14/2014)   Pre-diabetes    Squamous cell cancer of skin of earlobe    "left"   Squamous cell carcinoma 12/17/2016    Patient Active Problem List   Diagnosis Date Noted   Chronic migraine without aura, with intractable migraine, so stated, with status migrainosus 09/20/2021   Status post total replacement of left hip 05/14/2021   Type 2 diabetes mellitus with hyperglycemia ([place]) 05/23/2018   Chronically dry eyes, bilateral 05/23/2018   Migraines 11/28/2016   Hypertension associated with diabetes ([place]) 10/11/2011   History of basal cell carcinoma ([place]), right ear 03/03/2009   Hyperlipidemia associated with type 2 diabetes mellitus ([place]) 01/11/2008   Allergic rhinitis 01/07/2008   Prostatitis, recurrent 01/07/2008   History of gastric ulcer 01/07/2008   History of colon polyps, followed by Dr. Earlean Shawl 01/07/2008   GERD 04/02/2007   Sleep apnea 04/02/2007    Past Surgical History:  Procedure Laterality Date   BACK SURGERY     CATARACT EXTRACTION Bilateral 2019   CATARACT EXTRACTION W/ INTRAOCULAR LENS IMPLANT Bilateral 01/2019   CERVICAL DISC SURGERY  1980's?   CORONARY ANGIOPLASTY WITH STENT PLACEMENT  10/13/2011   DES to the proximal RCA. Dr Martinique   FRACTIONAL FLOW RESERVE WIRE Right 10/13/2011   Procedure: [place];  Surgeon: Peter M Martinique, MD;  Location: [facility] C[facility] LAB;  Service: Cardiovascular;  Laterality: Right;   HERNIA REPAIR     INGUINAL HERNIA REPAIR  10/23/2012   Procedure: HERNIA REPAIR INGUINAL ADULT;  Surgeon: Odis Hollingshead, MD;  Location: [place];  Service: General;  Laterality: Left;  Left lower quadrant; left inguinal hernia repair with mesh   INSERTION OF MESH  10/23/2012   Procedure: INSERTION OF MESH;  Surgeon: Odis Hollingshead, MD;  Location: [place];  Service: General;  Laterality: Left;  Left lower quadrant   LEFT HEART CATHETERIZATION  WITH CORONARY ANGIOGRAM N/A 10/13/2011   Procedure: LEFT HEART CATHETERIZATION WITH CORONARY ANGIOGRAM;  Surgeon: Peter M Martinique, MD;  Location[facility] CATH LAB;  Service: Cardiovascular;  Laterality: N/A;   PERCUTANEOUS CORONARY STENT INTERVENTION (PCI-S) Right 10/13/2011   Procedure: PERCUTANEOUS CORONARY STENT INTERVENTION (PCI-S);  Surgeon: Peter M Martinique, MD;  Location: [place] CATH LAB;  Service: Cardiovascular;  Laterality: Right;   SHOULDER ARTHROSCOPY W/ ROTATOR CUFF REPAIR Left 1990's   TOTAL HIP ARTHROPLASTY Left 05/14/2021   Procedure: LEFT TOTAL HIP ARTHROPLASTY ANTERIOR APPROACH;  Surgeon: Mcarthur Rossetti, MD;  Location: [facility];  Service: Orthopedics;  Laterality: Left;   TRIGGER FINGER RELEASE Left    3rd and 4th digits    Current Outpatient Medications  Medication Sig Dispense Refill   acetaminophen (TYLENOL) 500 MG tablet Take 1,000 mg by mouth every 6 (six) hours as needed for moderate pain.     Ascorbic Acid (VITAMIN C) 1000 MG tablet Take 1,000 mg by mouth daily.     aspirin 81 MG chewable tablet Chew 1 tablet (81 mg total) by mouth 2 (two) times daily. (Patient taking differently: Chew 81 mg by mouth daily.) 35 tablet 0   azelastine (ASTELIN) 0.1 % nasal spray Place 1 spray into both nostrils daily as needed for rhinitis.     B Complex-C (SUPER B COMPLEX PO) Take 1 tablet by mouth daily.     brimonidine (ALPHAGAN) 0.2 % ophthalmic solution Place 1 drop into the right eye in the morning and at bedtime.     EMGALITY 120 MG/ML SOAJ INJECT 120 MG INTO THE SKIN EVERY 30 (THIRTY) DAYS. 1 mL 11   ezetimibe (ZETIA) 10 MG tablet Take 0.5 tablets by mouth daily.     fluticasone (FLONASE) 50 MCG/ACT nasal spray Place 1 spray into both nostrils daily as needed for allergies.     HYDROcodone-acetaminophen (NORCO/VICODIN) 5-325 MG tablet Take 1 tablet by mouth every 6 (six) hours as needed for moderate pain or severe pain. 20 tablet 0   latanoprost (XALATAN) 0.005 % ophthalmic solution  Place 1 drop into the right eye at bedtime.     metoprolol tartrate (LOPRESSOR) 25 MG tablet Take 12.5 mg by mouth daily.     Multiple Vitamins-Minerals (MULTIVITAMINS THER. W/MINERALS) TABS Take 1 tablet by mouth daily.     nitroGLYCERIN (NITROSTAT) 0.4 MG SL tablet Place 1 tablet (0.4 mg total) under the tongue every 5 (five) minutes as needed for chest pain. Do not exceed more than 3 doses per day. 25 tablet 11   NURTEC 75 MG TBDP TAKE 75 MG BY MOUTH DAILY AS NEEDED. FOR MIGRAINES. TAKE AS CLOSE TO ONSET OF MIGRAINE AS POSSIBLE. ONE DAILY MAXIMUM. (Patient taking differently: Take 75 mg by mouth daily as needed (migraine).) 10 tablet 6   pantoprazole (PROTONIX) 40 MG tablet Take 40 mg by mouth daily.     rosuvastatin (CRESTOR) 40 MG tablet Take 40 mg by mouth daily.     tamsulosin (FLOMAX) 0.4 MG CAPS capsule TAKE 1 CAPSULE BY MOUTH EVERY DAY 30 capsule 1   traMADol (ULTRAM) 50 MG tablet Take 50 mg by mouth daily as needed for moderate pain.     traMADol (ULTRAM) 50 MG tablet TAKE TWO TABLETS BY MOUTH FOUR TIMES A DAY AS NEEDED FOR RELIEF OF MIGRAINE     vitamin E 180 MG (400 UNITS) capsule Take 400 Units by mouth daily.     zinc gluconate 50 MG tablet Take 50 mg by mouth daily.     No current facility-administered medications for this visit.    Allergies as of 03/29/2022 - Review Complete 03/28/2022  Allergen Reaction Noted   Sumatriptan Shortness Of Breath 01/07/2008   Penicillins Itching    Lipitor [atorvastatin] Nausea And Vomiting 12/09/2021   Oxycodone Itching 11/21/2012    Vitals: There  were no vitals taken for this visit. Last Weight:  Wt Readings from Last 1 Encounters:  03/28/22 221 lb 6.4 oz (100.4 kg)   Last Height:   Ht Readings from Last 1 Encounters:  03/28/22 '6\' 1"'$  (1.854 m)    Speech:    Speech is normal; fluent and spontaneous with normal comprehension.  Cognition:    The patient is oriented to person, place, and time;     recent and remote memory intact;      language fluent;     normal attention, concentration,     fund of knowledge   Assessment/Plan:  76 y.o. male here as requested by Vivi Barrack, MD for migraines. He is on botox already and gets it completed every 9 weeks (I do not see any records on epic or "Care Everywhere" may be at the [place]).  He has been to pain management for cervical radiculopathy and chronic neck pain in the past and currently sees Dr. Ernestina Patches for low back pain and injections for pain management.  He has also seen other neurologists such as Dr. Tomi Likens at [facility] neurology in 2016 and [place].  He has a past medical history of coronary artery disease, chronic intractable migraines, hyperlipidemia, hypertension, MI, OSA on CPAP, prediabetes, chronic dry eyes.  I reviewed Dr. 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 He was having headaches but likely due to the  infection. He is getting the botox at the [place], he does think the [employer] (insurance did not approve Ajovy) is helping quite a bit but we should check back in a few months due to all the problems he had had recently to get a better baseline.  He is getting the Emgality from the pharmacy. Still he feels the migraines are improved on Emgality in addition to the botox. I also spoke to his wife  - Triptans contraindicated, encephalomalacia in the left occipital lobe, unclear if old stroke. Nurtec.   Cc: Vivi Barrack, MD,  Vivi Barrack, MD  Sarina Ill, MD  [facility] [address] [place], [place] [address]  Phone [phone] Fax [phone]

## 2022-03-30 ENCOUNTER — Encounter: Payer: Self-pay | Admitting: Neurology

## 2022-03-30 ENCOUNTER — Encounter: Payer: Medicare HMO | Admitting: Physical Therapy

## 2022-03-31 ENCOUNTER — Encounter: Payer: Self-pay | Admitting: Physical Therapy

## 2022-04-12 ENCOUNTER — Encounter: Payer: Medicare HMO | Admitting: Physical Therapy

## 2022-04-15 ENCOUNTER — Telehealth: Payer: Self-pay | Admitting: Pharmacist

## 2022-04-15 NOTE — Progress Notes (Signed)
Chronic Care Management Pharmacy Assistant   Name: Robert [name]  MRN: [id number] DOB: [birth year]   Reason for Encounter: Diabetes Adherence Call   Recent office visits:  03/28/2022 OV (PCP) Vivi Barrack, MD; Symptoms are not adequately controlled on Protonix 40 mg twice daily.  He can try using over-the-counter Pepcid in addition to what he is doing already and we will refer to GI especially due to history of gastric ulcer in the past.  We discussed trial of Carafate however deferred for now until he is able to get into see GI.  Discussed reasons to return to care.   02/10/2022 OV (PCP) Vivi Barrack, MD; no medication changes indicated.  11/26/2021 OV (PCP) Vivi Barrack, MD; no medication changes indicated.  Recent consult visits:  03/16/2022 OV (Orthopedics) Mcarthur Rossetti, MD; no medication changes indicated.  01/14/2022 OV (Cardiology) Lendon Colonel, NP; no medication changes indicated.  12/29/2021 OV (Neurology) [name], MD; no medication changes indicated.  12/09/2021 OV (Cardiology) [name], PA-C; no medication changes indicated.  Hospital visits:  02/25/2022 ED visit for Acute right-sided low back pain without sciatica -Rx Hydrocodone 5-325 q6hrs prn  Medications: Outpatient Encounter Medications as of 04/15/2022  Medication Sig   acetaminophen (TYLENOL) 500 MG tablet Take 1,000 mg by mouth every 6 (six) hours as needed for moderate pain.   Ascorbic Acid (VITAMIN C) 1000 MG tablet Take 1,000 mg by mouth daily.   aspirin 81 MG chewable tablet Chew 1 tablet (81 mg total) by mouth 2 (two) times daily. (Patient taking differently: Chew 81 mg by mouth daily.)   azelastine (ASTELIN) 0.1 % nasal spray Place 1 spray into both nostrils daily as needed for rhinitis.   B Complex-C (SUPER B COMPLEX PO) Take 1 tablet by mouth daily.   brimonidine (ALPHAGAN) 0.2 % ophthalmic solution Place 1 drop into the right eye in the morning and at bedtime.    EMGALITY 120 MG/ML SOAJ INJECT 120 MG INTO THE SKIN EVERY 30 (THIRTY) DAYS.   ezetimibe (ZETIA) 10 MG tablet Take 0.5 tablets by mouth daily.   fluticasone (FLONASE) 50 MCG/ACT nasal spray Place 1 spray into both nostrils daily as needed for allergies.   HYDROcodone-acetaminophen (NORCO/VICODIN) 5-325 MG tablet Take 1 tablet by mouth every 6 (six) hours as needed for moderate pain or severe pain.   latanoprost (XALATAN) 0.005 % ophthalmic solution Place 1 drop into the right eye at bedtime.   metoprolol tartrate (LOPRESSOR) 25 MG tablet Take 12.5 mg by mouth daily.   Multiple Vitamins-Minerals (MULTIVITAMINS THER. W/MINERALS) TABS Take 1 tablet by mouth daily.   nitroGLYCERIN (NITROSTAT) 0.4 MG SL tablet Place 1 tablet (0.4 mg total) under the tongue every 5 (five) minutes as needed for chest pain. Do not exceed more than 3 doses per day.   NURTEC 75 MG TBDP TAKE 75 MG BY MOUTH DAILY AS NEEDED. FOR MIGRAINES. TAKE AS CLOSE TO ONSET OF MIGRAINE AS POSSIBLE. ONE DAILY MAXIMUM. (Patient taking differently: Take 75 mg by mouth daily as needed (migraine).)   pantoprazole (PROTONIX) 40 MG tablet Take 40 mg by mouth daily.   rosuvastatin (CRESTOR) 40 MG tablet Take 40 mg by mouth daily.   tamsulosin (FLOMAX) 0.4 MG CAPS capsule TAKE 1 CAPSULE BY MOUTH EVERY DAY   traMADol (ULTRAM) 50 MG tablet Take 50 mg by mouth daily as needed for moderate pain.   traMADol (ULTRAM) 50 MG tablet TAKE TWO TABLETS BY MOUTH FOUR TIMES A  DAY AS NEEDED FOR RELIEF OF MIGRAINE   vitamin E 180 MG (400 UNITS) capsule Take 400 Units by mouth daily.   zinc gluconate 50 MG tablet Take 50 mg by mouth daily.   No facility-administered encounter medications on file as of 04/15/2022.   Recent Relevant Labs: Lab Results  Component Value Date/Time   HGBA1C 7.3 (A) 02/10/2022 09:54 AM   HGBA1C 7.9 (A) 11/12/2021 02:45 PM   HGBA1C 8.2 (H) 11/04/2021 01:22 PM   HGBA1C 7.3 (H) 06/09/2021 09:48 AM   MICROALBUR <0.7 06/09/2021 09:48 AM    MICROALBUR <0.7 05/23/2018 09:12 AM    Kidney Function Lab Results  Component Value Date/Time   CREATININE 0.86 02/10/2022 09:53 AM   CREATININE 0.85 11/26/2021 12:14 PM   CREATININE 1.02 01/26/2018 03:55 PM   CREATININE 1.08 01/18/2017 09:32 AM   GFR 83.12 02/10/2022 09:53 AM   GFRNONAA >60 11/06/2021 05:04 AM   GFRAA 92 06/09/2020 10:54 AM    Current antihyperglycemic regimen:  None  What recent interventions/DTPs have been made to improve glycemic control:  No recent interventions or DTPs.  Have there been any recent hospitalizations or ED visits since last visit with CPP? No  Patient denies hypoglycemic symptoms.  Patient denies hyperglycemic symptoms.  How often are you checking your blood sugar? Patient states he does not check his blood sugars at home.  What are your blood sugars ranging?  None to report.  During the week, how often does your blood glucose drop below 70? Patient is unsure. However, he denies hypoglycemic symptoms.  Are you checking your feet daily/regularly? Yes  Adherence Review: Is the patient currently on a STATIN medication? Yes Is the patient currently on ACE/ARB medication? No Does the patient have >5 day gap between last estimated fill dates? No  Care Gaps: Medicare Annual Wellness: Completed 09/02/2021 Ophthalmology Exam: Overdue since 11/14/2019 Foot Exam: Overdue since 05/28/2020 Hemoglobin A1C: 7.3% on 02/10/2022 Colonoscopy: Completed 01/03/2017  Future Appointments  Date Time Provider [place]  04/27/2022  9:15 AM Mcarthur Rossetti, MD [facility] None  06/15/2022  8:20 AM Vivi Barrack, MD [facility]  08/15/2022  9:45 AM Festus Aloe, MD [facility] None  09/15/2022  9:30 AM [facility] HEALTH COACH [facility]   Star Rating Drugs: No filled dates available  April D Calhoun, [place] Pharmacist Assistant [phone]

## 2022-04-20 ENCOUNTER — Encounter: Payer: Medicare HMO | Admitting: Physical Therapy

## 2022-04-27 ENCOUNTER — Ambulatory Visit: Payer: Medicare HMO | Admitting: Orthopaedic Surgery

## 2022-05-11 ENCOUNTER — Encounter: Payer: Self-pay | Admitting: Family Medicine

## 2022-05-11 ENCOUNTER — Ambulatory Visit (INDEPENDENT_AMBULATORY_CARE_PROVIDER_SITE_OTHER): Payer: Medicare HMO | Admitting: Family Medicine

## 2022-05-11 VITALS — BP 150/60 | HR 67 | Temp 98.0°F | Ht 73.0 in | Wt 219.1 lb

## 2022-05-11 DIAGNOSIS — M25561 Pain in right knee: Secondary | ICD-10-CM | POA: Diagnosis not present

## 2022-05-11 NOTE — Progress Notes (Signed)
Subjective:     Patient ID: Robert [name], male    DOB: 03-02-1946, 76 y.o.   MRN: [id number]  Chief Complaint  Patient presents with   Arthritis    Arthritis in both knees, pain in right knee not getting better Possible referral to specialist       HPI B knee pain-long time, but worsening.  No injury.  Getting sharp pain R knee for 3 wks.  Using voltaren gel-helped chronic pain, except  the new pain R.  Went to [place] and did x-ray 2 wks ago.  Told some OA.  And did voltaren.  Went yesterday to [place] since not better and told to see PCP-no appt till end of July so here.Sharp pain medial side when wt bearing,   h/o PUD so avoids nsaids.   Health Maintenance Due  Topic Date Due   OPHTHALMOLOGY EXAM  11/14/2019   FOOT EXAM  05/28/2020   URINE MICROALBUMIN  06/09/2022    Past Medical History:  Diagnosis Date   Arthritis    CAD (coronary artery disease) 10/10/2011   NSTEMI with DES to the proximal RCA following flow wire evaluation. EF is normal.    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   CORONARY ANGIOPLASTY WITH STENT PLACEMENT  10/13/2011   DES to the proximal RCA. Dr Martinique   FRACTIONAL FLOW RESERVE WIRE Right 10/13/2011   Procedure: [place];  Surgeon: Peter M Martinique, MD;  Location: [facility] CATH LAB;  Service: Cardiovascular;  Laterality: Right;   HERNIA REPAIR     INGUINAL HERNIA REPAIR  10/23/2012    Procedure: HERNIA REPAIR INGUINAL ADULT;  Surgeon: Odis Hollingshead, MD;  Location: [place];  Service: General;  Laterality: Left;  Left lower quadrant; left inguinal hernia repair with mesh   INSERTION OF MESH  10/23/2012   Procedure: INSERTION OF MESH;  Surgeon: Odis Hollingshead, MD;  Location: [place];  Service: General;  Laterality: Left;  Left lower quadrant   LEFT HEART CATHETERIZATION WITH CORONARY ANGIOGRAM N/A 10/13/2011   Procedure: LEFT HEART CATHETERIZATION WITH CORONARY ANGIOGRAM;  Surgeon: Peter M Martinique, MD;  Location: [facility] CATH LAB;  Service: Cardiovascular;  Laterality: N/A;   PERCUTANEOUS CORONARY STENT INTERVENTION (PCI-S) Right 10/13/2011   Procedure: PERCUTANEOUS CORONARY STENT INTERVENTION (PCI-S);  Surgeon: Peter M Martinique, MD;  Location: [facility] CATH LAB;  Service: Cardiovascular;  Laterality: Right;   SHOULDER ARTHROSCOPY W/ ROTATOR CUFF REPAIR Left 1990's   TOTAL HIP ARTHROPLASTY Left 05/14/2021   Procedure: LEFT TOTAL HIP ARTHROPLASTY ANTERIOR APPROACH;  Surgeon: Mcarthur Rossetti, MD;  Location: [facility];  Service: Orthopedics;  Laterality: Left;   TRIGGER FINGER RELEASE Left    3rd and 4th digits    Outpatient Medications Prior to Visit  Medication Sig Dispense Refill   acetaminophen (  TYLENOL) 500 MG tablet Take 1,000 mg by mouth every 6 (six) hours as needed for moderate pain.     Ascorbic Acid (VITAMIN C) 1000 MG tablet Take 1,000 mg by mouth daily.     aspirin 81 MG chewable tablet Chew 1 tablet (81 mg total) by mouth 2 (two) times daily. (Patient taking differently: Chew 81 mg by mouth daily.) 35 tablet 0   azelastine (ASTELIN) 0.1 % nasal spray Place 1 spray into both nostrils daily as needed for rhinitis.     B Complex-C (SUPER B COMPLEX PO) Take 1 tablet by mouth daily.     brimonidine (ALPHAGAN) 0.2 % ophthalmic solution Place 1 drop into the right eye in the morning and at bedtime.     dicyclomine (BENTYL) 10 MG capsule TAKE 1 CAPSULE (10 MG TOTAL) BY MOUTH 4  (FOUR) TIMES DAILY   BEFORE MEALS AND AT BEDTIME.     EMGALITY 120 MG/ML SOAJ INJECT 120 MG INTO THE SKIN EVERY 30 (THIRTY) DAYS. 1 mL 11   ezetimibe (ZETIA) 10 MG tablet Take 0.5 tablets by mouth daily.     fluticasone (FLONASE) 50 MCG/ACT nasal spray Place 1 spray into both nostrils daily as needed for allergies.     gatifloxacin (ZYMAXID) 0.5 % SOLN INSTILL 1 DROP INTO SURGERY EYE 3X A DAY FOR 1WK, 2X A DAY FOR 1WK, 1X A DAY FOR 14 DAYS THEN STOP.     latanoprost (XALATAN) 0.005 % ophthalmic solution Place 1 drop into the right eye at bedtime.     metoprolol tartrate (LOPRESSOR) 25 MG tablet Take 12.5 mg by mouth daily.     Multiple Vitamins-Minerals (MULTIVITAMINS THER. W/MINERALS) TABS Take 1 tablet by mouth daily.     NURTEC 75 MG TBDP TAKE 75 MG BY MOUTH DAILY AS NEEDED. FOR MIGRAINES. TAKE AS CLOSE TO ONSET OF MIGRAINE AS POSSIBLE. ONE DAILY MAXIMUM. (Patient taking differently: Take 75 mg by mouth daily as needed (migraine).) 10 tablet 6   pantoprazole (PROTONIX) 40 MG tablet Take 40 mg by mouth daily.     prednisoLONE acetate (PRED FORTE) 1 % ophthalmic suspension      rosuvastatin (CRESTOR) 40 MG tablet Take 40 mg by mouth daily.     tamsulosin (FLOMAX) 0.4 MG CAPS capsule TAKE 1 CAPSULE BY MOUTH EVERY DAY 30 capsule 1   traMADol (ULTRAM) 50 MG tablet TAKE TWO TABLETS BY MOUTH FOUR TIMES A DAY AS NEEDED FOR RELIEF OF MIGRAINE     vitamin E 180 MG (400 UNITS) capsule Take 400 Units by mouth daily.     zinc gluconate 50 MG tablet Take 50 mg by mouth daily.     HYDROcodone-acetaminophen (NORCO/VICODIN) 5-325 MG tablet Take 1 tablet by mouth every 6 (six) hours as needed for moderate pain or severe pain. 20 tablet 0   nitroGLYCERIN (NITROSTAT) 0.4 MG SL tablet Place 1 tablet (0.4 mg total) under the tongue every 5 (five) minutes as needed for chest pain. Do not exceed more than 3 doses per day. 25 tablet 11   traMADol (ULTRAM) 50 MG tablet Take 50 mg by mouth daily as needed for moderate  pain.     traMADol (ULTRAM) 50 MG tablet TAKE TWO TABLETS BY MOUTH FOUR TIMES A DAY AS NEEDED FOR RELIEF OF MIGRAINE     No facility-administered medications prior to visit.    Allergies  Allergen Reactions   Sumatriptan Shortness Of Breath    REACTION: sob, headache scalp sensitivity   Penicillins Itching  Brinzolamide-Brimonidine     Other reaction(s): Itching of eye   Lipitor [Atorvastatin] Nausea And Vomiting   Oxycodone Itching   ROS neg/noncontributory except as noted HPI/below  Some dysphagia     Objective:     BP (!) 150/60   Pulse 67   Temp 98 F (36.7 C) (Temporal)   Ht '6\' 1"'$  (1.854 m)   Wt 219 lb 2 oz (99.4 kg)   SpO2 96%   BMI 28.91 kg/m  Wt Readings from Last 3 Encounters:  05/11/22 219 lb 2 oz (99.4 kg)  03/28/22 221 lb 6.4 oz (100.4 kg)  02/25/22 222 lb (100.7 kg)    Physical Exam   Gen: WDWN NAD HEENT: NCAT, conjunctiva not injected, sclera nonicteric EXT:  no edema MSK: R knee-some TTP medial joint line.  No edema.  Good strength.  Flex/ext ok.  Pain w/Mcmurray on medial side NEURO: A&O x3.  CN II-XII intact.  PSYCH: normal mood. Good eye contact    [facility] Assessment & Plan:   Problem List Items Addressed This Visit   None Visit Diagnoses     Acute pain of right knee    -  Primary     1  R knee pain-acute on chronic-stable chronic but acute still there.  ?tear.  Declines PT.  Since he has relationship w/ortho here, advised to call for appt(poss injection).  If problem, let us know.  Cont knee brace, tylenol, voltaren.   No orders of the defined types were placed in this encounter.   Wellington Hampshire, MD

## 2022-05-11 NOTE — Patient Instructions (Signed)
It was very nice to see you today!  Call Dr. Ninfa Linden for knee   PLEASE NOTE:  If you had any lab tests please let us know if you have not heard back within a few days. You may see your results on MyChart before we have a chance to review them but we will give you a call once they are reviewed by [place]. If we ordered any referrals today, please let us know if you have not heard from their office within the next week.   Please try these tips to maintain a healthy lifestyle:  Eat most of your calories during the day when you are active. Eliminate processed foods including packaged sweets (pies, cakes, cookies), reduce intake of potatoes, white bread, white pasta, and white rice. Look for whole grain options, oat flour or almond flour.  Each meal should contain half fruits/vegetables, one quarter protein, and one quarter carbs (no bigger than a computer mouse).  Cut down on sweet beverages. This includes juice, soda, and sweet tea. Also watch fruit intake, though this is a healthier sweet option, it still contains natural sugar! Limit to 3 servings daily.  Drink at least 1 glass of water with each meal and aim for at least 8 glasses per day  Exercise at least 150 minutes every week.

## 2022-05-12 ENCOUNTER — Encounter: Payer: Self-pay | Admitting: Internal Medicine

## 2022-05-27 NOTE — Progress Notes (Unsigned)
[place] Phone: [phone] Subjective:   Fontaine No, am serving as a scribe for Dr. Hulan Saas.   I'm seeing this patient by the request  of:  Vivi Barrack, MD  CC: Right knee pain  TOI:[id number]  Last seen 2018 for back and hip pain  CALOB BASKETTE is a 76 y.o. male coming in with complaint of R knee pain. Patient states about a month ago knees got really stiff. Got xrays and was to it was arthritis. Sharp pain behind right knee cap. Certain movement causes sharp pain. He does not remember any true injury.  Now affecting her daily activity fairly significantly.  Patient states that any turning motions he feels like there is any instability of the knee noted.      Past Medical History:  Diagnosis Date   Arthritis    CAD (coronary artery disease) 10/10/2011   NSTEMI with DES to the proximal RCA following flow wire evaluation. EF is normal.    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   CORONARY ANGIOPLASTY WITH STENT PLACEMENT  10/13/2011   DES to the proximal RCA. Dr Martinique   FRACTIONAL FLOW RESERVE WIRE Right 10/13/2011   Procedure: [place];  Surgeon: Peter M Martinique, MD;  Location: [facility] CATH LAB;  Service: Cardiovascular;  Laterality: Right;   HERNIA REPAIR     INGUINAL HERNIA REPAIR  10/23/2012   Procedure: HERNIA REPAIR  INGUINAL ADULT;  Surgeon: Odis Hollingshead, MD;  Location: [place];  Service: General;  Laterality: Left;  Left lower quadrant; left inguinal hernia repair with mesh   INSERTION OF MESH  10/23/2012   Procedure: INSERTION OF MESH;  Surgeon: Odis Hollingshead, MD;  Location: [place];  Service: General;  Laterality: Left;  Left lower quadrant   LEFT HEART CATHETERIZATION WITH CORONARY ANGIOGRAM N/A 10/13/2011   Procedure: LEFT HEART CATHETERIZATION WITH CORONARY ANGIOGRAM;  Surgeon: Peter M Martinique, MD;  Location: [facility] CATH LAB;  Service: Cardiovascular;  Laterality: N/A;   PERCUTANEOUS CORONARY STENT INTERVENTION (PCI-S) Right 10/13/2011   Procedure: PERCUTANEOUS CORONARY STENT INTERVENTION (PCI-S);  Surgeon: Peter M Martinique, MD;  Location: [facility] CATH LAB;  Service: Cardiovascular;  Laterality: Right;   SHOULDER ARTHROSCOPY W/ ROTATOR CUFF REPAIR Left 1990's   TOTAL HIP ARTHROPLASTY Left 05/14/2021   Procedure: LEFT TOTAL HIP ARTHROPLASTY ANTERIOR APPROACH;  Surgeon: Mcarthur Rossetti, MD;  [facility]: [facility];  Service: Orthopedics;  Laterality: Left;   TRIGGER FINGER RELEASE Left    3rd and 4th digits   Social History   Socioeconomic History   Marital status: Married    Spouse name: Not on file   Number of children: Not on file   Years of education: Not on file   Highest  education level: 12th grade  Occupational History    Comment: Retired -[employer]  Tobacco Use   Smoking status: Former    Packs/day: 2.00    Years: 20.00    Total pack years: 40.00    Types: Cigarettes    Quit date: 11/14/1982    Years since quitting: 39.5   Smokeless tobacco: Never  Vaping Use   Vaping Use: Never used  Substance and Sexual Activity   Alcohol use: Not Currently    Comment: occ   Drug use: No   Sexual activity: Not on file  Other Topics Concern   Not on file  Social History Narrative   Pt lives with his wife. 1 and a half story home  No issues with stairs. 12th grade education   Social Determinants of  Health   Financial Resource Strain: Low Risk  (09/02/2021)   Overall Financial Resource Strain (CARDIA)    Difficulty of Paying Living Expenses: Not hard at all  Food Insecurity: No Food Insecurity (09/02/2021)   Hunger Vital Sign    Worried About Running Out of Food in the Last Year: Never true    Ran Out of Food in the Last Year: Never true  Transportation Needs: No Transportation Needs (09/02/2021)   PRAPARE - Hydrologist (Medical): No    Lack of Transportation (Non-Medical): No  Physical Activity: Inactive (09/02/2021)   Exercise Vital Sign    Days of Exercise per Week: 0 days    Minutes of Exercise per Session: 0 min  Stress: No Stress Concern Present (09/02/2021)   [place]    Feeling of Stress : Not at all  Social Connections: Moderately Integrated (09/02/2021)   Social Connection and Isolation Panel [NHANES]    Frequency of Communication with Friends and Family: More than three times a week    Frequency of Social Gatherings with Friends and Family: More than three times a week    Attends Religious Services: Never    Marine scientist or Organizations: Yes    Attends Music therapist: 1 to 4 times per year    Marital Status: Married   Allergies  Allergen Reactions   Sumatriptan Shortness Of Breath    REACTION: sob, headache scalp sensitivity   Penicillins Itching   Brinzolamide-Brimonidine     Other reaction(s): Itching of eye   Lipitor [Atorvastatin] Nausea And Vomiting   Oxycodone Itching   Family History  Problem Relation Age of Onset   Cancer Mother        lung   Heart disease Father        heart attack    Cancer Father        colon   Migraines Neg Hx    Headache Neg Hx      Current Outpatient Medications (Cardiovascular):    ezetimibe (ZETIA) 10 MG tablet, Take 0.5 tablets by mouth daily.   metoprolol tartrate (LOPRESSOR) 25 MG tablet,  Take 12.5 mg by mouth daily.   nitroGLYCERIN (NITROSTAT) 0.4 MG SL tablet, Place 1 tablet (0.4 mg total) under the tongue every 5 (five) minutes as needed for chest pain. Do not exceed more than 3 doses per day.   rosuvastatin (CRESTOR) 40 MG tablet, Take 40 mg by mouth daily.  Current Outpatient Medications (Respiratory):    azelastine (ASTELIN) 0.1 % nasal spray, Place 1 spray into both nostrils daily as needed for rhinitis.   fluticasone (FLONASE) 50  MCG/ACT nasal spray, Place 1 spray into both nostrils daily as needed for allergies.  Current Outpatient Medications (Analgesics):    acetaminophen (TYLENOL) 500 MG tablet, Take 1,000 mg by mouth every 6 (six) hours as needed for moderate pain.   aspirin 81 MG chewable tablet, Chew 1 tablet (81 mg total) by mouth 2 (two) times daily. (Patient taking differently: Chew 81 mg by mouth daily.)   EMGALITY 120 MG/ML SOAJ, INJECT 120 MG INTO THE SKIN EVERY 30 (THIRTY) DAYS.   NURTEC 75 MG TBDP, TAKE 75 MG BY MOUTH DAILY AS NEEDED. FOR MIGRAINES. TAKE AS CLOSE TO ONSET OF MIGRAINE AS POSSIBLE. ONE DAILY MAXIMUM. (Patient taking differently: Take 75 mg by mouth daily as needed (migraine).)   traMADol (ULTRAM) 50 MG tablet, TAKE TWO TABLETS BY MOUTH FOUR TIMES A DAY AS NEEDED FOR RELIEF OF MIGRAINE   Current Outpatient Medications (Other):    Ascorbic Acid (VITAMIN C) 1000 MG tablet, Take 1,000 mg by mouth daily.   B Complex-C (SUPER B COMPLEX PO), Take 1 tablet by mouth daily.   brimonidine (ALPHAGAN) 0.2 % ophthalmic solution, Place 1 drop into the right eye in the morning and at bedtime.   dicyclomine (BENTYL) 10 MG capsule, TAKE 1 CAPSULE (10 MG TOTAL) BY MOUTH 4 (FOUR) TIMES DAILY   BEFORE MEALS AND AT BEDTIME.   gatifloxacin (ZYMAXID) 0.5 % SOLN, INSTILL 1 DROP INTO SURGERY EYE 3X A DAY FOR 1WK, 2X A DAY FOR 1WK, 1X A DAY FOR 14 DAYS THEN STOP.   latanoprost (XALATAN) 0.005 % ophthalmic solution, Place 1 drop into the right eye at bedtime.    Multiple Vitamins-Minerals (MULTIVITAMINS THER. W/MINERALS) TABS, Take 1 tablet by mouth daily.   pantoprazole (PROTONIX) 40 MG tablet, Take 40 mg by mouth daily.   prednisoLONE acetate (PRED FORTE) 1 % ophthalmic suspension,    tamsulosin (FLOMAX) 0.4 MG CAPS capsule, TAKE 1 CAPSULE BY MOUTH EVERY DAY   vitamin E 180 MG (400 UNITS) capsule, Take 400 Units by mouth daily.   zinc gluconate 50 MG tablet, Take 50 mg by mouth daily.   Reviewed prior external information including notes and imaging from  primary care provider As well as notes that were available from care everywhere and other healthcare systems.  Past medical history, social, surgical and family history all reviewed in electronic medical record.  No pertanent information unless stated regarding to the chief complaint.   Review of Systems:  No headache, visual changes, nausea, vomiting, diarrhea, constipation, dizziness, abdominal pain, skin rash, fevers, chills, night sweats, weight loss, swollen lymph nodes, body aches, joint swelling, chest pain, shortness of breath, mood changes. POSITIVE muscle aches  Objective  Blood pressure 130/70, pulse 72, height '6\' 1"'$  (1.854 m), weight 217 lb (98.4 kg), SpO2 93 %.   General: No apparent distress alert and oriented x3 mood and affect normal, dressed appropriately.  HEENT: Pupils equal, extraocular movements intact  Respiratory: Patient's speak in full sentences and does not appear short of breath  Cardiovascular: Trace lower extremity edema, non tender, no erythema  Right knee exam does have a small effusion noted.  Lacks last 15 degrees of flexion.  Patient does have a positive McMurray's noted with internal or external range of motion.  Limited muscular skeletal ultrasound was performed and interpreted by Hulan Saas, M   Limited ultrasound of patient's knee does have a trace hypoechoic changes noted of the patellofemoral joint.  Moderate narrowing noted as well. Acute findings  noted where patient  does have what appears to be 100% displacement noted of the medial meniscus.  Seems to be even potentially inverted.  Significant increase in Doppler flow noted in the area.  No masses appreciated.  No cortical defect of the bone noted.  Patient is even tender to even the palpation of the probe in this area. Impression: Acute displacement of the medial meniscus tear with underlying arthritis    Impression and Recommendations:     The above documentation has been reviewed and is accurate and complete Lyndal Pulley, DO

## 2022-05-30 ENCOUNTER — Encounter: Payer: Self-pay | Admitting: Family Medicine

## 2022-05-30 ENCOUNTER — Ambulatory Visit: Payer: Medicare HMO | Admitting: Family Medicine

## 2022-05-30 ENCOUNTER — Ambulatory Visit: Payer: Self-pay

## 2022-05-30 VITALS — BP 130/70 | HR 72 | Ht 73.0 in | Wt 217.0 lb

## 2022-05-30 DIAGNOSIS — M25561 Pain in right knee: Secondary | ICD-10-CM | POA: Diagnosis not present

## 2022-05-30 DIAGNOSIS — S83241A Other tear of medial meniscus, current injury, right knee, initial encounter: Secondary | ICD-10-CM | POA: Insufficient documentation

## 2022-05-30 NOTE — Assessment & Plan Note (Signed)
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  Patient also has what appears to be a trace effusion noted and some underlying arthritic changes.  Discussed the potential for different treatment options.  Patient is having difficulty even with daily activities at the moment.  Patient is accompanied with wife who states that he is having more difficulty as well with even walking to the mailbox somewhat.  Has severe pain, also keeping him up at night.  Discussed with patient other treatment options that are available but patient would consider surgical intervention if possible.  I am concerned that depending on the amount of severity of arthritic changes patient would maybe need a knee replacement.  We will see what the findings show and then discuss the matter afterwards.  Patient knows if significant worsening to seek medical attention immediately.

## 2022-05-30 NOTE — Patient Instructions (Addendum)
MRI R knee [id number] We will be in touch

## 2022-06-03 ENCOUNTER — Ambulatory Visit
Admission: RE | Admit: 2022-06-03 | Discharge: 2022-06-03 | Disposition: A | Discharge status: Home / Self Care | Payer: Medicare HMO | Source: Ambulatory Visit | Attending: Family Medicine | Admitting: Family Medicine

## 2022-06-03 DIAGNOSIS — M25561 Pain in right knee: Secondary | ICD-10-CM | POA: Diagnosis not present

## 2022-06-03 DIAGNOSIS — S83241A Other tear of medial meniscus, current injury, right knee, initial encounter: Secondary | ICD-10-CM

## 2022-06-07 ENCOUNTER — Telehealth: Payer: Self-pay | Admitting: Family Medicine

## 2022-06-07 ENCOUNTER — Other Ambulatory Visit: Payer: Self-pay | Admitting: Family Medicine

## 2022-06-07 DIAGNOSIS — S83241A Other tear of medial meniscus, current injury, right knee, initial encounter: Secondary | ICD-10-CM

## 2022-06-07 NOTE — Telephone Encounter (Signed)
Patient's wife called asking if a referral could be sent over to [facility]? (Based on the MRI results). He is scheduled to see them on Thursday.

## 2022-06-07 NOTE — Telephone Encounter (Signed)
Sent referral to [facility]

## 2022-06-09 DIAGNOSIS — M25561 Pain in right knee: Secondary | ICD-10-CM | POA: Diagnosis not present

## 2022-06-09 DIAGNOSIS — S83241A Other tear of medial meniscus, current injury, right knee, initial encounter: Secondary | ICD-10-CM | POA: Diagnosis not present

## 2022-06-09 DIAGNOSIS — M1711 Unilateral primary osteoarthritis, right knee: Secondary | ICD-10-CM | POA: Diagnosis not present

## 2022-06-13 ENCOUNTER — Ambulatory Visit: Payer: Medicare HMO | Admitting: Family Medicine

## 2022-06-15 ENCOUNTER — Ambulatory Visit (INDEPENDENT_AMBULATORY_CARE_PROVIDER_SITE_OTHER): Payer: Medicare HMO | Admitting: Family Medicine

## 2022-06-15 ENCOUNTER — Encounter: Payer: Self-pay | Admitting: Family Medicine

## 2022-06-15 ENCOUNTER — Ambulatory Visit: Payer: Medicare HMO | Admitting: Internal Medicine

## 2022-06-15 VITALS — BP 128/72 | HR 60 | Temp 97.5°F | Ht 73.0 in | Wt 219.4 lb

## 2022-06-15 DIAGNOSIS — Z0001 Encounter for general adult medical examination with abnormal findings: Secondary | ICD-10-CM

## 2022-06-15 DIAGNOSIS — S83241A Other tear of medial meniscus, current injury, right knee, initial encounter: Secondary | ICD-10-CM

## 2022-06-15 DIAGNOSIS — E1159 Type 2 diabetes mellitus with other circulatory complications: Secondary | ICD-10-CM | POA: Diagnosis not present

## 2022-06-15 DIAGNOSIS — E1165 Type 2 diabetes mellitus with hyperglycemia: Secondary | ICD-10-CM | POA: Diagnosis not present

## 2022-06-15 DIAGNOSIS — I152 Hypertension secondary to endocrine disorders: Secondary | ICD-10-CM | POA: Diagnosis not present

## 2022-06-15 DIAGNOSIS — G43109 Migraine with aura, not intractable, without status migrainosus: Secondary | ICD-10-CM | POA: Diagnosis not present

## 2022-06-15 DIAGNOSIS — N419 Inflammatory disease of prostate, unspecified: Secondary | ICD-10-CM

## 2022-06-15 DIAGNOSIS — K219 Gastro-esophageal reflux disease without esophagitis: Secondary | ICD-10-CM | POA: Diagnosis not present

## 2022-06-15 DIAGNOSIS — E785 Hyperlipidemia, unspecified: Secondary | ICD-10-CM

## 2022-06-15 DIAGNOSIS — E1169 Type 2 diabetes mellitus with other specified complication: Secondary | ICD-10-CM

## 2022-06-15 LAB — LIPID PANEL
Cholesterol: 157 mg/dL (ref 0–200)
HDL: 58.6 mg/dL (ref >39.00)
LDL Cholesterol: 75 mg/dL (ref 0–99)
NonHDL: 98.61
Total CHOL/HDL Ratio: 3
Triglycerides: 120 mg/dL (ref 0.0–149.0)
VLDL: 24 mg/dL (ref 0.0–40.0)

## 2022-06-15 LAB — COMPREHENSIVE METABOLIC PANEL
ALT: 25 U/L (ref 0–53)
AST: 21 U/L (ref 0–37)
Albumin: 4.4 g/dL (ref 3.5–5.2)
Alkaline Phosphatase: 61 U/L (ref 39–117)
BUN: 15 mg/dL (ref 6–23)
CO2: 29 mEq/L (ref 19–32)
Calcium: 9.7 mg/dL (ref 8.4–10.5)
Chloride: 101 mEq/L (ref 96–112)
Creatinine, Ser: 0.99 mg/dL (ref 0.40–1.50)
GFR: 72.95 mL/min (ref >60.00)
Glucose, Bld: 195 mg/dL — ABNORMAL HIGH (ref 70–99)
Potassium: 4.9 mEq/L (ref 3.5–5.1)
Sodium: 140 mEq/L (ref 135–145)
Total Bilirubin: 0.5 mg/dL (ref 0.2–1.2)
Total Protein: 6.9 g/dL (ref 6.0–8.3)

## 2022-06-15 LAB — PROTIME-INR
INR: 1 ratio (ref 0.8–1.0)
Prothrombin Time: 10.9 s (ref 9.6–13.1)

## 2022-06-15 LAB — URINALYSIS, ROUTINE W REFLEX MICROSCOPIC
Bilirubin Urine: NEGATIVE
Hgb urine dipstick: NEGATIVE
Ketones, ur: NEGATIVE
Leukocytes,Ua: NEGATIVE
Nitrite: NEGATIVE
RBC / HPF: NONE SEEN (ref 0–?)
Specific Gravity, Urine: <=1.005 — AB (ref 1.000–1.030)
Total Protein, Urine: NEGATIVE
Urine Glucose: NEGATIVE
Urobilinogen, UA: 0.2 (ref 0.0–1.0)
WBC, UA: NONE SEEN (ref 0–?)
pH: 6 (ref 5.0–8.0)

## 2022-06-15 LAB — CBC
HCT: 42.7 % (ref 39.0–52.0)
Hemoglobin: 14 g/dL (ref 13.0–17.0)
MCHC: 32.8 g/dL (ref 30.0–36.0)
MCV: 95 fl (ref 78.0–100.0)
Platelets: 178 10*3/uL (ref 150.0–400.0)
RBC: 4.49 Mil/uL (ref 4.22–5.81)
RDW: 13.9 % (ref 11.5–15.5)
WBC: 5.9 10*3/uL (ref 4.0–10.5)

## 2022-06-15 LAB — HEMOGLOBIN A1C: Hgb A1c MFr Bld: 8.7 % — ABNORMAL HIGH (ref 4.6–6.5)

## 2022-06-15 LAB — MICROALBUMIN / CREATININE URINE RATIO
Creatinine,U: 38 mg/dL
Microalb Creat Ratio: 1.8 mg/g (ref 0.0–30.0)
Microalb, Ur: <0.7 mg/dL (ref 0.0–1.9)

## 2022-06-15 LAB — TSH: TSH: 2.28 u[IU]/mL (ref 0.35–5.50)

## 2022-06-15 NOTE — Assessment & Plan Note (Signed)
On Protonix 40 mg daily.  Follows with GI.

## 2022-06-15 NOTE — Assessment & Plan Note (Signed)
Continue management per neurology.  He is on Radio producer.

## 2022-06-15 NOTE — Assessment & Plan Note (Signed)
At goal today on metoprolol tartrate 12.5 mg daily.

## 2022-06-15 NOTE — Assessment & Plan Note (Signed)
Check A1c.  Not currently on any medications.  We will likely need to follow-up again in 6 months pending on results of A1c today.

## 2022-06-15 NOTE — Progress Notes (Signed)
Chief Complaint:  Robert [name] is a 76 y.o. male who presents today for his annual comprehensive physical exam.    Assessment/Plan:  Chronic Problems Addressed Today: Hyperlipidemia associated with type 2 diabetes mellitus ([place]) Check lipids. He is on crestor '40mg'$  and Zetia '5mg'$  daily and tolerating.   GERD On Protonix 40 mg daily.  Follows with GI.  Hypertension associated with diabetes ([place]) At goal today on metoprolol tartrate 12.5 mg daily.  Type 2 diabetes mellitus with hyperglycemia ([name]) Check A1c.  Not currently on any medications.  We will likely need to follow-up again in 6 months pending on results of A1c today.  Migraines Continue management per neurology.  He is on Radio producer.  Prostatitis, recurrent Follows with urology.  No recent flareups.  Acute medial meniscus tear of right knee Following with orthopedics and will be undergoing arthroscopy later this month.  Pain is currently manageable.  Preventative Healthcare: Check labs.  Up-to-date on vaccines.  No longer needs colonoscopy due to age.  Patient Counseling(The following topics were reviewed and/or handout was given):  -Nutrition: Stressed importance of moderation in sodium/caffeine intake, saturated fat and cholesterol, caloric balance, sufficient intake of fresh fruits, vegetables, and fiber.  -Stressed the importance of regular exercise.   -Substance Abuse: Discussed cessation/primary prevention of tobacco, alcohol, or other drug use; driving or other dangerous activities under the influence; availability of treatment for abuse.   -Injury prevention: Discussed safety belts, safety helmets, smoke detector, smoking near bedding or upholstery.   -Sexuality: Discussed sexually transmitted diseases, partner selection, use of condoms, avoidance of unintended pregnancy and contraceptive alternatives.   -Dental health: Discussed importance of regular tooth brushing, flossing, and dental visits.  -Health  maintenance and immunizations reviewed. Please refer to Health maintenance section.  Return to care in 1 year for next preventative visit.     Subjective:  HPI:  He has no acute complaints today. See A/p for status of chronic conditions.      06/15/2022    8:18 AM  Depression screen PHQ 2/9  Decreased Interest 0  Down, Depressed, Hopeless 0  PHQ - 2 Score 0    Health Maintenance Due  Topic Date Due   OPHTHALMOLOGY EXAM  11/14/2019   FOOT EXAM  05/28/2020   URINE MICROALBUMIN  06/09/2022     ROS: Per HPI, otherwise a complete review of systems was negative.   PMH:  The following were reviewed and entered/updated in epic: Past Medical History:  Diagnosis Date   Arthritis    CAD (coronary artery disease) 10/10/2011   NSTEMI with DES to the proximal RCA following flow wire evaluation. EF is normal.    GERD (gastroesophageal reflux disease)    H/O esophageal ulcer    History of stomach ulcers    Hyperlipidemia    Hypertension    Migraines    "quite often; maybe q 10d to 2 wk" (01/14/2014)   Myocardial infarction ([place]) 10/2011   OSA on CPAP    "wear it most of the time" (01/14/2014)   Pre-diabetes    Squamous cell cancer of skin of earlobe    "left"   Squamous cell carcinoma 12/17/2016   Patient Active Problem List   Diagnosis Date Noted   Acute medial meniscus tear of right knee 05/30/2022   Chronic migraine without aura, with intractable migraine, so stated, with status migrainosus 09/20/2021   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 Earlean Shawl 01/07/2008   GERD 04/02/2007   Sleep apnea 04/02/2007   Past Surgical History:  Procedure Laterality Date   BACK SURGERY     CATARACT EXTRACTION Bilateral 2019   CATARACT EXTRACTION W/ INTRAOCULAR LENS IMPLANT Bilateral 01/2019   CERVICAL DISC SURGERY  1980's?   CORONARY ANGIOPLASTY WITH STENT PLACEMENT  10/13/2011   DES to the proximal RCA. Dr Martinique   FRACTIONAL FLOW RESERVE WIRE Right 10/13/2011   Procedure: [place];  Surgeon: Peter M Martinique, MD;  [facility] CATH LAB;  Service: Cardiovascular;  Laterality: Right;   HERNIA REPAIR     INGUINAL HERNIA REPAIR  10/23/2012   Procedure: HERNIA REPAIR INGUINAL ADULT;  Surgeon: Odis Hollingshead, MD;  Location: [place];  Service: General;  Laterality: Left;  Left lower quadrant; left inguinal hernia repair with mesh   INSERTION OF MESH  10/23/2012   Procedure: INSERTION OF MESH;  Surgeon: Odis Hollingshead, MD;  Location: [place];  Service: General;  Laterality: Left;  Left lower quadrant   LEFT HEART CATHETERIZATION WITH CORONARY ANGIOGRAM N/A 10/13/2011   Procedure: LEFT HEART CATHETERIZATION WITH CORONARY ANGIOGRAM;  Surgeon: Peter M Martinique, MD;  Location[facility] CATH LAB;  Service: Cardiovascular;  Laterality: N/A;   PERCUTANEOUS CORONARY STENT INTERVENTION (PCI-S) Right 10/13/2011   Procedure: PERCUTANEOUS CORONARY STENT INTERVENTION (PCI-S);  Surgeon: Peter M Martinique, MD;  Location[facility] C[facility] LAB;  Service: Cardiovascular;  Laterality: Right;   SHOULDER ARTHROSCOPY W/ ROTATOR CUFF REPAIR Left 1990's   TOTAL HIP ARTHROPLASTY Left 05/14/2021   Procedure: LEFT TOTAL HIP ARTHROPLASTY ANTERIOR APPROACH;  Surgeon: Mcarthur Rossetti, MD;  Location: [facility] ORS;  Service: Orthopedics;  Laterality: Left;   TRIGGER FINGER RELEASE Left    3rd and 4th digits    Family History  Problem Relation Age of Onset   Cancer Mother        lung   Heart disease Father        heart  attack    Cancer Father        colon   Migraines Neg Hx    Headache Neg Hx     Medications- reviewed and updated Current Outpatient Medications  Medication Sig Dispense Refill   acetaminophen (TYLENOL) 500 MG tablet Take 1,000 mg by mouth every 6 (six) hours as needed for moderate pain.     Ascorbic Acid (VITAMIN C) 1000 MG tablet Take 1,000 mg by mouth daily.     aspirin 81 MG chewable tablet Chew 1 tablet (81 mg total) by mouth 2 (two) times daily. (Patient taking differently: Chew 81 mg by mouth daily.) 35 tablet 0   azelastine (ASTELIN) 0.1 % nasal spray Place 1 spray into both nostrils daily as needed for rhinitis.     B Complex-C (SUPER B COMPLEX PO) Take 1 tablet by mouth daily.     brimonidine (ALPHAGAN) 0.2 % ophthalmic solution Place 1 drop into the right eye in the morning and at bedtime.     dicyclomine (BENTYL) 10 MG capsule TAKE 1 CAPSULE (10 MG TOTAL) BY MOUTH 4 (FOUR) TIMES DAILY   BEFORE  MEALS AND AT BEDTIME.     EMGALITY 120 MG/ML SOAJ INJECT 120 MG INTO THE SKIN EVERY 30 (THIRTY) DAYS. 1 mL 11   ezetimibe (ZETIA) 10 MG tablet Take 0.5 tablets by mouth daily.     fluticasone (FLONASE) 50 MCG/ACT nasal spray Place 1 spray into both nostrils daily as needed for allergies.     gatifloxacin (ZYMAXID) 0.5 % SOLN INSTILL 1 DROP INTO SURGERY EYE 3X A DAY FOR 1WK, 2X A DAY FOR 1WK, 1X A DAY FOR 14 DAYS THEN STOP.     latanoprost (XALATAN) 0.005 % ophthalmic solution Place 1 drop into the right eye at bedtime.     metoprolol tartrate (LOPRESSOR) 25 MG tablet Take 12.5 mg by mouth daily.     Multiple Vitamins-Minerals (MULTIVITAMINS THER. W/MINERALS) TABS Take 1 tablet by mouth daily.     NURTEC 75 MG TBDP TAKE 75 MG BY MOUTH DAILY AS NEEDED. FOR MIGRAINES. TAKE AS CLOSE TO ONSET OF MIGRAINE AS POSSIBLE. ONE DAILY MAXIMUM. (Patient taking differently: Take 75 mg by mouth daily as needed (migraine).) 10 tablet 6   pantoprazole (PROTONIX) 40 MG tablet Take 40 mg by mouth daily.      prednisoLONE acetate (PRED FORTE) 1 % ophthalmic suspension      rosuvastatin (CRESTOR) 40 MG tablet Take 40 mg by mouth daily.     tamsulosin (FLOMAX) 0.4 MG CAPS capsule TAKE 1 CAPSULE BY MOUTH EVERY DAY 30 capsule 1   traMADol (ULTRAM) 50 MG tablet TAKE TWO TABLETS BY MOUTH FOUR TIMES A DAY AS NEEDED FOR RELIEF OF MIGRAINE     vitamin E 180 MG (400 UNITS) capsule Take 400 Units by mouth daily.     zinc gluconate 50 MG tablet Take 50 mg by mouth daily.     nitroGLYCERIN (NITROSTAT) 0.4 MG SL tablet Place 1 tablet (0.4 mg total) under the tongue every 5 (five) minutes as needed for chest pain. Do not exceed more than 3 doses per day. 25 tablet 11   No current facility-administered medications for this visit.    Allergies-reviewed and updated Allergies  Allergen Reactions   Sumatriptan Shortness Of Breath    REACTION: sob, headache scalp sensitivity   Penicillins Itching   Brinzolamide-Brimonidine     Other reaction(s): Itching of eye   Lipitor [Atorvastatin] Nausea And Vomiting   Oxycodone Itching    Social History   Socioeconomic History   Marital status: Married    Spouse name: Not on file   Number of children: Not on file   Years of education: Not on file   Highest education level: 12th grade  Occupational History    Comment: Retired -[employer]  Tobacco Use   Smoking status: Former    Packs/day: 2.00    Years: 20.00    Total pack years: 40.00    Types: Cigarettes    Quit date: 11/14/1982    Years since quitting: 39.6   Smokeless tobacco: Never  Vaping Use   Vaping Use: Never used  Substance and Sexual Activity   Alcohol use: Not Currently    Comment: occ   Drug use: No   Sexual activity: Not on file  Other Topics Concern   Not on file  Social History Narrative   Pt lives with his wife. 1 and a half story home  No issues with stairs. 12th grade education   Social Determinants of Health   Financial Resource Strain: Low Risk  (09/02/2021)   Overall Financial  Resource Strain (CARDIA)  Difficulty of Paying Living Expenses: Not hard at all  Food Insecurity: No Food Insecurity (09/02/2021)   Hunger Vital Sign    Worried About Running Out of Food in the Last Year: Never true    Ran Out of Food in the Last Year: Never true  Transportation Needs: No Transportation Needs (09/02/2021)   PRAPARE - Hydrologist (Medical): No    Lack of Transportation (Non-Medical): No  Physical Activity: Inactive (09/02/2021)   Exercise Vital Sign    Days of Exercise per Week: 0 days    Minutes of Exercise per Session: 0 min  Stress: No Stress Concern Present (09/02/2021)   [place]    Feeling of Stress : Not at all  Social Connections: Moderately Integrated (09/02/2021)   Social Connection and Isolation Panel [NHANES]    Frequency of Communication with Friends and Family: More than three times a week    Frequency of Social Gatherings with Friends and Family: More than three times a week    Attends Religious Services: Never    Marine scientist or Organizations: Yes    Attends Music therapist: 1 to 4 times per year    Marital Status: Married        Objective:  Physical Exam: BP 128/72   Pulse 60   Temp (!) 97.5 F (36.4 C) (Temporal)   Ht '6\' 1"'$  (1.854 m)   Wt 219 lb 6.4 oz (99.5 kg)   SpO2 98%   BMI 28.95 kg/m   Body mass index is 28.95 kg/m. Wt Readings from Last 3 Encounters:  06/15/22 219 lb 6.4 oz (99.5 kg)  05/30/22 217 lb (98.4 kg)  05/11/22 219 lb 2 oz (99.4 kg)   Gen: NAD, resting comfortably HEENT: TMs normal bilaterally. OP clear. No thyromegaly noted.  CV: RRR with no murmurs appreciated Pulm: NWOB, CTAB with no crackles, wheezes, or rhonchi GI: Normal bowel sounds present. Soft, Nontender, Nondistended. MSK: no edema, cyanosis, or clubbing noted Skin: warm, dry Neuro: CN2-12 grossly intact. Strength 5/5 in upper and  lower extremities. Reflexes symmetric and intact bilaterally.  Psych: Normal affect and thought content     Debra Calabretta M. Jerline Pain, MD 06/15/2022 8:59 AM

## 2022-06-15 NOTE — Assessment & Plan Note (Signed)
Follows with urology.  No recent flareups.

## 2022-06-15 NOTE — Assessment & Plan Note (Signed)
Check lipids. He is on crestor '40mg'$  and Zetia '5mg'$  daily and tolerating.

## 2022-06-15 NOTE — Patient Instructions (Signed)
It was very nice to see you today!  We will check blood work and a urine sample today.  No medication changes.  Please continue to work on diet and exercise.  I will see back in year for your next physical.  Please come back to see [place] sooner if needed.  Take care, Dr Jerline Pain  PLEASE NOTE:  If you had any lab tests please let us know if you have not heard back within a few days. You may see your results on mychart before we have a chance to review them but we will give you a call once they are reviewed by [place]. If we ordered any referrals today, please let us know if you have not heard from their office within the next week.   Please try these tips to maintain a healthy lifestyle:  Eat at least 3 REAL meals and 1-2 snacks per day.  Aim for no more than 5 hours between eating.  If you eat breakfast, please do so within one hour of getting up.   Each meal should contain half fruits/vegetables, one quarter protein, and one quarter carbs (no bigger than a computer mouse)  Cut down on sweet beverages. This includes juice, soda, and sweet tea.   Drink at least 1 glass of water with each meal and aim for at least 8 glasses per day  Exercise at least 150 minutes every week.    Preventive Care 80 Years and Older, Male Preventive care refers to lifestyle choices and visits with your health care provider that can promote health and wellness. Preventive care visits are also called wellness exams. What can I expect for my preventive care visit? Counseling During your preventive care visit, your health care provider may ask about your: Medical history, including: Past medical problems. Family medical history. History of falls. Current health, including: Emotional well-being. Home life and relationship well-being. Sexual activity. Memory and ability to understand (cognition). Lifestyle, including: Alcohol, nicotine or tobacco, and drug use. Access to firearms. Diet, exercise, and sleep  habits. Work and work Statistician. Sunscreen use. Safety issues such as seatbelt and bike helmet use. Physical exam Your health care provider will check your: Height and weight. These may be used to calculate your BMI (body mass index). BMI is a measurement that tells if you are at a healthy weight. Waist circumference. This measures the distance around your waistline. This measurement also tells if you are at a healthy weight and may help predict your risk of certain diseases, such as type 2 diabetes and high blood pressure. Heart rate and blood pressure. Body temperature. Skin for abnormal spots. What immunizations do I need?  Vaccines are usually given at various ages, according to a schedule. Your health care provider will recommend vaccines for you based on your age, medical history, and lifestyle or other factors, such as travel or where you work. What tests do I need? Screening Your health care provider may recommend screening tests for certain conditions. This may include: Lipid and cholesterol levels. Diabetes screening. This is done by checking your blood sugar (glucose) after you have not eaten for a while (fasting). Hepatitis C test. Hepatitis B test. HIV (human immunodeficiency virus) test. STI (sexually transmitted infection) testing, if you are at risk. Lung cancer screening. Colorectal cancer screening. Prostate cancer screening. Abdominal aortic aneurysm (AAA) screening. You may need this if you are a current or former smoker. Talk with your health care provider about your test results, treatment options, and if necessary,  the need for more tests. Follow these instructions at home: Eating and drinking  Eat a diet that includes fresh fruits and vegetables, whole grains, lean protein, and low-fat dairy products. Limit your intake of foods with high amounts of sugar, saturated fats, and salt. Take vitamin and mineral supplements as recommended by your health care  provider. Do not drink alcohol if your health care provider tells you not to drink. If you drink alcohol: Limit how much you have to 0-2 drinks a day. Know how much alcohol is in your drink. In the [place] one drink equals one 12 oz bottle of beer (355 mL), one 5 oz glass of wine (148 mL), or one 1 oz glass of hard liquor (44 mL). Lifestyle Brush your teeth every morning and night with fluoride toothpaste. Floss one time each day. Exercise for at least 30 minutes 5 or more days each week. Do not use any products that contain nicotine or tobacco. These products include cigarettes, chewing tobacco, and vaping devices, such as e-cigarettes. If you need help quitting, ask your health care provider. Do not use drugs. If you are sexually active, practice safe sex. Use a condom or other form of protection to prevent STIs. Take aspirin only as told by your health care provider. Make sure that you understand how much to take and what form to take. Work with your health care provider to find out whether it is safe and beneficial for you to take aspirin daily. Ask your health care provider if you need to take a cholesterol-lowering medicine (statin). Find healthy ways to manage stress, such as: Meditation, yoga, or listening to music. Journaling. Talking to a trusted person. Spending time with friends and family. Safety Always wear your seat belt while driving or riding in a vehicle. Do not drive: If you have been drinking alcohol. Do not ride with someone who has been drinking. When you are tired or distracted. While texting. If you have been using any mind-altering substances or drugs. Wear a helmet and other protective equipment during sports activities. If you have firearms in your house, make sure you follow all gun safety procedures. Minimize exposure to UV radiation to reduce your risk of skin cancer. What's next? Visit your health care provider once a year for an annual wellness visit. Ask  your health care provider how often you should have your eyes and teeth checked. Stay up to date on all vaccines. This information is not intended to replace advice given to you by your health care provider. Make sure you discuss any questions you have with your health care provider. Document Revised: 04/28/2021 Document Reviewed: 04/28/2021 Elsevier Patient Education  [place].

## 2022-06-15 NOTE — Assessment & Plan Note (Signed)
Following with orthopedics and will be undergoing arthroscopy later this month.  Pain is currently manageable.

## 2022-06-17 NOTE — Progress Notes (Signed)
Please inform patient of the following:  His A1c is up quite a bit at 8.7.  Recommend starting metformin 500 mg daily to improve his numbers.  Please send in if he is willing to start.  Regardless he should continue to work on diet and exercise.  We should recheck in 3 months.

## 2022-06-18 ENCOUNTER — Other Ambulatory Visit: Payer: Self-pay | Admitting: *Deleted

## 2022-06-18 MED ORDER — METFORMIN HCL 500 MG PO TABS: 500.0000 mg | ORAL_TABLET | Freq: Every day | ORAL | 3 refills | Status: DC

## 2022-06-20 ENCOUNTER — Telehealth: Payer: Self-pay

## 2022-06-20 NOTE — Telephone Encounter (Signed)
Spoke with patient who is agreeable to do a tele visit on 8/11 at 10:20 a. Med rec and consent have been done.

## 2022-06-20 NOTE — Telephone Encounter (Signed)
Primary Cardiologist:Peter Martinique, MD   Preoperative team, please contact this patient and set up a phone call appointment for further preoperative risk assessment. Please obtain consent and complete medication review. Thank you for your help.   I confirm that guidance regarding antiplatelet and oral anticoagulation therapy has been completed and, if necessary, noted below (none requested).  Emmaline Life, NP-C    06/20/2022, 11:46 AM [place] [address] Office [phone] Fax [phone]

## 2022-06-20 NOTE — Telephone Encounter (Signed)
  Patient Consent for Virtual Visit        RYEN RHAMES has provided verbal consent on 06/20/2022 for a virtual visit (video or telephone).   CONSENT FOR VIRTUAL VISIT FOR:  [name]  By participating in this virtual visit I agree to the following:  I hereby voluntarily request, consent and authorize [facility] and its employed or contracted physicians, physician assistants, nurse practitioners or other licensed health care professionals (the Practitioner), to provide me with telemedicine health care services (the "Services") as deemed necessary by the treating Practitioner. I acknowledge and consent to receive the Services by the Practitioner via telemedicine. I understand that the telemedicine visit will involve communicating with the Practitioner through live audiovisual communication technology and the disclosure of certain medical information by electronic transmission. I acknowledge that I have been given the opportunity to request an in-person assessment or other available alternative prior to the telemedicine visit and am voluntarily participating in the telemedicine visit.  I understand that I have the right to withhold or withdraw my consent to the use of telemedicine in the course of my care at any time, without affecting my right to future care or treatment, and that the Practitioner or I may terminate the telemedicine visit at any time. I understand that I have the right to inspect all information obtained and/or recorded in the course of the telemedicine visit and may receive copies of available information for a reasonable fee.  I understand that some of the potential risks of receiving the Services via telemedicine include:  Delay or interruption in medical evaluation due to technological equipment failure or disruption; Information transmitted may not be sufficient (e.g. poor resolution of images) to allow for appropriate medical decision making by the Practitioner; and/or  In  rare instances, security protocols could fail, causing a breach of personal health information.  Furthermore, I acknowledge that it is my responsibility to provide information about my medical history, conditions and care that is complete and accurate to the best of my ability. I acknowledge that Practitioner's advice, recommendations, and/or decision may be based on factors not within their control, such as incomplete or inaccurate data provided by me or distortions of diagnostic images or specimens that may result from electronic transmissions. I understand that the practice of medicine is not an exact science and that Practitioner makes no warranties or guarantees regarding treatment outcomes. I acknowledge that a copy of this consent can be made available to me via my patient portal ([facility]), or I can request a printed copy by calling the office of [facility].    I understand that my insurance will be billed for this visit.   I have read or had this consent read to me. I understand the contents of this consent, which adequately explains the benefits and risks of the Services being provided via telemedicine.  I have been provided ample opportunity to ask questions regarding this consent and the Services and have had my questions answered to my satisfaction. I give my informed consent for the services to be provided through the use of telemedicine in my medical care

## 2022-06-20 NOTE — Telephone Encounter (Signed)
   Pre-operative Risk Assessment    Patient Name: Robert [name]  DOB: Nov 02, 1946 MRN: [id number]      Request for Surgical Clearance    Procedure:   Right knee arthoplasty   Date of Surgery:  Clearance 07/01/22                                 Surgeon:  Dr. Frederik Pear  Surgeon's Group or Practice Name:  [facility]  Phone number:  [phone] Fax number:  [phone]   Type of Clearance Requested:   - Medical    Type of Anesthesia:   Choice    Additional requests/questions:    SignedJacqulynn Cadet   06/20/2022, 9:43 AM

## 2022-06-23 ENCOUNTER — Telehealth: Payer: Self-pay | Admitting: Pharmacist

## 2022-06-23 NOTE — Progress Notes (Signed)
Chronic Care Management Pharmacy Assistant   Name: Robert [name]  MRN: [id number] DOB: 12-Jul-1946   Reason for Encounter: Diabetes Adherence Call    Recent office visits:  06/15/2022 OV (PCP) Vivi Barrack, MD; no medication changes indicated.  05/11/2022 OV (Fam Med) Tawnya Crook, MD;   Recent consult visits:  05/30/2022 OV (Sports Medicine) Lyndal Pulley, DO; no medication changes indicated.  Hospital visits:  None since last adherence call  Medications: Outpatient Encounter Medications as of 06/23/2022  Medication Sig   acetaminophen (TYLENOL) 500 MG tablet Take 1,000 mg by mouth every 6 (six) hours as needed for moderate pain.   Ascorbic Acid (VITAMIN C) 1000 MG tablet Take 1,000 mg by mouth daily.   aspirin 81 MG chewable tablet Chew 1 tablet (81 mg total) by mouth 2 (two) times daily. (Patient taking differently: Chew 81 mg by mouth daily.)   azelastine (ASTELIN) 0.1 % nasal spray Place 1 spray into both nostrils daily as needed for rhinitis.   B Complex-C (SUPER B COMPLEX PO) Take 1 tablet by mouth daily.   brimonidine (ALPHAGAN) 0.2 % ophthalmic solution Place 1 drop into the right eye in the morning and at bedtime.   dicyclomine (BENTYL) 10 MG capsule TAKE 1 CAPSULE (10 MG TOTAL) BY MOUTH 4 (FOUR) TIMES DAILY   BEFORE MEALS AND AT BEDTIME.   EMGALITY 120 MG/ML SOAJ INJECT 120 MG INTO THE SKIN EVERY 30 (THIRTY) DAYS.   ezetimibe (ZETIA) 10 MG tablet Take 0.5 tablets by mouth daily.   fluticasone (FLONASE) 50 MCG/ACT nasal spray Place 1 spray into both nostrils daily as needed for allergies.   gatifloxacin (ZYMAXID) 0.5 % SOLN INSTILL 1 DROP INTO SURGERY EYE 3X A DAY FOR 1WK, 2X A DAY FOR 1WK, 1X A DAY FOR 14 DAYS THEN STOP.   latanoprost (XALATAN) 0.005 % ophthalmic solution Place 1 drop into the right eye at bedtime.   metFORMIN (GLUCOPHAGE) 500 MG tablet Take 1 tablet (500 mg total) by mouth daily with breakfast.   metoprolol tartrate (LOPRESSOR) 25 MG tablet Take  12.5 mg by mouth daily.   Multiple Vitamins-Minerals (MULTIVITAMINS THER. W/MINERALS) TABS Take 1 tablet by mouth daily.   nitroGLYCERIN (NITROSTAT) 0.4 MG SL tablet Place 1 tablet (0.4 mg total) under the tongue every 5 (five) minutes as needed for chest pain. Do not exceed more than 3 doses per day.   NURTEC 75 MG TBDP TAKE 75 MG BY MOUTH DAILY AS NEEDED. FOR MIGRAINES. TAKE AS CLOSE TO ONSET OF MIGRAINE AS POSSIBLE. ONE DAILY MAXIMUM. (Patient taking differently: Take 75 mg by mouth daily as needed (migraine).)   pantoprazole (PROTONIX) 40 MG tablet Take 40 mg by mouth daily.   prednisoLONE acetate (PRED FORTE) 1 % ophthalmic suspension    rosuvastatin (CRESTOR) 40 MG tablet Take 40 mg by mouth daily.   tamsulosin (FLOMAX) 0.4 MG CAPS capsule TAKE 1 CAPSULE BY MOUTH EVERY DAY   traMADol (ULTRAM) 50 MG tablet TAKE TWO TABLETS BY MOUTH FOUR TIMES A DAY AS NEEDED FOR RELIEF OF MIGRAINE   vitamin E 180 MG (400 UNITS) capsule Take 400 Units by mouth daily.   zinc gluconate 50 MG tablet Take 50 mg by mouth daily.   No facility-administered encounter medications on file as of 06/23/2022.   Recent Relevant Labs: Lab Results  Component Value Date/Time   HGBA1C 8.7 (H) 06/15/2022 09:16 AM   HGBA1C 7.3 (A) 02/10/2022 09:54 AM   HGBA1C 7.9 (A) 11/12/2021 02:45  PM   HGBA1C 8.2 (H) 11/04/2021 01:22 PM   MICROALBUR <0.7 06/15/2022 09:16 AM   MICROALBUR <0.7 06/09/2021 09:48 AM    Kidney Function Lab Results  Component Value Date/Time   CREATININE 0.99 06/15/2022 09:16 AM   CREATININE 0.86 02/10/2022 09:53 AM   CREATININE 1.02 01/26/2018 03:55 PM   CREATININE 1.08 01/18/2017 09:32 AM   GFR 72.95 06/15/2022 09:16 AM   GFRNONAA >60 11/06/2021 05:04 AM   GFRAA 92 06/09/2020 10:54 AM    Current antihyperglycemic regimen:  Metformin 500 mg daily  What recent interventions/DTPs have been made to improve glycemic control:  No recent interventions or DTPs.  Have there been any recent  hospitalizations or ED visits since last visit with CPP? No  Patient denies hypoglycemic symptoms.  Patient denies hyperglycemic symptoms.  How often are you checking your blood sugar? Patient states he has ran out of test strips. He said that [facility] does not carry the brand he needs. [facility] what kind [facility] he needs. If not he will request and new meter and supplies from Dr. Jerline Pain.  Are you checking your feet daily/regularly? Patient states he has not been checking his feet. He states he is not able to get down that far. Patient states he will have his wife start checking them every few days.  Adherence Review: Is the patient currently on a STATIN medication? Yes Is the patient currently on ACE/ARB medication? No Does the patient have >5 day gap between last estimated fill dates? No  Care Gaps: Medicare Annual Wellness: Completed 09/02/2021 Ophthalmology Exam: Overdue since 11/14/2019 Foot Exam: Overdue since 05/28/2020 Hemoglobin A1C: 7.3% on 02/10/2022 Colonoscopy: Completed 01/03/2017  Future Appointments  Date Time Provider [place]  06/24/2022 10:20 AM [facility] PRE OP CLEARANCE APP [facility]  08/08/2022  9:30 AM [name], MD [facility] None  08/15/2022  9:45 AM Festus Aloe, MD [facility] None  08/15/2022 10:30 AM [facility] CCM PHARMACIST [facility]  09/15/2022  9:30 AM [facility] HEALTH COACH [facility]  09/20/2022  8:20 AM Vivi Barrack, MD [facility]  06/20/2023  8:00 AM Vivi Barrack, MD [facility]   Star Rating Drugs: Metformin 500 mg last filled 06/18/2022  April D Calhoun, [facility] Pharmacist Assistant [phone]

## 2022-06-24 ENCOUNTER — Ambulatory Visit (INDEPENDENT_AMBULATORY_CARE_PROVIDER_SITE_OTHER): Payer: Medicare HMO | Admitting: Physician Assistant

## 2022-06-24 DIAGNOSIS — Z01810 Encounter for preprocedural cardiovascular examination: Secondary | ICD-10-CM

## 2022-06-24 NOTE — Progress Notes (Signed)
Virtual Visit via Telephone Note   Because of Robert [name]'s co-morbid illnesses, he is at least at moderate risk for complications without adequate follow up.  This format is felt to be most appropriate for this patient at this time.  The patient did not have access to video technology/had technical difficulties with video requiring transitioning to audio format only (telephone).  All issues noted in this document were discussed and addressed.  No physical exam could be performed with this format.  Please refer to the patient's chart for his consent to telehealth for [facility].  Evaluation Performed:  Preoperative cardiovascular risk assessment _____________   Date:  06/24/2022   Patient ID:  Robert [name], DOB [birth year], MRN [id number] Patient Location:  Home Provider location:   Office  Primary Care Provider:  Vivi Barrack, MD Primary Cardiologist:  Peter Martinique, MD  Chief Complaint / Patient Profile   76 y.o. 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  Past Medical History    Past Medical History:  Diagnosis Date   Arthritis    CAD (coronary artery disease) 10/10/2011   NSTEMI with DES to the proximal RCA following flow wire evaluation. EF is normal.    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   CORONARY ANGIOPLASTY WITH STENT PLACEMENT  10/13/2011   DES to the proximal RCA. Dr Martinique   FRACTIONAL FLOW RESERVE WIRE Right 10/13/2011   Procedure: [place];  Surgeon: Peter M Martinique, MD;  [facility] C[facility] LAB;  Service: Cardiovascular;  Laterality: Right;   HERNIA REPAIR     INGUINAL HERNIA REPAIR  10/23/2012   Procedure: HERNIA REPAIR INGUINAL ADULT;  Surgeon: Odis Hollingshead, MD;  Location: [place];  Service: General;  Laterality: Left;  Left lower quadrant; left inguinal hernia repair with mesh   INSERTION OF MESH  10/23/2012   Procedure: INSERTION OF MESH;  Surgeon: Odis Hollingshead, MD;  Location: [place];  Service: General;  Laterality: Left;  Left lower quadrant   LEFT HEART CATHETERIZATION WITH CORONARY ANGIOGRAM N/A 10/13/2011   Procedure: LEFT HEART CATHETERIZATION WITH CORONARY ANGIOGRAM;  Surgeon: Peter M Martinique, MD;  Location[facility] CATH LAB;  Service: Cardiovascular;  Laterality: N/A;   PERCUTANEOUS CORONARY STENT INTERVENTION (PCI-S) Right 10/13/2011   Procedure: PERCUTANEOUS CORONARY STENT INTERVENTION (PCI-S);  Surgeon: Peter M Martinique, MD;  Location: [facility] CATH LAB;  Service: Cardiovascular;  Laterality: Right;   SHOULDER ARTHROSCOPY W/ ROTATOR CUFF REPAIR Left 1990's   TOTAL HIP  ARTHROPLASTY Left 05/14/2021   Procedure: LEFT TOTAL HIP ARTHROPLASTY ANTERIOR APPROACH;  Surgeon: Mcarthur Rossetti, MD;  Location: [facility];  Service: Orthopedics;  Laterality: Left;   TRIGGER FINGER RELEASE Left    3rd and 4th digits    Allergies  Allergies  Allergen Reactions   Sumatriptan Shortness Of Breath    REACTION: sob, headache scalp sensitivity   Penicillins Itching   Brinzolamide-Brimonidine     Other reaction(s): Itching of eye    Lipitor [Atorvastatin] Nausea And Vomiting   Oxycodone Itching    History of Present Illness    Robert [name] is a 76 y.o. male who presents via audio/video conferencing for a telehealth visit today.  Pt was last seen in cardiology clinic on  01/14/22 by [name], NP.  At that time DEMARCO BACCI was doing well .  The patient is now pending procedure as outlined above. Since his last visit, he has been doing really well without any chest pain or shortness of breath.  He states he was recently diagnosed with diabetes and is on metformin 500 mg daily.  He states that his activity is limited by his knee and not his heart.  He is able to walk 1-2 blocks on level ground as well as go up a flight of stairs slowly with support.  He does all of the inside housework and can do some yard work as well.  Due to this he is scored a 5.62 METS on the DASI which exceeds the 4 METS minimum requirement.   Although on his cardiac clearance request there were no medications needing to be held the patient does tell me that the surgeons office gave him holding parameters already for his aspirin.  Reports no shortness of breath nor dyspnea on exertion. Reports no chest pain, pressure, or tightness. No edema, orthopnea, PND. Reports no palpitations.    Home Medications    Prior to Admission medications   Medication Sig Start Date End Date Taking? Authorizing Provider  acetaminophen (TYLENOL) 500 MG tablet Take 1,000 mg by mouth every 6 (six) hours as needed for moderate pain.    [provider]  Ascorbic Acid (VITAMIN C) 1000 MG tablet Take 1,000 mg by mouth daily.    [provider]  aspirin 81 MG chewable tablet Chew 1 tablet (81 mg total) by mouth 2 (two) times daily. Patient taking differently: Chew 81 mg by mouth daily. 05/15/21   Pete Pelt, PA-C  azelastine (ASTELIN) 0.1 % nasal spray Place 1 spray into both nostrils daily as needed for rhinitis.    [provider]  B  Complex-C (SUPER B COMPLEX PO) Take 1 tablet by mouth daily.    [provider]  brimonidine (ALPHAGAN) 0.2 % ophthalmic solution Place 1 drop into the right eye in the morning and at bedtime.    [provider]  dicyclomine (BENTYL) 10 MG capsule TAKE 1 CAPSULE (10 MG TOTAL) BY MOUTH 4 (FOUR) TIMES DAILY   BEFORE MEALS AND AT BEDTIME.    [provider]  EMGALITY 120 MG/ML SOAJ INJECT 120 MG INTO THE SKIN EVERY 30 (THIRTY) DAYS. 09/23/21   [name], MD  ezetimibe (ZETIA) 10 MG tablet Take 0.5 tablets by mouth daily. 01/11/22   [provider]  fluticasone (FLONASE) 50 MCG/ACT nasal spray Place 1 spray into both nostrils daily as needed for allergies.    [provider]  gatifloxacin (ZYMAXID) 0.5 % SOLN INSTILL 1 DROP INTO SURGERY  EYE 3X A DAY FOR 1WK, 2X A DAY FOR 1WK, 1X A DAY FOR 14 DAYS THEN STOP.    [provider]  latanoprost (XALATAN) 0.005 % ophthalmic solution Place 1 drop into the right eye at bedtime.    [provider]  metFORMIN (GLUCOPHAGE) 500 MG tablet Take 1 tablet (500 mg total) by mouth daily with breakfast. 06/18/22   Vivi Barrack, MD  metoprolol tartrate (LOPRESSOR) 25 MG tablet Take 12.5 mg by mouth daily.    [provider]  Multiple Vitamins-Minerals (MULTIVITAMINS THER. W/MINERALS) TABS Take 1 tablet by mouth daily.    [provider]  nitroGLYCERIN (NITROSTAT) 0.4 MG SL tablet Place 1 tablet (0.4 mg total) under the tongue every 5 (five) minutes as needed for chest pain. Do not exceed more than 3 doses per day. 05/20/21 11/12/21  Martinique, Peter M, MD  NURTEC 75 MG TBDP TAKE 75 MG BY MOUTH DAILY AS NEEDED. FOR MIGRAINES. TAKE AS CLOSE TO ONSET OF MIGRAINE AS POSSIBLE. ONE DAILY MAXIMUM. Patient taking differently: Take 75 mg by mouth daily as needed (migraine). 09/20/21   [name], MD  pantoprazole (PROTONIX) 40 MG tablet Take 40 mg by mouth daily. 01/28/21   [provider]   prednisoLONE acetate (PRED FORTE) 1 % ophthalmic suspension     [provider]  rosuvastatin (CRESTOR) 40 MG tablet Take 40 mg by mouth daily. 01/28/21   [provider]  tamsulosin (FLOMAX) 0.4 MG CAPS capsule TAKE 1 CAPSULE BY MOUTH EVERY DAY 03/07/22   Inda Coke, PA  traMADol (ULTRAM) 50 MG tablet TAKE TWO TABLETS BY MOUTH FOUR TIMES A DAY AS NEEDED FOR RELIEF OF MIGRAINE 01/11/22   [provider]  vitamin E 180 MG (400 UNITS) capsule Take 400 Units by mouth daily.    [provider]  zinc gluconate 50 MG tablet Take 50 mg by mouth daily.    [provider]    Physical Exam    Vital Signs:  KWESI SANGHA does not have vital signs available for review today.  Given telephonic nature of communication, physical exam is limited. AAOx3. NAD. Normal affect.  Speech and respirations are unlabored.  Accessory Clinical Findings    None  Assessment & Plan    1.  Preoperative Cardiovascular Risk Assessment:  Mr. Ayre perioperative risk of a major cardiac event is 0.9% according to the Revised Cardiac Risk Index (RCRI).  Therefore, he is at low risk for perioperative complications.   His functional capacity is good at 5.62 METs according to the [place] Activity Status Index (DASI). Recommendations: According to ACC/AHA guidelines, no further cardiovascular testing needed.  The patient may proceed to surgery at acceptable risk.   Antiplatelet and/or Anticoagulation Recommendations: No medications were indicated as being held however the patient states today he was already given holding parameters by the surgeon.  A copy of this note will be routed to requesting surgeon.  Time:   Today, I have spent 10 minutes with the patient with telehealth technology discussing medical history, symptoms, and management plan.     [name], PA-C  06/24/2022, 10:11 AM

## 2022-06-29 ENCOUNTER — Encounter: Payer: Self-pay | Admitting: Physical Therapy

## 2022-07-01 DIAGNOSIS — M94261 Chondromalacia, right knee: Secondary | ICD-10-CM | POA: Diagnosis not present

## 2022-07-01 DIAGNOSIS — S83231A Complex tear of medial meniscus, current injury, right knee, initial encounter: Secondary | ICD-10-CM | POA: Diagnosis not present

## 2022-07-01 DIAGNOSIS — G8918 Other acute postprocedural pain: Secondary | ICD-10-CM | POA: Diagnosis not present

## 2022-07-01 DIAGNOSIS — M948X1 Other specified disorders of cartilage, shoulder: Secondary | ICD-10-CM | POA: Diagnosis not present

## 2022-07-07 DIAGNOSIS — M94261 Chondromalacia, right knee: Secondary | ICD-10-CM | POA: Diagnosis not present

## 2022-07-07 DIAGNOSIS — Z9889 Other specified postprocedural states: Secondary | ICD-10-CM | POA: Diagnosis not present

## 2022-07-07 DIAGNOSIS — S83231D Complex tear of medial meniscus, current injury, right knee, subsequent encounter: Secondary | ICD-10-CM | POA: Diagnosis not present

## 2022-07-12 DIAGNOSIS — M94261 Chondromalacia, right knee: Secondary | ICD-10-CM | POA: Diagnosis not present

## 2022-07-12 DIAGNOSIS — S83231D Complex tear of medial meniscus, current injury, right knee, subsequent encounter: Secondary | ICD-10-CM | POA: Diagnosis not present

## 2022-07-14 DIAGNOSIS — S83231D Complex tear of medial meniscus, current injury, right knee, subsequent encounter: Secondary | ICD-10-CM | POA: Diagnosis not present

## 2022-07-14 DIAGNOSIS — M94261 Chondromalacia, right knee: Secondary | ICD-10-CM | POA: Diagnosis not present

## 2022-07-14 DIAGNOSIS — S83241A Other tear of medial meniscus, current injury, right knee, initial encounter: Secondary | ICD-10-CM | POA: Diagnosis not present

## 2022-07-28 DIAGNOSIS — M1711 Unilateral primary osteoarthritis, right knee: Secondary | ICD-10-CM | POA: Diagnosis not present

## 2022-07-28 DIAGNOSIS — M25561 Pain in right knee: Secondary | ICD-10-CM | POA: Diagnosis not present

## 2022-07-29 DIAGNOSIS — N481 Balanitis: Secondary | ICD-10-CM | POA: Diagnosis not present

## 2022-07-29 DIAGNOSIS — N401 Enlarged prostate with lower urinary tract symptoms: Secondary | ICD-10-CM | POA: Diagnosis not present

## 2022-07-29 DIAGNOSIS — R3912 Poor urinary stream: Secondary | ICD-10-CM | POA: Diagnosis not present

## 2022-08-04 ENCOUNTER — Telehealth: Payer: Self-pay | Admitting: *Deleted

## 2022-08-04 ENCOUNTER — Telehealth: Payer: Self-pay | Admitting: Cardiology

## 2022-08-04 NOTE — Telephone Encounter (Signed)
   Pre-operative Risk Assessment    Patient Name: Robert [name]  DOB: July 18, 1946 MRN: [id number]      Request for Surgical Clearance    Procedure:   right knee replacement   Date of Surgery:  Clearance TBD                                 Surgeon:  Dr. Kavin Leech  Surgeon's Group or Practice Name:  [facility]  Phone number:  [phone] Fax number:  [phone]   Type of Clearance Requested:   - Medical  - Pharmacy:  Hold Aspirin TBD   Type of Anesthesia:  Spinal   Additional requests/questions:      SignedMilbert Coulter   08/04/2022, 8:43 AM

## 2022-08-04 NOTE — Telephone Encounter (Signed)
Pt has been scheduled a tele visit, 08/10/22 2:00.  Consent on file / medications reconciled.     Patient Consent for Virtual Visit        Robert [name] has provided verbal consent on 08/04/2022 for a virtual visit (video or telephone).   CONSENT FOR VIRTUAL VISIT FOR:  Robert [name]  By participating in this virtual visit I agree to the following:  I hereby voluntarily request, consent and authorize [facility] and its employed or contracted physicians, physician assistants, nurse practitioners or other licensed health care professionals (the Practitioner), to provide me with telemedicine health care services (the "Services") as deemed necessary by the treating Practitioner. I acknowledge and consent to receive the Services by the Practitioner via telemedicine. I understand that the telemedicine visit will involve communicating with the Practitioner through live audiovisual communication technology and the disclosure of certain medical information by electronic transmission. I acknowledge that I have been given the opportunity to request an in-person assessment or other available alternative prior to the telemedicine visit and am voluntarily participating in the telemedicine visit.  I understand that I have the right to withhold or withdraw my consent to the use of telemedicine in the course of my care at any time, without affecting my right to future care or treatment, and that the Practitioner or I may terminate the telemedicine visit at any time. I understand that I have the right to inspect all information obtained and/or recorded in the course of the telemedicine visit and may receive copies of available information for a reasonable fee.  I understand that some of the potential risks of receiving the Services via telemedicine include:  Delay or interruption in medical evaluation due to technological equipment failure or disruption; Information transmitted may not be sufficient (e.g.  poor resolution of images) to allow for appropriate medical decision making by the Practitioner; and/or  In rare instances, security protocols could fail, causing a breach of personal health information.  Furthermore, I acknowledge that it is my responsibility to provide information about my medical history, conditions and care that is complete and accurate to the best of my ability. I acknowledge that Practitioner's advice, recommendations, and/or decision may be based on factors not within their control, such as incomplete or inaccurate data provided by me or distortions of diagnostic images or specimens that may result from electronic transmissions. I understand that the practice of medicine is not an exact science and that Practitioner makes no warranties or guarantees regarding treatment outcomes. I acknowledge that a copy of this consent can be made available to me via my patient portal ([facility]), or I can request a printed copy by calling the office of [facility].    I understand that my insurance will be billed for this visit.   I have read or had this consent read to me. I understand the contents of this consent, which adequately explains the benefits and risks of the Services being provided via telemedicine.  I have been provided ample opportunity to ask questions regarding this consent and the Services and have had my questions answered to my satisfaction. I give my informed consent for the services to be provided through the use of telemedicine in my medical care

## 2022-08-04 NOTE — Telephone Encounter (Signed)
Pt has been scheduled a tele visit, 08/10/22 2:00.  Consent on file / medications reconciled.

## 2022-08-04 NOTE — Telephone Encounter (Signed)
   Name: Robert [name]  DOB: [birth year]  MRN: [id number]  Primary Cardiologist: Peter Martinique, MD  Chart reviewed as part of pre-operative protocol coverage. Because of [name]'s past medical history and time since last visit, he will require a follow-up telephone visit in order to better assess preoperative cardiovascular risk.  Pre-op covering staff: - Please schedule appointment and call patient to inform them. If patient already had an upcoming appointment within acceptable timeframe, please add "pre-op clearance" to the appointment notes so provider is aware. - Please contact requesting surgeon's office via preferred method (i.e, phone, fax) to inform them of need for appointment prior to surgery.  Patient with history of STEMI in 2012 with DES to proximal RCA, normal stress Myoview on follow-up in March 2015.  Per office protocol, can hold ASA x7 days prior to the procedure.  Please restart medically safe to do so.  [name], PA-C  08/04/2022, 2:37 PM

## 2022-08-08 ENCOUNTER — Encounter: Payer: Self-pay | Admitting: Neurology

## 2022-08-08 ENCOUNTER — Ambulatory Visit: Payer: Medicare HMO | Admitting: Neurology

## 2022-08-08 VITALS — BP 106/60 | HR 59 | Ht 73.0 in | Wt 214.0 lb

## 2022-08-08 DIAGNOSIS — G43711 Chronic migraine without aura, intractable, with status migrainosus: Secondary | ICD-10-CM | POA: Diagnosis not present

## 2022-08-08 MED ORDER — NURTEC 75 MG PO TBDP: 75.0000 mg | ORAL_TABLET | Freq: Every day | ORAL | 0 refills | Status: DC | PRN

## 2022-08-08 MED ORDER — EMGALITY 120 MG/ML ~~LOC~~ SOAJ: 120.0000 mg | SUBCUTANEOUS | 0 refills | Status: DC

## 2022-08-08 NOTE — Progress Notes (Signed)
[facility]    Provider:  Dr Jaynee Eagles Requesting Provider: Vivi Barrack, MD Primary Care Provider:  Vivi Barrack, MD  CC:  migraines  08/08/2022: he had botox a few weeks ago. He has been feeling pretty good. Emgality is helping again. Gave him some samples because it is 180 a month - but getting new insurance next month, contact us will new insurance and we can refill emgality and see if it helps. Discussed emgality patient assistance, he declines.  Patient complains of symptoms per HPI as well as the following symptoms: migraines . Pertinent negatives and positives per HPI. All others negative   12/29/2021 In December he was admitted for urosepsis, he was there on IV antibiotics, he is on oral antibiotics and still not feeling well. He was having headaches but likely due to the  infection. He is getting the botox at the [place], he does think the [employer] (insurance did not approve Ajovy) is helping quite a bit but we should check back in a few months due to all the problems he had had recently to get a better baseline.  He is getting the Emgality from the pharmacy. Still he feels the migraines are improved on Emgality in addition to the botox. I also spoke to his wife.  Patient complains of symptoms per HPI as well as the following symptoms: urosepsis . Pertinent negatives and positives per HPI. All others negative   HPI:  Robert [name] is a 76 y.o. male here as requested by Vivi Barrack, MD for migraines. He is on botox already and gets it completed every 9 weeks (I do not see any records on epic or "Care Everywhere" may be at the [place]).  He has been to pain management for cervical radiculopathy and chronic neck pain in the past and currently sees Dr. Ernestina Patches for low back pain and injections for pain management.  He has also seen other neurologists such as Dr. Tomi Likens at [facility] neurology in 2016 and [place].  He has a past medical history of coronary artery  disease, chronic intractable migraines, hyperlipidemia, hypertension, MI, OSA on CPAP, prediabetes, chronic dry eyes.  I reviewed Dr. 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  He has had migraines since the age of 20-19 lasted about 3 days went on years every 3 months. They have come closer and closer and now the he gets botox every 9 weeks. It helps reduce the pain by at least 50% but he has daily migraines.  He uses his cpap every night. He has migraines every day can last all day, no known trigers, not upon waking, tramadol helps a little, unknwon triggers. The whole top of the head hurts, his eyes hurt, pounding/pulsating/throbbing, light sensitivity, sound nausea, no vomiting, some days are worse than others.They can be severe. He takes daily tramadol (discussed this can make it worse). [facility] has [facility], goes to [facility]y at the [facility], saw Dr. Loretta Plume neurologist. Triptans contraindicated due to stroke. We had  along talk about options today.Wife here and provides much information. No other focal neurologic deficits, associated symptoms, inciting events or modifiable factors.  Reviewed notes, labs and imaging  from outside physicians, which showed:    CT head 10/2021: showed No acute intracranial abnormalities including mass lesion or mass effect, hydrocephalus, extra-axial fluid collection, midline shift, hemorrhage, or acute infarction, large ischemic events (personally reviewed images)    Mri brain/orbits 2020: IMPRESSION:reviewed images and agree (reviewed with patient and his wife as well.) 1. No acute intracranial abnormality. 2. Mild-to-moderate chronic small vessel  ischemic disease and small chronic left occipital infarct. 3. Negative orbital imaging.    From a thorough review of records, patient has tried the following medications that can be used in migraine management including Tylenol, aspirin, Flexeril, Plavix, Flexeril, Decadron injections, Valium, Aimovig, gabapentin, hydromorphone, ibuprofen, ketorolac injections, Robaxin, metoprolol, Zofran, oxycodone, tramadol, tizanidine, verapamil, topamax, amitriptyline.   Jhba1c 7.3 06/09/2021, tsh normal, cbc/cmp unremarkable  Review of Systems: Patient complains of symptoms per HPI as well as the following symptoms: sepsis admitted to the hospital . Pertinent negatives and positives per HPI. All others negative    Social History   Socioeconomic History   Marital status: Married    Spouse name: Not on file   Number of children: Not on file   Years of education: Not on file   Highest education level: 12th grade  Occupational History    Comment: Retired -[employer]  Tobacco Use   Smoking status: Former    Packs/day: 2.00    Years: 20.00    Total pack years: 40.00    Types: Cigarettes    Quit date: 11/14/1982    Years since quitting: 39.7   Smokeless tobacco: Never  Vaping Use   Vaping Use: Never used  Substance and Sexual Activity   Alcohol use: Not Currently    Comment: occ   Drug use: No   Sexual activity: Not on file  Other Topics Concern   Not on file  Social History Narrative   Pt lives with his wife. 1 and a half story home  No issues with stairs. 12th grade education   Social Determinants of Health   Financial Resource Strain: Low Risk  (09/02/2021)   Overall Financial Resource Strain (CARDIA)    Difficulty of Paying Living Expenses: Not hard at all  Food Insecurity: No Food Insecurity (09/02/2021)   Hunger Vital Sign    Worried About Running Out of Food in the Last Year: Never true    Ran Out of Food in the Last Year: Never true  Transportation Needs: No Transportation Needs  (09/02/2021)   PRAPARE - Hydrologist (Medical): No    Lack of Transportation (Non-Medical): No  Physical Activity: Inactive (09/02/2021)   Exercise Vital Sign    Days of Exercise per Week: 0 days    Minutes of Exercise per Session: 0 min  Stress: No Stress Concern Present (09/02/2021)   [place]    Feeling of Stress : Not at all  Social Connections: Moderately Integrated (09/02/2021)   Social Connection and Isolation Panel [NHANES]    Frequency of Communication with Friends and Family: More than three times a week    Frequency of Social Gatherings with Friends and Family: More than three times a week    Attends Religious Services: Never    Marine scientist or Organizations: Yes    Attends Archivist Meetings: 1 to 4 times per year    Marital Status: Married  Human resources officer Violence: Not At Risk (09/02/2021)   Humiliation, Afraid, Rape, and Kick questionnaire  Fear of Current or Ex-Partner: No    Emotionally Abused: No    Physically Abused: No    Sexually Abused: No    Family History  Problem Relation Age of Onset   Cancer Mother        lung   Heart disease Father        heart attack    Cancer Father        colon   Migraines Neg Hx    Headache Neg Hx     Past Medical History:  Diagnosis Date   Arthritis    CAD (coronary artery disease) 10/10/2011   NSTEMI with DES to the proximal RCA following flow wire evaluation. EF is normal.    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    "left"   Squamous cell carcinoma 12/17/2016    Patient Active Problem List    Diagnosis Date Noted   Acute medial meniscus tear of right knee 05/30/2022   Chronic migraine without aura, with intractable migraine, so stated, with status migrainosus 09/20/2021   Status post total replacement of left hip 05/14/2021   Type 2 diabetes mellitus with hyperglycemia ([place]) 05/23/2018   Chronically dry eyes, bilateral 05/23/2018   Migraines 11/28/2016   Hypertension associated with diabetes ([place]) 10/11/2011   History of basal cell carcinoma ([place]), right ear 03/03/2009   Hyperlipidemia associated with type 2 diabetes mellitus ([place]) 01/11/2008   Allergic rhinitis 01/07/2008   Prostatitis, recurrent 01/07/2008   History of gastric ulcer 01/07/2008   History of colon polyps, followed by Dr. Earlean Shawl 01/07/2008   GERD 04/02/2007   Sleep apnea 04/02/2007    Past Surgical History:  Procedure Laterality Date   BACK SURGERY     CATARACT EXTRACTION Bilateral 2019   CATARACT EXTRACTION W/ INTRAOCULAR LENS IMPLANT Bilateral 01/2019   CERVICAL DISC SURGERY  1980's?   CORONARY ANGIOPLASTY WITH STENT PLACEMENT  10/13/2011   DES to the proximal RCA. Dr Martinique   FRACTIONAL FLOW RESERVE WIRE Right 10/13/2011   Procedure: [place];  Surgeon: Peter M Martinique, MD;  Location: [facility] CATH LAB;  Service: Cardiovascular;  Laterality: Right;   HERNIA REPAIR     INGUINAL HERNIA REPAIR  10/23/2012   Procedure: HERNIA REPAIR INGUINAL ADULT;  Surgeon: Odis Hollingshead, MD;  Location: [place];  Service: General;  Laterality: Left;  Left lower quadrant; left inguinal hernia repair with mesh   INSERTION OF MESH  10/23/2012   Procedure: INSERTION OF MESH;  Surgeon: Odis Hollingshead, MD;  Location: [place];  Service: General;  Laterality: Left;  Left lower quadrant   LEFT HEART CATHETERIZATION WITH CORONARY ANGIOGRAM N/A 10/13/2011   Procedure: LEFT HEART CATHETERIZATION WITH CORONARY ANGIOGRAM;  Surgeon: Peter M Martinique, MD;  Location: [facility] CATH LAB;  Service: Cardiovascular;  Laterality: N/A;    PERCUTANEOUS CORONARY STENT INTERVENTION (PCI-S) Right 10/13/2011   Procedure: PERCUTANEOUS CORONARY STENT INTERVENTION (PCI-S);  Surgeon: Peter M Martinique, MD;  Location: [facility] CATH LAB;  Service: Cardiovascular;  Laterality: Right;   SHOULDER ARTHROSCOPY W/ ROTATOR CUFF REPAIR Left 1990's   TOTAL HIP ARTHROPLASTY  Left 05/14/2021   Procedure: LEFT TOTAL HIP ARTHROPLASTY ANTERIOR APPROACH;  Surgeon: Mcarthur Rossetti, MD;  Location: [facility];  Service: Orthopedics;  Laterality: Left;   TRIGGER FINGER RELEASE Left    3rd and 4th digits    Current Outpatient Medications  Medication Sig Dispense Refill   acetaminophen (TYLENOL) 500 MG tablet Take 1,000 mg by mouth every 6 (six) hours as needed for moderate pain.     Ascorbic Acid (VITAMIN C) 1000 MG tablet Take 1,000 mg by mouth daily.     ASPIRIN 81 PO Take 81 mg by mouth daily.     azelastine (ASTELIN) 0.1 % nasal spray Place 1 spray into both nostrils daily as needed for rhinitis.     B Complex-C (SUPER B COMPLEX PO) Take 1 tablet by mouth daily.     brimonidine (ALPHAGAN) 0.2 % ophthalmic solution Place 1 drop into the right eye in the morning and at bedtime.     dicyclomine (BENTYL) 10 MG capsule TAKE 1 CAPSULE (10 MG TOTAL) BY MOUTH 4 (FOUR) TIMES DAILY   BEFORE MEALS AND AT BEDTIME.     EMGALITY 120 MG/ML SOAJ INJECT 120 MG INTO THE SKIN EVERY 30 (THIRTY) DAYS. 1 mL 11   ezetimibe (ZETIA) 10 MG tablet Take 0.5 tablets by mouth daily.     fluticasone (FLONASE) 50 MCG/ACT nasal spray Place 1 spray into both nostrils daily as needed for allergies.     Galcanezumab-gnlm (EMGALITY) 120 MG/ML SOAJ Inject 120 mg into the skin every 30 (thirty) days. 4 mL 0   latanoprost (XALATAN) 0.005 % ophthalmic solution Place 1 drop into the right eye at bedtime.     metFORMIN (GLUCOPHAGE) 500 MG tablet Take 1 tablet (500 mg total) by mouth daily with breakfast. 30 tablet 3   metoprolol tartrate (LOPRESSOR) 25 MG tablet Take 12.5 mg by mouth daily.      Multiple Vitamins-Minerals (MULTIVITAMINS THER. W/MINERALS) TABS Take 1 tablet by mouth daily.     NURTEC 75 MG TBDP TAKE 75 MG BY MOUTH DAILY AS NEEDED. FOR MIGRAINES. TAKE AS CLOSE TO ONSET OF MIGRAINE AS POSSIBLE. ONE DAILY MAXIMUM. (Patient taking differently: Take 75 mg by mouth daily as needed (migraine).) 10 tablet 6   pantoprazole (PROTONIX) 40 MG tablet Take 40 mg by mouth daily.     prednisoLONE acetate (PRED FORTE) 1 % ophthalmic suspension      Rimegepant Sulfate (NURTEC) 75 MG TBDP Take 75 mg by mouth daily as needed. For migraines. Take as close to onset of migraine as possible. One daily maximum. 2 tablet 0   rosuvastatin (CRESTOR) 40 MG tablet Take 40 mg by mouth daily.     tamsulosin (FLOMAX) 0.4 MG CAPS capsule TAKE 1 CAPSULE BY MOUTH EVERY DAY 30 capsule 1   traMADol (ULTRAM) 50 MG tablet TAKE TWO TABLETS BY MOUTH FOUR TIMES A DAY AS NEEDED FOR RELIEF OF MIGRAINE     vitamin E 180 MG (400 UNITS) capsule Take 400 Units by mouth daily.     zinc gluconate 50 MG tablet Take 50 mg by mouth daily.     nitroGLYCERIN (NITROSTAT) 0.4 MG SL tablet Place 1 tablet (0.4 mg total) under the tongue every 5 (five) minutes as needed for chest pain. Do not exceed more than 3 doses per day. 25 tablet 11   No current facility-administered medications for this visit.    Allergies as of 08/08/2022 - Review Complete 08/08/2022  Allergen Reaction Noted   Sumatriptan Shortness Of  Breath 01/07/2008   Penicillins Itching    Brinzolamide-brimonidine  05/10/2022   Lipitor [atorvastatin] Nausea And Vomiting 12/09/2021   Oxycodone Itching 11/21/2012    Vitals: BP 106/60   Pulse (!) 59   Ht '6\' 1"'$  (1.854 m)   Wt 214 lb (97.1 kg)   BMI 28.23 kg/m  Last Weight:  Wt Readings from Last 1 Encounters:  08/08/22 214 lb (97.1 kg)   Last Height:   Ht Readings from Last 1 Encounters:  08/08/22 '6\' 1"'$  (1.854 m)   Exam: NAD, pleasant                  Speech:    Speech is normal; fluent and spontaneous  with normal comprehension.  Cognition:    The patient is oriented to person, place, and time;     recent and remote memory intact;     language fluent;    Cranial Nerves:    The pupils are equal, round, and reactive to light.Trigeminal sensation is intact and the muscles of mastication are normal. The face is symmetric. The palate elevates in the midline. Hearing intact. Voice is normal. Shoulder shrug is normal. The tongue has normal motion without fasciculations.   Coordination:  No dysmetria  Motor Observation:    No asymmetry, no atrophy, and no involuntary movements noted. Tone:    Normal muscle tone.     Strength:    Strength is V/V in the upper and lower limbs.      Sensation: intact to LT     Assessment/Plan:  76 y.o. male here as requested by Vivi Barrack, MD for migraines. He is on botox already and gets it completed every 9 weeks (I do not see any records on epic or "Care Everywhere" may be at the [place]).  He has been to pain management for cervical radiculopathy and chronic neck pain in the past and currently sees Dr. Ernestina Patches for low back pain and injections for pain management.  He has also seen other neurologists such as Dr. Tomi Likens at [facility] neurology in 2016 and [place].  He has a past medical history of coronary artery disease, chronic intractable migraines, hyperlipidemia, hypertension, MI, OSA on CPAP, prediabetes, chronic dry eyes.  I reviewed Dr. 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 he had botox a few weeks ago. He has been feeling pretty good. Emgality is helping again. Gave him some samples because it is $180 a  month - but getting new insurance next YEAR, contact us will new insurance and we can refill emgality and see if it helps. Discussed emgality patient assistance, he declines. Doing better wSnce urosepsis.   - 09/20/2022: In December he was admitted for urosepsis, he was there on IV antibiotics, he is on oral antibiotics and still not feeling well. He was having headaches but likely due to the  infection. He is getting the botox at the [place], he does think the [employer] (insurance did not approve Aj[employer]) is helping quite a bit but we should check back in a few months due to all the problems he had had recently to get a better baseline.  He is getting  the Emgality from the pharmacy. Still he feels the migraines are improved on Emgality in addition to the botox. I also spoke to his wife  - Triptans contraindicated, encephalomalacia in the left occipital lobe, unclear if old stroke. Nurtec.   Meds ordered this encounter  Medications   Galcanezumab-gnlm (EMGALITY) 120 MG/ML SOAJ    Sig: Inject 120 mg into the skin every 30 (thirty) days.    Dispense:  4 mL    Refill:  0   Rimegepant Sulfate (NURTEC) 75 MG TBDP    Sig: Take 75 mg by mouth daily as needed. For migraines. Take as close to onset of migraine as possible. One daily maximum.    Dispense:  2 tablet    Refill:  0     Cc: Vivi Barrack, MD,  Vivi Barrack, MD  Sarina Ill, MD  [facility] [address] [place], [place] [address]  Phone [phone] Fax [phone]  I spent OVER 20 minutes of face-to-face and non-face-to-face time with patient on the  1. Chronic migraine without aura, with intractable migraine, so stated, with status migrainosus    diagnosis.  This included previsit chart review, lab review, study review, order entry, electronic health record documentation, patient education on the different diagnostic and therapeutic options, counseling and coordination of care, risks and benefits of  management, compliance, or risk factor reduction

## 2022-08-08 NOTE — Patient Instructions (Signed)
CONTINUE EMGALITY, CALL us WITH NEW INSURANCE

## 2022-08-09 ENCOUNTER — Telehealth: Payer: Self-pay | Admitting: Family Medicine

## 2022-08-09 ENCOUNTER — Other Ambulatory Visit: Payer: Self-pay | Admitting: *Deleted

## 2022-08-09 DIAGNOSIS — E1169 Type 2 diabetes mellitus with other specified complication: Secondary | ICD-10-CM

## 2022-08-09 NOTE — Telephone Encounter (Signed)
A1C lab order ok per Dr Jerline Pain  Patient aware, lab appointment schedule

## 2022-08-09 NOTE — Telephone Encounter (Signed)
Patient requests to have an Order placed for A1C labs - Patient states his blood sugars are doing well enough for surgery.  Patient requests to be called at ph# [phone] to be advised.

## 2022-08-10 ENCOUNTER — Ambulatory Visit: Payer: Medicare HMO | Attending: Cardiology | Admitting: Nurse Practitioner

## 2022-08-10 ENCOUNTER — Other Ambulatory Visit (INDEPENDENT_AMBULATORY_CARE_PROVIDER_SITE_OTHER): Payer: Medicare HMO

## 2022-08-10 DIAGNOSIS — Z01810 Encounter for preprocedural cardiovascular examination: Secondary | ICD-10-CM

## 2022-08-10 DIAGNOSIS — E785 Hyperlipidemia, unspecified: Secondary | ICD-10-CM | POA: Diagnosis not present

## 2022-08-10 DIAGNOSIS — E1169 Type 2 diabetes mellitus with other specified complication: Secondary | ICD-10-CM | POA: Diagnosis not present

## 2022-08-10 LAB — HEMOGLOBIN A1C: Hgb A1c MFr Bld: 8.5 % — ABNORMAL HIGH (ref 4.6–6.5)

## 2022-08-10 NOTE — Progress Notes (Signed)
Virtual Visit via Telephone Note   Because of Robert [name]'s co-morbid illnesses, he is at least at moderate risk for complications without adequate follow up.  This format is felt to be most appropriate for this patient at this time.  The patient did not have access to video technology/had technical difficulties with video requiring transitioning to audio format only (telephone).  All issues noted in this document were discussed and addressed.  No physical exam could be performed with this format.  Please refer to the patient's chart for his consent to telehealth for [name]  Evaluation Performed:  Preoperative cardiovascular risk assessment _____________   Date:  08/10/2022   Patient ID:  Robert [name], DOB [birth year], MRN [id number] Patient Location:  Home Provider location:   Office  Primary Care Provider:  Vivi Barrack, MD Primary Cardiologist:  Peter Martinique, MD  Chief Complaint / Patient Profile   76 y.o. 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 Kavin Leech and presents today for telephonic preoperative cardiovascular risk assessment.  Past Medical History    Past Medical History:  Diagnosis Date   Arthritis    CAD (coronary artery disease) 10/10/2011   NSTEMI with DES to the proximal RCA following flow wire evaluation. EF is normal.    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   CORONARY ANGIOPLASTY WITH STENT PLACEMENT  10/13/2011   DES to the proximal RCA. Dr Martinique   FRACTIONAL FLOW RESERVE WIRE Right 10/13/2011   Procedure: [place];  Surgeon: Peter M Martinique, MD;  Location: [facility] CATH LAB;  Service: Cardiovascular;  Laterality: Right;   HERNIA REPAIR     INGUINAL HERNIA REPAIR  10/23/2012   Procedure: HERNIA REPAIR INGUINAL ADULT;  Surgeon: Odis Hollingshead, MD;  Location: [place];  Service: General;  Laterality: Left;  Left lower quadrant; left inguinal hernia repair with mesh   INSERTION OF MESH  10/23/2012   Procedure: INSERTION OF MESH;  Surgeon: Odis Hollingshead, MD;  Location: [place];  Service: General;  Laterality: Left;  Left lower quadrant   LEFT HEART CATHETERIZATION WITH CORONARY ANGIOGRAM N/A 10/13/2011   Procedure: LEFT HEART CATHETERIZATION WITH CORONARY ANGIOGRAM;  Surgeon: Peter M Martinique, MD;  Location: [facility] CATH LAB;  Service: Cardiovascular;  Laterality: N/A;   PERCUTANEOUS CORONARY STENT INTERVENTION (PCI-S) Right 10/13/2011   Procedure: PERCUTANEOUS CORONARY STENT INTERVENTION (PCI-S);  Surgeon: Peter M Martinique, MD;  Location: [facility] CATH LAB;  Service: Cardiovascular;  Laterality: Right;   SHOULDER ARTHROSCOPY W/ ROTATOR CUFF REPAIR Left 1990's   TOTAL HIP ARTHROPLASTY Left 05/14/2021   Procedure: LEFT TOTAL HIP ARTHROPLASTY ANTERIOR  APPROACH;  Surgeon: Mcarthur Rossetti, MD;  Location: [facility] ORS;  Service: Orthopedics;  Laterality: Left;   TRIGGER FINGER RELEASE Left    3rd and 4th digits    Allergies  Allergies  Allergen Reactions   Sumatriptan Shortness Of Breath    REACTION: sob, headache scalp sensitivity   Penicillins Itching   Brinzolamide-Brimonidine     Other reaction(s): Itching of eye   Lipitor [Atorvastatin] Nausea And Vomiting   Oxycodone  Itching    History of Present Illness    Robert [name] is a 76 y.o. male who presents via audio/video conferencing for a telehealth visit today.  Pt was last seen in cardiology clinic on 3/3/023 by [name], NP. He was seen virtually on 06/24/2022 by [name], PA, for preoperative evaluation.  At that time [name] was doing well.  The patient is now pending procedure as outlined above. Since his last visit, he has done well from a cardiac standpoint. He denies chest pain, palpitations, dyspnea, pnd, orthopnea, n, v, dizziness, syncope, edema, weight gain, or early satiety. All other systems reviewed and are otherwise negative except as noted above.   Home Medications    Prior to Admission medications   Medication Sig Start Date End Date Taking? Authorizing Provider  acetaminophen (TYLENOL) 500 MG tablet Take 1,000 mg by mouth every 6 (six) hours as needed for moderate pain.    [provider]  Ascorbic Acid (VITAMIN C) 1000 MG tablet Take 1,000 mg by mouth daily.    [provider]  ASPIRIN 81 PO Take 81 mg by mouth daily.    [provider]  azelastine (ASTELIN) 0.1 % nasal spray Place 1 spray into both nostrils daily as needed for rhinitis.    [provider]  B Complex-C (SUPER B COMPLEX PO) Take 1 tablet by mouth daily.    [provider]  brimonidine (ALPHAGAN) 0.2 % ophthalmic solution Place 1 drop into the right eye in the morning and at bedtime.    [provider]  dicyclomine (BENTYL) 10 MG capsule TAKE 1 CAPSULE (10 MG TOTAL) BY MOUTH 4 (FOUR) TIMES DAILY   BEFORE MEALS AND AT BEDTIME.    [provider]  EMGALITY 120 MG/ML SOAJ INJECT 120 MG INTO THE SKIN EVERY 30 (THIRTY) DAYS. 09/23/21   [name], MD  ezetimibe (ZETIA) 10 MG tablet Take 0.5 tablets by mouth daily. 01/11/22   [provider]  fluticasone (FLONASE) 50 MCG/ACT nasal spray Place 1 spray into both nostrils daily as needed for  allergies.    [provider]  Galcanezumab-gnlm (EMGALITY) 120 MG/ML SOAJ Inject 120 mg into the skin every 30 (thirty) days. 08/08/22   [name], MD  latanoprost (XALATAN) 0.005 % ophthalmic solution Place 1 drop into the right eye at bedtime.    [provider]  metFORMIN (GLUCOPHAGE) 500 MG tablet Take 1 tablet (500 mg total) by mouth daily with breakfast. 06/18/22   Vivi Barrack, MD  metoprolol tartrate (LOPRESSOR) 25 MG tablet Take 12.5 mg by mouth daily.    [provider]  Multiple Vitamins-Minerals (MULTIVITAMINS THER. W/MINERALS) TABS Take 1 tablet by mouth daily.    [provider]  nitroGLYCERIN (NITROSTAT) 0.4 MG SL tablet Place 1 tablet (0.4 mg total) under the tongue every 5 (five) minutes as needed for chest pain. Do not exceed more than 3 doses per day. 05/20/21 08/04/22  Martinique, Peter M, MD  NURTEC 75 MG TBDP  TAKE 75 MG BY MOUTH DAILY AS NEEDED. FOR MIGRAINES. TAKE AS CLOSE TO ONSET OF MIGRAINE AS POSSIBLE. ONE DAILY MAXIMUM. Patient taking differently: Take 75 mg by mouth daily as needed (migraine). 09/20/21   [name], MD  pantoprazole (PROTONIX) 40 MG tablet Take 40 mg by mouth daily. 01/28/21   [provider]  prednisoLONE acetate (PRED FORTE) 1 % ophthalmic suspension     [provider]  Rimegepant Sulfate (NURTEC) 75 MG TBDP Take 75 mg by mouth daily as needed. For migraines. Take as close to onset of migraine as possible. One daily maximum. 08/08/22   [name], MD  rosuvastatin (CRESTOR) 40 MG tablet Take 40 mg by mouth daily. 01/28/21   [provider]  tamsulosin (FLOMAX) 0.4 MG CAPS capsule TAKE 1 CAPSULE BY MOUTH EVERY DAY 03/07/22   Inda Coke, PA  traMADol (ULTRAM) 50 MG tablet TAKE TWO TABLETS BY MOUTH FOUR TIMES A DAY AS NEEDED FOR RELIEF OF MIGRAINE 01/11/22   [provider]  vitamin E 180 MG (400 UNITS) capsule Take 400 Units by mouth daily.    [provider]   zinc gluconate 50 MG tablet Take 50 mg by mouth daily.    [provider]    Physical Exam    Vital Signs:  DEMARKUS REMMEL does not have vital signs available for review today.  Given telephonic nature of communication, physical exam is limited. AAOx3. NAD. Normal affect.  Speech and respirations are unlabored.  Accessory Clinical Findings    None  Assessment & Plan    1.  Preoperative Cardiovascular Risk Assessment:  According to the Revised Cardiac Risk Index (RCRI), his Perioperative Risk of Major Cardiac Event is (%): 6.6. His Functional Capacity in METs is: 6.36 according to the Duke Activity Status Index (DASI). Therefore, based on ACC/AHA guidelines, patient would be at acceptable risk for the planned procedure without further cardiovascular testing.  The patient was advised that if he develops new symptoms prior to surgery to contact our office to arrange for a follow-up visit, and he verbalized understanding.  Per office protocol, can hold ASA x7 days prior to the procedure.  Please resume aspirin as soon as possible postprocedure, at the discretion of the surgeon.  A copy of this note will be routed to requesting surgeon.  Time:   Today, I have spent 5 minutes with the patient with telehealth technology discussing medical history, symptoms, and management plan.     Lenna Sciara, NP  08/10/2022, 2:11 PM

## 2022-08-11 NOTE — Progress Notes (Signed)
Please inform patient of the following:  His A1c is a little bit better but still not at goal.  Can we verify that he has been taking metformin?  If so recommend he increase to 500 mg twice daily and we can recheck in 3 months.  Algis Greenhouse. Jerline Pain, MD 08/11/2022 12:38 PM

## 2022-08-15 ENCOUNTER — Telehealth: Payer: Medicare HMO

## 2022-08-15 ENCOUNTER — Ambulatory Visit: Payer: Medicare HMO | Admitting: Urology

## 2022-09-15 ENCOUNTER — Ambulatory Visit (INDEPENDENT_AMBULATORY_CARE_PROVIDER_SITE_OTHER): Payer: Medicare HMO

## 2022-09-15 VITALS — Wt 214.0 lb

## 2022-09-15 DIAGNOSIS — Z0000 Encounter for general adult medical examination without abnormal findings: Secondary | ICD-10-CM | POA: Diagnosis not present

## 2022-09-15 NOTE — Patient Instructions (Signed)
[name] , Thank you for taking time to come for your Medicare Wellness Visit. I appreciate your ongoing commitment to your health goals. Please review the following plan we discussed and let me know if I can assist you in the future.   These are the goals we discussed:  Goals      Diabetes-Lifestyle Change     Timeframe:  Long-Range Goal Priority:  High Start Date: 07/13/21                            Expected End Date: 02/30/23                       Follow Up Date 10/13/21    - agree to work together to make changes - dietary modifications to lower sugar    Why is this important?   The changes that you are asked to make may be hard to do.  This is especially true when the changes are life-long.  Knowing why it is important to you is the first step.  Working on the change with your family or support person helps you not feel alone.  Reward yourself and family or support person when goals are met. This can be an activity you choose like bowling, hiking, biking, swimming or shooting hoops.     Notes:      Maintain current health     Patient Stated     Stay alive      Patient Stated     Get A1C under control      [place] (see longitudinal plan of care for additional care plan information)  Current Barriers:  Chronic Disease Management support, education, and care coordination needs related to Hypertension, Hyperlipidemia, and Diabetes   Hypertension BP Readings from Last 3 Encounters:  06/09/20 (!) 124/58  05/27/20 100/60  11/27/19 118/60  Pharmacist Clinical Goal(s): Over the next 365 days, patient will work with PharmD and providers to maintain BP goal <130/80 Current regimen:  Metoprolol tartrate 12.5 mg once daily Interventions: Diet/exercise review - Maintain a healthy weight and exercise regularly, as directed by your health care provider. Eat healthy foods, such as: Lean proteins, complex carbohydrates, fresh fruits and vegetables,  low-fat dairy products, healthy fats. Patient self care activities - Over the next 365 days, patient will: Check BP at least once every 1-2 weeks, document, and provide at future appointments Ensure daily salt intake < 2300 mg/day  Hyperlipidemia Lab Results  Component Value Date/Time   LDLCALC 59 06/09/2020 10:54 AM   LDLDIRECT 137.0 03/31/2010 08:08 AM  Pharmacist Clinical Goal(s): Over the next 180 days, patient will work with PharmD and providers to maintain LDL goal < 70 Current regimen:  Rosuvastatin 40 mg once daily  Zetia 10 mg once daily  Interventions: Side effect review - no problems noted Patient self care activities - Over the next 365 days, patient will: Continue current management  Diabetes Lab Results  Component Value Date/Time   HGBA1C 6.9 (H) 06/09/2020 10:54 AM   HGBA1C 6.5 (A) 05/27/2020 09:25 AM   HGBA1C 6.1 (A) 05/29/2019 10:10 AM   HGBA1C 6.7 (H) 01/26/2018 03:55 PM  Pharmacist Clinical Goal(s): Over the next 180 days, patient will work with PharmD and providers to achieve A1c goal <6.5% Current regimen:  N/a Interventions: Reviewed goals for a1c and fasting blood sugar Patient self care activities - Over  the next 180 days, patient will: Check blood sugar if directed, document, and provide at future appointments Contact provider with any episodes of hypoglycemia  Medication management Pharmacist Clinical Goal(s): Over the next 365 days, patient will work with PharmD and providers to maintain optimal medication adherence Current pharmacy: [facility] Interventions Comprehensive medication review performed. Continue current medication management strategy Patient self care activities - Over the next 365 days, patient will: Focus on medication adherence by taking medications as prescribed Report any questions or concerns to PharmD and/or provider(s) Initial goal documentation.        This is a list of the screening recommended for you and due  dates:  Health Maintenance  Topic Date Due   Eye exam for diabetics  11/14/2019   Complete foot exam   05/28/2020   Hemoglobin A1C  02/08/2023   Yearly kidney function blood test for diabetes  06/16/2023   Yearly kidney health urinalysis for diabetes  06/16/2023   Medicare Annual Wellness Visit  09/16/2023   Tetanus Vaccine  12/01/2027   Pneumonia Vaccine  Completed   Hepatitis C Screening: USPSTF Recommendation to screen - Ages 82-79 yo.  Completed   Zoster (Shingles) Vaccine  Completed   HPV Vaccine  Aged Out   Flu Shot  Discontinued   Colon Cancer Screening  Discontinued   COVID-19 Vaccine  Discontinued    Advanced directives: Please bring a copy of your health care power of attorney and living will to the office at your convenience.  Conditions/risks identified: get A1C under control   Next appointment: Follow up in one year for your annual wellness visit.   Preventive Care 76 Years and Older, Male  Preventive care refers to lifestyle choices and visits with your health care provider that can promote health and wellness. What does preventive care include? A yearly physical exam. This is also called an annual well check. Dental exams once or twice a year. Routine eye exams. Ask your health care provider how often you should have your eyes checked. Personal lifestyle choices, including: Daily care of your teeth and gums. Regular physical activity. Eating a healthy diet. Avoiding tobacco and drug use. Limiting alcohol use. Practicing safe sex. Taking low doses of aspirin every day. Taking vitamin and mineral supplements as recommended by your health care provider. What happens during an annual well check? The services and screenings done by your health care provider during your annual well check will depend on your age, overall health, lifestyle risk factors, and family history of disease. Counseling  Your health care provider may ask you questions about your: Alcohol  use. Tobacco use. Drug use. Emotional well-being. Home and relationship well-being. Sexual activity. Eating habits. History of falls. Memory and ability to understand (cognition). Work and work Statistician. Screening  You may have the following tests or measurements: Height, weight, and BMI. Blood pressure. Lipid and cholesterol levels. These may be checked every 5 years, or more frequently if you are over 44 years old. Skin check. Lung cancer screening. You may have this screening every year starting at age 62 if you have a 30-pack-year history of smoking and currently smoke or have quit within the past 15 years. Fecal occult blood test (FOBT) of the stool. You may have this test every year starting at age 36. Flexible sigmoidoscopy or colonoscopy. You may have a sigmoidoscopy every 5 years or a colonoscopy every 10 years starting at age 29. Prostate cancer screening. Recommendations will vary depending on your family history and  other risks. Hepatitis C blood test. Hepatitis B blood test. Sexually transmitted disease (STD) testing. Diabetes screening. This is done by checking your blood sugar (glucose) after you have not eaten for a while (fasting). You may have this done every 1-3 years. Abdominal aortic aneurysm (AAA) screening. You may need this if you are a current or former smoker. Osteoporosis. You may be screened starting at age 30 if you are at high risk. Talk with your health care provider about your test results, treatment options, and if necessary, the need for more tests. Vaccines  Your health care provider may recommend certain vaccines, such as: Influenza vaccine. This is recommended every year. Tetanus, diphtheria, and acellular pertussis (Tdap, Td) vaccine. You may need a Td booster every 10 years. Zoster vaccine. You may need this after age 35. Pneumococcal 13-valent conjugate (PCV13) vaccine. One dose is recommended after age 50. Pneumococcal polysaccharide  (PPSV23) vaccine. One dose is recommended after age 64. Talk to your health care provider about which screenings and vaccines you need and how often you need them. This information is not intended to replace advice given to you by your health care provider. Make sure you discuss any questions you have with your health care provider. Document Released: 11/27/2015 Document Revised: 07/20/2016 Document Reviewed: 09/01/2015 Elsevier Interactive Patient Education  2017 [place] Prevention in the Home Falls can cause injuries. They can happen to people of all ages. There are many things you can do to make your home safe and to help prevent falls. What can I do on the outside of my home? Regularly fix the edges of walkways and driveways and fix any cracks. Remove anything that might make you trip as you walk through a door, such as a raised step or threshold. Trim any bushes or trees on the path to your home. Use bright outdoor lighting. Clear any walking paths of anything that might make someone trip, such as rocks or tools. Regularly check to see if handrails are loose or broken. Make sure that both sides of any steps have handrails. Any raised decks and porches should have guardrails on the edges. Have any leaves, snow, or ice cleared regularly. Use sand or salt on walking paths during winter. Clean up any spills in your garage right away. This includes oil or grease spills. What can I do in the bathroom? Use night lights. Install grab bars by the toilet and in the tub and shower. Do not use towel bars as grab bars. Use non-skid mats or decals in the tub or shower. If you need to sit down in the shower, use a plastic, non-slip stool. Keep the floor dry. Clean up any water that spills on the floor as soon as it happens. Remove soap buildup in the tub or shower regularly. Attach bath mats securely with double-sided non-slip rug tape. Do not have throw rugs and other things on the  floor that can make you trip. What can I do in the bedroom? Use night lights. Make sure that you have a light by your bed that is easy to reach. Do not use any sheets or blankets that are too big for your bed. They should not hang down onto the floor. Have a firm chair that has side arms. You can use this for support while you get dressed. Do not have throw rugs and other things on the floor that can make you trip. What can I do in the kitchen? Clean up any spills right  away. Avoid walking on wet floors. Keep items that you use a lot in easy-to-reach places. If you need to reach something above you, use a strong step stool that has a grab bar. Keep electrical cords out of the way. Do not use floor polish or wax that makes floors slippery. If you must use wax, use non-skid floor wax. Do not have throw rugs and other things on the floor that can make you trip. What can I do with my stairs? Do not leave any items on the stairs. Make sure that there are handrails on both sides of the stairs and use them. Fix handrails that are broken or loose. Make sure that handrails are as long as the stairways. Check any carpeting to make sure that it is firmly attached to the stairs. Fix any carpet that is loose or worn. Avoid having throw rugs at the top or bottom of the stairs. If you do have throw rugs, attach them to the floor with carpet tape. Make sure that you have a light switch at the top of the stairs and the bottom of the stairs. If you do not have them, ask someone to add them for you. What else can I do to help prevent falls? Wear shoes that: Do not have high heels. Have rubber bottoms. Are comfortable and fit you well. Are closed at the toe. Do not wear sandals. If you use a stepladder: Make sure that it is fully opened. Do not climb a closed stepladder. Make sure that both sides of the stepladder are locked into place. Ask someone to hold it for you, if possible. Clearly mark and make  sure that you can see: Any grab bars or handrails. First and last steps. Where the edge of each step is. Use tools that help you move around (mobility aids) if they are needed. These include: Canes. Walkers. Scooters. Crutches. Turn on the lights when you go into a dark area. Replace any light bulbs as soon as they burn out. Set up your furniture so you have a clear path. Avoid moving your furniture around. If any of your floors are uneven, fix them. If there are any pets around you, be aware of where they are. Review your medicines with your doctor. Some medicines can make you feel dizzy. This can increase your chance of falling. Ask your doctor what other things that you can do to help prevent falls. This information is not intended to replace advice given to you by your health care provider. Make sure you discuss any questions you have with your health care provider. Document Released: 08/27/2009 Document Revised: 04/07/2016 Document Reviewed: 12/05/2014 Elsevier Interactive Patient Education  2017 [employer].

## 2022-09-15 NOTE — Progress Notes (Addendum)
I connected with  [name] on 09/15/22 by a audio enabled telemedicine application and verified that I am speaking with the correct person using two identifiers.  Patient Location: Home  Provider Location: Office/Clinic  I discussed the limitations of evaluation and management by telemedicine. The patient expressed understanding and agreed to proceed.   Subjective:   Robert [name] is a 76 y.o. male who presents for Medicare Annual/Subsequent preventive examination.  Review of Systems     Cardiac Risk Factors include: advanced age (>98mn, >>67women);hypertension;dyslipidemia;male gender;diabetes mellitus     Objective:    Today's Vitals   09/15/22 0922  Weight: 214 lb (97.1 kg)   Body mass index is 28.23 kg/m.     09/15/2022    9:28 AM 06/29/2022    2:08 PM 03/31/2022    1:38 PM 02/25/2022   10:10 AM 11/05/2021   12:40 AM 11/04/2021   11:36 AM 09/02/2021    8:15 AM  Advanced Directives  Does Patient Have a Medical Advance Directive? Yes Yes Yes Yes  No Yes  Type of AParamedicof [place]ansdaleLiving will [place]ott[place]Living will H[place]Living will [place]Living will   HClarenceLiving will  Does patient want to make changes to medical advance directive?  No - Patient declined No - Patient declined      Copy of HV[place] Chart? No - copy requested No - copy requested No - copy requested    No - copy requested  Would patient like information on creating a medical advance directive?  No - Patient declined   No - Patient declined      Current Medications (verified) Outpatient Encounter Medications as of 09/15/2022  Medication Sig   acetaminophen (TYLENOL) 500 MG tablet Take 1,000 mg by mouth every 6 (six) hours as needed for moderate pain.   Ascorbic Acid (VITAMIN C) 1000 MG tablet Take 1,000 mg by mouth daily.   ASPIRIN 81 PO Take 81 mg by mouth daily.    azelastine (ASTELIN) 0.1 % nasal spray Place 1 spray into both nostrils daily as needed for rhinitis.   B Complex-C (SUPER B COMPLEX PO) Take 1 tablet by mouth daily.   brimonidine (ALPHAGAN) 0.2 % ophthalmic solution Place 1 drop into the right eye in the morning and at bedtime.   dicyclomine (BENTYL) 10 MG capsule TAKE 1 CAPSULE (10 MG TOTAL) BY MOUTH 4 (FOUR) TIMES DAILY   BEFORE MEALS AND AT BEDTIME.   EMGALITY 120 MG/ML SOAJ INJECT 120 MG INTO THE SKIN EVERY 30 (THIRTY) DAYS.   ezetimibe (ZETIA) 10 MG tablet Take 0.5 tablets by mouth daily.   fluticasone (FLONASE) 50 MCG/ACT nasal spray Place 1 spray into both nostrils daily as needed for allergies.   latanoprost (XALATAN) 0.005 % ophthalmic solution Place 1 drop into the right eye at bedtime.   metFORMIN (GLUCOPHAGE) 500 MG tablet Take 1 tablet (500 mg total) by mouth daily with breakfast.   metoprolol tartrate (LOPRESSOR) 25 MG tablet Take 12.5 mg by mouth daily.   Multiple Vitamins-Minerals (MULTIVITAMINS THER. W/MINERALS) TABS Take 1 tablet by mouth daily.   NURTEC 75 MG TBDP TAKE 75 MG BY MOUTH DAILY AS NEEDED. FOR MIGRAINES. TAKE AS CLOSE TO ONSET OF MIGRAINE AS POSSIBLE. ONE DAILY MAXIMUM. (Patient taking differently: Take 75 mg by mouth daily as needed (migraine).)   pantoprazole (PROTONIX) 40 MG tablet Take 40 mg by mouth daily.  prednisoLONE acetate (PRED FORTE) 1 % ophthalmic suspension    rosuvastatin (CRESTOR) 40 MG tablet Take 40 mg by mouth daily.   tamsulosin (FLOMAX) 0.4 MG CAPS capsule TAKE 1 CAPSULE BY MOUTH EVERY DAY   traMADol (ULTRAM) 50 MG tablet TAKE TWO TABLETS BY MOUTH FOUR TIMES A DAY AS NEEDED FOR RELIEF OF MIGRAINE   vitamin E 180 MG (400 UNITS) capsule Take 400 Units by mouth daily.   zinc gluconate 50 MG tablet Take 50 mg by mouth daily.   nitroGLYCERIN (NITROSTAT) 0.4 MG SL tablet Place 1 tablet (0.4 mg total) under the tongue every 5 (five) minutes as needed for chest pain. Do not exceed more than 3 doses per  day.   [DISCONTINUED] Galcanezumab-gnlm (EMGALITY) 120 MG/ML SOAJ Inject 120 mg into the skin every 30 (thirty) days.   [DISCONTINUED] Rimegepant Sulfate (NURTEC) 75 MG TBDP Take 75 mg by mouth daily as needed. For migraines. Take as close to onset of migraine as possible. One daily maximum.   No facility-administered encounter medications on file as of 09/15/2022.    Allergies (verified) Sumatriptan, Penicillins, Brinzolamide-brimonidine, Lipitor [atorvastatin], and Oxycodone   History: Past Medical History:  Diagnosis Date   Arthritis    CAD (coronary artery disease) 10/10/2011   NSTEMI with DES to the proximal RCA following flow wire evaluation. EF is normal.    GERD (gastroesophageal reflux disease)    H/O esophageal ulcer    History of stomach ulcers    Hyperlipidemia    Hypertension    Migraines    "quite often; maybe q 10d to 2 wk" (01/14/2014)   Myocardial infarction ([place]) 10/2011   OSA on CPAP    "wear it most of the time" (01/14/2014)   Pre-diabetes    Squamous cell cancer of skin of earlobe    "left"   Squamous cell carcinoma 12/17/2016   Past Surgical History:  Procedure Laterality Date   BACK SURGERY     CATARACT EXTRACTION Bilateral 2019   CATARACT EXTRACTION W/ INTRAOCULAR LENS IMPLANT Bilateral 01/2019   CERVICAL DISC SURGERY  1980's?   CORONARY ANGIOPLASTY WITH STENT PLACEMENT  10/13/2011   DES to the proximal RCA. Dr Martinique   FRACTIONAL FLOW RESERVE WIRE Right 10/13/2011   Procedure: [place];  Surgeon: Peter M Martinique, MD;  Location: [facility] CATH LAB;  Service: Cardiovascular;  Laterality: Right;   HERNIA REPAIR     INGUINAL HERNIA REPAIR  10/23/2012   Procedure: HERNIA REPAIR INGUINAL ADULT;  Surgeon: Odis Hollingshead, MD;  Location: [place];  Service: General;  Laterality: Left;  Left lower quadrant; left inguinal hernia repair with mesh   INSERTION OF MESH  10/23/2012   Procedure: INSERTION OF MESH;  Surgeon: Odis Hollingshead, MD;  Location:  [place];  Service: General;  Laterality: Left;  Left lower quadrant   LEFT HEART CATHETERIZATION WITH CORONARY ANGIOGRAM N/A 10/13/2011   Procedure: LEFT HEART CATHETERIZATION WITH CORONARY ANGIOGRAM;  Surgeon: Peter M Martinique, MD;  Location[facility] C[facility] LAB;  Service: Cardiovascular;  Laterality: N/A;   PERCUTANEOUS CORONARY STENT INTERVENTION (PCI-S) Right 10/13/2011   Procedure: PERCUTANEOUS CORONARY STENT INTERVENTION (PCI-S);  Surgeon: Peter M Martinique, MD;  Location[facility] CATH LAB;  Service: Cardiovascular;  Laterality: Right;   SHOULDER ARTHROSCOPY W/ ROTATOR CUFF REPAIR Left 1990's   TOTAL HIP ARTHROPLASTY Left 05/14/2021   Procedure: LEFT TOTAL HIP ARTHROPLASTY ANTERIOR APPROACH;  Surgeon: Mcarthur Rossetti, MD;  Location: [facility];  Service: Orthopedics;  Laterality: Left;  TRIGGER FINGER RELEASE Left    3rd and 4th digits   Family History  Problem Relation Age of Onset   Cancer Mother        lung   Heart disease Father        heart attack    Cancer Father        colon   Migraines Neg Hx    Headache Neg Hx    Social History   Socioeconomic History   Marital status: Married    Spouse name: Not on file   Number of children: Not on file   Years of education: Not on file   Highest education level: 12th grade  Occupational History    Comment: Retired -[employer]  Tobacco Use   Smoking status: Former    Packs/day: 2.00    Years: 20.00    Total pack years: 40.00    Types: Cigarettes    Quit date: 11/14/1982    Years since quitting: 39.8   Smokeless tobacco: Never  Vaping Use   Vaping Use: Never used  Substance and Sexual Activity   Alcohol use: Not Currently    Comment: occ   Drug use: No   Sexual activity: Not on file  Other Topics Concern   Not on file  Social History Narrative   Pt lives with his wife. 1 and a half story home  No issues with stairs. 12th grade education   Social Determinants of Health   Financial Resource Strain: Low Risk  (09/15/2022)   Overall Financial  Resource Strain (CARDIA)    Difficulty of Paying Living Expenses: Not hard at all  Food Insecurity: No Food Insecurity (09/15/2022)   Hunger Vital Sign    Worried About Running Out of Food in the Last Year: Never true    Ran Out of Food in the Last Year: Never true  Transportation Needs: No Transportation Needs (09/15/2022)   PRAPARE - Hydrologist (Medical): No    Lack of Transportation (Non-Medical): No  Physical Activity: Inactive (09/15/2022)   Exercise Vital Sign    Days of Exercise per Week: 0 days    Minutes of Exercise per Session: 0 min  Stress: No Stress Concern Present (09/15/2022)   [place]    Feeling of Stress : Not at all  Social Connections: Moderately Integrated (09/15/2022)   Social Connection and Isolation Panel [NHANES]    Frequency of Communication with Friends and Family: Three times a week    Frequency of Social Gatherings with Friends and Family: More than three times a week    Attends Religious Services: Never    Marine scientist or Organizations: Yes    Attends Music therapist: 1 to 4 times per year    Marital Status: Married    Tobacco Counseling Counseling given: Not Answered   Clinical Intake:  Pre-visit preparation completed: Yes  Pain : No/denies pain     BMI - recorded: 28.47 Nutritional Status: BMI 25 -29 Overweight Nutritional Risks: None Diabetes: Yes CBG done?: No Did pt. bring in CBG monitor from home?: No  How often do you need to have someone help you when you read instructions, pamphlets, or other written materials from your doctor or pharmacy?: 1 - Never  Diabetic?Nutrition Risk Assessment:  Has the patient had any N/V/D within the last 2 months?  No  Does the patient have any non-healing wounds?  No  Has the  patient had any unintentional weight loss or weight gain?  No   Diabetes:  Is the patient diabetic?  Yes   If diabetic, was a CBG obtained today?  No  Did the patient bring in their glucometer from home?  No  How often do you monitor your CBG's? Every other day .   Financial Strains and Diabetes Management:  Are you having any financial strains with the device, your supplies or your medication? No .  Does the patient want to be seen by Chronic Care Management for management of their diabetes?  No  Would the patient like to be referred to a Nutritionist or for Diabetic Management?  No   Diabetic Exams:  Diabetic Eye Exam: Overdue for diabetic eye exam. Pt has been advised about the importance in completing this exam. Patient advised to call and schedule an eye exam. Diabetic Foot Exam: Overdue, Pt has been advised about the importance in completing this exam. Pt is scheduled for diabetic foot exam on will complete at [facility] appt per pt.   Interpreter Needed?: No  Information entered by :: Dayton Martes   Activities of Daily Living    09/15/2022    9:30 AM 11/05/2021   12:00 AM  In your present state of health, do you have any difficulty performing the following activities:  Hearing? 0 0  Vision? 0 0  Difficulty concentrating or making decisions? 0 0  Walking or climbing stairs? 0 0  Dressing or bathing? 0 0  Doing errands, shopping? 0 1  Preparing Food and eating ? N   Using the Toilet? N   In the past six months, have you accidently leaked urine? N   Do you have problems with loss of bowel control? N   Managing your Medications? N   Managing your Finances? N   Housekeeping or managing your Housekeeping? N     Patient Care Team: Vivi Barrack, MD as PCP - General (Family Medicine) Martinique, Peter M, MD as PCP - Cardiology (Cardiology) [place] as Referring Physician ([place]) Jovita Gamma, MD as Consulting Physician (Neurosurgery) Martinique, Peter M, MD as Consulting Physician (Cardiology) Marica Otter, [place] (Optometry) Edythe Clarity, [facility]  (Pharmacist)  Indicate any recent Medical Services you may have received from other than [facility] providers in the past year (date may be approximate).     Assessment:   This is a routine wellness examination for [place].  Hearing/Vision screen Hearing Screening - Comments:: Pt wears hearing aids  [facility] - [facility]:: [facility]   Dietary issues and exercise activities discussed: Current Exercise Habits: The patient does not participate in regular exercise at present   Goals Addressed             This Visit's Progress    Patient Stated       Get A1C under control        Depression Screen    09/15/2022    9:27 AM 06/15/2022    8:18 AM 03/28/2022   11:19 AM 09/02/2021    8:14 AM 06/09/2021    9:11 AM 12/10/2020    9:30 AM 11/27/2019   11:49 AM  PHQ 2/9 Scores  PHQ - 2 Score 0 0 0 0 0 0 3  PHQ- 9 Score       6    Fall Risk    09/15/2022    9:29 AM 06/15/2022    8:18 AM 03/28/2022   11:19  AM 10/20/2021    3:34 PM 09/02/2021    8:16 AM  Fall Risk   Falls in the past year? 0 0 0 0 0  Number falls in past yr: 0 0 0  0  Injury with Fall? 0 0 0  0  Risk for fall due to : Impaired vision;Impaired balance/gait;Impaired mobility No Fall Risks No Fall Risks  Impaired vision  Follow up Falls prevention discussed    Falls prevention discussed    FALL RISK PREVENTION PERTAINING TO THE HOME:  Any stairs in or around the home? Yes  If so, are there any without handrails? No  Home free of loose throw rugs in walkways, pet beds, electrical cords, etc? Yes  Adequate lighting in your home to reduce risk of falls? Yes   ASSISTIVE DEVICES UTILIZED TO PREVENT FALLS:  Life alert? No  Use of a cane, walker or w/c? Yes  Grab bars in the bathroom? Yes  Shower chair or bench in shower? No  Elevated toilet seat or a handicapped toilet? No   TIMED UP AND GO:  Was the test performed? No .   Cognitive Function:    05/29/2019    9:22 AM 05/03/2017     8:09 AM  MMSE - Mini Mental State Exam  Orientation to time 5 5  Orientation to Place 5 5  Registration 3 3  Attention/ Calculation 5 5  Recall 3 3  Language- name 2 objects  2  Language- repeat 1 1  Language- follow 3 step command 3 3  Language- read & follow direction 1 1  Write a sentence  1  Copy design  1  Total score  30        09/15/2022    9:31 AM 09/02/2021    8:18 AM  6CIT Screen  What Year? 0 points 0 points  What month? 0 points 0 points  What time? 0 points 0 points  Count back from 20 0 points 0 points  Months in reverse 0 points 0 points  Repeat phrase 0 points 4 points  Total Score 0 points 4 points    Immunizations Immunization History  Administered Date(s) Administered   Fluad Quad(high Dose 65+) 10/06/2020, 09/02/2021   Influenza Whole 11/14/2005   Influenza, High Dose Seasonal PF 08/10/2012, 09/12/2013, 08/28/2015, 10/19/2016, 09/18/2017, 10/21/2017, 09/12/2018, 09/24/2019   Influenza-Unspecified 09/14/2010, 09/15/2011, 07/15/2013, 10/01/2014, 10/14/2016   Moderna Sars-Covid-2 Vaccination 11/29/2019, 12/27/2019, 09/01/2020   Pneumococcal Conjugate-13 05/06/2014   Pneumococcal Polysaccharide-23 03/03/2009   Pneumococcal-Unspecified 05/14/2010   Td 10/14/2006   Tdap 11/14/2009, 11/30/2017   Zoster Recombinat (Shingrix) 08/30/2017, 02/12/2018, 04/14/2018   Zoster, Live 10/28/2009    TDAP status: Up to date  Flu Vaccine status: Due, Education has been provided regarding the importance of this vaccine. Advised may receive this vaccine at local pharmacy or Health Dept. Aware to provide a copy of the vaccination record if obtained from local pharmacy or Health Dept. Verbalized acceptance and understanding.  Pneumococcal vaccine status: Up to date  Covid-19 vaccine status: Completed vaccines  Qualifies for Shingles Vaccine? Yes   Zostavax completed Yes   Shingrix Completed?: Yes  Screening Tests Health Maintenance  Topic Date Due    OPHTHALMOLOGY EXAM  11/14/2019   FOOT EXAM  05/28/2020   HEMOGLOBIN A1C  02/08/2023   Diabetic kidney evaluation - GFR measurement  06/16/2023   Diabetic kidney evaluation - Urine ACR  06/16/2023   Medicare Annual Wellness (AWV)  09/16/2023   TETANUS/TDAP  12/01/2027  Pneumonia Vaccine 16+ Years old  Completed   Hepatitis C Screening  Completed   Zoster Vaccines- Shingrix  Completed   HPV VACCINES  Aged Out   INFLUENZA VACCINE  Discontinued   COLONOSCOPY (Pts 45-87yr Insurance coverage will need to be confirmed)  Discontinued   COVID-19 Vaccine  Discontinued    Health Maintenance  Health Maintenance Due  Topic Date Due   OPHTHALMOLOGY EXAM  11/14/2019   FOOT EXAM  05/28/2020    Colorectal cancer screening: No longer required.    Additional Screening:  Hepatitis C Screening:  Completed 05/16/18  Vision Screening: Recommended annual ophthalmology exams for early detection of glaucoma and other disorders of the eye. Is the patient up to date with their annual eye exam?  No  Who is the provider or what is the [facility] the [facility]  If pt is not established with a provider, would they like to be referred to a provider to establish care? No .   Dental Screening: Recommended annual dental exams for proper oral hygiene  Community Resource Referral / Chronic Care Management: CRR required this visit?  No   CCM required this visit?  No      Plan:     I have personally reviewed and noted the following in the patient's chart:   Medical and social history Use of alcohol, tobacco or illicit drugs  Current medications and supplements including opioid prescriptions. Patient is currently taking opioid prescriptions. Information provided to patient regarding non-opioid alternatives. Patient advised to discuss non-opioid treatment plan with their provider. Functional ability and status Nutritional status Physical activity Advanced  directives List of other physicians Hospitalizations, surgeries, and ER visits in previous 12 months Vitals Screenings to include cognitive, depression, and falls Referrals and appointments  In addition, I have reviewed and discussed with patient certain preventive protocols, quality metrics, and best practice recommendations. A written personalized care plan for preventive services as well as general preventive health recommendations were provided to patient.     TWillette Brace LPN   [id number]  Nurse Notes: none

## 2022-09-20 ENCOUNTER — Encounter: Payer: Self-pay | Admitting: Family Medicine

## 2022-09-20 ENCOUNTER — Ambulatory Visit: Payer: Medicare HMO | Admitting: Family Medicine

## 2022-09-20 VITALS — BP 117/65 | HR 62 | Temp 97.3°F | Ht 73.0 in | Wt 208.6 lb

## 2022-09-20 DIAGNOSIS — I152 Hypertension secondary to endocrine disorders: Secondary | ICD-10-CM | POA: Diagnosis not present

## 2022-09-20 DIAGNOSIS — E1159 Type 2 diabetes mellitus with other circulatory complications: Secondary | ICD-10-CM

## 2022-09-20 DIAGNOSIS — E1165 Type 2 diabetes mellitus with hyperglycemia: Secondary | ICD-10-CM | POA: Diagnosis not present

## 2022-09-20 DIAGNOSIS — S83241A Other tear of medial meniscus, current injury, right knee, initial encounter: Secondary | ICD-10-CM | POA: Diagnosis not present

## 2022-09-20 LAB — POCT GLYCOSYLATED HEMOGLOBIN (HGB A1C): Hemoglobin A1C: 6.9 % — AB (ref 4.0–5.6)

## 2022-09-20 NOTE — Progress Notes (Signed)
   Robert [name] is a 76 y.o. male who presents today for an office visit.  Assessment/Plan:  Chronic Problems Addressed Today: Type 2 diabetes mellitus with hyperglycemia ([name]) A1c better controlled 6.9.  He has been on metformin 1000 mg twice daily and tolerating well.  No significant side effects.  He is concerned about long-term side effects this medication.  We reassured patient this is generally safe and should not have any significant issues.  He will come back in 6 months to recheck A1c and we can titrate the dose as needed.  Hypertension associated with diabetes ([place]) At goal metoprolol tartrate 12.5 mg daily.  Acute medial meniscus tear of right knee Follow with orthopedics.  His A1c is now at range for knee replacement surgery.  He will contact his surgeon's office and we can fax over a new clearance form if needed.    Subjective:  HPI:  See A/p for status of chronic conditions.         Objective:  Physical Exam: BP 117/65   Pulse 62   Temp (!) 97.3 F (36.3 C) (Temporal)   Ht '6\' 1"'$  (1.854 m)   Wt 208 lb 9.6 oz (94.6 kg)   SpO2 96%   BMI 27.52 kg/m   Gen: No acute distress, resting comfortably CV: Regular rate and rhythm with no murmurs appreciated Pulm: Normal work of breathing, clear to auscultation bilaterally with no crackles, wheezes, or rhonchi Neuro: Grossly normal, moves all extremities Psych: Normal affect and thought content      [name] M. Jerline Pain, MD 09/20/2022 8:48 AM

## 2022-09-20 NOTE — Assessment & Plan Note (Signed)
A1c better controlled 6.9.  He has been on metformin 1000 mg twice daily and tolerating well.  No significant side effects.  He is concerned about long-term side effects this medication.  We reassured patient this is generally safe and should not have any significant issues.  He will come back in 6 months to recheck A1c and we can titrate the dose as needed.

## 2022-09-20 NOTE — Assessment & Plan Note (Signed)
Follow with orthopedics.  His A1c is now at range for knee replacement surgery.  He will contact his surgeon's office and we can fax over a new clearance form if needed.

## 2022-09-20 NOTE — Assessment & Plan Note (Signed)
At goal metoprolol tartrate 12.5 mg daily.

## 2022-09-20 NOTE — Patient Instructions (Signed)
It was very nice to see you today!  Your A1c today is 6.9.  Please let us know if we need to send over a new surgical clearance form for your knee surgery.  We will see back in 6 months to recheck your A1c. Come back sooner if needed.   Take care, Dr Jerline Pain  PLEASE NOTE:  If you had any lab tests please let us know if you have not heard back within a few days. You may see your results on mychart before we have a chance to review them but we will give you a call once they are reviewed by [place]. If we ordered any referrals today, please let us know if you have not heard from their office within the next week.   Please try these tips to maintain a healthy lifestyle:  Eat at least 3 REAL meals and 1-2 snacks per day.  Aim for no more than 5 hours between eating.  If you eat breakfast, please do so within one hour of getting up.   Each meal should contain half fruits/vegetables, one quarter protein, and one quarter carbs (no bigger than a computer mouse)  Cut down on sweet beverages. This includes juice, soda, and sweet tea.   Drink at least 1 glass of water with each meal and aim for at least 8 glasses per day  Exercise at least 150 minutes every week.

## 2022-09-28 DIAGNOSIS — Z23 Encounter for immunization: Secondary | ICD-10-CM | POA: Diagnosis not present

## 2022-10-21 ENCOUNTER — Telehealth: Payer: Self-pay | Admitting: Neurology

## 2022-10-21 NOTE — Telephone Encounter (Signed)
Pt is calling. Stated he needs an appoinment to see Dr. Jaynee Eagles. Stated he is worsening headaches and doing a lot of sleeping. Pt is requesting a call-back.

## 2022-10-22 ENCOUNTER — Other Ambulatory Visit: Payer: Self-pay | Admitting: Family Medicine

## 2022-10-24 NOTE — Telephone Encounter (Signed)
I called pt and he stated he was unsure if he had appt for follow up.  He stated that severity of migraines better, but no frequency. He is on botox and emgality.  Made appt for NP on 11-17-2022 at 0915 with SS/  pt verbalized understanding.

## 2022-11-10 DIAGNOSIS — M25561 Pain in right knee: Secondary | ICD-10-CM | POA: Diagnosis not present

## 2022-11-16 NOTE — Progress Notes (Deleted)
Patient: Robert [name] Date of Birth: 1946/10/14  Reason for Visit: Follow up History from: Patient Primary Neurologist: Jaynee Eagles    ASSESSMENT AND PLAN 77 y.o. year old male    HISTORY OF PRESENT ILLNESS: Today 11/16/22   HISTORY  Dr. Jaynee Eagles 08/08/2022: he had botox a few weeks ago. He has been feeling pretty good. Emgality is helping again. Gave him some samples because it is 180 a month - but getting new insurance next month, contact us will new insurance and we can refill emgality and see if it helps. Discussed emgality patient assistance, he declines.   REVIEW OF SYSTEMS: Out of a complete 14 system review of symptoms, the patient complains only of the following symptoms, and all other reviewed systems are negative.  See HPI  ALLERGIES: Allergies  Allergen Reactions   Sumatriptan Shortness Of Breath    REACTION: sob, headache scalp sensitivity   Penicillins Itching   Brinzolamide-Brimonidine     Other reaction(s): Itching of eye   Lipitor [Atorvastatin] Nausea And Vomiting   Oxycodone Itching    HOME MEDICATIONS: Outpatient Medications Prior to Visit  Medication Sig Dispense Refill   acetaminophen (TYLENOL) 500 MG tablet Take 1,000 mg by mouth every 6 (six) hours as needed for moderate pain.     Ascorbic Acid (VITAMIN C) 1000 MG tablet Take 1,000 mg by mouth daily.     ASPIRIN 81 PO Take 81 mg by mouth daily.     azelastine (ASTELIN) 0.1 % nasal spray Place 1 spray into both nostrils daily as needed for rhinitis.     B Complex-C (SUPER B COMPLEX PO) Take 1 tablet by mouth daily.     brimonidine (ALPHAGAN) 0.2 % ophthalmic solution Place 1 drop into the right eye in the morning and at bedtime.     dicyclomine (BENTYL) 10 MG capsule TAKE 1 CAPSULE (10 MG TOTAL) BY MOUTH 4 (FOUR) TIMES DAILY   BEFORE MEALS AND AT BEDTIME.     EMGALITY 120 MG/ML SOAJ INJECT 120 MG INTO THE SKIN EVERY 30 (THIRTY) DAYS. 1 mL 11   ezetimibe (ZETIA) 10 MG tablet Take 0.5 tablets by mouth  daily.     fluticasone (FLONASE) 50 MCG/ACT nasal spray Place 1 spray into both nostrils daily as needed for allergies.     latanoprost (XALATAN) 0.005 % ophthalmic solution Place 1 drop into the right eye at bedtime.     metFORMIN (GLUCOPHAGE) 500 MG tablet TAKE 1 TABLET BY MOUTH EVERY DAY WITH BREAKFAST 30 tablet 3   metoprolol tartrate (LOPRESSOR) 25 MG tablet Take 12.5 mg by mouth daily.     Multiple Vitamins-Minerals (MULTIVITAMINS THER. W/MINERALS) TABS Take 1 tablet by mouth daily.     nitroGLYCERIN (NITROSTAT) 0.4 MG SL tablet Place 1 tablet (0.4 mg total) under the tongue every 5 (five) minutes as needed for chest pain. Do not exceed more than 3 doses per day. 25 tablet 11   NURTEC 75 MG TBDP TAKE 75 MG BY MOUTH DAILY AS NEEDED. FOR MIGRAINES. TAKE AS CLOSE TO ONSET OF MIGRAINE AS POSSIBLE. ONE DAILY MAXIMUM. (Patient taking differently: Take 75 mg by mouth daily as needed (migraine).) 10 tablet 6   pantoprazole (PROTONIX) 40 MG tablet Take 40 mg by mouth daily.     prednisoLONE acetate (PRED FORTE) 1 % ophthalmic suspension      rosuvastatin (CRESTOR) 40 MG tablet Take 40 mg by mouth daily.     tamsulosin (FLOMAX) 0.4 MG CAPS capsule TAKE 1 CAPSULE  BY MOUTH EVERY DAY 30 capsule 1   traMADol (ULTRAM) 50 MG tablet TAKE TWO TABLETS BY MOUTH FOUR TIMES A DAY AS NEEDED FOR RELIEF OF MIGRAINE     vitamin E 180 MG (400 UNITS) capsule Take 400 Units by mouth daily.     zinc gluconate 50 MG tablet Take 50 mg by mouth daily.     No facility-administered medications prior to visit.    PAST MEDICAL HISTORY: Past Medical History:  Diagnosis Date   Arthritis    CAD (coronary artery disease) 10/10/2011   NSTEMI with DES to the proximal RCA following flow wire evaluation. EF is normal.    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   CORONARY ANGIOPLASTY WITH STENT PLACEMENT  10/13/2011   DES to the proximal RCA. Dr Martinique   FRACTIONAL FLOW RESERVE WIRE Right 10/13/2011   Procedure: [place];  Surgeon: Peter M Martinique, MD;  Location: [facility] CATH LAB;  Service: Cardiovascular;  Laterality: Right;   HERNIA REPAIR     INGUINAL HERNIA REPAIR  10/23/2012   Procedure: HERNIA REPAIR INGUINAL ADULT;  Surgeon: Odis Hollingshead, MD;  Location: [place];  Service: General;  Laterality: Left;  Left lower quadrant; left inguinal hernia repair with mesh   INSERTION OF MESH  10/23/2012   Procedure: INSERTION OF MESH;  Surgeon: Odis Hollingshead, MD;  Location: [place];  Service: General;  Laterality: Left;  Left lower quadrant   LEFT HEART CATHETERIZATION WITH CORONARY ANGIOGRAM N/A 10/13/2011   Procedure: LEFT HEART CATHETERIZATION WITH CORONARY ANGIOGRAM;  Surgeon: Peter M Martinique, MD;  [facility] C[facility] LAB;  Service: Cardiovascular;  Laterality: N/A;   PERCUTANEOUS CORONARY STENT INTERVENTION (PCI-S) Right 10/13/2011   Procedure: PERCUTANEOUS CORONARY STENT INTERVENTION (PCI-S);  Surgeon: Peter M Martinique, MD;  Location[facility] C[facility] LAB;  Service: Cardiovascular;  Laterality: Right;   SHOULDER ARTHROSCOPY W/ ROTATOR CUFF REPAIR Left 1990's   TOTAL HIP ARTHROPLASTY Left 05/14/2021   Procedure: LEFT TOTAL HIP ARTHROPLASTY ANTERIOR APPROACH;  Surgeon: Mcarthur Rossetti, MD;  Location: [facility];  Service: Orthopedics;  Laterality: Left;   TRIGGER FINGER RELEASE Left    3rd and 4th digits    FAMILY  HISTORY: Family History  Problem Relation Age of Onset   Cancer Mother        lung   Heart disease Father        heart attack    Cancer Father        colon   Migraines Neg Hx    Headache Neg Hx     SOCIAL HISTORY: Social History   Socioeconomic History   Marital status: Married    Spouse name: Not on file   Number of children: Not on file   Years of education: Not on file  Highest education level: 12th grade  Occupational History    Comment: Retired -[employer]  Tobacco Use   Smoking status: Former    Packs/day: 2.00    Years: 20.00    Total pack years: 40.00    Types: Cigarettes    Quit date: 11/14/1982    Years since quitting: 40.0   Smokeless tobacco: Never  Vaping Use   Vaping Use: Never used  Substance and Sexual Activity   Alcohol use: Not Currently    Comment: occ   Drug use: No   Sexual activity: Not on file  Other Topics Concern   Not on file  Social History Narrative   Pt lives with his wife. 1 and a half story home  No issues with stairs. 12th grade education   Social Determinants of Health   Financial Resource Strain: Low Risk  (09/15/2022)   Overall Financial Resource Strain (CARDIA)    Difficulty of Paying Living Expenses: Not hard at all  Food Insecurity: No Food Insecurity (09/15/2022)   Hunger Vital Sign    Worried About Running Out of Food in the Last Year: Never true    Ran Out of Food in the Last Year: Never true  Transportation Needs: No Transportation Needs (09/15/2022)   PRAPARE - Hydrologist (Medical): No    Lack of Transportation (Non-Medical): No  Physical Activity: Inactive (09/15/2022)   Exercise Vital Sign    Days of Exercise per Week: 0 days    Minutes of Exercise per Session: 0 min  Stress: No Stress Concern Present (09/15/2022)   [place]    Feeling of Stress : Not at all  Social Connections: Moderately Integrated (09/15/2022)   Social  Connection and Isolation Panel [NHANES]    Frequency of Communication with Friends and Family: Three times a week    Frequency of Social Gatherings with Friends and Family: More than three times a week    Attends Religious Services: Never    Marine scientist or Organizations: Yes    Attends Archivist Meetings: 1 to 4 times per year    Marital Status: Married  Human resources officer Violence: Not At Risk (09/15/2022)   Humiliation, Afraid, Rape, and Kick questionnaire    Fear of Current or Ex-Partner: No    Emotionally Abused: No    Physically Abused: No    Sexually Abused: No    PHYSICAL EXAM  There were no vitals filed for this visit. There is no height or weight on file to calculate BMI.  Generalized: Well developed, in no acute distress  Neurological examination  Mentation: Alert oriented to time, place, history taking. Follows all commands speech and language fluent Cranial nerve II-XII: Pupils were equal round reactive to light. Extraocular movements were full, visual field were full on confrontational test. Facial sensation and strength were normal. Uvula tongue midline. Head turning and shoulder shrug  were normal and symmetric. Motor: The motor testing reveals 5 over 5 strength of all 4 extremities. Good symmetric motor tone is noted throughout.  Sensory: Sensory testing is intact to soft touch on all 4 extremities. No evidence of extinction is noted.  Coordination: Cerebellar testing reveals good finger-nose-finger and heel-to-shin bilaterally.  Gait and station: Gait is normal. Tandem gait is normal. Romberg is negative. No drift is seen.  Reflexes: Deep tendon reflexes are symmetric and normal bilaterally.   DIAGNOSTIC DATA (LABS, IMAGING, TESTING) - I reviewed patient records,  labs, notes, testing and imaging myself where available.  Lab Results  Component Value Date   WBC 5.9 06/15/2022   HGB 14.0 06/15/2022   HCT 42.7 06/15/2022   MCV 95.0 06/15/2022    PLT 178.0 06/15/2022      Component Value Date/Time   NA 140 06/15/2022 0916   NA 139 06/09/2020 1054   K 4.9 06/15/2022 0916   CL 101 06/15/2022 0916   CO2 29 06/15/2022 0916   GLUCOSE 195 (H) 06/15/2022 0916   GLUCOSE 101 (H) 10/18/2006 1119   BUN 15 06/15/2022 0916   BUN 12 06/09/2020 1054   CREATININE 0.99 06/15/2022 0916   CREATININE 1.02 01/26/2018 1555   CALCIUM 9.7 06/15/2022 0916   PROT 6.9 06/15/2022 0916   PROT 6.6 06/09/2020 1054   ALBUMIN 4.4 06/15/2022 0916   ALBUMIN 4.6 06/09/2020 1054   AST 21 06/15/2022 0916   ALT 25 06/15/2022 0916   ALKPHOS 61 06/15/2022 0916   BILITOT 0.5 06/15/2022 0916   BILITOT 0.4 06/09/2020 1054   GFRNONAA >60 11/06/2021 0504   GFRAA 92 06/09/2020 1054   Lab Results  Component Value Date   CHOL 157 06/15/2022   HDL 58.60 06/15/2022   LDLCALC 75 06/15/2022   LDLDIRECT 137.0 03/31/2010   TRIG 120.0 06/15/2022   CHOLHDL 3 06/15/2022   Lab Results  Component Value Date   HGBA1C 6.9 (A) 09/20/2022   No results found for: "VITAMINB12" Lab Results  Component Value Date   TSH 2.28 06/15/2022    Butler Denmark, AGNP-C, DNP 11/16/2022, 11:22 AM [facility] [address] [place], [place] [address] [phone]

## 2022-11-17 ENCOUNTER — Ambulatory Visit: Payer: Medicare HMO | Admitting: Neurology

## 2022-11-30 ENCOUNTER — Ambulatory Visit: Payer: Medicare Other | Admitting: Neurology

## 2022-11-30 VITALS — BP 146/66 | HR 61 | Ht 73.0 in | Wt 208.0 lb

## 2022-11-30 DIAGNOSIS — G43711 Chronic migraine without aura, intractable, with status migrainosus: Secondary | ICD-10-CM | POA: Diagnosis not present

## 2022-11-30 MED ORDER — AJOVY 225 MG/1.5ML ~~LOC~~ SOAJ: 225.0000 mg | SUBCUTANEOUS | 11 refills | Status: DC

## 2022-11-30 MED ORDER — NURTEC 75 MG PO TBDP: 75.0000 mg | ORAL_TABLET | Freq: Every day | ORAL | 6 refills | Status: DC | PRN

## 2022-11-30 NOTE — Patient Instructions (Signed)
Stop the Emgality, switch to Ajovy  Use the Nurtec for acute headache Check MRI of the brain  Check labs  Referral to neuro rehab See you back in 4 months

## 2022-11-30 NOTE — Progress Notes (Signed)
Patient: Robert [name] Date of Birth: 12/12/1945  Reason for Visit: Follow up History from: Patient, wife Primary Neurologist: Jaynee Eagles    ASSESSMENT AND PLAN 77 y.o. year old male   1.  Intractable chronic migraine headache  -Stop Emgality, switch to Ajovy for migraine preventative, is also on Botox through the [place] -Use Nurtec as needed for acute migraine headache -Check MRI of the brain with and without contrast to rule out acute abnormality in the setting of worsening headache with new features despite CGRP and Botox therapy -Check ESR, CRP to rule out temporal arteritis due to worsening headache given his age -Referral to neurorehab for worsening vertigo for vestibular rehab -I would consider Vyepti if Ajovy is not covered by the [place] or not efficacious, however would ensure no recent stroke, he does have remote history of stroke and MI with stent -Follow-up in 4 months or sooner if needed -Of note, patient is already on CPAP and reports nightly compliance   Meds ordered this encounter  Medications   Fremanezumab-vfrm (AJOVY) 225 MG/1.5ML SOAJ    Sig: Inject 225 mg into the skin every 30 (thirty) days.    Dispense:  1.68 mL    Refill:  11   Rimegepant Sulfate (NURTEC) 75 MG TBDP    Sig: Take 75 mg by mouth daily as needed. For migraines. Take as close to onset of migraine as possible. One daily maximum.    Dispense:  10 tablet    Refill:  6    PA is pending from [facility]. We will update as soon as we hear back.   Orders Placed This Encounter  Procedures   MR BRAIN W WO CONTRAST   Sedimentation Rate   C-reactive Protein   Creatinine, Serum   Referral to Neuro Rehab   HISTORY OF PRESENT ILLNESS: Today 11/30/22 Here today with his wife. Remains on Emgality for about a year, it was working well until 3 months ago. Remains on Botox for migraine from the [place]. He stopped the corrugator and procerus muscles due to drooping. Bad migraines since 1960's. Has 5-6 migraines a week with  pressure, nausea. Miserable quality of life. Headaches getting worse in the last 3 months. He rarely has a good day. Headaches are prolonged, throbbing which is new. He has concerns, mentions getting an MRI. Vertigo has returned. Takes meclizine with good benefit. Takes tramadol and tylenol for migraine. Nurtec helps, but he doesn't take it, worries about taking too often. Has history of MI 8 years ago with stent. 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   HISTORY  Dr. Jaynee Eagles 08/08/2022: he had botox a few weeks ago. He has been feeling pretty good. Emgality is helping again. Gave him some samples because it is 180 a month - but getting new insurance next month, contact us will new insurance and we can refill emgality and see if it helps. Discussed emgality patient assistance, he declines.   REVIEW OF SYSTEMS: Out of a complete 14 system review of symptoms, the patient complains only of the following symptoms, and all other reviewed systems are negative.  See HPI  ALLERGIES: Allergies  Allergen Reactions   Sumatriptan Shortness Of Breath    REACTION: sob, headache scalp sensitivity   Penicillins Itching   Brinzolamide-Brimonidine     Other reaction(s): Itching of eye   Lipitor [Atorvastatin] Nausea And Vomiting   Oxycodone Itching    HOME MEDICATIONS: Outpatient Medications Prior to Visit  Medication Sig Dispense Refill  acetaminophen (TYLENOL) 500 MG tablet Take 1,000 mg by mouth every 6 (six) hours as needed for moderate pain.     Ascorbic Acid (VITAMIN C) 1000 MG tablet Take 1,000 mg by mouth daily.     ASPIRIN 81 PO Take 81 mg by mouth daily.     azelastine (ASTELIN) 0.1 % nasal spray Place 1 spray into both nostrils daily as needed for rhinitis.     B Complex-C (SUPER B COMPLEX PO) Take 1 tablet by mouth daily.     brimonidine (ALPHAGAN) 0.2 % ophthalmic solution Place 1 drop into the right eye in the morning and at bedtime.     dicyclomine (BENTYL) 10 MG capsule  TAKE 1 CAPSULE (10 MG TOTAL) BY MOUTH 4 (FOUR) TIMES DAILY   BEFORE MEALS AND AT BEDTIME.     ezetimibe (ZETIA) 10 MG tablet Take 0.5 tablets by mouth daily.     fluticasone (FLONASE) 50 MCG/ACT nasal spray Place 1 spray into both nostrils daily as needed for allergies.     latanoprost (XALATAN) 0.005 % ophthalmic solution Place 1 drop into the right eye at bedtime.     metFORMIN (GLUCOPHAGE) 500 MG tablet TAKE 1 TABLET BY MOUTH EVERY DAY WITH BREAKFAST 30 tablet 3   metoprolol tartrate (LOPRESSOR) 25 MG tablet Take 12.5 mg by mouth daily.     Multiple Vitamins-Minerals (MULTIVITAMINS THER. W/MINERALS) TABS Take 1 tablet by mouth daily.     pantoprazole (PROTONIX) 40 MG tablet Take 40 mg by mouth daily.     prednisoLONE acetate (PRED FORTE) 1 % ophthalmic suspension      rosuvastatin (CRESTOR) 40 MG tablet Take 40 mg by mouth daily.     tamsulosin (FLOMAX) 0.4 MG CAPS capsule TAKE 1 CAPSULE BY MOUTH EVERY DAY 30 capsule 1   traMADol (ULTRAM) 50 MG tablet TAKE TWO TABLETS BY MOUTH FOUR TIMES A DAY AS NEEDED FOR RELIEF OF MIGRAINE     vitamin E 180 MG (400 UNITS) capsule Take 400 Units by mouth daily.     zinc gluconate 50 MG tablet Take 50 mg by mouth daily.     EMGALITY 120 MG/ML SOAJ INJECT 120 MG INTO THE SKIN EVERY 30 (THIRTY) DAYS. 1 mL 11   NURTEC 75 MG TBDP TAKE 75 MG BY MOUTH DAILY AS NEEDED. FOR MIGRAINES. TAKE AS CLOSE TO ONSET OF MIGRAINE AS POSSIBLE. ONE DAILY MAXIMUM. (Patient taking differently: Take 75 mg by mouth daily as needed (migraine).) 10 tablet 6   nitroGLYCERIN (NITROSTAT) 0.4 MG SL tablet Place 1 tablet (0.4 mg total) under the tongue every 5 (five) minutes as needed for chest pain. Do not exceed more than 3 doses per day. 25 tablet 11   No facility-administered medications prior to visit.    PAST MEDICAL HISTORY: Past Medical History:  Diagnosis Date   Arthritis    CAD (coronary artery disease) 10/10/2011   NSTEMI with DES to the proximal RCA following flow wire  evaluation. EF is normal.    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   CORONARY ANGIOPLASTY WITH STENT PLACEMENT  10/13/2011   DES to the proximal RCA. Dr Martinique   FRACTIONAL FLOW RESERVE WIRE Right 10/13/2011   Procedure: [place];  Surgeon: Peter M Martinique, MD;  Location: [facility] CATH LAB;  Service: Cardiovascular;  Laterality: Right;   HERNIA REPAIR     INGUINAL HERNIA REPAIR  10/23/2012   Procedure: HERNIA REPAIR INGUINAL ADULT;  Surgeon: Odis Hollingshead, MD;  Location: [place];  Service: General;  Laterality: Left;  Left lower quadrant; left inguinal hernia repair with mesh   INSERTION OF MESH  10/23/2012   Procedure: INSERTION OF MESH;  Surgeon: Odis Hollingshead, MD;  Location: [place];  Service: General;  Laterality: Left;  Left lower quadrant   LEFT HEART CATHETERIZATION WITH CORONARY ANGIOGRAM N/A 10/13/2011   Procedure: LEFT HEART CATHETERIZATION WITH CORONARY ANGIOGRAM;  Surgeon: Peter M Martinique, MD;  Location: [facility] CATH LAB;  Service: Cardiovascular;  Laterality: N/A;   PERCUTANEOUS CORONARY STENT INTERVENTION (PCI-S) Right 10/13/2011   Procedure: PERCUTANEOUS CORONARY STENT INTERVENTION (PCI-S);  Surgeon: Peter M Martinique, MD;  Location: [facility] CATH LAB;  Service: Cardiovascular;  Laterality: Right;   SHOULDER ARTHROSCOPY W/ ROTATOR CUFF REPAIR Left 1990's   TOTAL HIP ARTHROPLASTY Left 05/14/2021    Procedure: LEFT TOTAL HIP ARTHROPLASTY ANTERIOR APPROACH;  Surgeon: Mcarthur Rossetti, MD;  Location: [facility];  Service: Orthopedics;  Laterality: Left;   TRIGGER FINGER RELEASE Left    3rd and 4th digits    FAMILY HISTORY: Family History  Problem Relation Age of Onset   Cancer Mother        lung   Heart disease Father        heart attack    Cancer Father        colon   Migraines Neg Hx    Headache Neg Hx     SOCIAL HISTORY: Social History   Socioeconomic History   Marital status: Married    Spouse name: Not on file   Number of children: Not on file   Years of education: Not on file   Highest education level: 12th grade  Occupational History    Comment: Retired -[employer]  Tobacco Use   Smoking status: Former    Packs/day: 2.00    Years: 20.00    Total pack years: 40.00    Types: Cigarettes    Quit date: 11/14/1982    Years since quitting: 40.0   Smokeless tobacco: Never  Vaping Use   Vaping Use: Never used  Substance and Sexual Activity   Alcohol use: Not Currently    Comment: occ   Drug use: No   Sexual activity: Not on file  Other Topics Concern   Not on file  Social History Narrative   Pt lives with his wife. 1 and a half story home  No issues with stairs. 12th grade education   Social Determinants of Health   Financial Resource Strain: Low Risk  (09/15/2022)   Overall Financial Resource Strain (CARDIA)    Difficulty of Paying Living Expenses: Not hard at all  Food Insecurity: No Food Insecurity (09/15/2022)   Hunger Vital Sign    Worried About Running Out of Food in the Last Year: Never true    Ran Out of  Food in the Last Year: Never true  Transportation Needs: No Transportation Needs (09/15/2022)   PRAPARE - Hydrologist (Medical): No    Lack of Transportation (Non-Medical): No  Physical Activity: Inactive (09/15/2022)   Exercise Vital Sign    Days of Exercise per Week: 0 days    Minutes of Exercise per Session: 0 min   Stress: No Stress Concern Present (09/15/2022)   [place]    Feeling of Stress : Not at all  Social Connections: Moderately Integrated (09/15/2022)   Social Connection and Isolation Panel [NHANES]    Frequency of Communication with Friends and Family: Three times a week    Frequency of Social Gatherings with Friends and Family: More than three times a week    Attends Religious Services: Never    Marine scientist or Organizations: Yes    Attends Archivist Meetings: 1 to 4 times per year    Marital Status: Married  Human resources officer Violence: Not At Risk (09/15/2022)   Humiliation, Afraid, Rape, and Kick questionnaire    Fear of Current or Ex-Partner: No    Emotionally Abused: No    Physically Abused: No    Sexually Abused: No   PHYSICAL EXAM  Vitals:   11/30/22 1543  BP: (!) 146/66  Pulse: 61  Weight: 208 lb (94.3 kg)  Height: '6\' 1"'$  (1.854 m)   Body mass index is 27.44 kg/m.  Generalized: Well developed, in no acute distress  Neurological examination  Mentation: Alert oriented to time, place, history taking. Follows all commands speech and language fluent Cranial nerve II-XII: Pupils were equal round reactive to light. Extraocular movements were full, visual field were full on confrontational test. Facial sensation and strength were normal. Head turning and shoulder shrug  were normal and symmetric. Motor: The motor testing reveals 5 over 5 strength of all 4 extremities. Good symmetric motor tone is noted throughout.  Sensory: Sensory testing is intact to soft touch on all 4 extremities. No evidence of extinction is noted.  Coordination: Cerebellar testing reveals good finger-nose-finger and heel-to-shin bilaterally.  Gait and station: Gait is normal.  Reflexes: Deep tendon reflexes are symmetric and normal bilaterally.   DIAGNOSTIC DATA (LABS, IMAGING, TESTING) - I reviewed patient records,  labs, notes, testing and imaging myself where available.  Lab Results  Component Value Date   WBC 5.9 06/15/2022   HGB 14.0 06/15/2022   HCT 42.7 06/15/2022   MCV 95.0 06/15/2022   PLT 178.0 06/15/2022      Component Value Date/Time   NA 140 06/15/2022 0916   NA 139 06/09/2020 1054   K 4.9 06/15/2022 0916   CL 101 06/15/2022 0916   CO2 29 06/15/2022 0916   GLUCOSE 195 (H) 06/15/2022 0916   GLUCOSE 101 (H) 10/18/2006 1119   BUN 15 06/15/2022 0916   BUN 12 06/09/2020 1054   CREATININE 0.99 06/15/2022 0916   CREATININE 1.02 01/26/2018 1555   CALCIUM 9.7 06/15/2022 0916   PROT 6.9 06/15/2022 0916   PROT 6.6 06/09/2020 1054   ALBUMIN 4.4 06/15/2022 0916   ALBUMIN 4.6 06/09/2020 1054   AST 21 06/15/2022 0916   ALT 25 06/15/2022 0916   ALKPHOS 61 06/15/2022 0916   BILITOT 0.5 06/15/2022 0916   BILITOT 0.4 06/09/2020 1054   GFRNONAA >60 11/06/2021 0504   GFRAA 92 06/09/2020 1054   Lab Results  Component Value Date   CHOL 157 06/15/2022  HDL 58.60 06/15/2022   LDLCALC 75 06/15/2022   LDLDIRECT 137.0 03/31/2010   TRIG 120.0 06/15/2022   CHOLHDL 3 06/15/2022   Lab Results  Component Value Date   HGBA1C 6.9 (A) 09/20/2022   No results found for: "VITAMINB12" Lab Results  Component Value Date   TSH 2.28 06/15/2022    Butler Denmark, AGNP-C, DNP 11/30/2022, 4:33 PM [facility] [address] [place],  [address] [phone]

## 2022-12-01 ENCOUNTER — Telehealth: Payer: Self-pay | Admitting: Neurology

## 2022-12-01 LAB — SEDIMENTATION RATE: Sed Rate: 2 mm/hr (ref 0–30)

## 2022-12-01 LAB — C-REACTIVE PROTEIN: CRP: 1 mg/L (ref 0–10)

## 2022-12-01 LAB — CREATININE, SERUM
Creatinine, Ser: 0.86 mg/dL (ref 0.76–1.27)
eGFR: 89 mL/min/{1.73_m2} (ref >59)

## 2022-12-01 NOTE — Telephone Encounter (Signed)
[employer] medicare NPR sent to GI [phone]

## 2022-12-02 ENCOUNTER — Encounter: Payer: Self-pay | Admitting: Neurology

## 2022-12-07 ENCOUNTER — Other Ambulatory Visit: Payer: Self-pay

## 2022-12-07 ENCOUNTER — Ambulatory Visit: Payer: Medicare Other | Attending: Neurology | Admitting: Physical Therapy

## 2022-12-07 DIAGNOSIS — R42 Dizziness and giddiness: Secondary | ICD-10-CM | POA: Insufficient documentation

## 2022-12-07 DIAGNOSIS — R2681 Unsteadiness on feet: Secondary | ICD-10-CM | POA: Diagnosis present

## 2022-12-07 NOTE — Therapy (Signed)
OUTPATIENT PHYSICAL THERAPY VESTIBULAR EVALUATION     Patient Name: Robert [name] MRN: [id number] DOB:[birth year], 77 y.o., male Today's Date: 12/07/2022  END OF SESSION:  PT End of Session - 12/07/22 0809     Visit Number 1    Number of Visits 12    Date for PT Re-Evaluation 01/13/23    Authorization Type UHC Medicare    PT Start Time 0804    PT Stop Time 0851    PT Time Calculation (min) 47 min    Activity Tolerance Patient tolerated treatment well    Behavior During Therapy [facility] for tasks assessed/performed             Past Medical History:  Diagnosis Date   Arthritis    CAD (coronary artery disease) 10/10/2011   NSTEMI with DES to the proximal RCA following flow wire evaluation. EF is normal.    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   CORONARY ANGIOPLASTY WITH STENT PLACEMENT  10/13/2011   DES to the proximal RCA. Dr Martinique   FRACTIONAL FLOW RESERVE WIRE Right 10/13/2011   Procedure: [place];  Surgeon: Peter M Martinique, MD;  Location: [facility] CATH LAB;  Service: Cardiovascular;  Laterality: Right;   HERNIA REPAIR     INGUINAL HERNIA REPAIR  10/23/2012   Procedure: HERNIA REPAIR INGUINAL ADULT;  Surgeon: Odis Hollingshead, MD;  Location: [place];  Service: General;  Laterality: Left;  Left lower quadrant; left inguinal hernia repair with mesh   INSERTION OF MESH  10/23/2012   Procedure: INSERTION OF  MESH;  Surgeon: Odis Hollingshead, MD;  Location: [place];  Service: General;  Laterality: Left;  Left lower quadrant   LEFT HEART CATHETERIZATION WITH CORONARY ANGIOGRAM N/A 10/13/2011   Procedure: LEFT HEART CATHETERIZATION WITH CORONARY ANGIOGRAM;  Surgeon: Peter M Martinique, MD;  Location[facility] C[facility] LAB;  Service: Cardiovascular;  Laterality: N/A;   PERCUTANEOUS CORONARY STENT INTERVENTION (PCI-S) Right 10/13/2011   Procedure: PERCUTANEOUS CORONARY STENT INTERVENTION (PCI-S);  Surgeon: Peter M Martinique, MD;  Location: [facility] CATH LAB;  Service: Cardiovascular;  Laterality: Right;   SHOULDER ARTHROSCOPY W/ ROTATOR CUFF REPAIR Left 1990's   TOTAL HIP ARTHROPLASTY Left 05/14/2021   Procedure: LEFT TOTAL HIP ARTHROPLASTY ANTERIOR APPROACH;  Surgeon: Mcarthur Rossetti, MD;  Location: [facility];  Service: Orthopedics;  Laterality: Left;   TRIGGER FINGER RELEASE Left    3rd and 4th digits   Patient Active Problem List   Diagnosis Date Noted   Acute medial meniscus tear of right knee 05/30/2022   Chronic migraine without aura, with intractable migraine, so stated, with status migrainosus 09/20/2021   Status post total replacement of left hip 05/14/2021   Type 2 diabetes mellitus with hyperglycemia ([place]) 05/23/2018   Chronically dry eyes, bilateral 05/23/2018   Migraines 11/28/2016  Hypertension associated with diabetes ([place]) 10/11/2011   History of basal cell carcinoma (BCC), right ear 03/03/2009   Hyperlipidemia associated with type 2 diabetes mellitus ([place]) 01/11/2008   Allergic rhinitis 01/07/2008   Prostatitis, recurrent 01/07/2008   History of gastric ulcer 01/07/2008   History of colon polyps, followed by Dr. Earlean Shawl 01/07/2008   GERD 04/02/2007   Sleep apnea 04/02/2007    PCP: Dimas Chyle, MD REFERRING PROVIDER: Suzzanne Cloud, NP   REFERRING DIAG: [phone] (ICD-10-CM) - Chronic migraine without aura, with intractable migraine, so stated, with status migrainosus  THERAPY DIAG:  Dizziness  and giddiness  Unsteadiness on feet  ONSET DATE: 11/30/2021 (MD referral)  Rationale for Evaluation and Treatment: Rehabilitation  SUBJECTIVE:   SUBJECTIVE STATEMENT: Was having problems with vertigo.  Came on about 2 weeks ago, and was walking; suddenly I felt off balance.   Have to be careful with quick motions bending down, looking up, when getting in bed.  Is better today than it was. Pt accompanied by: self  PERTINENT HISTORY: Hx of migraines, on medications; received B[facility] shot at [facility] for migraines 12/06/2022  PAIN:  Are you having pain? Yes: NPRS scale: 4/10 Pain location: headache Pain description: migraine Aggravating factors: unsure Relieving factors: new medication, Nurtec helps  PRECAUTIONS: Fall and Other: hx of migraines, received Botox 12/06/2022  FALLS: Has patient fallen in last 6 months? No  LIVING ENVIRONMENT: Lives with: lives with their spouse Lives in: House/apartment   PLOF: Independent.  Enjoyed playing golf in the past; is scheduled for knee replacement in April  PATIENT GOALS: Would like to improve my symptoms  OBJECTIVE:   DIAGNOSTIC FINDINGS: scheduled for head MRI in February  COGNITION: Overall cognitive status: Within functional limits for tasks assessed   POSTURE:  No Significant postural limitations  Cervical ROM:  slight limitations with cervical extension, rotation-pain due to arthritis   TRANSFERS: Assistive device utilized: None  Sit to stand: Modified independence Stand to sit: Modified independence  PATIENT SURVEYS:  FOTO Intake score:  39, predicted 59  VESTIBULAR ASSESSMENT:  GENERAL OBSERVATION: No acute distress   SYMPTOM BEHAVIOR:  Subjective history: Symptoms started about 2 weeks ago, just walking down the sidewalk.  Things started being off balance when I stopped walking.  Have had vertigo before in the past.  Last episode was 6 months ago.  They typically go away on their own. Has hx of migraines-unsure of  triggers.  Non-Vestibular symptoms: migraine symptoms  Type of dizziness: Imbalance (Disequilibrium), Spinning/Vertigo, and Unsteady with head/body turns  Frequency: several times per day  Duration: minutes  Aggravating factors: Induced by position change: rolling to the right and Induced by motion: looking up at the ceiling, bending down to the ground, turning body quickly, and turning head quickly  Relieving factors: slow movements and sititng down  Progression of symptoms: better  OCULOMOTOR EXAM:  Ocular Alignment: normal and pt reports blurriness in R eye following cataract surgery  Ocular ROM: No Limitations  Spontaneous Nystagmus: absent  Gaze-Induced Nystagmus: absent  Smooth Pursuits: intact  Saccades: intact  Convergence/Divergence: 6 cm    VESTIBULAR - OCULAR REFLEX:   Slow VOR: Normal and Comment: R eye slightly slowed movement  VOR Cancellation: Comment: normal, but performs at slower pace  Head-Impulse Test: HIT Right: positive     POSITIONAL TESTING: Right Dix-Hallpike: upbeating, right nystagmus and Duration: 15 sec Left Dix-Hallpike: no nystagmus and wooziness upon sit Right Roll Test: no nystagmus and c/o "waviness"; c/o dizziness upon return  to sit Left Roll Test: no nystagmus   M-CTSIB  Condition 1: Firm Surface, EO 30 Sec, Mild Sway  Condition 2: Firm Surface, EC 11.97 Sec, Moderate and Severe Sway  Condition 3: Foam Surface, EO 30 Sec, Mild Sway  Condition 4: Foam Surface, EC 17.62 Sec, Moderate and Severe Sway     VESTIBULAR TREATMENT:                                                                                                   DATE: 12/07/2022  Canalith Repositioning:  Epley Right: Number of Reps: 1, Response to Treatment: comment: dizziness upon returning to sit, and Comment: Difficulty with adequate neck extension during Epley.  After maneuver, pt does report pain in neck during the movement patterns.  PATIENT EDUCATION: Education details: PT  eval results, POC Person educated: Patient Education method: Explanation and Demonstration Education comprehension: verbalized understanding  HOME EXERCISE PROGRAM:  GOALS: Goals reviewed with patient? Yes  SHORT TERM GOALS: Target date: 12/30/2022  Pt will be independent with HEP for improved dizziness. Baseline: Goal status: INITIAL  2.  Pt will perform positional testing maneuvers with no reports of dizziness, to demonstrate improved BPPV symptoms. Baseline:  Goal status: INITIAL  3.  Pt will perform full 30 seconds with moderate sway or less on MCTSIB conditions 2 and 4, for improved vestibular system use for balance. Baseline: unable to hold 30 seconds Goal status: INITIAL  LONG TERM GOALS: Target date: 01/13/2023  Pt will be independent with HEP for improved dizziness. Baseline:  Goal status: INITIAL  2.  FOTO score to improve to at least 59 to demo improved return to functional tasks without dizziness. Baseline:  Goal status: INITIAL  3.  Pt will report 0/10 dizziness with bed mobility and bending/looking up to ceiling tasks. Baseline:  Goal status: INITIAL  ASSESSMENT:  CLINICAL IMPRESSION: Patient is a 77 y.o. male who was seen today for physical therapy evaluation and treatment for vertigo, unsteadiness, with hx of migraines.  Pt reports having hx of migraines for years, but he has occasional episodes of vertigo.   Most recent episode started about 2 weeks ago, with noted unsteadiness, dysequilibrium after walking on a straight, level sidewalk.  He also reports spinning sensation with lying in bed to right.  He reports slowed movement patterns/avoidance of bending down or looking up as they bring on symptoms.  He does have hx of cataract surgery 3-4 years ago with residual blurriness in R eye.  With vestibular-ocular testing, he performs VOR and VOR cancellation slowly, with R eye not as coordinated with movements as L eye.  He has positive HIT test to Right side.   With positional testing, he has nystagmus and symptoms with R Dix-hallpike and he has symptoms with R roll test, though PT did not see nystagmus.  With both these positions upon returning to sit, he reports dizziness and wooziness.  He may have component of both positional vertigo and decreased vestibular system use/motion sensitivity.  He will benefit from further skilled PT sessions to fully address pt's c/o dizziness and unsteadiness for  return to full independent PLOF.  OBJECTIVE IMPAIRMENTS: Abnormal gait, decreased balance, and dizziness.   ACTIVITY LIMITATIONS: bending, squatting, bed mobility, reach over head, and locomotion level  PARTICIPATION LIMITATIONS: community activity  PERSONAL FACTORS: 3+ comorbidities: See above PMH  are also affecting patient's functional outcome.   REHAB POTENTIAL: Good  CLINICAL DECISION MAKING: Evolving/moderate complexity  EVALUATION COMPLEXITY: Moderate   PLAN:  PT FREQUENCY: 2x/week  PT DURATION: 4 weeks, then 1x/wk for 2 weeks  PLANNED INTERVENTIONS: Therapeutic exercises, Therapeutic activity, Neuromuscular re-education, Balance training, Gait training, Patient/Family education, Self Care, Vestibular training, Canalith repositioning, and Manual therapy  PLAN FOR NEXT SESSION: Re-assess positional vertigo and treat as needed; initiate HEP for positional vertigo, VOR as needed, habituation, balance.   Frazier Butt., PT 12/07/2022, 12:53 PM   [facility] at [facility], [place], [place] [address] Phone # [phone] Fax # [phone]

## 2022-12-08 ENCOUNTER — Ambulatory Visit: Payer: Medicare Other | Admitting: Physical Therapy

## 2022-12-08 ENCOUNTER — Encounter: Payer: Self-pay | Admitting: Physical Therapy

## 2022-12-08 DIAGNOSIS — R42 Dizziness and giddiness: Secondary | ICD-10-CM

## 2022-12-08 DIAGNOSIS — R2681 Unsteadiness on feet: Secondary | ICD-10-CM

## 2022-12-08 NOTE — Therapy (Signed)
OUTPATIENT PHYSICAL THERAPY VESTIBULAR TREATMENT NOTE     Patient Name: Robert [name] MRN: [id number] DOB:05-13-1946, 77 y.o., male Today's Date: 12/08/2022  END OF SESSION:  PT End of Session - 12/08/22 0756     Visit Number 2    Number of Visits 12    Date for PT Re-Evaluation 01/13/23    Authorization Type UHC Medicare    PT Start Time 0800    PT Stop Time 0833    PT Time Calculation (min) 33 min    Activity Tolerance Patient tolerated treatment well    Behavior During Therapy [facility] for tasks assessed/performed              Past Medical History:  Diagnosis Date   Arthritis    CAD (coronary artery disease) 10/10/2011   NSTEMI with DES to the proximal RCA following flow wire evaluation. EF is normal.    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   CORONARY ANGIOPLASTY WITH STENT PLACEMENT  10/13/2011   DES to the proximal RCA. Dr Martinique   FRACTIONAL FLOW RESERVE WIRE Right 10/13/2011   Procedure: [place];  Surgeon: Peter M Martinique, MD;  Location: [facility] CATH LAB;  Service: Cardiovascular;  Laterality: Right;   HERNIA REPAIR     INGUINAL HERNIA REPAIR  10/23/2012   Procedure: HERNIA REPAIR INGUINAL ADULT;  Surgeon: Odis Hollingshead, MD;  Location: [place];  Service: General;  Laterality: Left;  Left lower quadrant; left inguinal hernia repair with mesh   INSERTION OF MESH  10/23/2012   Procedure:  INSERTION OF MESH;  Surgeon: Odis Hollingshead, MD;  Location: [place];  Service: General;  Laterality: Left;  Left lower quadrant   LEFT HEART CATHETERIZATION WITH CORONARY ANGIOGRAM N/A 10/13/2011   Procedure: LEFT HEART CATHETERIZATION WITH CORONARY ANGIOGRAM;  Surgeon: Peter M Martinique, MD;  Location[facility] C[facility] LAB;  Service: Cardiovascular;  Laterality: N/A;   PERCUTANEOUS CORONARY STENT INTERVENTION (PCI-S) Right 10/13/2011   Procedure: PERCUTANEOUS CORONARY STENT INTERVENTION (PCI-S);  Surgeon: Peter M Martinique, MD;  Location: [facility] CATH LAB;  Service: Cardiovascular;  Laterality: Right;   SHOULDER ARTHROSCOPY W/ ROTATOR CUFF REPAIR Left 1990's   TOTAL HIP ARTHROPLASTY Left 05/14/2021   Procedure: LEFT TOTAL HIP ARTHROPLASTY ANTERIOR APPROACH;  Surgeon: Mcarthur Rossetti, MD;  Location: [facility];  Service: Orthopedics;  Laterality: Left;   TRIGGER FINGER RELEASE Left    3rd and 4th digits   Patient Active Problem List   Diagnosis Date Noted   Acute medial meniscus tear of right knee 05/30/2022   Chronic migraine without aura, with intractable migraine, so stated, with status migrainosus 09/20/2021   Status post total replacement of left hip 05/14/2021   Type 2 diabetes mellitus with hyperglycemia ([place]) 05/23/2018   Chronically dry eyes, bilateral 05/23/2018   Migraines  11/28/2016   Hypertension associated with diabetes ([place]) 10/11/2011   History of basal cell carcinoma (BCC), right ear 03/03/2009   Hyperlipidemia associated with type 2 diabetes mellitus ([place]) 01/11/2008   Allergic rhinitis 01/07/2008   Prostatitis, recurrent 01/07/2008   History of gastric ulcer 01/07/2008   History of colon polyps, followed by Dr. Earlean Shawl 01/07/2008   GERD 04/02/2007   Sleep apnea 04/02/2007    PCP: Dimas Chyle, MD REFERRING PROVIDER: Suzzanne Cloud, NP   REFERRING DIAG: [phone] (ICD-10-CM) - Chronic migraine without aura, with intractable migraine, so stated, with status migrainosus  THERAPY DIAG:   Dizziness and giddiness  Unsteadiness on feet  ONSET DATE: 11/30/2021 (MD referral)  Rationale for Evaluation and Treatment: Rehabilitation  SUBJECTIVE:   SUBJECTIVE STATEMENT: Felt pretty good yesterday until I went to bed, and going down to right side brings on spinning (7-8/10) Pt accompanied by: self  PERTINENT HISTORY: Hx of migraines, on medications; received Botox shot at [facility] for migraines 12/06/2022  PAIN:  Are you having pain? Yes: NPRS scale: 0/10 Pain location: headache Pain description: migraine Aggravating factors: unsure Relieving factors: new medication, Nurtec helps  PRECAUTIONS: Fall and Other: hx of migraines, received Botox 12/06/2022  FALLS: Has patient fallen in last 6 months? No  LIVING ENVIRONMENT: Lives with: lives with their spouse Lives in: House/apartment   PLOF: Independent.  Enjoyed playing golf in the past; is scheduled for knee replacement in April  PATIENT GOALS: Would like to improve my symptoms  OBJECTIVE:   TODAY'S TREATMENT: 12/08/2022 Activity Comments  L Dix Hallpike negative  R Dix Hallpike R torsional upbeating nystagmus, 10-15 seconds  R Epley, 2 trials Spinning in initial position, 10-15 seconds, 7-8/10 symptoms upon return to sit 1st trial; 2nd trial:  several beats of nystagmus, less reports of spinning; no symptoms upon return to sit  Educated patient in BPPV and repositioning, movement patterns as normal for today and going forward.             PATIENT EDUCATION: Education details: Results of Epley, vestibular system anatomy and rationale for BPPV testing and treatment.  Discussed/reviewed results from PT eval indicating need to address balance in addition to BPPV Person educated: Patient Education method: Explanation Education comprehension: verbalized understanding  -------------------------------------------------------------------------------- Objective measures below taken at initial evaluation:  DIAGNOSTIC  FINDINGS: scheduled for head MRI in February  COGNITION: Overall cognitive status: Within functional limits for tasks assessed   POSTURE:  No Significant postural limitations  Cervical ROM:  slight limitations with cervical extension, rotation-pain due to arthritis   TRANSFERS: Assistive device utilized: None  Sit to stand: Modified independence Stand to sit: Modified independence  PATIENT SURVEYS:  FOTO Intake score:  39, predicted 59  VESTIBULAR ASSESSMENT:  GENERAL OBSERVATION: No acute distress   SYMPTOM BEHAVIOR:  Subjective history: Symptoms started about 2 weeks ago, just walking down the sidewalk.  Things started being off balance when I stopped walking.  Have had vertigo before in the past.  Last episode was 6 months ago.  They typically go away on their own. Has hx of migraines-unsure of triggers.  Non-Vestibular symptoms: migraine symptoms  Type of dizziness: Imbalance (Disequilibrium), Spinning/Vertigo, and Unsteady with head/body turns  Frequency: several times per day  Duration: minutes  Aggravating factors: Induced by position change: rolling to the right and Induced by motion: looking up at the ceiling, bending down to the ground, turning body quickly, and turning head quickly  Relieving factors: slow movements and sititng down  Progression  of symptoms: better  OCULOMOTOR EXAM:  Ocular Alignment: normal and pt reports blurriness in R eye following cataract surgery  Ocular ROM: No Limitations  Spontaneous Nystagmus: absent  Gaze-Induced Nystagmus: absent  Smooth Pursuits: intact  Saccades: intact  Convergence/Divergence: 6 cm    VESTIBULAR - OCULAR REFLEX:   Slow VOR: Normal and Comment: R eye slightly slowed movement  VOR Cancellation: Comment: normal, but performs at slower pace  Head-Impulse Test: HIT Right: positive     POSITIONAL TESTING: Right Dix-Hallpike: upbeating, right nystagmus and Duration: 15 sec Left Dix-Hallpike: no nystagmus and  wooziness upon sit Right Roll Test: no nystagmus and c/o "waviness"; c/o dizziness upon return to sit Left Roll Test: no nystagmus   M-CTSIB  Condition 1: Firm Surface, EO 30 Sec, Mild Sway  Condition 2: Firm Surface, EC 11.97 Sec, Moderate and Severe Sway  Condition 3: Foam Surface, EO 30 Sec, Mild Sway  Condition 4: Foam Surface, EC 17.62 Sec, Moderate and Severe Sway     VESTIBULAR TREATMENT:                                                                                                   DATE: 12/07/2022  Canalith Repositioning:  Epley Right: Number of Reps: 1, Response to Treatment: comment: dizziness upon returning to sit, and Comment: Difficulty with adequate neck extension during Epley.  After maneuver, pt does report pain in neck during the movement patterns.  PATIENT EDUCATION: Education details: PT eval results, POC Person educated: Patient Education method: Explanation and Demonstration Education comprehension: verbalized understanding  HOME EXERCISE PROGRAM:  GOALS: Goals reviewed with patient? Yes  SHORT TERM GOALS: Target date: 12/30/2022  Pt will be independent with HEP for improved dizziness. Baseline: Goal status: IN PROGRESS  2.  Pt will perform positional testing maneuvers with no reports of dizziness, to demonstrate improved BPPV symptoms. Baseline:  Goal status: IN PROGRESS  3.  Pt will perform full 30 seconds with moderate sway or less on MCTSIB conditions 2 and 4, for improved vestibular system use for balance. Baseline: unable to hold 30 seconds Goal status: IN PROGRESS  LONG TERM GOALS: Target date: 01/13/2023  Pt will be independent with HEP for improved dizziness. Baseline:  Goal status: IN PROGRESS  2.  FOTO score to improve to at least 59 to demo improved return to functional tasks without dizziness. Baseline:  Goal status: IN PROGRESS  3.  Pt will report 0/10 dizziness with bed mobility and bending/looking up to ceiling tasks. Baseline:   Goal status: IN PROGRESS  ASSESSMENT:  CLINICAL IMPRESSION: Pt returns today to [place], with reports of feeling good yesterday until getting in bed last night and waking up this morning with spinning sensations as he goes to lie down to R side or rolls to right side.  Reassessed positional vertigo today, with pt positive for nystagmus and spinning on R Dix-Hallpike, indicating likely R posterior canal involvement.  Performed Epley maneuver for canalith repositioning x 2 reps, with pt improving to no symptoms upon sitting on second rep.  Will continue to follow patient and reassess for  BPPV next visit, and also will begin to work on balance exercises to address balance deficits noted at eval.    OBJECTIVE IMPAIRMENTS: Abnormal gait, decreased balance, and dizziness.   ACTIVITY LIMITATIONS: bending, squatting, bed mobility, reach over head, and locomotion level  PARTICIPATION LIMITATIONS: community activity  PERSONAL FACTORS: 3+ comorbidities: See above PMH  are also affecting patient's functional outcome.   REHAB POTENTIAL: Good  CLINICAL DECISION MAKING: Evolving/moderate complexity  EVALUATION COMPLEXITY: Moderate   PLAN:  PT FREQUENCY: 2x/week  PT DURATION: 4 weeks, then 1x/wk for 2 weeks  PLANNED INTERVENTIONS: Therapeutic exercises, Therapeutic activity, Neuromuscular re-education, Balance training, Gait training, Patient/Family education, Self Care, Vestibular training, Canalith repositioning, and Manual therapy  PLAN FOR NEXT SESSION: Re-assess positional vertigo and treat as needed; initiate HEP for Brandt-Daroff if appropriate, VOR as needed, habituation, balance.   Frazier Butt., PT 12/08/2022, 8:40 AM   [facility] at [facility] [address], [place], [place] [address] Phone # [phone] Fax # [phone]

## 2022-12-13 ENCOUNTER — Ambulatory Visit: Payer: Medicare Other | Admitting: Physical Therapy

## 2022-12-13 ENCOUNTER — Encounter: Payer: Self-pay | Admitting: Physical Therapy

## 2022-12-13 DIAGNOSIS — R42 Dizziness and giddiness: Secondary | ICD-10-CM

## 2022-12-13 DIAGNOSIS — R2681 Unsteadiness on feet: Secondary | ICD-10-CM

## 2022-12-13 NOTE — Therapy (Signed)
OUTPATIENT PHYSICAL THERAPY VESTIBULAR TREATMENT NOTE     Patient Name: Robert [name] MRN: [id number] DOB:December 18, 1945, 77 y.o., male Today's Date: 12/13/2022  END OF SESSION:  PT End of Session - 12/13/22 0759     Visit Number 3    Number of Visits 12    Date for PT Re-Evaluation 01/13/23    Authorization Type [employer] Medicare    PT Start Time 0804    PT Stop Time 0835    PT Time Calculation (min) 31 min    Activity Tolerance Patient tolerated treatment well    Behavior During Therapy [facility] for tasks assessed/performed              Past Medical History:  Diagnosis Date   Arthritis    CAD (coronary artery disease) 10/10/2011   NSTEMI with DES to the proximal RCA following flow wire evaluation. EF is normal.    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   CORONARY ANGIOPLASTY WITH STENT PLACEMENT  10/13/2011   DES to the proximal RCA. Dr Martinique   FRACTIONAL FLOW RESERVE WIRE Right 10/13/2011   Procedure: [place];  Surgeon: Peter M Martinique, MD;  Location: [facility] CATH LAB;  Service: Cardiovascular;  Laterality: Right;   HERNIA REPAIR     INGUINAL HERNIA REPAIR  10/23/2012   Procedure: HERNIA REPAIR INGUINAL ADULT;  Surgeon: Odis Hollingshead, MD;  Location: [place];  Service: General;  Laterality: Left;  Left lower quadrant; left inguinal hernia repair with mesh   INSERTION OF MESH  10/23/2012   Procedure:  INSERTION OF MESH;  Surgeon: Odis Hollingshead, MD;  Location: [place];  Service: General;  Laterality: Left;  Left lower quadrant   LEFT HEART CATHETERIZATION WITH CORONARY ANGIOGRAM N/A 10/13/2011   Procedure: LEFT HEART CATHETERIZATION WITH CORONARY ANGIOGRAM;  Surgeon: Peter M Martinique, MD;  Location: [facility] CATH LAB;  Service: Cardiovascular;  Laterality: N/A;   PERCUTANEOUS CORONARY STENT INTERVENTION (PCI-S) Right 10/13/2011   Procedure: PERCUTANEOUS CORONARY STENT INTERVENTION (PCI-S);  Surgeon: Peter M Martinique, MD;  Location: [facility] CATH LAB;  Service: Cardiovascular;  Laterality: Right;   SHOULDER ARTHROSCOPY W/ ROTATOR CUFF REPAIR Left 1990's   TOTAL HIP ARTHROPLASTY Left 05/14/2021   Procedure: LEFT TOTAL HIP ARTHROPLASTY ANTERIOR APPROACH;  Surgeon: Mcarthur Rossetti, MD;  Location: [facility];  Service: Orthopedics;  Laterality: Left;   TRIGGER FINGER RELEASE Left    3rd and 4th digits   Patient Active Problem List   Diagnosis Date Noted   Acute medial meniscus tear of right knee 05/30/2022   Chronic migraine without aura, with intractable migraine, so stated, with status migrainosus 09/20/2021   Status post total replacement of left hip 05/14/2021   Type 2 diabetes mellitus with hyperglycemia ([place]) 05/23/2018   Chronically dry eyes, bilateral 05/23/2018   Migraines  11/28/2016   Hypertension associated with diabetes ([place]) 10/11/2011   History of basal cell carcinoma (BCC), right ear 03/03/2009   Hyperlipidemia associated with type 2 diabetes mellitus ([place]) 01/11/2008   Allergic rhinitis 01/07/2008   Prostatitis, recurrent 01/07/2008   History of gastric ulcer 01/07/2008   History of colon polyps, followed by Dr. Earlean Shawl 01/07/2008   GERD 04/02/2007   Sleep apnea 04/02/2007    PCP: Dimas Chyle, MD REFERRING PROVIDER: Suzzanne Cloud, NP   REFERRING DIAG: [phone] (ICD-10-CM) - Chronic migraine without aura, with intractable migraine, so stated, with status migrainosus  THERAPY DIAG:   Dizziness and giddiness  Unsteadiness on feet  ONSET DATE: 11/30/2021 (MD referral)  Rationale for Evaluation and Treatment: Rehabilitation  SUBJECTIVE:   SUBJECTIVE STATEMENT: I do okay, until I lie down or roll to the right.  It might be a little better.  Pt rates spinning as 4/10.  Pt accompanied by: self  PERTINENT HISTORY: Hx of migraines, on medications; received Botox shot at [facility] for migraines 12/06/2022  PAIN:  Are you having pain? Yes: NPRS scale: 0/10 Pain location: headache Pain description: migraine Aggravating factors: unsure Relieving factors: new medication, Nurtec helps  PRECAUTIONS: Fall and Other: hx of migraines, received Botox 12/06/2022  FALLS: Has patient fallen in last 6 months? No  LIVING ENVIRONMENT: Lives with: lives with their spouse Lives in: House/apartment   PLOF: Independent.  Enjoyed playing golf in the past; is scheduled for knee replacement in April  PATIENT GOALS: Would like to improve my symptoms  OBJECTIVE:    TODAY'S TREATMENT: 12/13/2022 Activity Comments  R Dix Hallpike No nystagmus, no symptoms  Performed R Epley,to complete the motion Dizziness approx 1 minute upon sitting  R sidelying test positive Rates symptoms as 8/10 upon sidelying with nystagmus noted  Brandt-Daroff x 1 reps No symptoms sidelying, but slight symptoms upon return to sit  Semont maneuver to R side, performed x 1 No symptoms R sidelying, minimal c/o lightheadedness upon return to sit        PATIENT EDUCATION: Education details: HEP-added R Brandt-Daroff, educated to start 12/14/2022, rationale for Advertising account executive and semicircular canal anatomy Person educated: Patient Education method: Explanation, Demonstration, Verbal cues, and Handouts Education comprehension: verbalized understanding, returned demonstration, verbal cues required, and needs further education  Access Code: [email or web address] URL: [email or web address] Date: 12/13/2022 Prepared  by: [facility]  Exercises - Brandt-Daroff Vestibular Exercise  - 1-2 x daily - 7 x weekly - 1 sets - 3 reps - 30 sec hold  -------------------------------------------------------------------------------- Objective measures below taken at initial evaluation:  DIAGNOSTIC FINDINGS: scheduled for head MRI in February  COGNITION: Overall cognitive status: Within functional limits for tasks assessed   POSTURE:  No Significant postural limitations  Cervical ROM:  slight limitations with cervical extension, rotation-pain due to arthritis   TRANSFERS: Assistive device utilized: None  Sit to stand: Modified independence Stand to sit: Modified independence  PATIENT SURVEYS:  FOTO Intake score:  39, predicted 59  VESTIBULAR ASSESSMENT:  GENERAL OBSERVATION: No acute distress   SYMPTOM BEHAVIOR:  Subjective history: Symptoms started about 2 weeks ago, just walking down the sidewalk.  Things started being off balance when I stopped walking.  Have had vertigo before in the past.  Last episode was 6 months ago.  They typically go away on their own. Has hx of migraines-unsure of triggers.  Non-Vestibular symptoms: migraine symptoms  Type of dizziness: Imbalance (Disequilibrium), Spinning/Vertigo, and Unsteady with head/body turns  Frequency: several times per day  Duration: minutes  Aggravating factors: Induced by position change: rolling to the right and Induced by motion: looking up at the ceiling, bending down to the ground, turning body quickly, and turning head quickly  Relieving factors: slow movements and sititng down  Progression of symptoms: better  OCULOMOTOR EXAM:  Ocular Alignment: normal and pt reports blurriness in R eye following cataract surgery  Ocular ROM: No Limitations  Spontaneous Nystagmus: absent  Gaze-Induced Nystagmus: absent  Smooth Pursuits: intact  Saccades: intact  Convergence/Divergence: 6 cm    VESTIBULAR - OCULAR  REFLEX:   Slow VOR: Normal and Comment: R eye slightly slowed movement  VOR Cancellation: Comment: normal, but performs at slower pace  Head-Impulse Test: HIT Right: positive     POSITIONAL TESTING: Right Dix-Hallpike: upbeating, right nystagmus and Duration: 15 sec Left Dix-Hallpike: no nystagmus and wooziness upon sit Right Roll Test: no nystagmus and c/o "waviness"; c/o dizziness upon return to sit Left Roll Test: no nystagmus   M-CTSIB  Condition 1: Firm Surface, EO 30 Sec, Mild Sway  Condition 2: Firm Surface, EC 11.97 Sec, Moderate and Severe Sway  Condition 3: Foam Surface, EO 30 Sec, Mild Sway  Condition 4: Foam Surface, EC 17.62 Sec, Moderate and Severe Sway     VESTIBULAR TREATMENT:                                                                                                   DATE: 12/07/2022  Canalith Repositioning:  Epley Right: Number of Reps: 1, Response to Treatment: comment: dizziness upon returning to sit, and Comment: Difficulty with adequate neck extension during Epley.  After maneuver, pt does report pain in neck during the movement patterns.  PATIENT EDUCATION: Education details: PT eval results, POC Person educated: Patient Education method: Explanation and Demonstration Education comprehension: verbalized understanding  HOME EXERCISE PROGRAM:  GOALS: Goals reviewed with patient? Yes  SHORT TERM GOALS: Target date: 12/30/2022  Pt will be independent with HEP for improved dizziness. Baseline: Goal status: IN PROGRESS  2.  Pt will perform positional testing maneuvers with no reports of dizziness, to demonstrate improved BPPV symptoms. Baseline:  Goal status: IN PROGRESS  3.  Pt will perform full 30 seconds with moderate sway or less on MCTSIB conditions 2 and 4, for improved vestibular system use for balance. Baseline: unable to hold 30 seconds Goal status: IN PROGRESS  LONG TERM GOALS: Target date: 01/13/2023  Pt will be independent with HEP for  improved dizziness. Baseline:  Goal status: IN PROGRESS  2.  FOTO score to improve to at least 59 to demo improved return to functional tasks without dizziness. Baseline:  Goal status: IN PROGRESS  3.  Pt will report 0/10 dizziness with bed mobility and bending/looking up to ceiling tasks. Baseline:  Goal status: IN PROGRESS  ASSESSMENT:  CLINICAL IMPRESSION: Pt presents to OPPT today, reporting that dizziness with rolling to R and lying down to R side is still there, but slightly better than last week (rates as 4/10 compared to 7-8/10 last visit).  Initially with R Dix-Hallpike  and Epley, no symptoms or nystagmus until pt returns to sit.  Pt continues to have significant limitations in neck extension during testing/treatment.  Performed Semont maneuver to treat R posterior canal BPPV, with pt tolerating well with slightly slowed movement during maneuver. With sidelying test/Brandt-Daroff maneuver prior to Semont, pt does report improved symptoms each rep, so pt will likely tolerate habituation exercises well.  Will continue to follow patient and reassess for BPPV next visit, and also will begin to work on balance exercises to address balance deficits as able.    OBJECTIVE IMPAIRMENTS: Abnormal gait, decreased balance, and dizziness.   ACTIVITY LIMITATIONS: bending, squatting, bed mobility, reach over head, and locomotion level  PARTICIPATION LIMITATIONS: community activity  PERSONAL FACTORS: 3+ comorbidities: See above PMH  are also affecting patient's functional outcome.   REHAB POTENTIAL: Good  CLINICAL DECISION MAKING: Evolving/moderate complexity  EVALUATION COMPLEXITY: Moderate   PLAN:  PT FREQUENCY: 2x/week  PT DURATION: 4 weeks, then 1x/wk for 2 weeks  PLANNED INTERVENTIONS: Therapeutic exercises, Therapeutic activity, Neuromuscular re-education, Balance training, Gait training, Patient/Family education, Self Care, Vestibular training, Canalith repositioning, and Manual  therapy  PLAN FOR NEXT SESSION: Re-assess positional vertigo and treat as needed; review or ask how Brandt-Daroff went at home, habituation, balance, VOR as needed.   Frazier Butt., PT 12/13/2022, 8:38 AM   [facility] [place], [place], [place] [address] Phone # [phone] Fax # [phone]

## 2022-12-14 ENCOUNTER — Telehealth: Payer: Self-pay | Admitting: *Deleted

## 2022-12-14 NOTE — Telephone Encounter (Signed)
Patient need OV for surgery clearance with PCP  Please schedule

## 2022-12-15 ENCOUNTER — Ambulatory Visit: Payer: Medicare Other | Admitting: Physical Therapy

## 2022-12-16 ENCOUNTER — Encounter: Payer: Self-pay | Admitting: Family Medicine

## 2022-12-16 ENCOUNTER — Telehealth: Payer: Self-pay | Admitting: *Deleted

## 2022-12-16 ENCOUNTER — Ambulatory Visit (INDEPENDENT_AMBULATORY_CARE_PROVIDER_SITE_OTHER): Payer: Medicare Other | Admitting: Family Medicine

## 2022-12-16 VITALS — BP 118/66 | HR 58 | Temp 97.5°F | Ht 73.0 in | Wt 208.6 lb

## 2022-12-16 DIAGNOSIS — I152 Hypertension secondary to endocrine disorders: Secondary | ICD-10-CM

## 2022-12-16 DIAGNOSIS — M1990 Unspecified osteoarthritis, unspecified site: Secondary | ICD-10-CM | POA: Insufficient documentation

## 2022-12-16 DIAGNOSIS — E1159 Type 2 diabetes mellitus with other circulatory complications: Secondary | ICD-10-CM | POA: Diagnosis not present

## 2022-12-16 DIAGNOSIS — E1165 Type 2 diabetes mellitus with hyperglycemia: Secondary | ICD-10-CM

## 2022-12-16 DIAGNOSIS — E1169 Type 2 diabetes mellitus with other specified complication: Secondary | ICD-10-CM

## 2022-12-16 DIAGNOSIS — E785 Hyperlipidemia, unspecified: Secondary | ICD-10-CM

## 2022-12-16 DIAGNOSIS — M179 Osteoarthritis of knee, unspecified: Secondary | ICD-10-CM | POA: Insufficient documentation

## 2022-12-16 NOTE — Progress Notes (Signed)
Robert [name] is a 77 y.o. male who presents today for an office visit.  Assessment/Plan:  New/Acute Problems: Encounter for Surgical Clearance Overall low risk.  He is medically optimized.  Cardiac clearance is pending. His last A1c was at goal.  It is too early to recheck.  Will fax over clearance form in this office note to requesting surgeon's office.  Chronic Problems Addressed Today: Type 2 diabetes mellitus with hyperglycemia ([name]) Last A1c 6.9.  Too early to recheck today.  Doing well metformin 1000 mg twice daily.  Home sugars typically in the 130 range.  He will come back in a few months and we can recheck A1c at that time.  Osteoarthritis Following with orthopedics we will be having upcoming right medial compartment partial arthroplasty and about a month or so.  Hyperlipidemia associated with type 2 diabetes mellitus ([name]) Stable on Crestor 40 mg daily and Zetia 5 mg daily.  Will check lipids next labs.  Hypertension associated with diabetes ([place]) Blood pressure at goal today on Metroprolol tartrate 12.5 mg daily.     Subjective:  HPI:  Patient here today for surgical clearance. Will be having a partial right knee replacement by Dr Maureen Ralphs in about a month or so.  He has a longstanding history of pain in his right knee.  He did have arthroscopic surgery for meniscal tear about a year ago however pain has persisted.  He has failed conservative management including steroid injections and NSAIDs.  Pain significantly limits activities of daily living.  He is able to climb a flight of stairs without any sort of chest pain or shortness of breath.  Sugars have been well-controlled.  PMH:  The following were reviewed and entered/updated in epic: Past Medical History:  Diagnosis Date   Arthritis    CAD (coronary artery disease) 10/10/2011   NSTEMI with DES to the proximal RCA following flow wire evaluation. EF is normal.    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    "left"   Squamous cell carcinoma 12/17/2016   Patient Active Problem List   Diagnosis Date Noted   Osteoarthritis 12/16/2022   Acute medial meniscus tear of right knee 05/30/2022   Chronic migraine without aura, with intractable migraine, so stated, with status migrainosus 09/20/2021   Status post total replacement of left hip 05/14/2021   Type 2 diabetes mellitus with hyperglycemia ([place]) 05/23/2018   Chronically dry eyes, bilateral 05/23/2018   Migraines 11/28/2016   Hypertension associated with diabetes ([place]) 10/11/2011   History of basal cell carcinoma ([place]), right ear 03/03/2009   Hyperlipidemia associated with type 2 diabetes mellitus ([place]) 01/11/2008   Allergic rhinitis 01/07/2008   Prostatitis, recurrent 01/07/2008   History of gastric ulcer 01/07/2008   History of colon polyps, followed by Dr. Earlean Shawl 01/07/2008   GERD 04/02/2007   Sleep apnea 04/02/2007   Past Surgical History:  Procedure Laterality Date   BACK SURGERY     CATARACT EXTRACTION Bilateral 2019   CATARACT EXTRACTION W/ INTRAOCULAR LENS IMPLANT Bilateral 01/2019   CERVICAL DISC SURGERY  1980's?   CORONARY ANGIOPLASTY  WITH STENT PLACEMENT  10/13/2011   DES to the proximal RCA. Dr Martinique   FRACTIONAL FLOW RESERVE WIRE Right 10/13/2011   Procedure: [place];  Surgeon: Peter M Martinique, MD;  Location: [facility] CATH LAB;  Service: Cardiovascular;  Laterality: Right;   HERNIA REPAIR     INGUINAL HERNIA REPAIR  10/23/2012   Procedure: HERNIA REPAIR INGUINAL ADULT;  Surgeon: Odis Hollingshead, MD;  Location: [place];  Service: General;  Laterality: Left;  Left lower quadrant; left inguinal hernia repair with  mesh   INSERTION OF MESH  10/23/2012   Procedure: INSERTION OF MESH;  Surgeon: Odis Hollingshead, MD;  Location: [place];  Service: General;  Laterality: Left;  Left lower quadrant   LEFT HEART CATHETERIZATION WITH CORONARY ANGIOGRAM N/A 10/13/2011   Procedure: LEFT HEART CATHETERIZATION WITH CORONARY ANGIOGRAM;  Surgeon: Peter M Martinique, MD;  Location: [facility] CATH LAB;  Service: Cardiovascular;  Laterality: N/A;   PERCUTANEOUS CORONARY STENT INTERVENTION (PCI-S) Right 10/13/2011   Procedure: PERCUTANEOUS CORONARY STENT INTERVENTION (PCI-S);  Surgeon: Peter M Martinique, MD;  Location: [facility] CATH LAB;  Service: Cardiovascular;  Laterality: Right;   SHOULDER ARTHROSCOPY W/ ROTATOR CUFF REPAIR Left 1990's   TOTAL HIP ARTHROPLASTY Left 05/14/2021   Procedure: LEFT TOTAL HIP ARTHROPLASTY ANTERIOR APPROACH;  Surgeon: Mcarthur Rossetti, MD;  Location: [facility];  Service: Orthopedics;  Laterality: Left;   TRIGGER FINGER RELEASE Left    3rd and 4th digits    Family History  Problem Relation Age of Onset   Cancer Mother        lung   Heart disease Father        heart attack    Cancer Father        colon   Migraines Neg Hx    Headache Neg Hx     Medications- reviewed and updated Current Outpatient Medications  Medication Sig Dispense Refill   acetaminophen (TYLENOL) 500 MG tablet Take 1,000 mg by mouth every 6 (six) hours as needed for moderate pain.     Ascorbic Acid (VITAMIN C) 1000 MG tablet Take 1,000 mg by mouth daily.     ASPIRIN 81 PO Take 81 mg by mouth daily.     azelastine (ASTELIN) 0.1 % nasal spray Place 1 spray into both nostrils daily as needed for rhinitis.     B Complex-C (SUPER B COMPLEX PO) Take 1 tablet by mouth daily.     brimonidine (ALPHAGAN) 0.2 % ophthalmic solution Place 1 drop into the right eye in the morning and at bedtime.     dicyclomine (BENTYL) 10 MG capsule TAKE 1 CAPSULE (10 MG TOTAL) BY MOUTH 4 (FOUR) TIMES DAILY   BEFORE MEALS AND AT BEDTIME.     ezetimibe (ZETIA) 10  MG tablet Take 0.5 tablets by mouth daily.     fluticasone (FLONASE) 50 MCG/ACT nasal spray Place 1 spray into both nostrils daily as needed for allergies.     Fremanezumab-vfrm (AJOVY) 225 MG/1.5ML SOAJ Inject 225 mg into the skin every 30 (thirty) days. 1.68 mL 11   latanoprost (XALATAN) 0.005 % ophthalmic solution Place 1 drop into the right eye at bedtime.     metFORMIN (GLUCOPHAGE) 500 MG tablet TAKE 1 TABLET BY MOUTH EVERY DAY WITH BREAKFAST 30 tablet 3   metoprolol tartrate (LOPRESSOR) 25 MG tablet Take 12.5 mg by mouth daily.     Multiple Vitamins-Minerals (MULTIVITAMINS THER. W/MINERALS) TABS Take 1 tablet by mouth daily.     nitroGLYCERIN (NITROSTAT)  0.4 MG SL tablet Place 1 tablet (0.4 mg total) under the tongue every 5 (five) minutes as needed for chest pain. Do not exceed more than 3 doses per day. 25 tablet 11   pantoprazole (PROTONIX) 40 MG tablet Take 40 mg by mouth daily.     prednisoLONE acetate (PRED FORTE) 1 % ophthalmic suspension      Rimegepant Sulfate (NURTEC) 75 MG TBDP Take 75 mg by mouth daily as needed. For migraines. Take as close to onset of migraine as possible. One daily maximum. 10 tablet 6   rosuvastatin (CRESTOR) 40 MG tablet Take 40 mg by mouth daily.     tamsulosin (FLOMAX) 0.4 MG CAPS capsule TAKE 1 CAPSULE BY MOUTH EVERY DAY 30 capsule 1   traMADol (ULTRAM) 50 MG tablet TAKE TWO TABLETS BY MOUTH FOUR TIMES A DAY AS NEEDED FOR RELIEF OF MIGRAINE     vitamin E 180 MG (400 UNITS) capsule Take 400 Units by mouth daily.     zinc gluconate 50 MG tablet Take 50 mg by mouth daily.     No current facility-administered medications for this visit.    Allergies-reviewed and updated Allergies  Allergen Reactions   Sumatriptan Shortness Of Breath    REACTION: sob, headache scalp sensitivity   Penicillins Itching   Brinzolamide-Brimonidine     Other reaction(s): Itching of eye   Lipitor [Atorvastatin] Nausea And Vomiting   Oxycodone Itching    Social History    Socioeconomic History   Marital status: Married    Spouse name: Not on file   Number of children: Not on file   Years of education: Not on file   Highest education level: 12th grade  Occupational History    Comment: Retired -[employer]  Tobacco Use   Smoking status: Former    Packs/day: 2.00    Years: 20.00    Total pack years: 40.00    Types: Cigarettes    Quit date: 11/14/1982    Years since quitting: 40.1   Smokeless tobacco: Never  Vaping Use   Vaping Use: Never used  Substance and Sexual Activity   Alcohol use: Not Currently    Comment: occ   Drug use: No   Sexual activity: Not on file  Other Topics Concern   Not on file  Social History Narrative   Pt lives with his wife. 1 and a half story home  No issues with stairs. 12th grade education   Social Determinants of Health   Financial Resource Strain: Low Risk  (09/15/2022)   Overall Financial Resource Strain (CARDIA)    Difficulty of Paying Living Expenses: Not hard at all  Food Insecurity: No Food Insecurity (09/15/2022)   Hunger Vital Sign    Worried About Running Out of Food in the Last Year: Never true    Ran Out of Food in the Last Year: Never true  Transportation Needs: No Transportation Needs (09/15/2022)   PRAPARE - Hydrologist (Medical): No    Lack of Transportation (Non-Medical): No  Physical Activity: Inactive (09/15/2022)   Exercise Vital Sign    Days of Exercise per Week: 0 days    Minutes of Exercise per Session: 0 min  Stress: No Stress Concern Present (09/15/2022)   [place]    Feeling of Stress : Not at all  Social Connections: Moderately Integrated (09/15/2022)   Social Connection and Isolation Panel [NHANES]    Frequency of Communication with Friends  and Family: Three times a week    Frequency of Social Gatherings with Friends and Family: More than three times a week    Attends Religious Services: Never     Marine scientist or Organizations: Yes    Attends Music therapist: 1 to 4 times per year    Marital Status: Married          Objective:  Physical Exam: BP 118/66   Pulse (!) 58   Temp (!) 97.5 F (36.4 C) (Temporal)   Ht '6\' 1"'$  (1.854 m)   Wt 208 lb 9.6 oz (94.6 kg)   SpO2 96%   BMI 27.52 kg/m   Gen: No acute distress, resting comfortably CV: Regular rate and rhythm with no murmurs appreciated Pulm: Normal work of breathing, clear to auscultation bilaterally with no crackles, wheezes, or rhonchi Neuro: Grossly normal, moves all extremities Psych: Normal affect and thought content      Hermon Zea M. Jerline Pain, MD 12/16/2022 8:34 AM

## 2022-12-16 NOTE — Assessment & Plan Note (Signed)
Following with orthopedics we will be having upcoming right medial compartment partial arthroplasty and about a month or so.

## 2022-12-16 NOTE — Patient Instructions (Signed)
It was very nice to see you today!  I think you do great with your surgery.  We will fax over clearance form to your surgeon.  Please come back in a few months after your surgery to recheck your blood sugar.  Please come back to see [place] and was needed.  Take care, Dr Jerline Pain  PLEASE NOTE:  If you had any lab tests, please let us know if you have not heard back within a few days. You may see your results on mychart before we have a chance to review them but we will give you a call once they are reviewed by [place].   If we ordered any referrals today, please let us know if you have not heard from their office within the next week.   If you had any urgent prescriptions sent in today, please check with the pharmacy within an hour of our visit to make sure the prescription was transmitted appropriately.   Please try these tips to maintain a healthy lifestyle:  Eat at least 3 REAL meals and 1-2 snacks per day.  Aim for no more than 5 hours between eating.  If you eat breakfast, please do so within one hour of getting up.   Each meal should contain half fruits/vegetables, one quarter protein, and one quarter carbs (no bigger than a computer mouse)  Cut down on sweet beverages. This includes juice, soda, and sweet tea.   Drink at least 1 glass of water with each meal and aim for at least 8 glasses per day  Exercise at least 150 minutes every week.

## 2022-12-16 NOTE — Assessment & Plan Note (Signed)
Blood pressure at goal today on Metroprolol tartrate 12.5 mg daily.

## 2022-12-16 NOTE — Assessment & Plan Note (Signed)
Last A1c 6.9.  Too early to recheck today.  Doing well metformin 1000 mg twice daily.  Home sugars typically in the 130 range.  He will come back in a few months and we can recheck A1c at that time.

## 2022-12-16 NOTE — Telephone Encounter (Signed)
Preoperative assessment faxed to [phone] today 12/16/2022

## 2022-12-16 NOTE — Assessment & Plan Note (Signed)
Stable on Crestor 40 mg daily and Zetia 5 mg daily.  Will check lipids next labs.

## 2022-12-20 ENCOUNTER — Ambulatory Visit
Admission: RE | Admit: 2022-12-20 | Discharge: 2022-12-20 | Disposition: A | Discharge status: Home / Self Care | Payer: Medicare Other | Source: Ambulatory Visit | Attending: Neurology | Admitting: Neurology

## 2022-12-20 DIAGNOSIS — G43711 Chronic migraine without aura, intractable, with status migrainosus: Secondary | ICD-10-CM | POA: Diagnosis not present

## 2022-12-20 MED ORDER — GADOPICLENOL 0.5 MMOL/ML IV SOLN
10.0000 mL | Freq: Once | INTRAVENOUS | Status: AC | PRN
  Administered 2022-12-20: 10 mL via INTRAVENOUS

## 2022-12-22 ENCOUNTER — Ambulatory Visit: Payer: Medicare Other | Attending: Neurology | Admitting: Physical Therapy

## 2022-12-22 DIAGNOSIS — R2681 Unsteadiness on feet: Secondary | ICD-10-CM | POA: Diagnosis present

## 2022-12-22 DIAGNOSIS — R42 Dizziness and giddiness: Secondary | ICD-10-CM | POA: Diagnosis present

## 2022-12-22 NOTE — Therapy (Signed)
OUTPATIENT PHYSICAL THERAPY VESTIBULAR TREATMENT NOTE     Patient Name: Robert [name] MRN: [id number] DOB:1946/05/31, 77 y.o., male Today's Date: 12/22/2022  END OF SESSION:  PT End of Session - 12/22/22 0752     Visit Number 4    Number of Visits 12    Date for PT Re-Evaluation 01/13/23    Authorization Type [employer] Medicare    PT Start Time 0805    PT Stop Time 0848    PT Time Calculation (min) 43 min    Activity Tolerance Patient tolerated treatment well    Behavior During Therapy [facility] for tasks assessed/performed              Past Medical History:  Diagnosis Date   Arthritis    CAD (coronary artery disease) 10/10/2011   NSTEMI with DES to the proximal RCA following flow wire evaluation. EF is normal.    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   CORONARY ANGIOPLASTY WITH STENT PLACEMENT  10/13/2011   DES to the proximal RCA. Dr Martinique   FRACTIONAL FLOW RESERVE WIRE Right 10/13/2011   Procedure: [place];  Surgeon: Peter M Martinique, MD;  Location: [facility] CATH LAB;  Service: Cardiovascular;  Laterality: Right;   HERNIA REPAIR     INGUINAL HERNIA REPAIR  10/23/2012   Procedure: HERNIA REPAIR INGUINAL ADULT;  Surgeon: Odis Hollingshead, MD;  Location: [place];  Service: General;  Laterality: Left;  Left lower quadrant; left inguinal hernia repair with mesh   INSERTION OF MESH  10/23/2012   Procedure:  INSERTION OF MESH;  Surgeon: Odis Hollingshead, MD;  Location: [place];  Service: General;  Laterality: Left;  Left lower quadrant   LEFT HEART CATHETERIZATION WITH CORONARY ANGIOGRAM N/A 10/13/2011   Procedure: LEFT HEART CATHETERIZATION WITH CORONARY ANGIOGRAM;  Surgeon: Peter M Martinique, MD;  Location[facility] CATH LAB;  Service: Cardiovascular;  Laterality: N/A;   PERCUTANEOUS CORONARY STENT INTERVENTION (PCI-S) Right 10/13/2011   Procedure: PERCUTANEOUS CORONARY STENT INTERVENTION (PCI-S);  Surgeon: Peter M Martinique, MD;  Location: [facility] CATH LAB;  Service: Cardiovascular;  Laterality: Right;   SHOULDER ARTHROSCOPY W/ ROTATOR CUFF REPAIR Left 1990's   TOTAL HIP ARTHROPLASTY Left 05/14/2021   Procedure: LEFT TOTAL HIP ARTHROPLASTY ANTERIOR APPROACH;  Surgeon: Mcarthur Rossetti, MD;  Location: [facility];  Service: Orthopedics;  Laterality: Left;   TRIGGER FINGER RELEASE Left    3rd and 4th digits   Patient Active Problem List   Diagnosis Date Noted   Osteoarthritis 12/16/2022   Acute medial meniscus tear of right knee 05/30/2022   Chronic migraine without aura, with intractable migraine, so stated, with status migrainosus 09/20/2021   Status post total replacement of left hip 05/14/2021   Type 2 diabetes mellitus with hyperglycemia ([place]) 05/23/2018   Chronically dry eyes, bilateral  05/23/2018   Migraines 11/28/2016   Hypertension associated with diabetes ([place]) 10/11/2011   History of basal cell carcinoma (BCC), right ear 03/03/2009   Hyperlipidemia associated with type 2 diabetes mellitus ([place]) 01/11/2008   Allergic rhinitis 01/07/2008   Prostatitis, recurrent 01/07/2008   History of gastric ulcer 01/07/2008   History of colon polyps, followed by Dr. Earlean Shawl 01/07/2008   GERD 04/02/2007   Sleep apnea 04/02/2007    PCP: Dimas Chyle, MD REFERRING PROVIDER: Suzzanne Cloud, NP   REFERRING DIAG: [phone] (ICD-10-CM) - Chronic migraine without aura, with intractable migraine, so stated, with status  migrainosus  THERAPY DIAG:  Dizziness and giddiness  Unsteadiness on feet  ONSET DATE: 11/30/2021 (MD referral)  Rationale for Evaluation and Treatment: Rehabilitation  SUBJECTIVE:   SUBJECTIVE STATEMENT: Had a migraine end of last week that lasted three days (it always). Vertigo is still there, but it is better than it was.  Would rate symptoms as 4-5/10.  Lasts less than a minute.  Pt accompanied by: self  PERTINENT HISTORY: Hx of migraines, on medications; received B[facility] shot at [facility] for migraines 12/06/2022  PAIN:  Are you having pain? Yes: NPRS scale: 0/10 Pain location: headache Pain description: migraine Aggravating factors: unsure Relieving factors: new medication, Nurtec helps  PRECAUTIONS: Fall and Other: hx of migraines, received Botox 12/06/2022  FALLS: Has patient fallen in last 6 months? No  LIVING ENVIRONMENT: Lives with: lives with their spouse Lives in: House/apartment   PLOF: Independent.  Enjoyed playing golf in the past; is scheduled for knee replacement in April  PATIENT GOALS: Would like to improve my symptoms  OBJECTIVE:    TODAY'S TREATMENT: 12/22/2022 Activity Comments  Standing balance exercises:   Eyes open/eyes closed 30 sec with feet apart, feet together Mild unsteadiness  R Brandt-Daroff x 2 No symptoms, no symptoms; funny feeling in head upon return to sit 1st trial; 2nd trial:  7/10 symptoms nystagmux noted  Semont maneuver performed   R Brandt-Daroff 15 sec dizziness, nystagmus  Semont maneuver  Improved upon sitting 2nd rep; feels symptoms come on after 15 seconds in each position  Pt demo how he gets in bed:  sit>supine with pillows at left, with quick roll to R Definite slowed upbeating R nystagmus, lasts about 30 seconds  Semont performed 3rd rep, with symptoms in L sidelying and return to sit Reports it feels better    Access Code: [email or web address] URL: [email or web address] Date: 12/22/2022 Prepared by: [facility] - Outpatient   Rehab - [facility]  Exercises - Brandt-Daroff Vestibular Exercise  - 1-2 x daily - 7 x weekly - 1 sets - 3 reps - 30 sec hold (*to perform to L and R sides)    PATIENT EDUCATION: Education details: HEP-Brandt-Daroff to R and L sides Person educated: Patient Education method: Explanation, Demonstration, Verbal cues, and Handouts Education comprehension: verbalized understanding, returned demonstration, verbal cues required, and needs further education    -------------------------------------------------------------------------------- Objective measures below taken at initial evaluation:  DIAGNOSTIC FINDINGS: scheduled for head MRI in February  COGNITION: Overall cognitive status: Within functional limits for tasks assessed   POSTURE:  No Significant postural limitations  Cervical ROM:  slight limitations with cervical extension, rotation-pain due to arthritis   TRANSFERS: Assistive device utilized: None  Sit to stand: Modified independence Stand to sit: Modified independence  PATIENT SURVEYS:  FOTO Intake score:  39, predicted 59  VESTIBULAR ASSESSMENT:  GENERAL OBSERVATION: No acute distress   SYMPTOM BEHAVIOR:  Subjective history: Symptoms started about  2 weeks ago, just walking down the sidewalk.  Things started being off balance when I stopped walking.  Have had vertigo before in the past.  Last episode was 6 months ago.  They typically go away on their own. Has hx of migraines-unsure of triggers.  Non-Vestibular symptoms: migraine symptoms  Type of dizziness: Imbalance (Disequilibrium), Spinning/Vertigo, and Unsteady with head/body turns  Frequency: several times per day  Duration: minutes  Aggravating factors: Induced by position change: rolling to the right and Induced by motion: looking up at the ceiling, bending down to the ground, turning body quickly, and turning head quickly  Relieving factors: slow movements and sititng down  Progression of  symptoms: better  OCULOMOTOR EXAM:  Ocular Alignment: normal and pt reports blurriness in R eye following cataract surgery  Ocular ROM: No Limitations  Spontaneous Nystagmus: absent  Gaze-Induced Nystagmus: absent  Smooth Pursuits: intact  Saccades: intact  Convergence/Divergence: 6 cm    VESTIBULAR - OCULAR REFLEX:   Slow VOR: Normal and Comment: R eye slightly slowed movement  VOR Cancellation: Comment: normal, but performs at slower pace  Head-Impulse Test: HIT Right: positive     POSITIONAL TESTING: Right Dix-Hallpike: upbeating, right nystagmus and Duration: 15 sec Left Dix-Hallpike: no nystagmus and wooziness upon sit Right Roll Test: no nystagmus and c/o "waviness"; c/o dizziness upon return to sit Left Roll Test: no nystagmus   M-CTSIB  Condition 1: Firm Surface, EO 30 Sec, Mild Sway  Condition 2: Firm Surface, EC 11.97 Sec, Moderate and Severe Sway  Condition 3: Foam Surface, EO 30 Sec, Mild Sway  Condition 4: Foam Surface, EC 17.62 Sec, Moderate and Severe Sway     VESTIBULAR TREATMENT:                                                                                                   DATE: 12/07/2022  Canalith Repositioning:  Epley Right: Number of Reps: 1, Response to Treatment: comment: dizziness upon returning to sit, and Comment: Difficulty with adequate neck extension during Epley.  After maneuver, pt does report pain in neck during the movement patterns.  PATIENT EDUCATION: Education details: PT eval results, POC Person educated: Patient Education method: Explanation and Demonstration Education comprehension: verbalized understanding  HOME EXERCISE PROGRAM:  GOALS: Goals reviewed with patient? Yes  SHORT TERM GOALS: Target date: 12/30/2022  Pt will be independent with HEP for improved dizziness. Baseline: Goal status: IN PROGRESS  2.  Pt will perform positional testing maneuvers with no reports of dizziness, to demonstrate improved BPPV  symptoms. Baseline:  Goal status: IN PROGRESS  3.  Pt will perform full 30 seconds with moderate sway or less on MCTSIB conditions 2 and 4, for improved vestibular system use for balance. Baseline: unable to hold 30 seconds Goal status: IN PROGRESS  LONG TERM GOALS: Target date: 01/13/2023  Pt will be independent with HEP for improved dizziness. Baseline:  Goal status: IN PROGRESS  2.  FOTO score to improve to at least 59 to demo improved return to functional tasks without dizziness. Baseline:  Goal status: IN PROGRESS  3.  Pt will report 0/10 dizziness with bed mobility and bending/looking up to ceiling tasks. Baseline:  Goal status: IN PROGRESS  ASSESSMENT:  CLINICAL IMPRESSION: Reassessed sidelying test today with nystagmus noted, and performed Semont maneuver again (x 3 today).  Pt has symptoms, nystagmus noted, but overall reports milder than when he typically gets in bed (sit>supine with pillow at L shoulder, then he rolls immediately to the R, with demo today brings on slow, definite nystagmus and symptoms).  Pt is continuing to feel slightly better, but BPPV R posterior canal is still present.  He will continue to benefit from skilled PT to address positional vertigo and continue to progress to balance activities as able.      OBJECTIVE IMPAIRMENTS: Abnormal gait, decreased balance, and dizziness.   ACTIVITY LIMITATIONS: bending, squatting, bed mobility, reach over head, and locomotion level  PARTICIPATION LIMITATIONS: community activity  PERSONAL FACTORS: 3+ comorbidities: See above PMH  are also affecting patient's functional outcome.   REHAB POTENTIAL: Good  CLINICAL DECISION MAKING: Evolving/moderate complexity  EVALUATION COMPLEXITY: Moderate   PLAN:  PT FREQUENCY: 2x/week  PT DURATION: 4 weeks, then 1x/wk for 2 weeks  PLANNED INTERVENTIONS: Therapeutic exercises, Therapeutic activity, Neuromuscular re-education, Balance training, Gait training,  Patient/Family education, Self Care, Vestibular training, Canalith repositioning, and Manual therapy  PLAN FOR NEXT SESSION: Re-assess positional vertigo and treat as needed; review or ask how Brandt-Daroff went at home, habituation, balance, VOR as needed.   Frazier Butt., PT 12/22/2022, 9:03 AM   [facility] at [address] [place], [place], [place] [address] Phone # [phone] Fax # [phone]

## 2022-12-27 ENCOUNTER — Ambulatory Visit: Payer: Medicare Other | Admitting: Physical Therapy

## 2023-01-04 ENCOUNTER — Telehealth: Payer: Self-pay | Admitting: Neurology

## 2023-01-04 NOTE — Telephone Encounter (Signed)
Pt called stating that his insurance company sent him a letter informing him that they will no longer cover his Fremanezumab-vfrm (AJOVY) 225 MG/1.5ML SOAJ  Pt would like to know if an appeal can be made and if not a medication change. Please call pt to update on decision.

## 2023-01-05 ENCOUNTER — Other Ambulatory Visit (HOSPITAL_COMMUNITY): Payer: Self-pay

## 2023-01-05 NOTE — Telephone Encounter (Signed)
Patient Advocate Encounter   Received notification that prior authorization for AJOVY (fremanezumab-vfrm) injection 225MG/1.5ML auto-injectors is required.   PA submitted on 01/05/2023 Key [id number] Status is pending       Lyndel Safe, [place] Patient Advocate Specialist [place] Patient Advocate Team Direct Number: [phone]  Fax: [phone]

## 2023-01-06 NOTE — Telephone Encounter (Signed)
Patient Advocate Encounter  Prior Authorization for AJOVY (fremanezumab-vfrm) injection '225MG'$ /1.5ML auto-injectors has been approved.    PA# [id number] Effective dates: 01/05/2023 through 11/14/2023      Lyndel Safe, [place] Patient Advocate Specialist [place] Patient Advocate Team Direct Number: [phone]  Fax: [phone]

## 2023-01-17 ENCOUNTER — Telehealth: Payer: Self-pay | Admitting: Neurology

## 2023-01-17 ENCOUNTER — Other Ambulatory Visit: Payer: Self-pay | Admitting: *Deleted

## 2023-01-17 MED ORDER — AJOVY 225 MG/1.5ML ~~LOC~~ SOAJ: 225.0000 mg | SUBCUTANEOUS | 3 refills | Status: DC

## 2023-01-17 NOTE — Telephone Encounter (Signed)
Called pt to confirm that he understood he was good to go on getting his Ajovy at the pharmacy. Pt states that he was told by the pharmacy that it would be ready for him yesterday at 2pm. When he went to pick it up they did not have it ready and informed him that he should be able to get it today 01/17/23. Pt verbalized appreciation for all of the help we gave him.

## 2023-01-17 NOTE — Progress Notes (Signed)
Received fax from [facility] mail order asking for Ajovy prescription. Per SS NP, 3 month supply with refills sent to pharmacy.

## 2023-01-17 NOTE — Telephone Encounter (Signed)
Pt wife called stated [facility] is out of stock of  AJOVY (fremanezumab-vfrm) injection '225MG'$ /1.5ML auto-injectors. She is asking if pt can get a sample until they get some in stock.

## 2023-01-18 ENCOUNTER — Other Ambulatory Visit: Payer: Self-pay

## 2023-01-18 MED ORDER — AJOVY 225 MG/1.5ML ~~LOC~~ SOAJ: 225.0000 mg | SUBCUTANEOUS | 3 refills | Status: DC

## 2023-01-18 NOTE — Telephone Encounter (Signed)
Sent the prescription to [facility] on the patients chart after calling and confirming they have the medication in stock. [facility] and [facility] were both [facility] of [facility]. I called and relayed this information to the Patient and wife and both were very grateful.

## 2023-01-19 NOTE — Telephone Encounter (Signed)
FYI wife wanted it noted that they received the Ajovy, no call back requested

## 2023-01-24 ENCOUNTER — Encounter: Payer: Self-pay | Admitting: Physical Therapy

## 2023-01-24 NOTE — H&P (Cosign Needed Addendum)
TOTAL KNEE ADMISSION H&P  Patient is being admitted for right total knee arthroplasty.  Subjective:  Chief Complaint: Right knee pain.  HPI: Robert [name], 77 y.o. male has a history of pain and functional disability in the right knee due to arthritis and has failed non-surgical conservative treatments for greater than 12 weeks to include NSAID's and/or analgesics, corticosteriod injections, viscosupplementation injections, and activity modification. Onset of symptoms was gradual, starting several years ago with gradually worsening course since that time. The patient noted prior procedures on the knee to include  arthroscopy and menisectomy on the right knee.  Patient currently rates pain in the right knee at 7 out of 10 with activity. Patient has night pain, worsening of pain with activity and weight bearing, and pain that interferes with activities of daily living. Patient has evidence of  significant medial joint space narrowing on the right  by imaging studies. There is no active infection.  Patient Active Problem List   Diagnosis Date Noted   Osteoarthritis 12/16/2022   Acute medial meniscus tear of right knee 05/30/2022   Chronic migraine without aura, with intractable migraine, so stated, with status migrainosus 09/20/2021   Status post total replacement of left hip 05/14/2021   Type 2 diabetes mellitus with hyperglycemia ([place]) 05/23/2018   Chronically dry eyes, bilateral 05/23/2018   Migraines 11/28/2016   Hypertension associated with diabetes ([place]) 10/11/2011   History of basal cell carcinoma ([place]), right ear 03/03/2009   Hyperlipidemia associated with type 2 diabetes mellitus ([place]) 01/11/2008   Allergic rhinitis 01/07/2008   Prostatitis, recurrent 01/07/2008   History of gastric ulcer 01/07/2008   History of colon polyps, followed by Dr. Earlean Shawl 01/07/2008   GERD 04/02/2007   Sleep apnea 04/02/2007    Past Medical History:  Diagnosis Date   Arthritis    CAD (coronary artery  disease) 10/10/2011   NSTEMI with DES to the proximal RCA following flow wire evaluation. EF is normal.    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   CORONARY ANGIOPLASTY WITH STENT PLACEMENT  10/13/2011   DES to the proximal RCA. Dr Martinique   FRACTIONAL FLOW RESERVE WIRE Right 10/13/2011   Procedure: [place];  Surgeon: Peter M Martinique, MD;  Location: [facility] CATH LAB;  Service: Cardiovascular;  Laterality: Right;   HERNIA REPAIR     INGUINAL HERNIA REPAIR  10/23/2012   Procedure: HERNIA REPAIR INGUINAL ADULT;  Surgeon: Odis Hollingshead, MD;  Location: [facility];  Service: General;  Laterality: Left;  Left lower quadrant; left inguinal hernia repair with mesh   INSERTION OF MESH  10/23/2012   Procedure: INSERTION OF MESH;  Surgeon: Odis Hollingshead, MD;  Location: [facility];  Service: General;  Laterality: Left;  Left lower quadrant   LEFT HEART CATHETERIZATION WITH CORONARY ANGIOGRAM N/A  10/13/2011   Procedure: LEFT HEART CATHETERIZATION WITH CORONARY ANGIOGRAM;  Surgeon: Peter M Martinique, MD;  Location: [facility] CATH LAB;  Service: Cardiovascular;  Laterality: N/A;   PERCUTANEOUS CORONARY STENT INTERVENTION (PCI-S) Right 10/13/2011   Procedure: PERCUTANEOUS CORONARY STENT INTERVENTION (PCI-S);  Surgeon: Peter M Martinique, MD;  Location: [facility] CATH LAB;  Service: Cardiovascular;  Laterality: Right;   SHOULDER ARTHROSCOPY W/ ROTATOR CUFF  REPAIR Left 1990's   TOTAL HIP ARTHROPLASTY Left 05/14/2021   Procedure: LEFT TOTAL HIP ARTHROPLASTY ANTERIOR APPROACH;  Surgeon: Mcarthur Rossetti, MD;  Location: [facility] ORS;  Service: Orthopedics;  Laterality: Left;   TRIGGER FINGER RELEASE Left    3rd and 4th digits    Prior to Admission medications   Medication Sig Start Date End Date Taking? Authorizing Provider  acetaminophen (TYLENOL) 500 MG tablet Take 1,000 mg by mouth every 6 (six) hours as needed for moderate pain.    [provider]  Ascorbic Acid (VITAMIN C) 1000 MG tablet Take 1,000 mg by mouth daily.    [provider]  ASPIRIN 81 PO Take 81 mg by mouth daily.    [provider]  azelastine (ASTELIN) 0.1 % nasal spray Place 1 spray into both nostrils daily as needed for rhinitis.    [provider]  B Complex-C (SUPER B COMPLEX PO) Take 1 tablet by mouth daily.    [provider]  brimonidine (ALPHAGAN) 0.2 % ophthalmic solution Place 1 drop into the right eye in the morning and at bedtime.    [provider]  dicyclomine (BENTYL) 10 MG capsule TAKE 1 CAPSULE (10 MG TOTAL) BY MOUTH 4 (FOUR) TIMES DAILY   BEFORE MEALS AND AT BEDTIME.    [provider]  ezetimibe (ZETIA) 10 MG tablet Take 0.5 tablets by mouth daily. 01/11/22   [provider]  fluticasone (FLONASE) 50 MCG/ACT nasal spray Place 1 spray into both nostrils daily as needed for allergies.    [provider]  Fremanezumab-vfrm (AJOVY) 225 MG/1.5ML SOAJ Inject 225 mg into the skin every 30 (thirty) days. 01/18/23   Suzzanne Cloud, NP  latanoprost (XALATAN) 0.005 % ophthalmic solution Place 1 drop into the right eye at bedtime.    [provider]  metFORMIN (GLUCOPHAGE) 500 MG tablet TAKE 1 TABLET BY MOUTH EVERY DAY WITH BREAKFAST 10/24/22   Vivi Barrack, MD  metoprolol tartrate (LOPRESSOR) 25 MG tablet Take 12.5 mg by mouth daily.    [provider]  Multiple  Vitamins-Minerals (MULTIVITAMINS THER. W/MINERALS) TABS Take 1 tablet by mouth daily.    [provider]  nitroGLYCERIN (NITROSTAT) 0.4 MG SL tablet Place 1 tablet (0.4 mg total) under the tongue every 5 (five) minutes as needed for chest pain. Do not exceed more than 3 doses per day. 05/20/21 12/16/22  Martinique, Peter M, MD  pantoprazole (PROTONIX) 40 MG tablet Take 40 mg by mouth daily. 01/28/21   [provider]  prednisoLONE acetate (PRED FORTE) 1 % ophthalmic suspension     [provider]  Rimegepant Sulfate (NURTEC) 75 MG TBDP Take 75 mg by mouth daily as needed. For migraines. Take as close to onset of migraine as possible. One daily maximum. 11/30/22   Suzzanne Cloud, NP  rosuvastatin (CRESTOR) 40 MG tablet Take 40 mg by mouth daily. 01/28/21   [provider]  tamsulosin (FLOMAX) 0.4 MG CAPS capsule TAKE 1 CAPSULE BY MOUTH EVERY DAY 03/07/22   Inda Coke, PA  traMADol (ULTRAM) 50 MG tablet  TAKE TWO TABLETS BY MOUTH FOUR TIMES A DAY AS NEEDED FOR RELIEF OF MIGRAINE 01/11/22   [provider]  vitamin E 180 MG (400 UNITS) capsule Take 400 Units by mouth daily.    [provider]  zinc gluconate 50 MG tablet Take 50 mg by mouth daily.    [provider]    Allergies  Allergen Reactions   Sumatriptan Shortness Of Breath    REACTION: sob, headache scalp sensitivity   Penicillins Itching   Brinzolamide-Brimonidine     Other reaction(s): Itching of eye   Lipitor [Atorvastatin] Nausea And Vomiting   Oxycodone Itching    Social History   Socioeconomic History   Marital status: Married    Spouse name: Not on file   Number of children: Not on file   Years of education: Not on file   Highest education level: 12th grade  Occupational History    Comment: Retired -[employer]  Tobacco Use   Smoking status: Former    Packs/day: 2.00    Years: 20.00    Total pack years: 40.00    Types: Cigarettes    Quit date: 11/14/1982    Years since  quitting: 40.2   Smokeless tobacco: Never  Vaping Use   Vaping Use: Never used  Substance and Sexual Activity   Alcohol use: Not Currently    Comment: occ   Drug use: No   Sexual activity: Not on file  Other Topics Concern   Not on file  Social History Narrative   Pt lives with his wife. 1 and a half story home  No issues with stairs. 12th grade education   Social Determinants of Health   Financial Resource Strain: Low Risk  (09/15/2022)   Overall Financial Resource Strain (CARDIA)    Difficulty of Paying Living Expenses: Not hard at all  Food Insecurity: No Food Insecurity (09/15/2022)   Hunger Vital Sign    Worried About Running Out of Food in the Last Year: Never true    Ran Out of Food in the Last Year: Never true  Transportation Needs: No Transportation Needs (09/15/2022)   PRAPARE - Hydrologist (Medical): No    Lack of Transportation (Non-Medical): No  Physical Activity: Inactive (09/15/2022)   Exercise Vital Sign    Days of Exercise per Week: 0 days    Minutes of Exercise per Session: 0 min  Stress: No Stress Concern Present (09/15/2022)   [place]    Feeling of Stress : Not at all  Social Connections: Moderately Integrated (09/15/2022)   Social Connection and Isolation Panel [NHANES]    Frequency of Communication with Friends and Family: Three times a week    Frequency of Social Gatherings with Friends and Family: More than three times a week    Attends Religious Services: Never    Marine scientist or Organizations: Yes    Attends Archivist Meetings: 1 to 4 times per year    Marital Status: Married  Human resources officer Violence: Not At Risk (09/15/2022)   Humiliation, Afraid, Rape, and Kick questionnaire    Fear of Current or Ex-Partner: No    Emotionally Abused: No    Physically Abused: No    Sexually Abused: No    Tobacco Use: Medium Risk (12/16/2022)    Patient History    Smoking Tobacco Use: Former    Smokeless Tobacco Use: Never    Passive Exposure: Not on  file   Social History   Substance and Sexual Activity  Alcohol Use Not Currently   Comment: occ    Family History  Problem Relation Age of Onset   Cancer Mother        lung   Heart disease Father        heart attack    Cancer Father        colon   Migraines Neg Hx    Headache Neg Hx     Review of Systems  Constitutional:  Negative for chills and fever.  HENT: Negative.    Eyes: Negative.   Respiratory:  Negative for cough and shortness of breath.   Cardiovascular:  Negative for chest pain and palpitations.  Gastrointestinal:  Negative for abdominal pain, constipation, diarrhea, nausea and vomiting.  Genitourinary:  Negative for dysuria, frequency and urgency.  Musculoskeletal:  Positive for joint pain.  Skin:  Negative for rash.   Objective:  Physical Exam: Well nourished and well developed.  General: Alert and oriented x3, cooperative and pleasant, no acute distress.  Head: normocephalic, atraumatic, neck supple.  Eyes: EOMI. Abdomen: non-tender to palpation and soft, normoactive bowel sounds. Musculoskeletal:  Right Knee Exam: No effusion present. No swelling present. The range of motion is: 0 to 130 degrees. No crepitus on range of motion of the knee. Significant medial joint line tenderness. No lateral joint line tenderness. The knee is stable.  Calves soft and nontender. Motor function intact in LE. Strength 5/5 LE bilaterally. Neuro: Distal pulses 2+. Sensation to light touch intact in LE.  Vital signs in last 24 hours: BP: ()/()  Arterial Line BP: ()/()   Imaging Review Plain radiographs demonstrate moderate degenerative joint disease of the right knee. The overall alignment is neutral. The bone quality appears to be adequate for age and reported activity level.  Assessment/Plan:  End stage arthritis, right knee   The patient history,  physical examination, clinical judgment of the provider and imaging studies are consistent with end stage degenerative joint disease of the right knee and total knee arthroplasty is deemed medically necessary. The treatment options including medical management, injection therapy arthroscopy and arthroplasty were discussed at length. The risks and benefits of total knee arthroplasty were presented and reviewed. The risks due to aseptic loosening, infection, stiffness, patella tracking problems, thromboembolic complications and other imponderables were discussed. The patient acknowledged the explanation, agreed to proceed with the plan and consent was signed. Patient is being admitted for inpatient treatment for surgery, pain control, PT, OT, prophylactic antibiotics, VTE prophylaxis, progressive ambulation and ADLs and discharge planning. The patient is planning to be discharged  home .  Patient's anticipated LOS is less than 2 midnights, meeting these requirements: - Lives within 1 hour of care - Has a competent adult at home to recover with post-op  - NO history of  - Coronary Artery Disease  - Heart failure  - Heart attack  - Stroke  - DVT/VTE  - Cardiac arrhythmia  - Respiratory Failure/COPD  - Renal failure  - Anemia  - Advanced Liver disease  Therapy Plans: [facility] Disposition: Home with Wife Planned DVT Prophylaxis: Aspirin 81 mg BID (hx of stent) DME Needed: None PCP: Dimas Chyle, MD (clearance received) Cardiologist: Peter Martinique, MD (contacting for clearance) TXA: IV Allergies: penicillins (itching), oxycodone (itching) Anesthesia Concerns: uses CPAP BMI: 26.8 Last HgbA1c: 6.9%  [facility]  Other: -Cardiac stent placed about 10 years ago -Currently  takes Tramadol TID  - Patient was instructed on what medications to stop prior to surgery. - Follow-up visit in 2 weeks with Dr. Wynelle Link - Begin  physical therapy following surgery - Pre-operative lab work as pre-surgical testing - Prescriptions will be provided in hospital at time of discharge  R. Jaynie Bream, PA-C Orthopedic Surgery [facility] [place]

## 2023-01-24 NOTE — Therapy (Signed)
[facility] [address], [place], [place], [address] Phone: [phone]   Fax:  [phone]  Patient Details  Name: Robert [name] MRN: [id number] Date of Birth: 08/20/1946 Referring Provider:  No ref. provider found  Encounter Date: 01/24/2023  PHYSICAL THERAPY DISCHARGE SUMMARY  Visits from Start of Care: 4  Current functional level related to goals / functional outcomes: Not fully able to address, as pt did not return after 4th visit.  See eval/ tx notes for details   Remaining deficits: Positional vertigo (improving, but still present at pt's last visit)   Education / Equipment: HEP, rationale for vestibular treatment   Patient agrees to discharge. Patient goals were not met. Patient is being discharged due to not returning since the last visit.  Fahima Cifelli W., PT 01/24/2023, 9:56 AM  [facility] [address] [place], [place], [address] Phone: [phone]   Fax:  [phone]

## 2023-01-30 ENCOUNTER — Telehealth: Payer: Self-pay | Admitting: Family Medicine

## 2023-01-30 ENCOUNTER — Ambulatory Visit (INDEPENDENT_AMBULATORY_CARE_PROVIDER_SITE_OTHER): Payer: Medicare Other | Admitting: Family Medicine

## 2023-01-30 ENCOUNTER — Encounter: Payer: Self-pay | Admitting: Family Medicine

## 2023-01-30 VITALS — BP 127/62 | HR 56 | Temp 98.0°F | Ht 73.0 in | Wt 207.2 lb

## 2023-01-30 DIAGNOSIS — E1165 Type 2 diabetes mellitus with hyperglycemia: Secondary | ICD-10-CM

## 2023-01-30 DIAGNOSIS — R5381 Other malaise: Secondary | ICD-10-CM | POA: Diagnosis not present

## 2023-01-30 DIAGNOSIS — N419 Inflammatory disease of prostate, unspecified: Secondary | ICD-10-CM | POA: Diagnosis not present

## 2023-01-30 NOTE — Progress Notes (Signed)
   Robert [name] is a 77 y.o. male who presents today for an office visit.  Assessment/Plan:  New/Acute Problems: Malaise Brought differential.  Symptoms are relatively vague though he is concerned about recurrence of prostatitis that resulted in him getting sepsis a year and a half ago.  We will check labs today to further evaluate including CBC, c-Met, TSH, A1c, sed rate, B12, and vitamin D.  If all his labs are negative would consider empiric treatment for prostatitis given his history.  We did discuss reasons to return to care and seek emergent care.  Currently vital signs are stable has a reassuring exam-do not need to do any emergent workup at this point.  Chronic Problems Addressed Today: Prostatitis, recurrent Recently saw urology for this.  No recent flareups though he is concerned his current malaise may be due to a precursor of his UTI which resulted in hospitalization a year and a half ago.  May consider empiric treatment for prostatitis if symptoms or not improving above workup is negative.  Type 2 diabetes mellitus with hyperglycemia ([name]) Recheck A1c today with labs.  Continue metformin 1000 mg twice daily.  Could be contributing to his fatigue if A1c is not controlled.     Subjective:  HPI:  See A/p for status of chronic conditions.   His main concern today is malaise.  This has been going on for a couple of weeks.  He has a history of sepsis about a year and a half ago related to an episode of prostatitis and he is concerned about recurrence.  His current symptoms started a couple of weeks ago.  He has been having ongoing issues with balanitis and has followed with his urologist at the [facility] for this.  They have tried a few topical creams and symptoms do seem to be improving with the rash however still has ongoing malaise and symptoms of not feeling well.  He would like to have blood work today to make sure he is not developing any infection.  No testicular pain no rectal pain.  No  scrotal pain.  No fevers or chills.  No difficulty with urination.        Objective:  Physical Exam: BP 127/62   Pulse (!) 56   Temp 98 F (36.7 C) (Temporal)   Ht 6\' 1"  (1.854 m)   Wt 207 lb 3.2 oz (94 kg)   SpO2 97%   BMI 27.34 kg/m   Gen: No acute distress, resting comfortably CV: Regular rate and rhythm with no murmurs appreciated Pulm: Normal work of breathing, clear to auscultation bilaterally with no crackles, wheezes, or rhonchi Neuro: Grossly normal, moves all extremities Psych: Normal affect and thought content      Robert [name] M. Jerline Pain, MD 01/30/2023 3:04 PM

## 2023-01-30 NOTE — Telephone Encounter (Signed)
Patient states he is concerned Sepsis is coming back in groin area.  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  States his Blood pressure has been running approx.121/47  State overall not feeling well.  Transferred to Triage Santiago Glad)

## 2023-01-30 NOTE — Telephone Encounter (Signed)
Unable to Reach Patient   Patient Name: Robert [name] Gender: Male DOB: May 22, 1946 Age: 77 Y 1 M 25 D Return Phone Number: [phone] (Primary) Address: City/ State/ Zip: [place]  [address] Client [facility] Client Site [facility] Provider Dimas Chyle- MD Contact Type Call Who Is Calling Patient / Member / Family / Caregiver Call Type Triage / Clinical Relationship To Patient Self Return Phone Number [phone] (Primary) Chief Complaint Blood Pressure Low Reason for Call Symptomatic / Request for [place] states that he has a fungi infection in his private area, doesn't feel well in general, has a blood pressure of 121/47. He fears that his sepsis is back. He would like to come in for a blood test today. Translation No Disp. Time Eilene Ghazi Time) Disposition Final User 01/30/2023 8:53:05 AM Attempt made - message left Earleen Reaper 01/30/2023 9:10:35 AM Attempt made - message left Earleen Reaper 01/30/2023 9:22:13 AM FINAL ATTEMPT MADE - no message left Yes Ysidro Evert RN, Levada Dy Final Disposition 01/30/2023 9:22:13 AM FINAL ATTEMPT MADE - no message left Yes Ysidro Evert, RN, Levada Dy

## 2023-01-30 NOTE — Patient Instructions (Signed)
It was very nice to see you today!  We will check blood work today.  Please let us know if you have any change in symptoms.  Take care, Dr Jerline Pain  PLEASE NOTE:  If you had any lab tests, please let us know if you have not heard back within a few days. You may see your results on mychart before we have a chance to review them but we will give you a call once they are reviewed by [place].   If we ordered any referrals today, please let us know if you have not heard from their office within the next week.   If you had any urgent prescriptions sent in today, please check with the pharmacy within an hour of our visit to make sure the prescription was transmitted appropriately.   Please try these tips to maintain a healthy lifestyle:  Eat at least 3 REAL meals and 1-2 snacks per day.  Aim for no more than 5 hours between eating.  If you eat breakfast, please do so within one hour of getting up.   Each meal should contain half fruits/vegetables, one quarter protein, and one quarter carbs (no bigger than a computer mouse)  Cut down on sweet beverages. This includes juice, soda, and sweet tea.   Drink at least 1 glass of water with each meal and aim for at least 8 glasses per day  Exercise at least 150 minutes every week.

## 2023-01-30 NOTE — Assessment & Plan Note (Signed)
Recheck A1c today with labs.  Continue metformin 1000 mg twice daily.  Could be contributing to his fatigue if A1c is not controlled.

## 2023-01-30 NOTE — Telephone Encounter (Signed)
See note

## 2023-01-30 NOTE — Assessment & Plan Note (Signed)
Recently saw urology for this.  No recent flareups though he is concerned his current malaise may be due to a precursor of his UTI which resulted in hospitalization a year and a half ago.  May consider empiric treatment for prostatitis if symptoms or not improving above workup is negative.

## 2023-01-31 LAB — COMPREHENSIVE METABOLIC PANEL
ALT: 16 U/L (ref 0–53)
AST: 19 U/L (ref 0–37)
Albumin: 4.1 g/dL (ref 3.5–5.2)
Alkaline Phosphatase: 49 U/L (ref 39–117)
BUN: 15 mg/dL (ref 6–23)
CO2: 28 mEq/L (ref 19–32)
Calcium: 9.5 mg/dL (ref 8.4–10.5)
Chloride: 104 mEq/L (ref 96–112)
Creatinine, Ser: 0.78 mg/dL (ref 0.40–1.50)
GFR: 85.03 mL/min (ref >60.00)
Glucose, Bld: 148 mg/dL — ABNORMAL HIGH (ref 70–99)
Potassium: 4.5 mEq/L (ref 3.5–5.1)
Sodium: 140 mEq/L (ref 135–145)
Total Bilirubin: 0.3 mg/dL (ref 0.2–1.2)
Total Protein: 6.5 g/dL (ref 6.0–8.3)

## 2023-01-31 LAB — CBC
HCT: 42.2 % (ref 39.0–52.0)
Hemoglobin: 14.2 g/dL (ref 13.0–17.0)
MCHC: 33.7 g/dL (ref 30.0–36.0)
MCV: 94.1 fl (ref 78.0–100.0)
Platelets: 177 10*3/uL (ref 150.0–400.0)
RBC: 4.48 Mil/uL (ref 4.22–5.81)
RDW: 13.8 % (ref 11.5–15.5)
WBC: 6.2 10*3/uL (ref 4.0–10.5)

## 2023-01-31 LAB — HEMOGLOBIN A1C: Hgb A1c MFr Bld: 7.6 % — ABNORMAL HIGH (ref 4.6–6.5)

## 2023-01-31 LAB — VITAMIN B12: Vitamin B-12: 561 pg/mL (ref 211–911)

## 2023-01-31 LAB — VITAMIN D 25 HYDROXY (VIT D DEFICIENCY, FRACTURES): VITD: 33.73 ng/mL (ref 30.00–100.00)

## 2023-01-31 LAB — SEDIMENTATION RATE: Sed Rate: 16 mm/hr (ref 0–20)

## 2023-01-31 LAB — TSH: TSH: 1.69 u[IU]/mL (ref 0.35–5.50)

## 2023-02-01 ENCOUNTER — Other Ambulatory Visit: Payer: Self-pay | Admitting: *Deleted

## 2023-02-01 MED ORDER — METFORMIN HCL 1000 MG PO TABS: 1000.0000 mg | ORAL_TABLET | Freq: Two times a day (BID) | ORAL | 1 refills | Status: DC

## 2023-02-01 NOTE — Progress Notes (Signed)
Please inform patient of the following:  His A1c is up a little bit but overall stable.  The rest of his labs are all normal.  No signs of sepsis or other signs of infection or inflammation.  He should let us know if his symptoms are not improving and we can discuss starting a course of antibiotics to treat for any potential prostatitis.  For his A1c he can continue with metformin 1000 mg twice daily and we can recheck his A1c in 3 months.

## 2023-02-03 ENCOUNTER — Telehealth: Payer: Self-pay | Admitting: Family Medicine

## 2023-02-03 NOTE — Telephone Encounter (Signed)
Patient aware to take 1,000 mg twice daily per Dimas Chyle, MD

## 2023-02-03 NOTE — Progress Notes (Addendum)
Anesthesia Review:  [id number] [place] Clearance on chart dated 12/16/22 with LOV of 12/16/22 on chart.  Cardiologist : Chest x-ray : EKG : Echo : Stress test: Cardiac Cath :  Activity level:  Sleep Study/ CPAP : Fasting Blood Sugar :      / Checks Blood Sugar -- times a day:   Blood Thinner/ Instructions /Last Dose: ASA / Instructions/ Last Dose :    81 nmg aspirin     PreDM-  Hgba1c-

## 2023-02-03 NOTE — Telephone Encounter (Signed)
Caller or Patient requests to be called regarding dosage of Metformin HCL

## 2023-02-06 ENCOUNTER — Other Ambulatory Visit: Payer: Self-pay | Admitting: *Deleted

## 2023-02-06 ENCOUNTER — Telehealth: Payer: Self-pay | Admitting: Family Medicine

## 2023-02-06 MED ORDER — CIPROFLOXACIN HCL 500 MG PO TABS: 500.0000 mg | ORAL_TABLET | Freq: Two times a day (BID) | ORAL | 0 refills | Status: DC

## 2023-02-06 NOTE — Telephone Encounter (Signed)
Patient stated not feeling good requesting antibiotic send to his pharmacy, as directed per PCP on his last visit Please advise

## 2023-02-06 NOTE — Patient Instructions (Signed)
SURGICAL WAITING ROOM VISITATION  Patients having surgery or a procedure may have no more than 2 support people in the waiting area - these visitors may rotate.    Children under the age of 103 must have an adult with them who is not the patient.  Due to an increase in RSV and influenza rates and associated hospitalizations, children ages 59 and under may not visit patients in [place].  If the patient needs to stay at the hospital during part of their recovery, the visitor guidelines for inpatient rooms apply. Pre-op nurse will coordinate an appropriate time for 1 support person to accompany patient in pre-op.  This support person may not rotate.    Please refer to the [facility] website for the visitor guidelines for Inpatients (after your surgery is over and you are in a regular room).       Your procedure is scheduled on:  02/21/2023    Report to [facility] Main Entrance    Report to admitting at  130 pm     Call this number if you have problems the morning of surgery [phone]   Do not eat food :After Midnight.   After Midnight you may have the following liquids until __1245____ PM DAY OF SURGERY  Water Non-Citrus Juices (without pulp, NO RED-Apple, White grape, White cranberry) Black Coffee (NO MILK/CREAM OR CREAMERS, sugar ok)  Clear Tea (NO MILK/CREAM OR CREAMERS, sugar ok) regular and decaf                             Plain Jell-O (NO RED)                                           Fruit ices (not with fruit pulp, NO RED)                                     Popsicles (NO RED)                                                               Sports drinks like Gatorade (NO RED)         The day of surgery:  Drink ONE (1) Pre-Surgery Clear Ensure or G2 at   1245 pm  the morning of surgery. Drink in one sitting. Do not sip.  This drink was given to you during your hospital  pre-op appointment visit. Nothing else to drink after completing the   Pre-Surgery Clear Ensure or G2.          If you have questions, please contact your surgeon's office.        Oral Hygiene is also important to reduce your risk of infection.                                    Remember - BRUSH YOUR TEETH THE MORNING OF SURGERY WITH YOUR REGULAR TOOTHPASTE  DENTURES WILL BE REMOVED PRIOR TO SURGERY PLEASE DO  NOT APPLY "Poly grip" OR ADHESIVES!!!   Do NOT smoke after Midnight   Take these medicines the morning of surgery with A SIP OF WATER:  eye drops as usual , flonase if needed, metoprolol, protonix, flomax   DO NOT TAKE ANY ORAL DIABETIC MEDICATIONS DAY OF YOUR SURGERY  Bring CPAP mask and tubing day of surgery.                              You may not have any metal on your body including hair pins, jewelry, and body piercing             Do not wear make-up, lotions, powders, perfumes/cologne, or deodorant  Do not wear nail polish including gel and S&S, artificial/acrylic nails, or any other type of covering on natural nails including finger and toenails. If you have artificial nails, gel coating, etc. that needs to be removed by a nail salon please have this removed prior to surgery or surgery may need to be canceled/ delayed if the surgeon/ anesthesia feels like they are unable to be safely monitored.   Do not shave  48 hours prior to surgery.               Men may shave face and neck.   Do not bring valuables to the hospital. [place].   Contacts, glasses, dentures or bridgework may not be worn into surgery.   Bring small overnight bag day of surgery.   DO NOT [place]. PHARMACY WILL DISPENSE MEDICATIONS LISTED ON YOUR MEDICATION LIST TO YOU DURING YOUR ADMISSION [place]!    Patients discharged on the day of surgery will not be allowed to drive home.  Someone NEEDS to stay with you for the first 24 hours after anesthesia.   Special  Instructions: Bring a copy of your healthcare power of attorney and living will documents the day of surgery if you haven't scanned them before.              Please read over the following fact sheets you were given: IF Robert [name] [phone]   If you received a COVID test during your pre-op visit  it is requested that you wear a mask when out in public, stay away from anyone that may not be feeling well and notify your surgeon if you develop symptoms. If you test positive for Covid or have been in contact with anyone that has tested positive in the last 10 days please notify you surgeon.    [place] - Preparing for Surgery Before surgery, you can play an important role.  Because skin is not sterile, your skin needs to be as free of germs as possible.  You can reduce the number of germs on your skin by washing with CHG (chlorahexidine gluconate) soap before surgery.  CHG is an antiseptic cleaner which kills germs and bonds with the skin to continue killing germs even after washing. Please DO NOT use if you have an allergy to CHG or antibacterial soaps.  If your skin becomes reddened/irritated stop using the CHG and inform your nurse when you arrive at Short Stay. Do not shave (including legs and underarms) for at least 48 hours prior to the first CHG shower.  You may  shave your face/neck. Please follow these instructions carefully:  1.  Shower with CHG Soap daily starting 5 days prior to surgery. .  2.  If you choose to wash your hair, wash your hair first as usual with your  normal  shampoo.  3.  After you shampoo, rinse your hair and body thoroughly to remove the  shampoo.                           4.  Use CHG as you would any other liquid soap.  You can apply chg directly  to the skin and wash                       Gently with a scrungie or clean washcloth.  5.  Apply the CHG Soap to your body ONLY FROM THE NECK DOWN.   Do not use on face/  open                           Wound or open sores. Avoid contact with eyes, ears mouth and genitals (private parts).                       Wash face,  Genitals (private parts) with your normal soap.             6.  Wash thoroughly, paying special attention to the area where your surgery  will be performed.  7.  Thoroughly rinse your body with warm water from the neck down.  8.  DO NOT shower/wash with your normal soap after using and rinsing off  the CHG Soap.                9.  Pat yourself dry with a clean towel.            10.  Wear clean pajamas.            11.  Place clean sheets on your bed the night of your first shower and do not  sleep with pets. Day of Surgery : Do not apply any lotions/deodorants the morning of surgery.  Please wear clean clothes to the hospital/surgery center.  FAILURE TO FOLLOW THESE INSTRUCTIONS MAY RESULT IN THE CANCELLATION OF YOUR SURGERY PATIENT SIGNATURE_________________________________  NURSE SIGNATURE__________________________________  ________________________________________________________________________

## 2023-02-06 NOTE — Telephone Encounter (Signed)
Rx sent to the pharmacy.

## 2023-02-06 NOTE — Telephone Encounter (Signed)
Pt would like a call back aout his last appt. Please advise.

## 2023-02-07 ENCOUNTER — Encounter (HOSPITAL_COMMUNITY)
Admission: RE | Admit: 2023-02-07 | Discharge: 2023-02-07 | Disposition: A | Discharge status: Home / Self Care | Payer: Medicare Other | Source: Ambulatory Visit | Attending: Orthopedic Surgery | Admitting: Orthopedic Surgery

## 2023-02-07 ENCOUNTER — Other Ambulatory Visit: Payer: Self-pay

## 2023-02-07 ENCOUNTER — Telehealth: Payer: Self-pay | Admitting: *Deleted

## 2023-02-07 ENCOUNTER — Encounter (HOSPITAL_COMMUNITY): Payer: Self-pay

## 2023-02-07 VITALS — BP 124/60 | HR 58 | Temp 97.9°F | Resp 16 | Ht 73.0 in | Wt 202.0 lb

## 2023-02-07 DIAGNOSIS — Z01818 Encounter for other preprocedural examination: Secondary | ICD-10-CM | POA: Insufficient documentation

## 2023-02-07 DIAGNOSIS — E1165 Type 2 diabetes mellitus with hyperglycemia: Secondary | ICD-10-CM | POA: Insufficient documentation

## 2023-02-07 HISTORY — DX: Cerebral infarction, unspecified: I63.9

## 2023-02-07 HISTORY — DX: Type 2 diabetes mellitus without complications: E11.9

## 2023-02-07 LAB — CBC
HCT: 43.1 % (ref 39.0–52.0)
Hemoglobin: 14.1 g/dL (ref 13.0–17.0)
MCH: 31.3 pg (ref 26.0–34.0)
MCHC: 32.7 g/dL (ref 30.0–36.0)
MCV: 95.8 fL (ref 80.0–100.0)
Platelets: 145 10*3/uL — ABNORMAL LOW (ref 150–400)
RBC: 4.5 MIL/uL (ref 4.22–5.81)
RDW: 13.4 % (ref 11.5–15.5)
WBC: 7.8 10*3/uL (ref 4.0–10.5)
nRBC: 0 % (ref 0.0–0.2)

## 2023-02-07 LAB — GLUCOSE, CAPILLARY: Glucose-Capillary: 150 mg/dL — ABNORMAL HIGH (ref 70–99)

## 2023-02-07 LAB — BASIC METABOLIC PANEL
Anion gap: 8 (ref 5–15)
BUN: 11 mg/dL (ref 8–23)
CO2: 27 mmol/L (ref 22–32)
Calcium: 9.3 mg/dL (ref 8.9–10.3)
Chloride: 102 mmol/L (ref 98–111)
Creatinine, Ser: 0.84 mg/dL (ref 0.61–1.24)
GFR, Estimated: >60 mL/min (ref >60)
Glucose, Bld: 157 mg/dL — ABNORMAL HIGH (ref 70–99)
Potassium: 4.3 mmol/L (ref 3.5–5.1)
Sodium: 137 mmol/L (ref 135–145)

## 2023-02-07 LAB — SURGICAL PCR SCREEN
MRSA, PCR: NEGATIVE
Staphylococcus aureus: NEGATIVE

## 2023-02-07 LAB — HEMOGLOBIN A1C
Hgb A1c MFr Bld: 7.4 % — ABNORMAL HIGH (ref 4.8–5.6)
Mean Plasma Glucose: 166 mg/dL

## 2023-02-07 NOTE — Telephone Encounter (Signed)
Left message to call back to schedule an appt in the office for pre op clearance. Pt can be seen NL, [facility] or [facility] so we can get the pt seen in time for his procedure.

## 2023-02-07 NOTE — Telephone Encounter (Signed)
Primary Cardiologist:Peter Martinique, MD  Chart reviewed as part of pre-operative protocol coverage. Because of Robert [name]'s past medical history and time since last visit, he/she will require a follow-up visit in order to better assess preoperative cardiovascular risk.  Pre-op covering staff: - Please schedule appointment and call patient to inform them. - Please contact requesting surgeon's office via preferred method (i.e, phone, fax) to inform them of need for appointment prior to surgery.  Request to hold aspirin can be addressed at the time of the visit.   Emmaline Life, NP-C  02/07/2023, 11:06 AM [address] Office [phone] Fax [phone]

## 2023-02-07 NOTE — Telephone Encounter (Signed)
   Pre-operative Risk Assessment    Patient Name: Robert [name]  DOB: 07/17/1946 MRN: [id number]      Request for Surgical Clearance    Procedure:   RIGHT KNEE MEDIAL UNICOMPARTMENTAL ARTHROPLASTY  Date of Surgery:  Clearance 02/20/23                                 Surgeon:  DR. Gaynelle Arabian Surgeon's Group or Practice Name:  [facility] Phone number:  [phone] ATTN: [place] Fax number:  [phone]   Type of Clearance Requested:   - Medical ; ASA   Type of Anesthesia:   CHOICE   Additional requests/questions:    Jiles Prows   02/07/2023, 9:53 AM

## 2023-02-08 NOTE — Telephone Encounter (Signed)
Pt has been scheduled to see Cleotis Nipper, NP, 02/09/23, clearance will be addressed at that time.  Will route to the requesting surgeon's office to make them aware.

## 2023-02-08 NOTE — Progress Notes (Signed)
Cardiology Clinic Note   Date: 02/09/2023 ID: Robert [name] 1945/11/20, MRN [id number]  Primary Cardiologist:  Peter Swaziland, MD  Patient Profile    Robert [name] is a 77 y.o. male who presents to the clinic today for pre-operative cardiac risk assessment.  Past medical history significant for: CAD. LHC 12/11/2001 (abnormal stress test): Minimal irregularities in RCA with no obstructive CAD. LHC 10/13/2011 (NSTEMI): Proximal RCA 70%.  PCI with DES 3.5 x 20 mm to proximal RCA. Nuclear stress test 01/15/2014: Negative for pharmacologic stress-induced ischemia.  EF 57%. Echo 12/16/2021: EF 60 to 65%.  Mild LVH.  Trivial MR.  Aortic valve sclerosis/calcification without stenosis. Hypertension. Hyperlipidemia. Lipid panel 06/15/2022: LDL 75, HDL 59, TG 120, total 157. CVA. 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 MRI brain 12/20/2022: Remote left occipital cortical stroke, unchanged compared to the previous MRI.  Scattered T2/FLAIR hyperintense foci in the subcortical and deep white matter consistent with mild chronic microvascular ischemic change, unchanged compared to the previous MRI.  No acute findings. OSA. Esophageal ulcer. Avascular necrosis. Chronic migraines. T2DM.   History of Present Illness    Robert [name] is a longtime patient of cardiology.  He is followed by Dr. Swaziland for the above outlined history.  Patient was last seen in the office by Joni Reining, NP on 01/14/2022.  He was doing well having recovered from sepsis hospitalization in December 2022.  He reported being back to his normal energy level and participating in normal activities.  No medication changes were made.  Patient is pending right knee medial unicompartmental arthroplasty with Dr. Despina Hick scheduled for 02/20/2023.  Today, patient doing well. Patient denies shortness of breath or dyspnea on exertion. No chest pain, pressure, or tightness. Denies lower extremity edema, orthopnea, or PND.  No palpitations. His activity has been limited secondary to knee pain. He was golfing for exercise but had to stop because the twisting motion of his swing causes him increased knee pain. He has been getting out more now that the weather is warmer. He has been doing yard work and is able to [employer] his lawn with both a push and a Journalist, newspaper. He has no cardiac complaints of concerns today.    ROS: All other systems reviewed and are otherwise negative except as noted in History of Present Illness.  Studies Reviewed    ECG personally reviewed by me today: NSR, 59 bpm.  No significant changes from 02/07/2023.  Risk Assessment/Calculations    Revised Cardiac Risk Index (RCRI)   High Risk Surgery? No  History of Ischemic Heart Disease? Yes  History of Congestive Heart Failure? No  History of Cerebrovascular Disease? Yes  Pre-Op Treatment w/ Insulin? No  Pre-Operative Creatinine > 2 mg/dL? No  Total Points 2  Perioperative Risk of Major Cardiac Event is (%) 6.6       Duke Activity Status Index (DASI)   Can you take care of yourself? Yes  Can you walk indoors? Yes  Can you walk 1-2 blocks on level ground? Yes  Can you climb a flight of stairs or walk up a hill? Yes  Can you run a short distance? No  Can you do light work around the house? Yes  Can you do moderate work around the house? Yes  Can you do heavy work around the house? No  Can you do yardwork? Yes  Are you able to have sexual relations? --  Are you able to participate in moderate recreational activities?  No  Are you able to participate in strenuous sports? No  DASI Score 23.45  Functional Capacity in METs is 5.62        Physical Exam    VS:  BP (!) 126/52   Pulse 60   Wt 208 lb 9.6 oz (94.6 kg)   SpO2 98%   BMI 27.52 kg/m  , BMI Body mass index is 27.52 kg/m.  GEN: Well nourished, well developed, in no acute distress. Neck: No JVD or carotid bruits. Cardiac:  RRR. No murmurs. No rubs or gallops.   Respiratory:   Respirations regular and unlabored. Clear to auscultation without rales, wheezing or rhonchi. GI: Soft, nontender, nondistended. Extremities: Radials/DP/PT 2+ and equal bilaterally. No clubbing or cyanosis. No edema.  Skin: Warm and dry, no rash. Neuro: Strength intact.  Assessment & Plan   CAD.  S/p PCI with DES to proximal RCA November 2012.  Nuclear stress test March 2015 negative.  Patient denies chest pain, pressure or tightness. Continue aspirin, Zetia, rosuvastatin, metoprolol, as needed SL NTG. Hypertension.  BP today 126/52. Patient denies headaches or dizziness.  Continue metoprolol. Hyperlipidemia.  LDL August 2023 75, not at goal.  Continue rosuvastatin and Zetia. Preoperative cardiovascular risk assessment.  Pending right knee medial unicompartmental arthroplasty with Dr. Despina Hick scheduled for 02/20/2023. According to the RCRI, patient has a 6.6% risk of MACE. Patient reports activity equivalent to 5.62 METS (per DASI). Based on ACC/AHA guidelines, Robert [name] would be at acceptable risk for the planned procedure without further cardiovascular testing.  Ideally aspirin should be continued without interruption, however if the bleeding risk is too great, aspirin may be held for 5-7 days prior to surgery. Please resume aspirin post operatively when it is felt to be safe from a bleeding standpoint.     I will route this recommendation to the requesting party via Epic fax function.  Disposition: Return in 1 year or sooner as needed.          Signed, Etta Grandchild. Arhaan Chesnut, DNP, NP-C

## 2023-02-09 ENCOUNTER — Ambulatory Visit: Payer: Medicare Other | Attending: Student | Admitting: Student

## 2023-02-09 ENCOUNTER — Encounter: Payer: Self-pay | Admitting: Student

## 2023-02-09 VITALS — BP 126/52 | HR 60 | Wt 208.6 lb

## 2023-02-09 DIAGNOSIS — I10 Essential (primary) hypertension: Secondary | ICD-10-CM

## 2023-02-09 DIAGNOSIS — Z01810 Encounter for preprocedural cardiovascular examination: Secondary | ICD-10-CM

## 2023-02-09 DIAGNOSIS — E785 Hyperlipidemia, unspecified: Secondary | ICD-10-CM | POA: Diagnosis not present

## 2023-02-09 DIAGNOSIS — I2510 Atherosclerotic heart disease of native coronary artery without angina pectoris: Secondary | ICD-10-CM

## 2023-02-09 NOTE — Patient Instructions (Signed)
Medication Instructions:  Your physician recommends that you continue on your current medications as directed. Please refer to the Current Medication list given to you today.  *If you need a refill on your cardiac medications before your next appointment, please call your pharmacy*   Lab Work: NONE If you have labs (blood work) drawn today and your tests are completely normal, you will receive your results only by: MyChart Message (if you have MyChart) OR A paper copy in the mail If you have any lab test that is abnormal or we need to change your treatment, we will call you to review the results.   Testing/Procedures: NONE   Follow-Up: At [facility], you and your health needs are our priority.  As part of our continuing mission to provide you with exceptional heart care, we have created designated Provider Care Teams.  These Care Teams include your primary Cardiologist (physician) and Advanced Practice Providers (APPs -  Physician Assistants and Nurse Practitioners) who all work together to provide you with the care you need, when you need it.  We recommend signing up for the patient portal called "MyChart".  Sign up information is provided on this After Visit Summary.  MyChart is used to connect with patients for Virtual Visits (Telemedicine).  Patients are able to view lab/test results, encounter notes, upcoming appointments, etc.  Non-urgent messages can be sent to your provider as well.   To learn more about what you can do with MyChart, go to [email or web address].    Your next appointment:   1 year(s)  Provider:   Peter Jordan, MD    

## 2023-02-13 NOTE — Anesthesia Preprocedure Evaluation (Addendum)
Anesthesia Evaluation  Patient identified by MRN, date of birth, ID band Patient awake    Reviewed: Allergy & Precautions, NPO status , Patient's Chart, lab work & pertinent test results, reviewed documented beta blocker date and time   History of Anesthesia Complications Negative for: history of anesthetic complications  Airway Mallampati: II  TM Distance: >3 FB Neck ROM: Full    Dental no notable dental hx.    Pulmonary sleep apnea and Continuous Positive Airway Pressure Ventilation , former smoker   Pulmonary exam normal        Cardiovascular hypertension, Pt. on medications and Pt. on home beta blockers + CAD, + Past MI and + Cardiac Stents (2012 to RCA)  Normal cardiovascular exam     Neuro/Psych  Headaches CVA, No Residual Symptoms    GI/Hepatic Neg liver ROS,GERD  Medicated,,  Endo/Other  diabetes, Type 2, Oral Hypoglycemic Agents    Renal/GU negative Renal ROS     Musculoskeletal  (+) Arthritis ,    Abdominal   Peds  Hematology negative hematology ROS (+)   Anesthesia Other Findings Day of surgery medications reviewed with patient.  Reproductive/Obstetrics negative OB ROS                              Anesthesia Physical Anesthesia Plan  ASA: 3  Anesthesia Plan: Spinal   Post-op Pain Management: Ofirmev IV (intra-op)* and Regional block*   Induction:   PONV Risk Score and Plan: 2 and Treatment may vary due to age or medical condition, Ondansetron, Propofol infusion and Dexamethasone  Airway Management Planned: Natural Airway and Simple Face Mask  Additional Equipment: None  Intra-op Plan:   Post-operative Plan:   Informed Consent: I have reviewed the patients History and Physical, chart, labs and discussed the procedure including the risks, benefits and alternatives for the proposed anesthesia with the patient or authorized representative who has indicated his/her  understanding and acceptance.       Plan Discussed with: CRNA  Anesthesia Plan Comments: (See PAT note 02/07/2023)        Anesthesia Quick Evaluation

## 2023-02-13 NOTE — Progress Notes (Signed)
Anesthesia Chart Review   Case: [id number] Date/Time: 02/20/23 1500   Procedure: TOTAL KNEE ARTHROPLASTY (Right: Knee)   Anesthesia type: Choice   Pre-op diagnosis: right knee osteoarthritis   Location: [facility] / [facility]   Surgeons: Gaynelle Arabian, MD       DISCUSSION:77 y.o. former smoker with h/o GERD, HTN, OSA on CPAP, CAD (DES to proximal RCA 09/2011), Stroke, DM II, right knee OA scheduled for above procedure 02/20/2023 with Dr. Gaynelle Arabian.   Pt last seen by cardiology 02/09/2023. Per OV note, "Pending right knee medial unicompartmental arthroplasty with Dr. Maureen Ralphs scheduled for 02/20/2023. According to the RCRI, patient has a 6.6% risk of MACE. Patient reports activity equivalent to 5.62 METS (per [facility]). Based on ACC/AHA guidelines, Robert [name] would be at acceptable risk for the planned procedure without further cardiovascular testing."  Anticipate pt can proceed with planned procedure barring acute status change.   VS: BP 124/60   Pulse (!) 58   Temp 36.6 C (Oral)   Resp 16   Ht 6\' 1"  (1.854 m)   Wt 91.6 kg   SpO2 100%   BMI 26.65 kg/m   PROVIDERS: Vivi Barrack, MD is PCP   Primary Cardiologist:  Peter Martinique, MD  LABS: Labs reviewed: Acceptable for surgery. (all labs ordered are listed, but only abnormal results are displayed)  Labs Reviewed  HEMOGLOBIN A1C - Abnormal; Notable for the following components:      Result Value   Hgb A1c MFr Bld 7.4 (*)    All other components within normal limits  BASIC METABOLIC PANEL - Abnormal; Notable for the following components:   Glucose, Bld 157 (*)    All other components within normal limits  CBC - Abnormal; Notable for the following components:   Platelets 145 (*)    All other components within normal limits  GLUCOSE, CAPILLARY - Abnormal; Notable for the following components:   Glucose-Capillary 150 (*)    All other components within normal limits  SURGICAL PCR SCREEN     IMAGES:   EKG:   CV: Echo  12/16/21 1. Left ventricular ejection fraction, by estimation, is 60 to 65%. Left  ventricular ejection fraction by 3D volume is 65 %. The left ventricle has  normal function. The left ventricle has no regional wall motion  abnormalities. There is mild concentric  left ventricular hypertrophy. Left ventricular diastolic parameters were  normal.   2. Right ventricular systolic function is normal. The right ventricular  size is normal. Tricuspid regurgitation signal is inadequate for assessing  PA pressure.   3. The mitral valve is normal in structure. Trivial mitral valve  regurgitation. No evidence of mitral stenosis.   4. The aortic valve is tricuspid. Aortic valve regurgitation is not  visualized. Aortic valve sclerosis/calcification is present, without any  evidence of aortic stenosis.   5. The inferior vena cava is normal in size with greater than 50%  respiratory variability, suggesting right atrial pressure of 3 mmHg.   Past Medical History:  Diagnosis Date   Arthritis    CAD (coronary artery disease) 10/10/2011   NSTEMI with DES to the proximal RCA following flow wire evaluation. EF is normal.    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   CORONARY ANGIOPLASTY WITH STENT PLACEMENT  10/13/2011   DES to the proximal RCA.  Dr Martinique   FRACTIONAL FLOW RESERVE WIRE Right 10/13/2011   Procedure: [place];  Surgeon: Peter M Martinique, MD;  Location: [facility] CATH LAB;  Service: Cardiovascular;  Laterality: Right;   HERNIA REPAIR     INGUINAL HERNIA REPAIR  10/23/2012   Procedure: HERNIA REPAIR INGUINAL ADULT;  Surgeon: Odis Hollingshead, MD;  Location: [place];  Service: General;  Laterality: Left;  Left lower quadrant; left inguinal hernia repair with mesh   INSERTION OF MESH  10/23/2012   Procedure: INSERTION OF MESH;  Surgeon: Odis Hollingshead, MD;  Location: [place];  Service: General;  Laterality: Left;  Left lower quadrant   LEFT HEART CATHETERIZATION WITH CORONARY ANGIOGRAM N/A 10/13/2011   Procedure: LEFT HEART CATHETERIZATION WITH CORONARY ANGIOGRAM;  Surgeon: Peter M Martinique, MD;  Location: [facility] CATH LAB;  Service: Cardiovascular;  Laterality: N/A;   PERCUTANEOUS CORONARY STENT INTERVENTION (PCI-S) Right 10/13/2011   Procedure: PERCUTANEOUS CORONARY STENT INTERVENTION (PCI-S);  Surgeon: Peter M Martinique, MD;  Location: [facility] CATH LAB;  Service: Cardiovascular;  Laterality: Right;   SHOULDER ARTHROSCOPY W/ ROTATOR CUFF REPAIR Left 1990's   TOTAL HIP ARTHROPLASTY Left 05/14/2021   Procedure: LEFT TOTAL HIP ARTHROPLASTY ANTERIOR APPROACH;  Surgeon: Mcarthur Rossetti, MD;  Location: [facility];  Service: Orthopedics;  Laterality: Left;   TRIGGER FINGER RELEASE Left    3rd and 4th digits    MEDICATIONS:  acetaminophen (TYLENOL) 500 MG tablet   Ascorbic Acid (VITAMIN C) 1000 MG tablet   aspirin EC 81 MG tablet   azelastine (ASTELIN) 0.1 % nasal spray   B Complex-C (SUPER B COMPLEX PO)   brimonidine (ALPHAGAN) 0.2 % ophthalmic solution   ciprofloxacin (CIPRO) 500 MG tablet   dicyclomine (BENTYL) 10 MG capsule   ezetimibe (ZETIA) 10 MG tablet   fluticasone (FLONASE) 50 MCG/ACT nasal spray   Fremanezumab-vfrm (AJOVY) 225 MG/1.5ML SOAJ   latanoprost (XALATAN) 0.005 % ophthalmic solution   metFORMIN  (GLUCOPHAGE) 1000 MG tablet   metoprolol tartrate (LOPRESSOR) 25 MG tablet   Multiple Vitamins-Minerals (MULTIVITAMINS THER. W/MINERALS) TABS   nitroGLYCERIN (NITROSTAT) 0.4 MG SL tablet   pantoprazole (PROTONIX) 40 MG tablet   PRESCRIPTION MEDICATION   Rimegepant Sulfate (NURTEC) 75 MG TBDP   rosuvastatin (CRESTOR) 40 MG tablet   tamsulosin (FLOMAX) 0.4 MG CAPS capsule   traMADol (ULTRAM) 50 MG tablet   vitamin E 180 MG (400 UNITS) capsule   No current facility-administered medications for this encounter.   Konrad Felix Ward, PA-C [facility] [phone]

## 2023-02-14 ENCOUNTER — Other Ambulatory Visit: Payer: Self-pay

## 2023-02-14 ENCOUNTER — Telehealth: Payer: Self-pay | Admitting: Family Medicine

## 2023-02-14 MED ORDER — CIPROFLOXACIN HCL 500 MG PO TABS: 500.0000 mg | ORAL_TABLET | Freq: Two times a day (BID) | ORAL | 0 refills | Status: AC

## 2023-02-14 NOTE — Telephone Encounter (Signed)
Looks like Dr Cherlynn Kaiser sent in for 10 days. Please send in for an additional 18 days.  Algis Greenhouse. Jerline Pain, MD 02/14/2023 3:13 PM

## 2023-02-14 NOTE — Telephone Encounter (Signed)
Rx sent 

## 2023-02-14 NOTE — Telephone Encounter (Signed)
Patient requests to be advised re: Patient states the antibiotics have just started taking effect and he only has a 2 day supply left. States he would like to take the antibiotic for additional 10 days.

## 2023-02-18 ENCOUNTER — Other Ambulatory Visit: Payer: Self-pay | Admitting: Family Medicine

## 2023-02-20 ENCOUNTER — Observation Stay (HOSPITAL_COMMUNITY)
Admission: RE | Admit: 2023-02-20 | Discharge: 2023-02-21 | Disposition: A | Discharge status: Home / Self Care | Payer: Medicare Other | Source: Ambulatory Visit | Attending: Orthopedic Surgery | Admitting: Orthopedic Surgery

## 2023-02-20 ENCOUNTER — Ambulatory Visit (HOSPITAL_BASED_OUTPATIENT_CLINIC_OR_DEPARTMENT_OTHER): Payer: Medicare Other | Admitting: Certified Registered Nurse Anesthetist

## 2023-02-20 ENCOUNTER — Ambulatory Visit (HOSPITAL_COMMUNITY): Payer: Medicare Other | Admitting: Emergency Medicine

## 2023-02-20 ENCOUNTER — Other Ambulatory Visit: Payer: Self-pay

## 2023-02-20 ENCOUNTER — Encounter (HOSPITAL_COMMUNITY): Payer: Self-pay | Admitting: Orthopedic Surgery

## 2023-02-20 ENCOUNTER — Encounter (HOSPITAL_COMMUNITY)
Admission: RE | Disposition: A | Discharge status: Home / Self Care | Payer: Self-pay | Source: Ambulatory Visit | Attending: Orthopedic Surgery

## 2023-02-20 DIAGNOSIS — Z7982 Long term (current) use of aspirin: Secondary | ICD-10-CM | POA: Insufficient documentation

## 2023-02-20 DIAGNOSIS — I2510 Atherosclerotic heart disease of native coronary artery without angina pectoris: Secondary | ICD-10-CM

## 2023-02-20 DIAGNOSIS — Z85828 Personal history of other malignant neoplasm of skin: Secondary | ICD-10-CM | POA: Diagnosis not present

## 2023-02-20 DIAGNOSIS — M1711 Unilateral primary osteoarthritis, right knee: Secondary | ICD-10-CM

## 2023-02-20 DIAGNOSIS — Z7952 Long term (current) use of systemic steroids: Secondary | ICD-10-CM | POA: Insufficient documentation

## 2023-02-20 DIAGNOSIS — E119 Type 2 diabetes mellitus without complications: Secondary | ICD-10-CM | POA: Insufficient documentation

## 2023-02-20 DIAGNOSIS — I10 Essential (primary) hypertension: Secondary | ICD-10-CM

## 2023-02-20 DIAGNOSIS — Z96642 Presence of left artificial hip joint: Secondary | ICD-10-CM | POA: Insufficient documentation

## 2023-02-20 DIAGNOSIS — Z79899 Other long term (current) drug therapy: Secondary | ICD-10-CM | POA: Insufficient documentation

## 2023-02-20 DIAGNOSIS — Z8673 Personal history of transient ischemic attack (TIA), and cerebral infarction without residual deficits: Secondary | ICD-10-CM | POA: Insufficient documentation

## 2023-02-20 DIAGNOSIS — Z955 Presence of coronary angioplasty implant and graft: Secondary | ICD-10-CM | POA: Insufficient documentation

## 2023-02-20 DIAGNOSIS — Z87891 Personal history of nicotine dependence: Secondary | ICD-10-CM | POA: Diagnosis not present

## 2023-02-20 DIAGNOSIS — M179 Osteoarthritis of knee, unspecified: Secondary | ICD-10-CM

## 2023-02-20 DIAGNOSIS — Z01818 Encounter for other preprocedural examination: Secondary | ICD-10-CM

## 2023-02-20 DIAGNOSIS — M1990 Unspecified osteoarthritis, unspecified site: Secondary | ICD-10-CM | POA: Diagnosis present

## 2023-02-20 DIAGNOSIS — I252 Old myocardial infarction: Secondary | ICD-10-CM | POA: Diagnosis not present

## 2023-02-20 HISTORY — PX: TOTAL KNEE ARTHROPLASTY: SHX125

## 2023-02-20 LAB — GLUCOSE, CAPILLARY
Glucose-Capillary: 126 mg/dL — ABNORMAL HIGH (ref 70–99)
Glucose-Capillary: 261 mg/dL — ABNORMAL HIGH (ref 70–99)

## 2023-02-20 SURGERY — ARTHROPLASTY, KNEE, TOTAL
Anesthesia: Spinal | Site: Knee | Laterality: Right

## 2023-02-20 MED ORDER — FLEET ENEMA 7-19 GM/118ML RE ENEM: 1.0000 | ENEMA | Freq: Once | RECTAL | Status: DC | PRN

## 2023-02-20 MED ORDER — ONDANSETRON HCL 4 MG/2ML IJ SOLN
INTRAMUSCULAR | Status: AC
  Filled 2023-02-20: qty 2

## 2023-02-20 MED ORDER — FENTANYL CITRATE PF 50 MCG/ML IJ SOSY
50.0000 ug | PREFILLED_SYRINGE | INTRAMUSCULAR | Status: DC
  Filled 2023-02-20: qty 2

## 2023-02-20 MED ORDER — CEFAZOLIN SODIUM-DEXTROSE 2-4 GM/100ML-% IV SOLN
2.0000 g | INTRAVENOUS | Status: AC
  Administered 2023-02-20: 2 g via INTRAVENOUS
  Filled 2023-02-20: qty 100

## 2023-02-20 MED ORDER — CLONIDINE HCL (ANALGESIA) 100 MCG/ML EP SOLN
EPIDURAL | Status: DC | PRN
  Administered 2023-02-20: 100 ug

## 2023-02-20 MED ORDER — ROCURONIUM BROMIDE 10 MG/ML (PF) SYRINGE
PREFILLED_SYRINGE | INTRAVENOUS | Status: AC
  Filled 2023-02-20: qty 10

## 2023-02-20 MED ORDER — PANTOPRAZOLE SODIUM 40 MG PO TBEC
40.0000 mg | DELAYED_RELEASE_TABLET | Freq: Every day | ORAL | Status: DC
  Administered 2023-02-21: 40 mg via ORAL
  Filled 2023-02-20: qty 1

## 2023-02-20 MED ORDER — METHOCARBAMOL 500 MG PO TABS
500.0000 mg | ORAL_TABLET | Freq: Four times a day (QID) | ORAL | Status: DC | PRN
  Administered 2023-02-20 – 2023-02-21 (×2): 500 mg via ORAL
  Filled 2023-02-20 (×2): qty 1

## 2023-02-20 MED ORDER — FENTANYL CITRATE PF 50 MCG/ML IJ SOSY: 25.0000 ug | PREFILLED_SYRINGE | INTRAMUSCULAR | Status: DC | PRN

## 2023-02-20 MED ORDER — FENTANYL CITRATE (PF) 100 MCG/2ML IJ SOLN
INTRAMUSCULAR | Status: AC
  Filled 2023-02-20: qty 2

## 2023-02-20 MED ORDER — METOCLOPRAMIDE HCL 5 MG PO TABS: 5.0000 mg | ORAL_TABLET | Freq: Three times a day (TID) | ORAL | Status: DC | PRN

## 2023-02-20 MED ORDER — METHOCARBAMOL 1000 MG/10ML IJ SOLN: 500.0000 mg | Freq: Four times a day (QID) | INTRAVENOUS | Status: DC | PRN

## 2023-02-20 MED ORDER — STERILE WATER FOR IRRIGATION IR SOLN
Status: DC | PRN
  Administered 2023-02-20: 2000 mL

## 2023-02-20 MED ORDER — POLYETHYLENE GLYCOL 3350 17 G PO PACK: 17.0000 g | PACK | Freq: Every day | ORAL | Status: DC | PRN

## 2023-02-20 MED ORDER — EPHEDRINE 5 MG/ML INJ
INTRAVENOUS | Status: AC
  Filled 2023-02-20: qty 5

## 2023-02-20 MED ORDER — BUPIVACAINE LIPOSOME 1.3 % IJ SUSP
INTRAMUSCULAR | Status: DC | PRN
  Administered 2023-02-20: 20 mL

## 2023-02-20 MED ORDER — NITROGLYCERIN 0.4 MG SL SUBL: 0.4000 mg | SUBLINGUAL_TABLET | SUBLINGUAL | Status: DC | PRN

## 2023-02-20 MED ORDER — LACTATED RINGERS IV SOLN: INTRAVENOUS | Status: DC

## 2023-02-20 MED ORDER — HYDROMORPHONE HCL 2 MG PO TABS
1.0000 mg | ORAL_TABLET | ORAL | Status: DC | PRN
  Administered 2023-02-20 – 2023-02-21 (×3): 2 mg via ORAL
  Filled 2023-02-20 (×3): qty 1

## 2023-02-20 MED ORDER — METOCLOPRAMIDE HCL 5 MG/ML IJ SOLN: 5.0000 mg | Freq: Three times a day (TID) | INTRAMUSCULAR | Status: DC | PRN

## 2023-02-20 MED ORDER — PROPOFOL 500 MG/50ML IV EMUL
INTRAVENOUS | Status: DC | PRN
  Administered 2023-02-20: 50 ug/kg/min via INTRAVENOUS

## 2023-02-20 MED ORDER — DIPHENHYDRAMINE HCL 12.5 MG/5ML PO ELIX: 12.5000 mg | ORAL_SOLUTION | ORAL | Status: DC | PRN

## 2023-02-20 MED ORDER — TRAMADOL HCL 50 MG PO TABS
50.0000 mg | ORAL_TABLET | Freq: Four times a day (QID) | ORAL | Status: DC | PRN
  Administered 2023-02-21 (×2): 100 mg via ORAL
  Filled 2023-02-20 (×2): qty 2

## 2023-02-20 MED ORDER — ACETAMINOPHEN 10 MG/ML IV SOLN
1000.0000 mg | Freq: Once | INTRAVENOUS | Status: AC
  Administered 2023-02-20: 1000 mg via INTRAVENOUS
  Filled 2023-02-20: qty 100

## 2023-02-20 MED ORDER — INSULIN ASPART 100 UNIT/ML IJ SOLN
0.0000 [IU] | Freq: Every day | INTRAMUSCULAR | Status: DC
  Administered 2023-02-20: 0.3 [IU] via SUBCUTANEOUS

## 2023-02-20 MED ORDER — ACETAMINOPHEN 500 MG PO TABS
1000.0000 mg | ORAL_TABLET | Freq: Four times a day (QID) | ORAL | Status: DC
  Administered 2023-02-20 – 2023-02-21 (×3): 1000 mg via ORAL
  Filled 2023-02-20 (×3): qty 2

## 2023-02-20 MED ORDER — CHLORHEXIDINE GLUCONATE 0.12 % MT SOLN
15.0000 mL | Freq: Once | OROMUCOSAL | Status: AC
  Administered 2023-02-20: 15 mL via OROMUCOSAL

## 2023-02-20 MED ORDER — PROPOFOL 10 MG/ML IV BOLUS
INTRAVENOUS | Status: DC | PRN
  Administered 2023-02-20 (×2): 20 mg via INTRAVENOUS
  Administered 2023-02-20: 10 mg via INTRAVENOUS
  Administered 2023-02-20: 20 mg via INTRAVENOUS
  Administered 2023-02-20 (×3): 10 mg via INTRAVENOUS

## 2023-02-20 MED ORDER — METOPROLOL TARTRATE 12.5 MG HALF TABLET
12.5000 mg | ORAL_TABLET | Freq: Every day | ORAL | Status: DC
  Administered 2023-02-21: 12.5 mg via ORAL
  Filled 2023-02-20: qty 1

## 2023-02-20 MED ORDER — TRANEXAMIC ACID-NACL 1000-0.7 MG/100ML-% IV SOLN
1000.0000 mg | INTRAVENOUS | Status: AC
  Administered 2023-02-20: 1000 mg via INTRAVENOUS
  Filled 2023-02-20: qty 100

## 2023-02-20 MED ORDER — PROPOFOL 500 MG/50ML IV EMUL
INTRAVENOUS | Status: AC
  Filled 2023-02-20: qty 50

## 2023-02-20 MED ORDER — SODIUM CHLORIDE (PF) 0.9 % IJ SOLN
INTRAMUSCULAR | Status: AC
  Filled 2023-02-20: qty 10

## 2023-02-20 MED ORDER — SODIUM CHLORIDE 0.9 % IR SOLN
Status: DC | PRN
  Administered 2023-02-20: 1000 mL

## 2023-02-20 MED ORDER — DEXAMETHASONE SODIUM PHOSPHATE 10 MG/ML IJ SOLN
8.0000 mg | Freq: Once | INTRAMUSCULAR | Status: AC
  Administered 2023-02-20: 8 mg via INTRAVENOUS

## 2023-02-20 MED ORDER — ROSUVASTATIN CALCIUM 20 MG PO TABS
40.0000 mg | ORAL_TABLET | Freq: Every day | ORAL | Status: DC
  Administered 2023-02-21: 40 mg via ORAL
  Filled 2023-02-20: qty 2

## 2023-02-20 MED ORDER — BUPIVACAINE LIPOSOME 1.3 % IJ SUSP
INTRAMUSCULAR | Status: AC
  Filled 2023-02-20: qty 20

## 2023-02-20 MED ORDER — PROPOFOL 10 MG/ML IV BOLUS
INTRAVENOUS | Status: AC
  Filled 2023-02-20: qty 20

## 2023-02-20 MED ORDER — 0.9 % SODIUM CHLORIDE (POUR BTL) OPTIME
TOPICAL | Status: DC | PRN
  Administered 2023-02-20: 1000 mL

## 2023-02-20 MED ORDER — POVIDONE-IODINE 10 % EX SWAB
2.0000 | Freq: Once | CUTANEOUS | Status: AC
  Administered 2023-02-20: 2 via TOPICAL

## 2023-02-20 MED ORDER — EPHEDRINE SULFATE-NACL 50-0.9 MG/10ML-% IV SOSY
PREFILLED_SYRINGE | INTRAVENOUS | Status: DC | PRN
  Administered 2023-02-20: 5 mg via INTRAVENOUS

## 2023-02-20 MED ORDER — AMISULPRIDE (ANTIEMETIC) 5 MG/2ML IV SOLN: 10.0000 mg | Freq: Once | INTRAVENOUS | Status: DC | PRN

## 2023-02-20 MED ORDER — BUPIVACAINE IN DEXTROSE 0.75-8.25 % IT SOLN
INTRATHECAL | Status: DC | PRN
  Administered 2023-02-20: 1.6 mL via INTRATHECAL

## 2023-02-20 MED ORDER — LIDOCAINE HCL (PF) 2 % IJ SOLN
INTRAMUSCULAR | Status: AC
  Filled 2023-02-20: qty 10

## 2023-02-20 MED ORDER — BISACODYL 10 MG RE SUPP: 10.0000 mg | Freq: Every day | RECTAL | Status: DC | PRN

## 2023-02-20 MED ORDER — DEXAMETHASONE SODIUM PHOSPHATE 10 MG/ML IJ SOLN
INTRAMUSCULAR | Status: AC
  Filled 2023-02-20: qty 2

## 2023-02-20 MED ORDER — PHENOL 1.4 % MT LIQD: 1.0000 | OROMUCOSAL | Status: DC | PRN

## 2023-02-20 MED ORDER — DEXAMETHASONE SODIUM PHOSPHATE 10 MG/ML IJ SOLN
INTRAMUSCULAR | Status: AC
  Filled 2023-02-20: qty 1

## 2023-02-20 MED ORDER — ONDANSETRON HCL 4 MG PO TABS: 4.0000 mg | ORAL_TABLET | Freq: Four times a day (QID) | ORAL | Status: DC | PRN

## 2023-02-20 MED ORDER — ONDANSETRON HCL 4 MG/2ML IJ SOLN: 4.0000 mg | Freq: Four times a day (QID) | INTRAMUSCULAR | Status: DC | PRN

## 2023-02-20 MED ORDER — SODIUM CHLORIDE (PF) 0.9 % IJ SOLN
INTRAMUSCULAR | Status: DC | PRN
  Administered 2023-02-20: 60 mL

## 2023-02-20 MED ORDER — CEFAZOLIN SODIUM-DEXTROSE 2-4 GM/100ML-% IV SOLN
2.0000 g | Freq: Four times a day (QID) | INTRAVENOUS | Status: AC
  Administered 2023-02-20 – 2023-02-21 (×2): 2 g via INTRAVENOUS
  Filled 2023-02-20 (×2): qty 100

## 2023-02-20 MED ORDER — TAMSULOSIN HCL 0.4 MG PO CAPS
0.4000 mg | ORAL_CAPSULE | Freq: Every day | ORAL | Status: DC
  Administered 2023-02-20 – 2023-02-21 (×2): 0.4 mg via ORAL
  Filled 2023-02-20 (×2): qty 1

## 2023-02-20 MED ORDER — BUPIVACAINE LIPOSOME 1.3 % IJ SUSP: 20.0000 mL | Freq: Once | INTRAMUSCULAR | Status: DC

## 2023-02-20 MED ORDER — PROPOFOL 1000 MG/100ML IV EMUL
INTRAVENOUS | Status: AC
  Filled 2023-02-20: qty 100

## 2023-02-20 MED ORDER — PHENYLEPHRINE HCL-NACL 20-0.9 MG/250ML-% IV SOLN
INTRAVENOUS | Status: DC | PRN
  Administered 2023-02-20: 25 ug/min via INTRAVENOUS

## 2023-02-20 MED ORDER — ONDANSETRON HCL 4 MG/2ML IJ SOLN
INTRAMUSCULAR | Status: AC
  Filled 2023-02-20: qty 4

## 2023-02-20 MED ORDER — BUPIVACAINE-EPINEPHRINE (PF) 0.5% -1:200000 IJ SOLN
INTRAMUSCULAR | Status: DC | PRN
  Administered 2023-02-20: 15 mL via PERINEURAL

## 2023-02-20 MED ORDER — FENTANYL CITRATE (PF) 100 MCG/2ML IJ SOLN
INTRAMUSCULAR | Status: DC | PRN
  Administered 2023-02-20 (×2): 50 ug via INTRAVENOUS

## 2023-02-20 MED ORDER — SODIUM CHLORIDE (PF) 0.9 % IJ SOLN
INTRAMUSCULAR | Status: AC
  Filled 2023-02-20: qty 50

## 2023-02-20 MED ORDER — MENTHOL 3 MG MT LOZG: 1.0000 | LOZENGE | OROMUCOSAL | Status: DC | PRN

## 2023-02-20 MED ORDER — ONDANSETRON HCL 4 MG/2ML IJ SOLN
INTRAMUSCULAR | Status: DC | PRN
  Administered 2023-02-20: 4 mg via INTRAVENOUS

## 2023-02-20 MED ORDER — SODIUM CHLORIDE 0.9 % IV SOLN: INTRAVENOUS | Status: DC

## 2023-02-20 MED ORDER — DOCUSATE SODIUM 100 MG PO CAPS
100.0000 mg | ORAL_CAPSULE | Freq: Two times a day (BID) | ORAL | Status: DC
  Administered 2023-02-20 – 2023-02-21 (×2): 100 mg via ORAL
  Filled 2023-02-20 (×3): qty 1

## 2023-02-20 MED ORDER — PROPOFOL 1000 MG/100ML IV EMUL
INTRAVENOUS | Status: AC
  Filled 2023-02-20: qty 300

## 2023-02-20 MED ORDER — EZETIMIBE 10 MG PO TABS
5.0000 mg | ORAL_TABLET | Freq: Every day | ORAL | Status: DC
  Administered 2023-02-21: 5 mg via ORAL
  Filled 2023-02-20: qty 1

## 2023-02-20 MED ORDER — INSULIN ASPART 100 UNIT/ML IJ SOLN
0.0000 [IU] | Freq: Three times a day (TID) | INTRAMUSCULAR | Status: DC
  Administered 2023-02-21: 3 [IU] via SUBCUTANEOUS
  Administered 2023-02-21: 2 [IU] via SUBCUTANEOUS

## 2023-02-20 MED ORDER — ORAL CARE MOUTH RINSE: 15.0000 mL | Freq: Once | OROMUCOSAL | Status: AC

## 2023-02-20 MED ORDER — ASPIRIN 81 MG PO CHEW
81.0000 mg | CHEWABLE_TABLET | Freq: Two times a day (BID) | ORAL | Status: DC
  Administered 2023-02-21: 81 mg via ORAL
  Filled 2023-02-20: qty 1

## 2023-02-20 MED ORDER — DICYCLOMINE HCL 10 MG PO CAPS: 10.0000 mg | ORAL_CAPSULE | Freq: Three times a day (TID) | ORAL | Status: DC | PRN

## 2023-02-20 MED ORDER — HYDROMORPHONE HCL 1 MG/ML IJ SOLN: 0.5000 mg | INTRAMUSCULAR | Status: DC | PRN

## 2023-02-20 SURGICAL SUPPLY — 62 items
ATTUNE MED DOME PAT 41 KNEE (Knees) IMPLANT
ATTUNE PS FEM RT SZ9 CEM KNEE (Femur) IMPLANT
ATTUNE PS RP INSR KNEE SZ9 10 (Insert) IMPLANT
BAG COUNTER SPONGE SURGICOUNT (BAG) IMPLANT
BAG SPEC THK2 15X12 ZIP CLS (MISCELLANEOUS) ×1
BAG SPNG CNTER NS LX DISP (BAG) ×1
BAG ZIPLOCK 12X15 (MISCELLANEOUS) ×1 IMPLANT
BASE TIBIAL ATTUNE KNEE SZ9 (Knees) IMPLANT
BLADE SAG 18X100X1.27 (BLADE) ×1 IMPLANT
BLADE SAW SGTL 11.0X1.19X90.0M (BLADE) ×1 IMPLANT
BNDG CMPR 5X62 HK CLSR LF (GAUZE/BANDAGES/DRESSINGS) ×1
BNDG CMPR MED 10X6 ELC LF (GAUZE/BANDAGES/DRESSINGS) ×1
BNDG ELASTIC 6INX 5YD STR LF (GAUZE/BANDAGES/DRESSINGS) ×1 IMPLANT
BNDG ELASTIC 6X10 VLCR STRL LF (GAUZE/BANDAGES/DRESSINGS) IMPLANT
BOWL SMART MIX CTS (DISPOSABLE) ×1 IMPLANT
BSPLAT TIB 9 CMNT ROT PLAT STR (Knees) ×1 IMPLANT
CEMENT HV SMART SET (Cement) ×2 IMPLANT
CLSR STERI-STRIP ANTIMIC 1/2X4 (GAUZE/BANDAGES/DRESSINGS) IMPLANT
COVER SURGICAL LIGHT HANDLE (MISCELLANEOUS) ×1 IMPLANT
CUFF TOURN SGL QUICK 34 (TOURNIQUET CUFF) ×1
CUFF TRNQT CYL 34X4.125X (TOURNIQUET CUFF) ×1 IMPLANT
DRAPE INCISE IOBAN 66X45 STRL (DRAPES) ×1 IMPLANT
DRAPE U-SHAPE 47X51 STRL (DRAPES) ×1 IMPLANT
DRESSING AQUACEL AG SP 3.5X10 (GAUZE/BANDAGES/DRESSINGS) IMPLANT
DRSG AQUACEL AG ADV 3.5X10 (GAUZE/BANDAGES/DRESSINGS) ×1 IMPLANT
DRSG AQUACEL AG SP 3.5X10 (GAUZE/BANDAGES/DRESSINGS) ×1
DURAPREP 26ML APPLICATOR (WOUND CARE) ×1 IMPLANT
ELECT REM PT RETURN 15FT ADLT (MISCELLANEOUS) ×1 IMPLANT
GLOVE BIO SURGEON STRL SZ 6.5 (GLOVE) IMPLANT
GLOVE BIO SURGEON STRL SZ7.5 (GLOVE) IMPLANT
GLOVE BIO SURGEON STRL SZ8 (GLOVE) ×1 IMPLANT
GLOVE BIOGEL PI IND STRL 6.5 (GLOVE) IMPLANT
GLOVE BIOGEL PI IND STRL 7.0 (GLOVE) IMPLANT
GLOVE BIOGEL PI IND STRL 8 (GLOVE) ×1 IMPLANT
GOWN STRL REUS W/ TWL LRG LVL3 (GOWN DISPOSABLE) ×1 IMPLANT
GOWN STRL REUS W/ TWL XL LVL3 (GOWN DISPOSABLE) IMPLANT
GOWN STRL REUS W/TWL LRG LVL3 (GOWN DISPOSABLE) ×1
GOWN STRL REUS W/TWL XL LVL3 (GOWN DISPOSABLE)
HANDPIECE INTERPULSE COAX TIP (DISPOSABLE) ×1
HOLDER FOLEY CATH W/STRAP (MISCELLANEOUS) IMPLANT
IMMOBILIZER KNEE 20 (SOFTGOODS) ×1
IMMOBILIZER KNEE 20 THIGH 36 (SOFTGOODS) ×1 IMPLANT
KIT TURNOVER KIT A (KITS) IMPLANT
MANIFOLD NEPTUNE II (INSTRUMENTS) ×1 IMPLANT
NS IRRIG 1000ML POUR BTL (IV SOLUTION) ×1 IMPLANT
PACK TOTAL KNEE CUSTOM (KITS) ×1 IMPLANT
PADDING CAST COTTON 6X4 STRL (CAST SUPPLIES) ×2 IMPLANT
PIN STEINMAN FIXATION KNEE (PIN) IMPLANT
PROTECTOR NERVE ULNAR (MISCELLANEOUS) ×1 IMPLANT
SET HNDPC FAN SPRY TIP SCT (DISPOSABLE) ×1 IMPLANT
SPIKE FLUID TRANSFER (MISCELLANEOUS) ×1 IMPLANT
STRIP CLOSURE SKIN 1/2X4 (GAUZE/BANDAGES/DRESSINGS) ×2 IMPLANT
SUT MNCRL AB 4-0 PS2 18 (SUTURE) ×1 IMPLANT
SUT STRATAFIX 0 PDS 27 VIOLET (SUTURE) ×1
SUT VIC AB 2-0 CT1 27 (SUTURE) ×3
SUT VIC AB 2-0 CT1 TAPERPNT 27 (SUTURE) ×3 IMPLANT
SUTURE STRATFX 0 PDS 27 VIOLET (SUTURE) ×1 IMPLANT
TIBIAL BASE ATTUNE KNEE SZ9 (Knees) ×1 IMPLANT
TRAY FOLEY MTR SLVR 16FR STAT (SET/KITS/TRAYS/PACK) ×1 IMPLANT
TUBE SUCTION HIGH CAP CLEAR NV (SUCTIONS) ×1 IMPLANT
WATER STERILE IRR 1000ML POUR (IV SOLUTION) ×2 IMPLANT
WRAP KNEE MAXI GEL POST OP (GAUZE/BANDAGES/DRESSINGS) ×1 IMPLANT

## 2023-02-20 NOTE — Progress Notes (Signed)
Orthopedic Tech Progress Note Patient Details:  Robert [name] Feb 24, 1946 [id number] CPM will be removed at 8:40pm. CPM Right Knee CPM Right Knee: On Right Knee Flexion (Degrees): 40 Right Knee Extension (Degrees): 10  Post Interventions Patient Tolerated: Well  Robert [name] 02/20/2023, 4:48 PM

## 2023-02-20 NOTE — Interval H&P Note (Signed)
History and Physical Interval Note:  02/20/2023 1:55 PM  Robert [name]  has presented today for surgery, with the diagnosis of right knee osteoarthritis.  The various methods of treatment have been discussed with the patient and family. After consideration of risks, benefits and other options for treatment, the patient has consented to  Procedure(s): TOTAL KNEE ARTHROPLASTY (Right) as a surgical intervention.  The patient's history has been reviewed, patient examined, no change in status, stable for surgery.  I have reviewed the patient's chart and labs.  Questions were answered to the patient's satisfaction.     [name]

## 2023-02-20 NOTE — Op Note (Signed)
OPERATIVE REPORT-TOTAL KNEE ARTHROPLASTY   Pre-operative diagnosis- Osteoarthritis  Right knee(s)  Post-operative diagnosis- Osteoarthritis Right knee(s)  Procedure-  Right  Total Knee Arthroplasty  Surgeon- Robert [name]. Nichoals Heyde, MD  Assistant- Arther Abbott, PA-C   Anesthesia-   Adductor canal block and spinal  EBL- 25 ml   Drains None  Tourniquet time-  Total Tourniquet Time Documented: Thigh (Right) - 37 minutes Total: Thigh (Right) - 37 minutes     Complications- None  Condition-PACU - hemodynamically stable.   Brief Clinical Note  Robert [name] is a 77 y.o. year old male with end stage OA of his right knee with progressively worsening pain and dysfunction. He has constant pain, with activity and at rest and significant functional deficits with difficulties even with ADLs. He has had extensive non-op management including analgesics, injections of cortisone and viscosupplements, and home exercise program, but remains in significant pain with significant dysfunction. Radiographs show bone on bone arthritis medial and patellofemoral. He presents now for right Total Knee Arthroplasty.     Procedure in detail---   The patient is brought into the operating room and positioned supine on the operating table. After successful administration of  adductor canal block and spinal,   a tourniquet is placed high on the  Right thigh(s) and the lower extremity is prepped and draped in the usual sterile fashion. Time out is performed by the operating team and then the  Right lower extremity is wrapped in Esmarch, knee flexed and the tourniquet inflated to 300 mmHg.       A midline incision is made with a ten blade through the subcutaneous tissue to the level of the extensor mechanism. A fresh blade is used to make a medial parapatellar arthrotomy. Soft tissue over the proximal medial tibia is subperiosteally elevated to the joint line with a knife and into the semimembranosus bursa with a Cobb  elevator. Soft tissue over the proximal lateral tibia is elevated with attention being paid to avoiding the patellar tendon on the tibial tubercle. The patella is everted, knee flexed 90 degrees and the ACL and PCL are removed. Findings are bone on bone medial and patellofemoral with large global osteophytes.        The drill is used to create a starting hole in the distal femur and the canal is thoroughly irrigated with sterile saline to remove the fatty contents. The 5 degree Right  valgus alignment guide is placed into the femoral canal and the distal femoral cutting block is pinned to remove 9 mm off the distal femur. Resection is made with an oscillating saw.      The tibia is subluxed forward and the menisci are removed. The extramedullary alignment guide is placed referencing proximally at the medial aspect of the tibial tubercle and distally along the second metatarsal axis and tibial crest. The block is pinned to remove 29mm off the more deficient medial  side. Resection is made with an oscillating saw. Size 9is the most appropriate size for the tibia and the proximal tibia is prepared with the modular drill and keel punch for that size.      The femoral sizing guide is placed and size 9 is most appropriate. Rotation is marked off the epicondylar axis and confirmed by creating a rectangular flexion gap at 90 degrees. The size 9 cutting block is pinned in this rotation and the anterior, posterior and chamfer cuts are made with the oscillating saw. The intercondylar block is then placed and that cut  is made.      Trial size 9 tibial component, trial size 9 posterior stabilized femur and a 10  mm posterior stabilized rotating platform insert trial is placed. Full extension is achieved with excellent varus/valgus and anterior/posterior balance throughout full range of motion. The patella is everted and thickness measured to be 27  mm. Free hand resection is taken to 15 mm, a 41 template is placed, lug holes  are drilled, trial patella is placed, and it tracks normally. Osteophytes are removed off the posterior femur with the trial in place. All trials are removed and the cut bone surfaces prepared with pulsatile lavage. Cement is mixed and once ready for implantation, the size 9 tibial implant, size  9 posterior stabilized femoral component, and the size 41 patella are cemented in place and the patella is held with the clamp. The trial insert is placed and the knee held in full extension. The Exparel (20 ml mixed with 60 ml saline) is injected into the extensor mechanism, posterior capsule, medial and lateral gutters and subcutaneous tissues.  All extruded cement is removed and once the cement is hard the permanent 10 mm posterior stabilized rotating platform insert is placed into the tibial tray.      The wound is copiously irrigated with saline solution and the extensor mechanism closed with # 0 Stratofix suture. The tourniquet is released for a total tourniquet time of 37  minutes. Flexion against gravity is 140 degrees and the patella tracks normally. Subcutaneous tissue is closed with 2.0 vicryl and subcuticular with running 4.0 Monocryl. The incision is cleaned and dried and steri-strips and a bulky sterile dressing are applied. The limb is placed into a knee immobilizer and the patient is awakened and transported to recovery in stable condition.      Please note that a surgical assistant was a medical necessity for this procedure in order to perform it in a safe and expeditious manner. Surgical assistant was necessary to retract the ligaments and vital neurovascular structures to prevent injury to them and also necessary for proper positioning of the limb to allow for anatomic placement of the prosthesis.   Robert [name] Robert Provencal, MD    02/20/2023, 4:11 PM

## 2023-02-20 NOTE — Anesthesia Procedure Notes (Signed)
Spinal  Patient location during procedure: OR Start time: 02/20/2023 2:50 PM End time: 02/20/2023 2:55 PM Reason for block: surgical anesthesia Staffing Performed: resident/CRNA  Anesthesiologist: Kaylyn Layer, MD Resident/CRNA: Epimenio Sarin, CRNA Performed by: Epimenio Sarin, CRNA Authorized by: Kaylyn Layer, MD   Preanesthetic Checklist Completed: patient identified, IV checked, risks and benefits discussed, surgical consent, monitors and equipment checked, pre-op evaluation and timeout performed Spinal Block Patient position: sitting Prep: DuraPrep Patient monitoring: heart rate, cardiac monitor, continuous pulse ox and blood pressure Approach: midline Location: L3-4 Injection technique: single-shot Needle Needle type: Pencan  Needle gauge: 24 G Needle length: 10 cm Needle insertion depth: 7.5 cm Assessment Sensory level: T6 Events: CSF return

## 2023-02-20 NOTE — Anesthesia Procedure Notes (Signed)
Anesthesia Regional Block: Adductor canal block   Pre-Anesthetic Checklist: , timeout performed,  Correct Patient, Correct Site, Correct Laterality,  Correct Procedure, Correct Position, site marked,  Risks and benefits discussed,  Pre-op evaluation,  At surgeon's request and post-op pain management  Laterality: Right  Prep: Maximum Sterile Barrier Precautions used, chloraprep       Needles:  Injection technique: Single-shot  Needle Type: Echogenic Stimulator Needle     Needle Length: 9cm  Needle Gauge: 22     Additional Needles:   Procedures:,,,, ultrasound used (permanent image in chart),,    Narrative:  Start time: 02/20/2023 2:29 PM End time: 02/20/2023 2:32 PM Injection made incrementally with aspirations every 5 mL.  Performed by: Personally  Anesthesiologist: Kaylyn Layer, MD  Additional Notes: Risks, benefits, and alternative discussed. Patient gave consent for procedure. Patient prepped and draped in sterile fashion. Sedation administered, patient remains easily responsive to voice. Relevant anatomy identified with ultrasound guidance. Local anesthetic given in 5cc increments with no signs or symptoms of intravascular injection. No pain or paraesthesias with injection. Patient monitored throughout procedure with signs of LAST or immediate complications. Tolerated well. Ultrasound image placed in chart.  Robert Greenhouse, MD

## 2023-02-20 NOTE — Transfer of Care (Signed)
Immediate Anesthesia Transfer of Care Note  Patient: Robert [name]  Procedure(s) Performed: TOTAL KNEE ARTHROPLASTY (Right: Knee)  Patient Location: PACU  Anesthesia Type:Spinal  Level of Consciousness: drowsy and patient cooperative  Airway & Oxygen Therapy: Patient Spontanous Breathing and Patient connected to face mask oxygen  Post-op Assessment: Report given to RN and Post -op Vital signs reviewed and stable  Post vital signs: Reviewed and stable  Last Vitals:  Vitals Value Taken Time  BP 123/55 02/20/23 1630  Temp    Pulse 51 02/20/23 1632  Resp 14 02/20/23 1632  SpO2 100 % 02/20/23 1632  Vitals shown include unvalidated device data.  Last Pain:  Vitals:   02/20/23 1330  TempSrc:   PainSc: 5          Complications: No notable events documented.

## 2023-02-20 NOTE — Anesthesia Postprocedure Evaluation (Signed)
Anesthesia Post Note  Patient: Robert [name]  Procedure(s) Performed: TOTAL KNEE ARTHROPLASTY (Right: Knee)     Patient location during evaluation: PACU Anesthesia Type: Spinal Level of consciousness: awake and alert Pain management: pain level controlled Vital Signs Assessment: post-procedure vital signs reviewed and stable Respiratory status: spontaneous breathing, nonlabored ventilation and respiratory function stable Cardiovascular status: blood pressure returned to baseline Postop Assessment: no apparent nausea or vomiting, spinal receding, no headache and no backache Anesthetic complications: no   No notable events documented.  Last Vitals:  Vitals:   02/20/23 1645 02/20/23 1700  BP: (!) 105/47 (!) 129/53  Pulse: (!) 50 (!) 50  Resp: 14 12  Temp:    SpO2: 96% 98%    Last Pain:  Vitals:   02/20/23 1700  TempSrc:   PainSc: 0-No pain                 Shanda Howells

## 2023-02-20 NOTE — Discharge Instructions (Signed)
Robert Gross, MD Total Joint Specialist [facility] [place] [address] [place], [place] [address] [phone]  TOTAL KNEE REPLACEMENT POSTOPERATIVE DIRECTIONS    Knee Rehabilitation, Guidelines Following Surgery  Results after knee surgery are often greatly improved when you follow the exercise, range of motion and muscle strengthening exercises prescribed by your doctor. Safety measures are also important to protect the knee from further injury. If any of these exercises cause you to have increased pain or swelling in your knee joint, decrease the amount until you are comfortable again and slowly increase them. If you have problems or questions, call your caregiver or physical therapist for advice.   PHYSICAL THERAPY You are scheduled to begin PT on Thursday, 4/11 at 3:30 pm at our [place] Location [facility])  BLOOD CLOT PREVENTION Take an 81 mg Aspirin two times a day for three weeks following surgery. Then resume one 81 mg Aspirin once a day. You may resume your vitamins/supplements upon discharge from the hospital. Do not take any NSAIDs (Advil, Aleve, Ibuprofen, Meloxicam, etc.) until you are 3 weeks out from surgery  HOME CARE INSTRUCTIONS  Remove items at home which could result in a fall. This includes throw rugs or furniture in walking pathways.  ICE to the affected knee as much as tolerated. Icing helps control swelling. If the swelling is well controlled you will be more comfortable and rehab easier. Continue to use ice on the knee for pain and swelling from surgery. You may notice swelling that will progress down to the foot and ankle. This is normal after surgery. Elevate the leg when you are not up walking on it.    Continue to use the breathing machine which will help keep your temperature down. It is common for your temperature to cycle up and down following surgery, especially at night when you are not up moving around and exerting  yourself. The breathing machine keeps your lungs expanded and your temperature down. Do not place pillow under the operative knee, focus on keeping the knee straight while resting  DIET You may resume your previous home diet once you are discharged from the hospital.  DRESSING / WOUND CARE / SHOWERING Keep your bulky bandage on for 2 days. On the third post-operative day you may remove the Ace bandage and gauze. There is a waterproof adhesive bandage on your skin which will stay in place until your first follow-up appointment. Once you remove this you will not need to place another bandage You may begin showering 3 days following surgery, but do not submerge the incision under water.  ACTIVITY For the first 5 days, the key is rest and control of pain and swelling Do your home exercises twice a day starting on post-operative day 3. On the days you go to physical therapy, just do the home exercises once that day. You should rest, ice and elevate the leg for 50 minutes out of every hour. Get up and walk/stretch for 10 minutes per hour. After 5 days you can increase your activity slowly as tolerated. Walk with your walker as instructed. Use the walker until you are comfortable transitioning to a cane. Walk with the cane in the opposite hand of the operative leg. You may discontinue the cane once you are comfortable and walking steadily. Avoid periods of inactivity such as sitting longer than an hour when not asleep. This helps prevent blood clots.  You may discontinue the knee immobilizer once you are able to perform a straight  leg raise while lying down. You may resume a sexual relationship in one month or when given the OK by your doctor.  You may return to work once you are cleared by your doctor.  Do not drive a car for 6 weeks or until released by your surgeon.  Do not drive while taking narcotics.  TED HOSE STOCKINGS Wear the elastic stockings on both legs for three weeks following surgery  during the day. You may remove them at night for sleeping.  WEIGHT BEARING Weight bearing as tolerated with assist device (walker, cane, etc) as directed, use it as long as suggested by your surgeon or therapist, typically at least 4-6 weeks.  POSTOPERATIVE CONSTIPATION PROTOCOL Constipation - defined medically as fewer than three stools per week and severe constipation as less than one stool per week.  One of the most common issues patients have following surgery is constipation.  Even if you have a regular bowel pattern at home, your normal regimen is likely to be disrupted due to multiple reasons following surgery.  Combination of anesthesia, postoperative narcotics, change in appetite and fluid intake all can affect your bowels.  In order to avoid complications following surgery, here are some recommendations in order to help you during your recovery period.  Colace (docusate) - Pick up an over-the-counter form of Colace or another stool softener and take twice a day as long as you are requiring postoperative pain medications.  Take with a full glass of water daily.  If you experience loose stools or diarrhea, hold the colace until you stool forms back up. If your symptoms do not get better within 1 week or if they get worse, check with your doctor. Dulcolax (bisacodyl) - Pick up over-the-counter and take as directed by the product packaging as needed to assist with the movement of your bowels.  Take with a full glass of water.  Use this product as needed if not relieved by Colace only.  MiraLax (polyethylene glycol) - Pick up over-the-counter to have on hand. MiraLax is a solution that will increase the amount of water in your bowels to assist with bowel movements.  Take as directed and can mix with a glass of water, juice, soda, coffee, or tea. Take if you go more than two days without a movement. Do not use MiraLax more than once per day. Call your doctor if you are still constipated or irregular  after using this medication for 7 days in a row.  If you continue to have problems with postoperative constipation, please contact the office for further assistance and recommendations.  If you experience "the worst abdominal pain ever" or develop nausea or vomiting, please contact the office immediatly for further recommendations for treatment.  ITCHING If you experience itching with your medications, try taking only a single pain pill, or even half a pain pill at a time.  You can also use Benadryl over the counter for itching or also to help with sleep.   MEDICATIONS See your medication summary on the "After Visit Summary" that the nursing staff will review with you prior to discharge.  You may have some home medications which will be placed on hold until you complete the course of blood thinner medication.  It is important for you to complete the blood thinner medication as prescribed by your surgeon.  Continue your approved medications as instructed at time of discharge.  PRECAUTIONS If you experience chest pain or shortness of breath - call 911 immediately for transfer to  the hospital emergency department.  If you develop a fever greater that 101 F, purulent drainage from wound, increased redness or drainage from wound, foul odor from the wound/dressing, or calf pain - CONTACT YOUR SURGEON.                                                   FOLLOW-UP APPOINTMENTS Make sure you keep all of your appointments after your operation with your surgeon and caregivers. You should call the office at the above phone number and make an appointment for approximately two weeks after the date of your surgery or on the date instructed by your surgeon outlined in the "After Visit Summary".  RANGE OF MOTION AND STRENGTHENING EXERCISES  Rehabilitation of the knee is important following a knee injury or an operation. After just a few days of immobilization, the muscles of the thigh which control the knee become  weakened and shrink (atrophy). Knee exercises are designed to build up the tone and strength of the thigh muscles and to improve knee motion. Often times heat used for twenty to thirty minutes before working out will loosen up your tissues and help with improving the range of motion but do not use heat for the first two weeks following surgery. These exercises can be done on a training (exercise) mat, on the floor, on a table or on a bed. Use what ever works the best and is most comfortable for you Knee exercises include:  Leg Lifts - While your knee is still immobilized in a splint or cast, you can do straight leg raises. Lift the leg to 60 degrees, hold for 3 sec, and slowly lower the leg. Repeat 10-20 times 2-3 times daily. Perform this exercise against resistance later as your knee gets better.  Quad and Hamstring Sets - Tighten up the muscle on the front of the thigh (Quad) and hold for 5-10 sec. Repeat this 10-20 times hourly. Hamstring sets are done by pushing the foot backward against an object and holding for 5-10 sec. Repeat as with quad sets.  Leg Slides: Lying on your back, slowly slide your foot toward your buttocks, bending your knee up off the floor (only go as far as is comfortable). Then slowly slide your foot back down until your leg is flat on the floor again. Angel Wings: Lying on your back spread your legs to the side as far apart as you can without causing discomfort.  A rehabilitation program following serious knee injuries can speed recovery and prevent re-injury in the future due to weakened muscles. Contact your doctor or a physical therapist for more information on knee rehabilitation.   POST-OPERATIVE OPIOID TAPER INSTRUCTIONS: It is important to wean off of your opioid medication as soon as possible. If you do not need pain medication after your surgery it is ok to stop day one. Opioids include: Codeine, Hydrocodone(Norco, Vicodin), Oxycodone(Percocet, oxycontin) and  hydromorphone amongst others.  Long term and even short term use of opiods can cause: Increased pain response Dependence Constipation Depression Respiratory depression And more.  Withdrawal symptoms can include Flu like symptoms Nausea, vomiting And more Techniques to manage these symptoms Hydrate well Eat regular healthy meals Stay active Use relaxation techniques(deep breathing, meditating, yoga) Do Not substitute Alcohol to help with tapering If you have been on opioids for less than two weeks and  do not have pain than it is ok to stop all together.  Plan to wean off of opioids This plan should start within one week post op of your joint replacement. Maintain the same interval or time between taking each dose and first decrease the dose.  Cut the total daily intake of opioids by one tablet each day Next start to increase the time between doses. The last dose that should be eliminated is the evening dose.   IF YOU ARE TRANSFERRED TO A SKILLED REHAB FACILITY If the patient is transferred to a skilled rehab facility following release from the hospital, a list of the current medications will be sent to the facility for the patient to continue.  When discharged from the skilled rehab facility, please have the facility set up the patient's Home Health Physical Therapy prior to being released. Also, the skilled facility will be responsible for providing the patient with their medications at time of release from the facility to include their pain medication, the muscle relaxants, and their blood thinner medication. If the patient is still at the rehab facility at time of the two week follow up appointment, the skilled rehab facility will also need to assist the patient in arranging follow up appointment in our office and any transportation needs.  MAKE SURE YOU:  Understand these instructions.  Get help right away if you are not doing well or get worse.   DENTAL ANTIBIOTICS:  In most  cases prophylactic antibiotics for Dental procdeures after total joint surgery are not necessary.  Exceptions are as follows:  1. History of prior total joint infection  2. Severely immunocompromised (Organ Transplant, cancer chemotherapy, Rheumatoid biologic meds such as Humera)  3. Poorly controlled diabetes (A1C &gt; 8.0, blood glucose over 200)  If you have one of these conditions, contact your surgeon for an antibiotic prescription, prior to your dental procedure.    Pick up stool softner and laxative for home use following surgery while on pain medications. Do not submerge incision under water. Please use good hand washing techniques while changing dressing each day. May shower starting three days after surgery. Please use a clean towel to pat the incision dry following showers. Continue to use ice for pain and swelling after surgery. Do not use any lotions or creams on the incision until instructed by your surgeon.

## 2023-02-21 ENCOUNTER — Encounter (HOSPITAL_COMMUNITY): Payer: Self-pay | Admitting: Orthopedic Surgery

## 2023-02-21 DIAGNOSIS — M1711 Unilateral primary osteoarthritis, right knee: Secondary | ICD-10-CM | POA: Diagnosis not present

## 2023-02-21 LAB — BASIC METABOLIC PANEL
Anion gap: 8 (ref 5–15)
BUN: 14 mg/dL (ref 8–23)
CO2: 25 mmol/L (ref 22–32)
Calcium: 8.4 mg/dL — ABNORMAL LOW (ref 8.9–10.3)
Chloride: 103 mmol/L (ref 98–111)
Creatinine, Ser: 0.85 mg/dL (ref 0.61–1.24)
GFR, Estimated: >60 mL/min (ref >60)
Glucose, Bld: 188 mg/dL — ABNORMAL HIGH (ref 70–99)
Potassium: 4.1 mmol/L (ref 3.5–5.1)
Sodium: 136 mmol/L (ref 135–145)

## 2023-02-21 LAB — CBC
HCT: 35.7 % — ABNORMAL LOW (ref 39.0–52.0)
Hemoglobin: 12 g/dL — ABNORMAL LOW (ref 13.0–17.0)
MCH: 31.5 pg (ref 26.0–34.0)
MCHC: 33.6 g/dL (ref 30.0–36.0)
MCV: 93.7 fL (ref 80.0–100.0)
Platelets: 151 10*3/uL (ref 150–400)
RBC: 3.81 MIL/uL — ABNORMAL LOW (ref 4.22–5.81)
RDW: 13.1 % (ref 11.5–15.5)
WBC: 13.5 10*3/uL — ABNORMAL HIGH (ref 4.0–10.5)
nRBC: 0 % (ref 0.0–0.2)

## 2023-02-21 LAB — GLUCOSE, CAPILLARY
Glucose-Capillary: 163 mg/dL — ABNORMAL HIGH (ref 70–99)
Glucose-Capillary: 140 mg/dL — ABNORMAL HIGH (ref 70–99)

## 2023-02-21 MED ORDER — HYDROMORPHONE HCL 2 MG PO TABS: 1.0000 mg | ORAL_TABLET | Freq: Four times a day (QID) | ORAL | 0 refills | Status: DC | PRN

## 2023-02-21 MED ORDER — ASPIRIN 81 MG PO CHEW: 81.0000 mg | CHEWABLE_TABLET | Freq: Two times a day (BID) | ORAL | 0 refills | Status: AC

## 2023-02-21 MED ORDER — METHOCARBAMOL 500 MG PO TABS: 500.0000 mg | ORAL_TABLET | Freq: Four times a day (QID) | ORAL | 0 refills | Status: DC | PRN

## 2023-02-21 MED ORDER — PROMETHAZINE HCL 12.5 MG PO TABS: 12.5000 mg | ORAL_TABLET | Freq: Four times a day (QID) | ORAL | 0 refills | Status: DC | PRN

## 2023-02-21 MED ORDER — ORAL CARE MOUTH RINSE: 15.0000 mL | OROMUCOSAL | Status: DC | PRN

## 2023-02-21 MED ORDER — ASPIRIN 81 MG PO CHEW: 81.0000 mg | CHEWABLE_TABLET | Freq: Two times a day (BID) | ORAL | 0 refills | Status: DC

## 2023-02-21 MED ORDER — HYDROMORPHONE HCL 2 MG PO TABS: 1.0000 mg | ORAL_TABLET | Freq: Three times a day (TID) | ORAL | 0 refills | Status: DC | PRN

## 2023-02-21 NOTE — TOC Transition Note (Signed)
Transition of Care [facility]) - CM/SW Discharge Note   Patient Details  Name: Robert [name] MRN: [id number] Date of Birth: 07-15-1946  Transition of Care [facility]) CM/SW Contact:  Amada Jupiter, LCSW Phone Number: 02/21/2023, 10:31 AM   Clinical Narrative:     Met with pt and confirming he has needed DME at home.  OPPT already arranged with [facility]).  No TOC needs.  Final next level of care: OP Rehab Barriers to Discharge: No Barriers Identified   Patient Goals and CMS Choice      Discharge Placement                         Discharge Plan and Services Additional resources added to the After Visit Summary for                  DME Arranged: N/A DME Agency: NA                  Social Determinants of Health (SDOH) Interventions SDOH Screenings   Food Insecurity: No Food Insecurity (02/20/2023)  Housing: Low Risk  (02/20/2023)  Transportation Needs: No Transportation Needs (02/20/2023)  Utilities: Not At Risk (02/20/2023)  Depression (PHQ2-9): Low Risk  (01/30/2023)  Financial Resource Strain: Low Risk  (09/15/2022)  Physical Activity: Inactive (09/15/2022)  Social Connections: Moderately Integrated (09/15/2022)  Stress: No Stress Concern Present (09/15/2022)  Tobacco Use: Medium Risk (02/20/2023)     Readmission Risk Interventions     No data to display

## 2023-02-21 NOTE — Progress Notes (Signed)
Subjective: 1 Day Post-Op Procedure(s) (LRB): TOTAL KNEE ARTHROPLASTY (Right) Patient reports pain as mild.   Patient seen in rounds by Dr. Lequita Halt. Patient is well, and has had no acute complaints or problems No issues overnight. Denies chest pain, SOB, or calf pain. Foley catheter removed this AM.  We will begin therapy today.  Objective: Vital signs in last 24 hours: Temp:  [97.7 F (36.5 C)-98.3 F (36.8 C)] 97.8 F (36.6 C) (04/09 0541) Pulse Rate:  [41-65] 51 (04/09 0541) Resp:  [11-18] 17 (04/09 0541) BP: (105-136)/(47-74) 126/56 (04/09 0541) SpO2:  [95 %-99 %] 98 % (04/09 0541) Weight:  [93.4 kg] 93.4 kg (04/08 1330)  Intake/Output from previous day:  Intake/Output Summary (Last 24 hours) at 02/21/2023 0806 Last data filed at 02/21/2023 4580 Gross per 24 hour  Intake 2802.5 ml  Output 3070 ml  Net -267.5 ml     Intake/Output this shift: No intake/output data recorded.  Labs: Recent Labs    02/21/23 0339  HGB 12.0*   Recent Labs    02/21/23 0339  WBC 13.5*  RBC 3.81*  HCT 35.7*  PLT 151   Recent Labs    02/21/23 0339  NA 136  K 4.1  CL 103  CO2 25  BUN 14  CREATININE 0.85  GLUCOSE 188*  CALCIUM 8.4*   No results for input(s): "LABPT", "INR" in the last 72 hours.  Exam: General - Patient is Alert and Oriented Extremity - Neurologically intact Neurovascular intact Sensation intact distally Dorsiflexion/Plantar flexion intact Dressing - dressing C/D/I Motor Function - intact, moving foot and toes well on exam.   Past Medical History:  Diagnosis Date   Arthritis    CAD (coronary artery disease) 10/10/2011   NSTEMI with DES to the proximal RCA following flow wire evaluation. EF is normal.    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   Weight as of this encounter: 93.4 kg. Advance diet Up with therapy D/C IV fluids   Patient's anticipated LOS is less than 2 midnights, meeting these requirements: - Lives within 1 hour of care - Has a competent adult at home to recover with post-op recover - NO history of  - Heart failure  - Heart attack  - DVT/VTE  - Cardiac arrhythmia  - Respiratory Failure/COPD  - Renal failure  - Anemia  - Advanced Liver disease  DVT Prophylaxis - Aspirin Weight bearing as tolerated. Continue therapy.  Plan is to go Home after hospital stay. Plan for discharge later today if progresses with therapy and meeting goals. Scheduled for OPPT at [facility]). Follow-up in the  office in 2 weeks.  The PDMP database was reviewed today prior to any opioid medications being prescribed to this patient.  Arther Abbott, PA-C Orthopedic Surgery [phone] 02/21/2023, 8:06 AM

## 2023-02-21 NOTE — Progress Notes (Signed)
Physical Therapy Treatment Patient Details Name: MY SIDEL MRN: [id number] DOB: Apr 04, 1946 Today's Date: 02/21/2023   History of Present Illness 77 y.o. male admitted 02/20/23 for R TKA. PMH: OA, HTN, DM2, sleep apnea, L RCR, L THA 2022, cervical disc surgery    PT Comments    Pt ambulated 130' with RW, stair training completed. Pt is ready to DC home from a PT standpoint.    Recommendations for follow up therapy are one component of a multi-disciplinary discharge planning process, led by the attending physician.  Recommendations may be updated based on patient status, additional functional criteria and insurance authorization.  Follow Up Recommendations       Assistance Recommended at Discharge Intermittent Supervision/Assistance  Patient can return home with the following A little help with bathing/dressing/bathroom;Assistance with cooking/housework;Assist for transportation;Help with stairs or ramp for entrance   Equipment Recommendations  None recommended by PT    Recommendations for Other Services       Precautions / Restrictions Precautions Precautions: Knee;Fall Precaution Booklet Issued: Yes (comment) Precaution Comments: reviewed no pillow under R knee Required Braces or Orthoses: Knee Immobilizer - Right Knee Immobilizer - Right: On when out of bed or walking;Discontinue once straight leg raise with < 10 degree lag (R knee was buckling with weight bearing so applied KI) Restrictions Weight Bearing Restrictions: No Other Position/Activity Restrictions: WBAT     Mobility  Bed Mobility Overal bed mobility: Modified Independent             General bed mobility comments: HOB up, used rail    Transfers Overall transfer level: Needs assistance Equipment used: Rolling walker (2 wheels) Transfers: Sit to/from Stand Sit to Stand: Min guard, From elevated surface, Supervision           General transfer comment: VCs hand placement     Ambulation/Gait Ambulation/Gait assistance: Supervision Gait Distance (Feet): 130 Feet Assistive device: Rolling walker (2 wheels) Gait Pattern/deviations: Step-to pattern, Decreased step length - right, Decreased step length - left, Knees buckling Gait velocity: decr     General Gait Details: R knee buckled so applied R KI, VCs sequencing, distance limited by pain/fatigue   Stairs Stairs: Yes Stairs assistance: Min assist Stair Management: No rails, Forwards, Step to pattern, With walker Number of Stairs: 1 General stair comments: VCs sequencing, min A to stabilize RW   Wheelchair Mobility    Modified Rankin (Stroke Patients Only)       Balance Overall balance assessment: Modified Independent                                          Cognition Arousal/Alertness: Awake/alert Behavior During Therapy: WFL for tasks assessed/performed Overall Cognitive Status: Within Functional Limits for tasks assessed                                          Exercises     General Comments        Pertinent Vitals/Pain Pain Assessment Pain Assessment: 0-10 Pain Score: 6  Pain Location: R knee Pain Descriptors / Indicators: Sore Pain Intervention(s): Limited activity within patient's tolerance, Monitored during session, Premedicated before session, Ice applied    Home Living  Prior Function            PT Goals (current goals can now be found in the care plan section) Acute Rehab PT Goals Patient Stated Goal: golf, yardwork PT Goal Formulation: With patient Time For Goal Achievement: 02/28/23 Potential to Achieve Goals: Good Progress towards PT goals: Progressing toward goals    Frequency    7X/week      PT Plan      Co-evaluation              AM-PAC PT "6 Clicks" Mobility   Outcome Measure  Help needed turning from your back to your side while in a flat bed without using  bedrails?: None Help needed moving from lying on your back to sitting on the side of a flat bed without using bedrails?: A Little Help needed moving to and from a bed to a chair (including a wheelchair)?: A Little Help needed standing up from a chair using your arms (e.g., wheelchair or bedside chair)?: A Little Help needed to walk in hospital room?: A Little Help needed climbing 3-5 steps with a railing? : A Little 6 Click Score: 19    End of Session Equipment Utilized During Treatment: Gait belt;Right knee immobilizer Activity Tolerance: Patient tolerated treatment well Patient left: in chair;with call bell/phone within reach;with chair alarm set Nurse Communication: Mobility status PT Visit Diagnosis: Muscle weakness (generalized) (M62.81);Difficulty in walking, not elsewhere classified (R26.2);Pain Pain - Right/Left: Right Pain - part of body: Knee     Time: 1211-1227 PT Time Calculation (min) (ACUTE ONLY): 16 min  Charges:  $Gait Training: 8-22 mins       Ralene Bathe Kistler PT 02/21/2023  Acute Rehabilitation Services  Office [phone]

## 2023-02-21 NOTE — Evaluation (Signed)
Physical Therapy Evaluation Patient Details Name: Robert [name] MRN: [id number] DOB: Nov 29, 1945 Today's Date: 02/21/2023  History of Present Illness  77 y.o. male admitted 02/20/23 for R TKA. PMH: OA, HTN, DM2, sleep apnea, L RCR, L THA 2022, cervical disc surgery  Clinical Impression  Pt is s/p TKA resulting in the deficits listed below (see PT Problem List). Pt ambulated 6' with RW and R KI (R knee buckled without KI). Initiated TKA HEP. Will plan to do stair training this afternoon. Good progress expected.  Pt will benefit from acute skilled PT to increase their independence and safety with mobility to allow discharge.         Recommendations for follow up therapy are one component of a multi-disciplinary discharge planning process, led by the attending physician.  Recommendations may be updated based on patient status, additional functional criteria and insurance authorization.  Follow Up Recommendations       Assistance Recommended at Discharge Intermittent Supervision/Assistance  Patient can return home with the following  A little help with bathing/dressing/bathroom;Assistance with cooking/housework;Assist for transportation;Help with stairs or ramp for entrance    Equipment Recommendations None recommended by PT  Recommendations for Other Services       Functional Status Assessment Patient has had a recent decline in their functional status and demonstrates the ability to make significant improvements in function in a reasonable and predictable amount of time.     Precautions / Restrictions Precautions Precautions: Knee;Fall Precaution Booklet Issued: Yes (comment) Precaution Comments: reviewed no pillow under R knee Required Braces or Orthoses: Knee Immobilizer - Right Knee Immobilizer - Right: On when out of bed or walking;Discontinue once straight leg raise with < 10 degree lag (R knee was buckling with weight bearing so applied KI) Restrictions Weight Bearing  Restrictions: No Other Position/Activity Restrictions: WBAT      Mobility  Bed Mobility Overal bed mobility: Modified Independent             General bed mobility comments: HOB up, used rail    Transfers Overall transfer level: Needs assistance Equipment used: Rolling walker (2 wheels) Transfers: Sit to/from Stand Sit to Stand: Min guard, From elevated surface           General transfer comment: VCs hand placement    Ambulation/Gait Ambulation/Gait assistance: Min guard Gait Distance (Feet): 60 Feet Assistive device: Rolling walker (2 wheels) Gait Pattern/deviations: Step-to pattern, Decreased step length - right, Decreased step length - left, Knees buckling Gait velocity: decr     General Gait Details: R knee buckled so applied R KI, VCs sequencing, distance limited by pain/fatigue  Stairs            Wheelchair Mobility    Modified Rankin (Stroke Patients Only)       Balance Overall balance assessment: Modified Independent                                           Pertinent Vitals/Pain Pain Assessment Pain Assessment: 0-10 Pain Score: 8  Pain Location: R knee Pain Descriptors / Indicators: Sore Pain Intervention(s): Limited activity within patient's tolerance, Monitored during session, Premedicated before session, Ice applied    Home Living Family/patient expects to be discharged to:: Private residence Living Arrangements: Spouse/significant other Available Help at Discharge: Family;Available 24 hours/day Type of Home: House Home Access: Stairs to enter   [employer] of Steps: 1 Alternate  Level Stairs-Number of Steps[facility] Home Layout: Two level;Able to live on main level with bedroom/bathroom Home Equipment: Rolling Walker (2 wheels);Cane - single point Additional Comments: wife and daughter to assist at home    Prior Function Prior Level of Function : Independent/Modified Independent              Mobility Comments: denies falls in past 6 months       Hand Dominance        Extremity/Trunk Assessment   Upper Extremity Assessment Upper Extremity Assessment: Overall WFL for tasks assessed    Lower Extremity Assessment Lower Extremity Assessment: RLE deficits/detail RLE Deficits / Details: SLR 2/5, knee ext +2/5, AAROM ~5-70* R knee RLE Sensation: WNL RLE Coordination: WNL    Cervical / Trunk Assessment Cervical / Trunk Assessment: Normal  Communication      Cognition Arousal/Alertness: Awake/alert Behavior During Therapy: WFL for tasks assessed/performed Overall Cognitive Status: Within Functional Limits for tasks assessed                                          General Comments      Exercises Total Joint Exercises Ankle Circles/Pumps: AROM, Both, 10 reps, Supine Quad Sets: AROM, Both, 5 reps, Supine Short Arc Quad: AAROM, Right, 5 reps, Supine Heel Slides: AAROM, Right, 5 reps, Supine Hip ABduction/ADduction: AAROM, Right, 10 reps, Supine Straight Leg Raises: AAROM, Right, 5 reps, Supine Long Arc Quad: AAROM, Right, Seated, 10 reps Knee Flexion: AAROM, Right, 10 reps, Seated Goniometric ROM: ~5-70* AAROM R knee   Assessment/Plan    PT Assessment Patient needs continued PT services  PT Problem List Decreased range of motion;Decreased strength;Decreased mobility;Decreased activity tolerance;Pain       PT Treatment Interventions Gait training;DME instruction;Neuromuscular re-education;Stair training;Therapeutic exercise;Patient/family education    PT Goals (Current goals can be found in the Care Plan section)  Acute Rehab PT Goals Patient Stated Goal: golf, yardwork PT Goal Formulation: With patient Time For Goal Achievement: 02/28/23 Potential to Achieve Goals: Good    Frequency 7X/week     Co-evaluation               AM-PAC PT "6 Clicks" Mobility  Outcome Measure Help needed turning from your back to your  side while in a flat bed without using bedrails?: None Help needed moving from lying on your back to sitting on the side of a flat bed without using bedrails?: A Little Help needed moving to and from a bed to a chair (including a wheelchair)?: A Little Help needed standing up from a chair using your arms (e.g., wheelchair or bedside chair)?: A Little Help needed to walk in hospital room?: A Little Help needed climbing 3-5 steps with a railing? : A Lot 6 Click Score: 18    End of Session Equipment Utilized During Treatment: Gait belt;Right knee immobilizer Activity Tolerance: Patient tolerated treatment well Patient left: in chair;with call bell/phone within reach;with chair alarm set Nurse Communication: Mobility status PT Visit Diagnosis: Muscle weakness (generalized) (M62.81);Difficulty in walking, not elsewhere classified (R26.2);Pain Pain - Right/Left: Right Pain - part of body: Knee    Time: [id number] PT Time Calculation (min) (ACUTE ONLY): 42 min   Charges:   PT Evaluation $PT Eval Moderate Complexity: 1 Mod PT Treatments $Gait Training: 8-22 mins $Therapeutic Exercise: 8-22 mins        Ralene BatheUhlenberg, Shyloh Derosa  [facility] 02/21/2023  Acute Rehabilitation Services  Office [phone]

## 2023-02-22 ENCOUNTER — Telehealth: Payer: Self-pay

## 2023-02-22 NOTE — Transitions of Care (Post Inpatient/ED Visit) (Signed)
   02/22/2023  Name: Robert [name] MRN: [id number] DOB: [birth year]  Today's TOC FU Call Status: Today's TOC FU Call Status:: Unsuccessul Call (1st Attempt) Unsuccessful Call (1st Attempt) Date: 02/22/23  Attempted to reach the patient regarding the most recent Inpatient/ED visit.  Follow Up Plan: Additional outreach attempts will be made to reach the patient to complete the Transitions of Care (Post Inpatient/ED visit) call.   Agnes Lawrence, CMA (AAMA)  [facility] [phone]

## 2023-02-22 NOTE — Discharge Summary (Signed)
Patient ID: Robert [name] MRN: [id number] DOB/AGE: 07/18/1946 77 y.o.  Admit date: 02/20/2023 Discharge date: 02/21/2023  Admission Diagnoses:  Principal Problem:   OA (osteoarthritis) of knee Active Problems:   Primary osteoarthritis of right knee   Discharge Diagnoses:  Same  Past Medical History:  Diagnosis Date   Arthritis    CAD (coronary artery disease) 10/10/2011   NSTEMI with DES to the proximal RCA following flow wire evaluation. EF is normal.    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 male who was admitted 02/20/2023 for operative treatment ofOA (osteoarthritis) of knee. Patient has severe unremitting pain that affects sleep, daily activities, and work/hobbies. After pre-op clearance the patient was taken to the operating room on 02/20/2023 and underwent  Procedure(s): TOTAL KNEE ARTHROPLASTY.    Patient was given perioperative antibiotics:  Anti-infectives (From admission, onward)    Start     Dose/Rate Route Frequency Ordered Stop   02/20/23 2100  ceFAZolin (ANCEF) IVPB 2g/100 mL premix        2 g 200 mL/hr over 30 Minutes Intravenous Every 6 hours 02/20/23 1757 02/21/23 0653   02/20/23 1315  ceFAZolin (ANCEF) IVPB 2g/100 mL premix        2 g 200 mL/hr over 30 Minutes Intravenous On call to O.R. 02/20/23 1303 02/20/23 1507        Patient was given sequential compression  devices, early ambulation, and chemoprophylaxis to prevent DVT.  Patient benefited maximally from hospital stay and there were no complications.    Recent vital signs: No data found.   Recent laboratory studies:  Recent Labs    02/21/23 0339  WBC 13.5*  HGB 12.0*  HCT 35.7*  PLT 151  NA 136  K 4.1  CL 103  CO2 25  BUN 14  CREATININE 0.85  GLUCOSE 188*  CALCIUM 8.4*     Discharge Medications:   Allergies as of 02/21/2023       Reactions   Sumatriptan Shortness Of Breath   REACTION: sob, headache scalp sensitivity   Penicillins Itching   Tolerated Cephalosporin Date: 02/21/23.   Lipitor [atorvastatin] Nausea And Vomiting   Oxycodone Itching   Simbrinza [brinzolamide-brimonidine]    Itching of eye        Medication List     STOP taking these medications    aspirin EC 81 MG tablet Replaced by: aspirin 81 MG chewable tablet       TAKE these medications    acetaminophen 500 MG tablet Commonly known as: TYLENOL Take 1,000 mg by mouth every 6 (six) hours as needed for moderate pain.   Ajovy 225 MG/1.5ML Soaj Generic drug: Fremanezumab-vfrm Inject 225 mg into the skin every 30 (thirty) days.   aspirin 81 MG chewable tablet Chew 1  tablet (81 mg total) by mouth 2 (two) times daily for 21 days. Then resume one 81 mg aspirin once a day. Replaces: aspirin EC 81 MG tablet   azelastine 0.1 % nasal spray Commonly known as: ASTELIN Place 1 spray into both nostrils daily as needed for rhinitis.   brimonidine 0.2 % ophthalmic solution Commonly known as: ALPHAGAN Place 1 drop into the right eye in the morning and at bedtime.   ciprofloxacin 500 MG tablet Commonly known as: Cipro Take 1 tablet (500 mg total) by mouth 2 (two) times daily for 10 days.   dicyclomine 10 MG capsule Commonly known as: BENTYL Take 10 mg by mouth 3 (three) times daily as needed for spasms.   ezetimibe 10 MG tablet Commonly known as: ZETIA Take 5 mg by mouth daily.   fluticasone  50 MCG/ACT nasal spray Commonly known as: FLONASE Place 1 spray into both nostrils daily as needed for allergies.   HYDROmorphone 2 MG tablet Commonly known as: DILAUDID Take 0.5-1 tablets (1-2 mg total) by mouth every 8 (eight) hours as needed for moderate pain or severe pain.   latanoprost 0.005 % ophthalmic solution Commonly known as: XALATAN Place 1 drop into the right eye at bedtime.   metFORMIN 1000 MG tablet Commonly known as: GLUCOPHAGE Take 1 tablet (1,000 mg total) by mouth 2 (two) times daily with a meal.   methocarbamol 500 MG tablet Commonly known as: ROBAXIN Take 1 tablet (500 mg total) by mouth every 6 (six) hours as needed for muscle spasms.   metoprolol tartrate 25 MG tablet Commonly known as: LOPRESSOR Take 12.5 mg by mouth daily.   multivitamins ther. w/minerals Tabs tablet Take 1 tablet by mouth daily.   nitroGLYCERIN 0.4 MG SL tablet Commonly known as: NITROSTAT Place 0.4 mg under the tongue every 5 (five) minutes as needed for chest pain.   Nurtec 75 MG Tbdp Generic drug: Rimegepant Sulfate Take 75 mg by mouth daily as needed. For migraines. Take as close to onset of migraine as possible. One daily maximum.   pantoprazole 40 MG tablet Commonly known as: PROTONIX Take 40 mg by mouth daily.   PRESCRIPTION MEDICATION Pt has CPAP machine   promethazine 12.5 MG tablet Commonly known as: PHENERGAN Take 1 tablet (12.5 mg total) by mouth every 6 (six) hours as needed for nausea or vomiting.   rosuvastatin 40 MG tablet Commonly known as: CRESTOR Take 40 mg by mouth daily.   SUPER B COMPLEX PO Take 1 tablet by mouth daily.   tamsulosin 0.4 MG Caps capsule Commonly known as: FLOMAX TAKE 1 CAPSULE BY MOUTH EVERY DAY   traMADol 50 MG tablet Commonly known as: ULTRAM Take 100 mg by mouth every 6 (six) hours as needed (migraines).   vitamin C 1000 MG tablet Take 1,000 mg by mouth daily.   vitamin E 180 MG (400 UNITS) capsule Take 400 Units by  mouth daily.               Discharge Care Instructions  (From admission, onward)           Start     Ordered   02/21/23 0000  Weight bearing as tolerated        02/21/23 0809   02/21/23 0000  Change dressing       Comments: You may remove the bulky bandage (ACE wrap and gauze) two days after surgery. You will have an adhesive waterproof bandage underneath. Leave this in place until your first follow-up appointment.  02/21/23 0809            Diagnostic Studies: No results found.  Disposition: Discharge disposition: 01-Home or Self Care       Discharge Instructions     Call MD / Call 911   Complete by: As directed    If you experience chest pain or shortness of breath, CALL 911 and be transported to the hospital emergency room.  If you develope a fever above 101 F, pus (white drainage) or increased drainage or redness at the wound, or calf pain, call your surgeon's office.   Change dressing   Complete by: As directed    You may remove the bulky bandage (ACE wrap and gauze) two days after surgery. You will have an adhesive waterproof bandage underneath. Leave this in place until your first follow-up appointment.   Constipation Prevention   Complete by: As directed    Drink plenty of fluids.  Prune juice may be helpful.  You may use a stool softener, such as Colace (over the counter) 100 mg twice a day.  Use MiraLax (over the counter) for constipation as needed.   Diet - low sodium heart healthy   Complete by: As directed    Do not put a pillow under the knee. Place it under the heel.   Complete by: As directed    Driving restrictions   Complete by: As directed    No driving for two weeks   Post-operative opioid taper instructions:   Complete by: As directed    POST-OPERATIVE OPIOID TAPER INSTRUCTIONS: It is important to wean off of your opioid medication as soon as possible. If you do not need pain medication after your surgery it is ok to stop day  one. Opioids include: Codeine, Hydrocodone(Norco, Vicodin), Oxycodone(Percocet, oxycontin) and hydromorphone amongst others.  Long term and even short term use of opiods can cause: Increased pain response Dependence Constipation Depression Respiratory depression And more.  Withdrawal symptoms can include Flu like symptoms Nausea, vomiting And more Techniques to manage these symptoms Hydrate well Eat regular healthy meals Stay active Use relaxation techniques(deep breathing, meditating, yoga) Do Not substitute Alcohol to help with tapering If you have been on opioids for less than two weeks and do not have pain than it is ok to stop all together.  Plan to wean off of opioids This plan should start within one week post op of your joint replacement. Maintain the same interval or time between taking each dose and first decrease the dose.  Cut the total daily intake of opioids by one tablet each day Next start to increase the time between doses. The last dose that should be eliminated is the evening dose.      TED hose   Complete by: As directed    Use stockings (TED hose) for three weeks on both leg(s).  You may remove them at night for sleeping.   Weight bearing as tolerated   Complete by: As directed         Follow-up Information     Aluisio, Homero FellersFrank, MD. Schedule an appointment as soon as possible for a visit in 2 week(s).   Specialty: Orthopedic Surgery Contact information: [address] [place] [id number] [phone]                  Signed: Arther AbbottKristie Iyani Dresner 02/22/2023, 11:34 AM

## 2023-02-23 ENCOUNTER — Telehealth: Payer: Self-pay | Admitting: Neurology

## 2023-02-23 NOTE — Telephone Encounter (Signed)
Wife states re: the Fremanezumab-vfrm (AJOVY) 225 MG/1.5ML SOAJ , out of the 3 they will be 1 short because 1 did not take properly. Wife is asking if there is a sample available that pt can have for May 1st, please call.

## 2023-02-23 NOTE — Telephone Encounter (Signed)
Called pt wife per DPR. Stated she will pick sample up next week.

## 2023-03-04 ENCOUNTER — Emergency Department (HOSPITAL_BASED_OUTPATIENT_CLINIC_OR_DEPARTMENT_OTHER)
Admission: EM | Admit: 2023-03-04 | Discharge: 2023-03-04 | Disposition: A | Discharge status: Home / Self Care | Payer: Medicare Other | Attending: Emergency Medicine | Admitting: Emergency Medicine

## 2023-03-04 ENCOUNTER — Encounter (HOSPITAL_BASED_OUTPATIENT_CLINIC_OR_DEPARTMENT_OTHER): Payer: Self-pay | Admitting: Emergency Medicine

## 2023-03-04 ENCOUNTER — Emergency Department (HOSPITAL_BASED_OUTPATIENT_CLINIC_OR_DEPARTMENT_OTHER): Payer: Medicare Other

## 2023-03-04 ENCOUNTER — Other Ambulatory Visit: Payer: Self-pay

## 2023-03-04 ENCOUNTER — Emergency Department (HOSPITAL_BASED_OUTPATIENT_CLINIC_OR_DEPARTMENT_OTHER): Payer: Medicare Other | Admitting: Radiology

## 2023-03-04 DIAGNOSIS — Z79899 Other long term (current) drug therapy: Secondary | ICD-10-CM | POA: Insufficient documentation

## 2023-03-04 DIAGNOSIS — I2510 Atherosclerotic heart disease of native coronary artery without angina pectoris: Secondary | ICD-10-CM | POA: Insufficient documentation

## 2023-03-04 DIAGNOSIS — Z7982 Long term (current) use of aspirin: Secondary | ICD-10-CM | POA: Diagnosis not present

## 2023-03-04 DIAGNOSIS — Z7984 Long term (current) use of oral hypoglycemic drugs: Secondary | ICD-10-CM | POA: Insufficient documentation

## 2023-03-04 DIAGNOSIS — R531 Weakness: Secondary | ICD-10-CM | POA: Insufficient documentation

## 2023-03-04 DIAGNOSIS — R42 Dizziness and giddiness: Secondary | ICD-10-CM | POA: Insufficient documentation

## 2023-03-04 DIAGNOSIS — R519 Headache, unspecified: Secondary | ICD-10-CM

## 2023-03-04 DIAGNOSIS — E1165 Type 2 diabetes mellitus with hyperglycemia: Secondary | ICD-10-CM | POA: Diagnosis not present

## 2023-03-04 DIAGNOSIS — I10 Essential (primary) hypertension: Secondary | ICD-10-CM | POA: Insufficient documentation

## 2023-03-04 LAB — URINALYSIS, ROUTINE W REFLEX MICROSCOPIC
Bilirubin Urine: NEGATIVE
Glucose, UA: NEGATIVE mg/dL
Hgb urine dipstick: NEGATIVE
Ketones, ur: NEGATIVE mg/dL
Leukocytes,Ua: NEGATIVE
Nitrite: NEGATIVE
Protein, ur: NEGATIVE mg/dL
Specific Gravity, Urine: 1.011 (ref 1.005–1.030)
pH: 6.5 (ref 5.0–8.0)

## 2023-03-04 LAB — COMPREHENSIVE METABOLIC PANEL
ALT: 16 U/L (ref 0–44)
AST: 16 U/L (ref 15–41)
Albumin: 3.8 g/dL (ref 3.5–5.0)
Alkaline Phosphatase: 56 U/L (ref 38–126)
Anion gap: 10 (ref 5–15)
BUN: 13 mg/dL (ref 8–23)
CO2: 28 mmol/L (ref 22–32)
Calcium: 9.4 mg/dL (ref 8.9–10.3)
Chloride: 92 mmol/L — ABNORMAL LOW (ref 98–111)
Creatinine, Ser: 0.67 mg/dL (ref 0.61–1.24)
GFR, Estimated: >60 mL/min (ref >60)
Glucose, Bld: 156 mg/dL — ABNORMAL HIGH (ref 70–99)
Potassium: 4.3 mmol/L (ref 3.5–5.1)
Sodium: 130 mmol/L — ABNORMAL LOW (ref 135–145)
Total Bilirubin: 0.7 mg/dL (ref 0.3–1.2)
Total Protein: 6.7 g/dL (ref 6.5–8.1)

## 2023-03-04 LAB — CBC WITH DIFFERENTIAL/PLATELET
Abs Immature Granulocytes: 0.07 10*3/uL (ref 0.00–0.07)
Basophils Absolute: 0.1 10*3/uL (ref 0.0–0.1)
Basophils Relative: 1 %
Eosinophils Absolute: 0.1 10*3/uL (ref 0.0–0.5)
Eosinophils Relative: 1 %
HCT: 34.1 % — ABNORMAL LOW (ref 39.0–52.0)
Hemoglobin: 11.3 g/dL — ABNORMAL LOW (ref 13.0–17.0)
Immature Granulocytes: 1 %
Lymphocytes Relative: 19 %
Lymphs Abs: 1.9 10*3/uL (ref 0.7–4.0)
MCH: 31 pg (ref 26.0–34.0)
MCHC: 33.1 g/dL (ref 30.0–36.0)
MCV: 93.7 fL (ref 80.0–100.0)
Monocytes Absolute: 0.8 10*3/uL (ref 0.1–1.0)
Monocytes Relative: 8 %
Neutro Abs: 7.3 10*3/uL (ref 1.7–7.7)
Neutrophils Relative %: 70 %
Platelets: 372 10*3/uL (ref 150–400)
RBC: 3.64 MIL/uL — ABNORMAL LOW (ref 4.22–5.81)
RDW: 13.6 % (ref 11.5–15.5)
WBC: 10.1 10*3/uL (ref 4.0–10.5)
nRBC: 0 % (ref 0.0–0.2)

## 2023-03-04 LAB — TROPONIN I (HIGH SENSITIVITY)
Troponin I (High Sensitivity): 4 ng/L (ref <18)
Troponin I (High Sensitivity): 5 ng/L (ref <18)

## 2023-03-04 LAB — LACTIC ACID, PLASMA
Lactic Acid, Venous: 1.5 mmol/L (ref 0.5–1.9)
Lactic Acid, Venous: 0.9 mmol/L (ref 0.5–1.9)

## 2023-03-04 LAB — PROTIME-INR
INR: 1 (ref 0.8–1.2)
Prothrombin Time: 13.1 seconds (ref 11.4–15.2)

## 2023-03-04 MED ORDER — KETOROLAC TROMETHAMINE 30 MG/ML IJ SOLN
15.0000 mg | Freq: Once | INTRAMUSCULAR | Status: AC
  Administered 2023-03-04: 15 mg via INTRAVENOUS
  Filled 2023-03-04: qty 1

## 2023-03-04 MED ORDER — SODIUM CHLORIDE 0.9 % IV BOLUS
1000.0000 mL | Freq: Once | INTRAVENOUS | Status: AC
  Administered 2023-03-04: 1000 mL via INTRAVENOUS

## 2023-03-04 MED ORDER — ONDANSETRON HCL 4 MG/2ML IJ SOLN
4.0000 mg | Freq: Once | INTRAMUSCULAR | Status: AC
  Administered 2023-03-04: 4 mg via INTRAVENOUS
  Filled 2023-03-04: qty 2

## 2023-03-04 MED ORDER — IOHEXOL 350 MG/ML SOLN
100.0000 mL | Freq: Once | INTRAVENOUS | Status: AC | PRN
  Administered 2023-03-04: 75 mL via INTRAVENOUS

## 2023-03-04 NOTE — ED Provider Notes (Signed)
Blood pressure (!) 153/74, pulse 62, temperature 98.3 F (36.8 C), temperature source Oral, resp. rate 13, height  (1.854 m), weight 93.4 kg, SpO2 97 %.  Assuming care from Dr. Manus Gunning.  In short, Robert [name] is a 77 y.o. male with a chief complaint of Dizziness and Weakness .  Refer to the original H&P for additional details.  The current plan of care is to follow up on CTA head and remaining labs.  06:18 PM  Patient reassessed. HA much improved. Feeling well after IVF. Labs are reassuring including troponin. No UTI. Appears stable for discharge.       Maia Plan, MD 03/04/23 [phone]

## 2023-03-04 NOTE — ED Triage Notes (Signed)
Pt arrives to ED with c/o dizziness and weakness x1 week associated with intermittent headache.

## 2023-03-04 NOTE — ED Notes (Signed)
Pt had right TKA about 2 weeks ago, currently treating pain with tylenol and tramadol. Current pressure headache rated 6/10, all neuro function intact. Pt has had decreased appetite and energy x 4-6 weeks with 20 lb unintentional weight loss during that period of time. Occasional issues with equilibrium requiring extra time to change positions to avoid problems with balance and lightheadedness.

## 2023-03-04 NOTE — ED Notes (Signed)
Patient transported to CT 

## 2023-03-04 NOTE — ED Notes (Signed)
Pt ambulated with walker on room air; O2 sats maintained at 97% and greater throughout. Pt denies SOB and does not appear to be in distress. Returned to stretcher with both side rails in place due to age-related fall risk.

## 2023-03-04 NOTE — Discharge Instructions (Signed)
You were seen in the emergency department today with weakness.  Your symptoms have improved and labs and imaging are reassuring.  Please coordinate with your primary care doctor and cardiology teams on Monday for follow-up to discuss your ED visit. Return with any new or worsening symptoms.

## 2023-03-04 NOTE — ED Provider Notes (Signed)
EMERGENCY DEPARTMENT AT [facility] Provider Note   CSN: [id number] Arrival date & time: 03/04/23  1245     History  Chief Complaint  Patient presents with   Dizziness   Weakness    TYLIK TREESE is a 77 y.o. male.  Patient with a history of diabetes, sleep apnea, hypertension, CAD with stents, GERD presenting with generalized weakness and headache.  States he had a total knee replacement April 8 by Dr. Merlyn Albert.  He was doing well postoperatively for several days but has been feeling poorly for the past 4 to 5 days.  He describes a diffuse pounding headache that is gradual onset and persistently worsening.  He does have a history of migraines but this is different.  Denies thunderclap onset.  Associated with weakness and generalized shortness of breath with exertion.  No chest pain.  No cough or fever.  He believes he is doing well from a knee standpoint and has weaned himself off pain medication is now just taking tramadol.  He has follow-up with Dr. Merlyn Albert on April 22.  He has been feeling quite poorly with severe headache, decreased appetite, no energy for the past 4 days with unintentional weight loss and especially short of breath with exertion but not having any chest pain.  No recent black or bloody stools.  No nausea or vomiting.  No known fever.  Has noticed some warmth and swelling to his right knee but feels that it is improving.  The history is provided by the patient.  Dizziness Associated symptoms: headaches, shortness of breath and weakness   Associated symptoms: no chest pain, no nausea and no vomiting   Weakness Associated symptoms: arthralgias, dizziness, headaches, myalgias and shortness of breath   Associated symptoms: no abdominal pain, no chest pain, no cough, no dysuria, no fever, no nausea and no vomiting        Home Medications Prior to Admission medications   Medication Sig Start Date End Date Taking? Authorizing Provider  acetaminophen  (TYLENOL) 500 MG tablet Take 1,000 mg by mouth every 6 (six) hours as needed for moderate pain.    [provider]  Ascorbic Acid (VITAMIN C) 1000 MG tablet Take 1,000 mg by mouth daily.    [provider]  aspirin 81 MG chewable tablet Chew 1 tablet (81 mg total) by mouth 2 (two) times daily for 21 days. Then resume one 81 mg aspirin once a day. 02/21/23 03/14/23  Edmisten, Lyn Hollingshead, PA  azelastine (ASTELIN) 0.1 % nasal spray Place 1 spray into both nostrils daily as needed for rhinitis.    [provider]  B Complex-C (SUPER B COMPLEX PO) Take 1 tablet by mouth daily.    [provider]  brimonidine (ALPHAGAN) 0.2 % ophthalmic solution Place 1 drop into the right eye in the morning and at bedtime.    [provider]  dicyclomine (BENTYL) 10 MG capsule Take 10 mg by mouth 3 (three) times daily as needed for spasms.    [provider]  ezetimibe (ZETIA) 10 MG tablet Take 5 mg by mouth daily. 01/11/22   [provider]  fluticasone (FLONASE) 50 MCG/ACT nasal spray Place 1 spray into both nostrils daily as needed for allergies.    [provider]  Fremanezumab-vfrm (AJOVY) 225 MG/1.5ML SOAJ Inject 225 mg into the skin every 30 (thirty) days. 01/18/23   Glean Salvo, NP  HYDROmorphone (DILAUDID) 2 MG tablet Take 0.5-1 tablets (1-2 mg total) by mouth  every 8 (eight) hours as needed for moderate pain or severe pain. 02/21/23   Edmisten, Kristie L, PA  latanoprost (XALATAN) 0.005 % ophthalmic solution Place 1 drop into the right eye at bedtime.    [provider]  metFORMIN (GLUCOPHAGE) 1000 MG tablet Take 1 tablet (1,000 mg total) by mouth 2 (two) times daily with a meal. 02/01/23 07/31/23  Ardith Dark, MD  methocarbamol (ROBAXIN) 500 MG tablet Take 1 tablet (500 mg total) by mouth every 6 (six) hours as needed for muscle spasms. 02/21/23   Edmisten, Kristie L, PA  metoprolol tartrate (LOPRESSOR) 25 MG tablet Take 12.5 mg by mouth  daily.    [provider]  Multiple Vitamins-Minerals (MULTIVITAMINS THER. W/MINERALS) TABS Take 1 tablet by mouth daily.    [provider]  nitroGLYCERIN (NITROSTAT) 0.4 MG SL tablet Place 0.4 mg under the tongue every 5 (five) minutes as needed for chest pain.    [provider]  pantoprazole (PROTONIX) 40 MG tablet Take 40 mg by mouth daily. 01/28/21   [provider]  PRESCRIPTION MEDICATION Pt has CPAP machine    [provider]  promethazine (PHENERGAN) 12.5 MG tablet Take 1 tablet (12.5 mg total) by mouth every 6 (six) hours as needed for nausea or vomiting. 02/21/23   Edmisten, Kristie L, PA  Rimegepant Sulfate (NURTEC) 75 MG TBDP Take 75 mg by mouth daily as needed. For migraines. Take as close to onset of migraine as possible. One daily maximum. 11/30/22   Glean Salvo, NP  rosuvastatin (CRESTOR) 40 MG tablet Take 40 mg by mouth daily. 01/28/21   [provider]  tamsulosin (FLOMAX) 0.4 MG CAPS capsule TAKE 1 CAPSULE BY MOUTH EVERY DAY 03/07/22   Jarold Motto, PA  traMADol (ULTRAM) 50 MG tablet Take 100 mg by mouth every 6 (six) hours as needed (migraines). 01/11/22   [provider]  vitamin E 180 MG (400 UNITS) capsule Take 400 Units by mouth daily.    [provider]      Allergies    Sumatriptan, Penicillins, Lipitor [atorvastatin], Oxycodone, and Simbrinza [brinzolamide-brimonidine]    Review of Systems   Review of Systems  Constitutional:  Positive for activity change, appetite change and fatigue. Negative for fever.  HENT:  Negative for congestion and rhinorrhea.   Eyes:  Negative for visual disturbance.  Respiratory:  Positive for shortness of breath. Negative for cough and chest tightness.   Cardiovascular:  Positive for leg swelling. Negative for chest pain.  Gastrointestinal:  Negative for abdominal pain, nausea and vomiting.  Genitourinary:  Negative for dysuria, flank pain and hematuria.   Musculoskeletal:  Positive for arthralgias and myalgias.  Skin:  Positive for wound. Negative for rash.  Neurological:  Positive for dizziness, weakness, light-headedness and headaches.   all other systems are negative except as noted in the HPI and PMH.    Physical Exam Updated Vital Signs BP (!) 127/54 (BP Location: Left Arm)   Pulse 91   Temp 98.3 F (36.8 C) (Oral)   Resp 18   Ht  (1.854 m)   Wt 93.4 kg   SpO2 100%   BMI 27.18 kg/m  Physical Exam Vitals and nursing note reviewed.  Constitutional:      General: He is not in acute distress.    Appearance: Normal appearance. He is well-developed and normal weight. He is not ill-appearing.  HENT:     Head: Normocephalic and atraumatic.     Mouth/Throat:  Pharynx: No oropharyngeal exudate.  Eyes:     Conjunctiva/sclera: Conjunctivae normal.     Pupils: Pupils are equal, round, and reactive to light.  Neck:     Comments: No meningismus. Cardiovascular:     Rate and Rhythm: Normal rate and regular rhythm.     Heart sounds: Normal heart sounds. No murmur heard. Pulmonary:     Effort: Pulmonary effort is normal. No respiratory distress.     Breath sounds: Normal breath sounds.  Abdominal:     Palpations: Abdomen is soft.     Tenderness: There is no abdominal tenderness. There is no guarding or rebound.  Musculoskeletal:        General: Swelling and tenderness present. Normal range of motion.     Cervical back: Normal range of motion and neck supple.     Comments: Reduced range of motion of right knee.  Mild warmth and erythema.  No significant effusion.  Intact distal pulses.  Skin:    General: Skin is warm.  Neurological:     Mental Status: He is alert and oriented to person, place, and time.     Cranial Nerves: No cranial nerve deficit.     Motor: No abnormal muscle tone.     Coordination: Coordination normal.     Comments:  5/5 strength throughout. CN 2-12 intact.Equal grip strength.   Psychiatric:         Behavior: Behavior normal.     ED Results / Procedures / Treatments   Labs (all labs ordered are listed, but only abnormal results are displayed) Labs Reviewed  CBC WITH DIFFERENTIAL/PLATELET - Abnormal; Notable for the following components:      Result Value   RBC 3.64 (*)    Hemoglobin 11.3 (*)    HCT 34.1 (*)    All other components within normal limits  COMPREHENSIVE METABOLIC PANEL - Abnormal; Notable for the following components:   Sodium 130 (*)    Chloride 92 (*)    Glucose, Bld 156 (*)    All other components within normal limits  CULTURE, BLOOD (ROUTINE X 2)  CULTURE, BLOOD (ROUTINE X 2)  LACTIC ACID, PLASMA  URINALYSIS, ROUTINE W REFLEX MICROSCOPIC  PROTIME-INR  LACTIC ACID, PLASMA  TROPONIN I (HIGH SENSITIVITY)  TROPONIN I (HIGH SENSITIVITY)    EKG EKG Interpretation  Date/Time:  Saturday March 04 2023 13:08:26 EDT Ventricular Rate:  76 PR Interval:  158 QRS Duration: 98 QT Interval:  384 QTC Calculation: 432 R Axis:   74 Text Interpretation: Sinus rhythm No significant change was found Confirmed by Glynn Octave [phone]) on 03/04/2023 1:12:37 PM  Radiology DG Chest Port 1 View  Result Date: 03/04/2023 CLINICAL DATA:  Headache, dizziness, decreased appetite. EXAM: PORTABLE CHEST 1 VIEW COMPARISON:  Chest radiograph dated 11/04/2021. FINDINGS: The heart size and mediastinal contours are within normal limits. Vascular calcifications are seen in the aortic arch. Both lungs are clear. The visualized skeletal structures are unremarkable. IMPRESSION: No active disease. Electronically Signed   By: Romona Curls M.D.   On: 03/04/2023 14:36   DG Knee Complete 4 Views Right  Result Date: 03/04/2023 CLINICAL DATA:  Postop.  Pain EXAM: RIGHT KNEE - COMPLETE 4 VIEW COMPARISON:  None Available. FINDINGS: Changes of total knee arthroplasty. Press-Fit femoral component, cemented tibial component and patellar button. Expected alignment. No obvious hardware failure. No  fracture or dislocation. Small joint effusion on lateral view. Preserved bone mineralization. IMPRESSION: Previous total knee arthroplasty. No acute osseous abnormality. Small effusion. Electronically Signed  By: Karen Kays M.D.   On: 03/04/2023 14:36    Procedures Procedures    Medications Ordered in ED Medications  sodium chloride 0.9 % bolus 1,000 mL (has no administration in time range)  ondansetron (ZOFRAN) injection 4 mg (has no administration in time range)    ED Course/ Medical Decision Making/ A&P                             Medical Decision Making Amount and/or Complexity of Data Reviewed Labs: ordered. Decision-making details documented in ED Course. Radiology: ordered and independent interpretation performed. Decision-making details documented in ED Course. ECG/medicine tests: ordered and independent interpretation performed. Decision-making details documented in ED Course.  Risk Prescription drug management.  4 days of generalized weakness, gradual onset severe headache, decreased appetite, shortness of breath with exertion, anorexia and generalized weakness.  Neurological exam is nonfocal. IVF given.   Hemoglobin is stable with post op values. Creatinine is normal. Sodium mildly low at 130. CXR and Knee Xray are reassuring. Results reviewed and interpreted by me.  UA is negative. Hyperglycemia without evidence of DKA.  Unclear source of patient's feeling poorly. Maybe slightly dehydrated which may explain his headache as well. Given his headache is atypical for him and his migraines, will proceed with CTA imaging.   No fever. Low suspicion for septic joint. Likely expected post op soreness and swelling.    Additional IV fluids given. Second troponin pending. Low suspicion for ACS equivalent however.   CTA pending at shift change. Dr. Jacqulyn Bath to assume care. Anticipate reassessment after symptom control and imaging.         Final Clinical Impression(s) / ED  Diagnoses Final diagnoses:  None    Rx / DC Orders ED Discharge Orders     None         Keyonta Madrid, Jeannett Senior, MD 03/04/23 1652

## 2023-03-05 LAB — CULTURE, BLOOD (ROUTINE X 2)
Culture: NO GROWTH
Special Requests: ADEQUATE

## 2023-03-06 ENCOUNTER — Ambulatory Visit: Payer: Self-pay | Admitting: *Deleted

## 2023-03-06 LAB — CULTURE, BLOOD (ROUTINE X 2)

## 2023-03-06 NOTE — Chronic Care Management (AMB) (Signed)
   03/06/2023  Robert [name] 1946-10-27 [id number]  Status changed to previously enrolled.  [name] RNC, BSN RN Case Physicist, medical Healthcare at [facility] [phone]

## 2023-03-07 LAB — CULTURE, BLOOD (ROUTINE X 2): Culture: NO GROWTH

## 2023-03-09 LAB — CULTURE, BLOOD (ROUTINE X 2)

## 2023-03-11 NOTE — Progress Notes (Deleted)
   Cardiology Clinic Note   Date: 03/11/2023 ID: Robert [name], DOB 1946-09-30, MRN [id number]  Primary Cardiologist:  Peter Swaziland, MD  Patient Profile    Robert [name] is a 77 y.o. male who presents to the clinic today for ***  Past medical history significant for: CAD. LHC 12/11/2001 (abnormal stress test): Minimal irregularities in RCA with no obstructive CAD. LHC 10/13/2011 (NSTEMI): Proximal RCA 70%.  PCI with DES 3.5 x 20 mm to proximal RCA. Nuclear stress test 01/15/2014: Negative for pharmacologic stress-induced ischemia.  EF 57%. Echo 12/16/2021: EF 60 to 65%.  Mild LVH.  Trivial MR.  Aortic valve sclerosis/calcification without stenosis. Hypertension. Hyperlipidemia. Lipid panel 06/15/2022: LDL 75, HDL 59, TG 120, total 157. CVA. 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 MRI brain 12/20/2022: Remote left occipital cortical stroke, unchanged compared to the previous MRI.  Scattered T2/FLAIR hyperintense foci in the subcortical and deep white matter consistent with mild chronic microvascular ischemic change, unchanged compared to the previous MRI.  No acute findings. OSA. Esophageal ulcer. Avascular necrosis. Chronic migraines. T2DM.   History of Present Illness    Robert [name] is a longtime patient of cardiology. He is followed by Dr. Swaziland for the above outlined history.  Patient was last seen in the office by me on 02/09/2023 for preoperative cardiac risk assessment prior to right knee arthroplasty.  At that time he was doing well with no cardiac complaints or concerns.  Patient underwent knee surgery on 02/20/2023 with Dr. Lequita Halt.  Patient presented to the ED on 03/04/2023 with complaints of dizziness and weakness x 1 week associated with intermittent headaches.  Troponin negative x 2.  UA negative.  Stable hemoglobin and normal kidney function and electrolytes.  CTA head and neck without acute abnormalities.  Today, patient ***   Weakness.*** CAD.  S/p  PCI with DES to proximal RCA November 2012.  Nuclear stress test March 2015 negative.  Patient denies chest pain, pressure or tightness.***Continue aspirin, Zetia, rosuvastatin, metoprolol, as needed SL NTG. Hypertension.  BP today***. Patient denies headaches or dizziness.  Continue metoprolol. Hyperlipidemia.  LDL August 2023 75, not at goal.  Continue rosuvastatin and Zetia.  ROS: All other systems reviewed and are otherwise negative except as noted in History of Present Illness.  Studies Reviewed    ECG personally reviewed by me today: ***  No significant changes from ***  Risk Assessment/Calculations    {Does this patient have ATRIAL FIBRILLATION?:[phone]} No BP recorded.  {Refresh Note OR Click here to enter BP  :1}***        Physical Exam    VS:  There were no vitals taken for this visit. , BMI There is no height or weight on file to calculate BMI.  GEN: Well nourished, well developed, in no acute distress. Neck: No JVD or carotid bruits. Cardiac: *** RRR. No murmurs. No rubs or gallops.   Respiratory:  Respirations regular and unlabored. Clear to auscultation without rales, wheezing or rhonchi. GI: Soft, nontender, nondistended. Extremities: Radials/DP/PT 2+ and equal bilaterally. No clubbing or cyanosis. No edema ***  Skin: Warm and dry, no rash. Neuro: Strength intact.  Assessment & Plan   ***  Disposition: ***     {Are you ordering a CV Procedure (e.g. stress test, cath, DCCV, TEE, etc)?   Press F2        :[id number]}   Signed, Etta Grandchild. Raliyah Montella, DNP, NP-C

## 2023-03-13 ENCOUNTER — Encounter: Payer: Self-pay | Admitting: Student

## 2023-03-13 ENCOUNTER — Ambulatory Visit: Payer: Medicare Other | Attending: Student | Admitting: Student

## 2023-03-13 VITALS — BP 126/58 | HR 80 | Ht 73.0 in | Wt 197.0 lb

## 2023-03-13 DIAGNOSIS — R0609 Other forms of dyspnea: Secondary | ICD-10-CM

## 2023-03-13 DIAGNOSIS — I2510 Atherosclerotic heart disease of native coronary artery without angina pectoris: Secondary | ICD-10-CM

## 2023-03-13 DIAGNOSIS — R531 Weakness: Secondary | ICD-10-CM

## 2023-03-13 DIAGNOSIS — R42 Dizziness and giddiness: Secondary | ICD-10-CM | POA: Diagnosis not present

## 2023-03-13 DIAGNOSIS — I10 Essential (primary) hypertension: Secondary | ICD-10-CM

## 2023-03-13 NOTE — Patient Instructions (Signed)
Medication Instructions:  Your physician recommends that you continue on your current medications as directed. Please refer to the Current Medication list given to you today.  *If you need a refill on your cardiac medications before your next appointment, please call your pharmacy*   Lab Work: NONE If you have labs (blood work) drawn today and your tests are completely normal, you will receive your results only by: MyChart Message (if you have MyChart) OR A paper copy in the mail If you have any lab test that is abnormal or we need to change your treatment, we will call you to review the results.   Testing/Procedures: Your physician has requested that you have an echocardiogram. Echocardiography is a painless test that uses sound waves to create images of your heart. It provides your doctor with information about the size and shape of your heart and how well your heart's chambers and valves are working. This procedure takes approximately one hour. There are no restrictions for this procedure. Please do NOT wear cologne, perfume, aftershave, or lotions (deodorant is allowed). Please arrive 15 minutes prior to your appointment time.    Follow-Up: At [facility], you and your health needs are our priority.  As part of our continuing mission to provide you with exceptional heart care, we have created designated Provider Care Teams.  These Care Teams include your primary Cardiologist (physician) and Advanced Practice Providers (APPs -  Physician Assistants and Nurse Practitioners) who all work together to provide you with the care you need, when you need it.  We recommend signing up for the patient portal called "MyChart".  Sign up information is provided on this After Visit Summary.  MyChart is used to connect with patients for Virtual Visits (Telemedicine).  Patients are able to view lab/test results, encounter notes, upcoming appointments, etc.  Non-urgent messages can be sent to your  provider as well.   To learn more about what you can do with MyChart, go to [email or web address].    Your next appointment:   6-8 week(s) please make sure this appointment is scheduled after his ECHO   Provider:   Carlos Levering, NP

## 2023-03-13 NOTE — Progress Notes (Signed)
Cardiology Clinic Note   Date: 03/13/2023 ID: Robert [name], DOB 07/10/1946, MRN [id number]  Primary Cardiologist:  Peter Swaziland, MD  Patient Profile    Robert [name] is a 77 y.o. male who presents to the clinic today for concerns of dizziness, weakness and DOE.   Past medical history significant for: CAD. LHC 12/11/2001 (abnormal stress test): Minimal irregularities in RCA with no obstructive CAD. LHC 10/13/2011 (NSTEMI): Proximal RCA 70%.  PCI with DES 3.5 x 20 mm to proximal RCA. Nuclear stress test 01/15/2014: Negative for pharmacologic stress-induced ischemia.  EF 57%. Echo 12/16/2021: EF 60 to 65%.  Mild LVH.  Trivial MR.  Aortic valve sclerosis/calcification without stenosis. Hypertension. Hyperlipidemia. Lipid panel 06/15/2022: LDL 75, HDL 59, TG 120, total 157. CVA. 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 MRI brain 12/20/2022: Remote left occipital cortical stroke, unchanged compared to the previous MRI.  Scattered T2/FLAIR hyperintense foci in the subcortical and deep white matter consistent with mild chronic microvascular ischemic change, unchanged compared to the previous MRI.  No acute findings. OSA. Esophageal ulcer. Avascular necrosis. Chronic migraines. T2DM.   History of Present Illness    Robert [name] is a longtime patient of cardiology. He is followed by Dr. Swaziland for the above outlined history.  Patient was last seen in the office by me on 02/09/2023 for preoperative cardiac risk assessment prior to right knee arthroplasty.  At that time he was doing well with no cardiac complaints or concerns.  Patient underwent knee surgery on 02/20/2023 with Dr. Lequita Halt.  Patient presented to the ED on 03/04/2023 with complaints of dizziness and weakness x 1 week associated with intermittent headaches.  Troponin negative x 2.  UA negative.  Stable hemoglobin and normal kidney function and electrolytes.  CTA head and neck without acute abnormalities.  Today,  patient is accompanied by his wife.  He reports increased dizziness and weakness along with dyspnea on exertion for greater than a month.  Patient reports since knee surgery he has felt increasingly weak and dizzy particularly with position changes.  He is also experiencing dyspnea with a small amount of exertion.  However, his wife states this has been going on since prior to his knee surgery on 02/20/2023.  Patient suffered 1 fall without injury secondary to positional dizziness about 1 to 2 weeks ago.  No syncope.  Patient states he has not been eating that much secondary to decreased appetite but feels he has adequate hydration.  He denies chest pain, pressure, tightness.  Patient is attending physical therapy and tolerating it well, as they mainly do lying down and seated exercise.  He denies palpitations.    ROS: All other systems reviewed and are otherwise negative except as noted in History of Present Illness.  Studies Reviewed    ECG performed to the ER on 03/04/2023 personally reviewed by me today: NSR, 76 bpm.  No significant changes from 02/09/2023.         Physical Exam    VS:  BP (!) 126/58 (BP Location: Left Arm, Patient Position: Sitting, Cuff Size: Normal)   Pulse 80   Ht 6\' 1"  (1.854 m)   Wt 197 lb (89.4 kg)   BMI 25.99 kg/m  , BMI Body mass index is 25.99 kg/m.  Orthostatic VS for the past 24 hrs (Last 3 readings):  BP- Lying Pulse- Lying BP- Sitting Pulse- Sitting BP- Standing at 0 minutes Pulse- Standing at 0 minutes BP- Standing at 3 minutes Pulse- Standing at  3 minutes  03/13/23 1047 137/70 68 115/62 75 114/56 79 114/63 84    GEN: Well nourished, well developed, in no acute distress. Neck: No JVD or carotid bruits. Cardiac: RRR. No murmurs. No rubs or gallops.   Respiratory:  Respirations regular and unlabored. Clear to auscultation without rales, wheezing or rhonchi. GI: Soft, nontender, nondistended. Extremities: Radials/DP/PT 2+ and equal bilaterally. No clubbing  or cyanosis. No edema.  Skin: Warm and dry, no rash. Neuro: Strength intact.  Assessment & Plan    Weakness/dizziness/DOE.  Patient reports a greater than 1 month history of progressive weakness, dizziness, DOE.  He has been managing his symptoms with slow transitions and movements but finds he does develop some dizziness when he is walking a distance.  Orthostatic vitals today show >20 mmHg drop from lying to sitting but stable BP from sitting to standing and standing for 3 minutes.  Patient reports decreased appetite since surgery but states he is hydrating adequately.  He attends physical therapy for his knee and is tolerating it well, as they mainly do lying down and seated exercise.  Will get an echo for further evaluation.  No labs today as he had recent labs while in the ER on 03/04/2023. CAD.  S/p PCI with DES to proximal RCA November 2012.  Nuclear stress test March 2015 negative.  Patient denies chest pain, pressure or tightness. Continue aspirin, Zetia, rosuvastatin, metoprolol, as needed SL NTG. Hypertension.  BP today 126/58.  Patient reports positional dizziness as well as dizziness when ambulating.  >20 mmHg drop from lying to sitting but stable BP from sitting to standing and standing for 3 minutes.  Continue metoprolol. Hyperlipidemia.  LDL August 2023 75, not at goal.  Continue rosuvastatin and Zetia.  Disposition: Echo for DOE.  Return in 6 to 8 weeks or sooner as needed.         Signed, Etta Grandchild. Abraham Entwistle, DNP, NP-C

## 2023-03-15 NOTE — Transitions of Care (Post Inpatient/ED Visit) (Signed)
   03/15/2023  Name: Robert [name] MRN: [id number] DOB: [birth year]  Today's TOC FU Call Status: Today's TOC FU Call Status:: Unsuccessul Call (1st Attempt) Unsuccessful Call (1st Attempt) Date: 02/22/23  Attempted to reach the patient regarding the most recent Inpatient/ED visit.  Follow Up Plan: No further outreach attempts will be made at this time. We have been unable to contact the patient.  Signature   Agnes Lawrence, CMA (AAMA)  [facility] [phone]

## 2023-03-16 ENCOUNTER — Telehealth: Payer: Self-pay | Admitting: Student

## 2023-03-16 ENCOUNTER — Other Ambulatory Visit: Payer: Self-pay

## 2023-03-16 DIAGNOSIS — R531 Weakness: Secondary | ICD-10-CM

## 2023-03-16 DIAGNOSIS — R0609 Other forms of dyspnea: Secondary | ICD-10-CM

## 2023-03-16 DIAGNOSIS — R5383 Other fatigue: Secondary | ICD-10-CM

## 2023-03-16 NOTE — Telephone Encounter (Signed)
CTA has been ordered, Patient has been made aware of of the new testing that you want done and that [facility] will give him a call. He gave a verbal understanding.

## 2023-03-16 NOTE — Telephone Encounter (Signed)
Spoke with patient via telephone. He continues to have weakness and fatigue with small amount of exertion. Will order CTA Chest (PE protocol) to rule out PE. I have a low suspicion for this. Patient is in agreement with plan and will await scheduling.   Etta Grandchild. Zhion Pevehouse, DNP, NP-C  03/16/2023, 12:37 PM [facility] [address] Office [phone] Fax [phone]

## 2023-03-17 ENCOUNTER — Telehealth: Payer: Self-pay | Admitting: Student

## 2023-03-17 NOTE — Telephone Encounter (Signed)
Daughter states patient has cancelled all of his physical therapy appointments because he is afraid of how sort of breath he gets. She was wondering if we were able to schedule CT angio has urgent. She is aware that test still needs to be approved by insurance.

## 2023-03-17 NOTE — Telephone Encounter (Signed)
  Pt's daughter calling, they would like to request if the CT can be ordered as urgent. She said, pt cancelled all his upcoming PT and appointments because he is afraid on what's going on and would like to get the test as soon as possible

## 2023-03-19 ENCOUNTER — Other Ambulatory Visit: Payer: Self-pay

## 2023-03-19 ENCOUNTER — Emergency Department (HOSPITAL_BASED_OUTPATIENT_CLINIC_OR_DEPARTMENT_OTHER): Payer: Medicare Other

## 2023-03-19 ENCOUNTER — Emergency Department (HOSPITAL_BASED_OUTPATIENT_CLINIC_OR_DEPARTMENT_OTHER)
Admission: EM | Admit: 2023-03-19 | Discharge: 2023-03-19 | Disposition: A | Discharge status: Home / Self Care | Payer: Medicare Other | Attending: Emergency Medicine | Admitting: Emergency Medicine

## 2023-03-19 ENCOUNTER — Encounter (HOSPITAL_BASED_OUTPATIENT_CLINIC_OR_DEPARTMENT_OTHER): Payer: Self-pay | Admitting: Emergency Medicine

## 2023-03-19 ENCOUNTER — Telehealth: Payer: Self-pay | Admitting: Student

## 2023-03-19 DIAGNOSIS — I10 Essential (primary) hypertension: Secondary | ICD-10-CM | POA: Diagnosis not present

## 2023-03-19 DIAGNOSIS — M25469 Effusion, unspecified knee: Secondary | ICD-10-CM | POA: Diagnosis not present

## 2023-03-19 DIAGNOSIS — I2510 Atherosclerotic heart disease of native coronary artery without angina pectoris: Secondary | ICD-10-CM | POA: Insufficient documentation

## 2023-03-19 DIAGNOSIS — E119 Type 2 diabetes mellitus without complications: Secondary | ICD-10-CM | POA: Diagnosis not present

## 2023-03-19 DIAGNOSIS — R0602 Shortness of breath: Secondary | ICD-10-CM

## 2023-03-19 LAB — COMPREHENSIVE METABOLIC PANEL
ALT: 12 U/L (ref 0–44)
AST: 12 U/L — ABNORMAL LOW (ref 15–41)
Albumin: 3.8 g/dL (ref 3.5–5.0)
Alkaline Phosphatase: 66 U/L (ref 38–126)
Anion gap: 6 (ref 5–15)
BUN: 14 mg/dL (ref 8–23)
CO2: 28 mmol/L (ref 22–32)
Calcium: 9.1 mg/dL (ref 8.9–10.3)
Chloride: 96 mmol/L — ABNORMAL LOW (ref 98–111)
Creatinine, Ser: 0.82 mg/dL (ref 0.61–1.24)
GFR, Estimated: >60 mL/min (ref >60)
Glucose, Bld: 227 mg/dL — ABNORMAL HIGH (ref 70–99)
Potassium: 4 mmol/L (ref 3.5–5.1)
Sodium: 130 mmol/L — ABNORMAL LOW (ref 135–145)
Total Bilirubin: 0.3 mg/dL (ref 0.3–1.2)
Total Protein: 6.3 g/dL — ABNORMAL LOW (ref 6.5–8.1)

## 2023-03-19 LAB — CBC WITH DIFFERENTIAL/PLATELET
Abs Immature Granulocytes: 0.02 10*3/uL (ref 0.00–0.07)
Basophils Absolute: 0 10*3/uL (ref 0.0–0.1)
Basophils Relative: 1 %
Eosinophils Absolute: 0.1 10*3/uL (ref 0.0–0.5)
Eosinophils Relative: 2 %
HCT: 34.5 % — ABNORMAL LOW (ref 39.0–52.0)
Hemoglobin: 11.4 g/dL — ABNORMAL LOW (ref 13.0–17.0)
Immature Granulocytes: 0 %
Lymphocytes Relative: 26 %
Lymphs Abs: 1.8 10*3/uL (ref 0.7–4.0)
MCH: 31 pg (ref 26.0–34.0)
MCHC: 33 g/dL (ref 30.0–36.0)
MCV: 93.8 fL (ref 80.0–100.0)
Monocytes Absolute: 0.6 10*3/uL (ref 0.1–1.0)
Monocytes Relative: 9 %
Neutro Abs: 4.3 10*3/uL (ref 1.7–7.7)
Neutrophils Relative %: 62 %
Platelets: 198 10*3/uL (ref 150–400)
RBC: 3.68 MIL/uL — ABNORMAL LOW (ref 4.22–5.81)
RDW: 13.7 % (ref 11.5–15.5)
WBC: 6.9 10*3/uL (ref 4.0–10.5)
nRBC: 0 % (ref 0.0–0.2)

## 2023-03-19 LAB — BRAIN NATRIURETIC PEPTIDE: B Natriuretic Peptide: 34.9 pg/mL (ref 0.0–100.0)

## 2023-03-19 LAB — TROPONIN I (HIGH SENSITIVITY): Troponin I (High Sensitivity): 3 ng/L (ref <18)

## 2023-03-19 MED ORDER — IOHEXOL 350 MG/ML SOLN
100.0000 mL | Freq: Once | INTRAVENOUS | Status: AC | PRN
  Administered 2023-03-19: 80 mL via INTRAVENOUS

## 2023-03-19 NOTE — Discharge Instructions (Addendum)
We evaluated you for your shortness of breath.  Your workup in the emergency department was reassuring including normal cardiac enzymes and a normal CT scan of your chest.  We did not see any signs of heart failure or blood clot in your lungs.  We also did not see any signs of a collapsed lung or pneumonia.  Please continue to follow-up with cardiology for your symptoms.  Your symptoms could also be possibly related to high blood sugar.  Your blood sugar was elevated in the emergency department.  This can sometimes cause dehydration and lightheadedness with standing and fatigue.  Your primary doctor may be able to adjust your diabetes medications to help get this under better control.  If you have any new or worsening symptoms such as severe chest pain, difficulty breathing, fainting, productive cough, nausea or vomiting, or any other concerning symptoms, please return to the emergency department.

## 2023-03-19 NOTE — ED Triage Notes (Signed)
Pt arrives pov, slow gait with c/o shob, worsening with ambulation. Pt reports knee surgery 4 weeks pta. Pt denies CP, reports dizziness with deep inspiration

## 2023-03-19 NOTE — ED Notes (Signed)
Discharge paperwork given and verbally understood. 

## 2023-03-19 NOTE — Telephone Encounter (Signed)
Contacted patient via telephone to address daughter's concerns about getting CTA. Patient states he is still experiencing positional dizziness and dyspnea with exertion. He feels he may be worse than when he was in the office a week ago. Advised patient to go to the ER for further evaluation. He mentioned he may wait until tomorrow and was advised it would be best to not delay. He expressed understanding.   Etta Grandchild. Valen Gillison, DNP, NP-C  03/19/2023, 3:31 PM [facility] [address] Office [phone] Fax [phone]

## 2023-03-19 NOTE — ED Provider Notes (Signed)
[place] EMERGENCY DEPARTMENT AT [facility] Provider Note  CSN: [id number] Arrival date & time: 03/19/23 1600  Chief Complaint(s) Shortness of Breath  HPI Robert [name] is a 77 y.o. male with history of coronary artery disease, diabetes, hypertension, hyperlipidemia, recent knee replacement to the right knee presenting to the emergency department shortness of breath.  He reports shortness of breath around the past 4 weeks.  Reports fatigue and decreased appetite.  Denies any chest pain.  Reports occasional dizziness/lightheadedness when standing and with taking a deep breath.  No nausea, vomiting.  Had some right leg swelling after his surgery which has been improving.  Also had unintentional weight loss although has not been eating as much.     Past Medical History Past Medical History:  Diagnosis Date   Arthritis    CAD (coronary artery disease) 10/10/2011   NSTEMI with DES to the proximal RCA following flow wire evaluation. EF is normal.    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    "left"   Squamous cell carcinoma 12/17/2016   Stroke [facility])    found on MRI - pt unaware had stroke approx 7 years ago   Patient Active Problem List   Diagnosis Date Noted   Primary osteoarthritis of right knee 02/20/2023   OA (osteoarthritis) of knee 12/16/2022   Acute medial meniscus tear of right knee 05/30/2022   Chronic migraine without aura, with intractable migraine, so stated, with status migrainosus 09/20/2021   Status post total replacement of left hip 05/14/2021   Type 2 diabetes mellitus with hyperglycemia ([name]) 05/23/2018   Chronically dry  eyes, bilateral 05/23/2018   Migraines 11/28/2016   Hypertension associated with diabetes ([name]) 10/11/2011   History of basal cell carcinoma (BCC), right ear 03/03/2009   Hyperlipidemia associated with type 2 diabetes mellitus ([name]) 01/11/2008   Allergic rhinitis 01/07/2008   Prostatitis, recurrent 01/07/2008   History of gastric ulcer 01/07/2008   History of colon polyps, followed by Dr. Kinnie Scales 01/07/2008   GERD 04/02/2007   Sleep apnea 04/02/2007   Home Medication(s) Prior to Admission medications   Medication Sig Start Date End Date Taking? Authorizing Provider  acetaminophen (TYLENOL) 500 MG tablet Take 1,000 mg by mouth every 6 (six) hours as needed for moderate pain.    [provider]  Ascorbic Acid (VITAMIN C) 1000 MG tablet Take 1,000 mg by mouth daily.    [provider]  azelastine (ASTELIN) 0.1 % nasal spray Place 1 spray into both nostrils daily as needed for rhinitis.    [provider]  B Complex-C (SUPER B COMPLEX PO) Take 1 tablet by mouth daily.    [provider]  brimonidine (ALPHAGAN) 0.2 % ophthalmic solution Place 1 drop into the right eye in the morning and at bedtime.    [provider]  dicyclomine (BENTYL) 10 MG capsule Take 10 mg by mouth 3 (three) times daily as needed for spasms.    [provider]  ezetimibe (ZETIA) 10 MG tablet Take 5 mg by mouth daily. 01/11/22   [provider]  fluticasone (FLONASE) 50 MCG/ACT nasal spray Place 1 spray into both nostrils daily as needed for allergies.    [provider]  Fremanezumab-vfrm (AJOVY) 225 MG/1.5ML SOAJ Inject 225 mg into the skin every 30 (thirty) days. 01/18/23   Glean Salvo, NP  HYDROmorphone (DILAUDID) 2 MG tablet Take 0.5-1 tablets (1-2 mg total) by mouth every 8 (eight) hours as needed for moderate pain or severe pain. 02/21/23   Edmisten, Kristie L, PA  latanoprost (XALATAN) 0.005 % ophthalmic solution Place 1 drop into the right eye at  bedtime.    [provider]  metFORMIN (GLUCOPHAGE) 1000 MG tablet Take 1 tablet (1,000 mg total) by mouth 2 (two) times daily with a meal. 02/01/23 07/31/23  Ardith Dark, MD  methocarbamol (ROBAXIN) 500 MG tablet Take 1 tablet (500 mg total) by mouth every 6 (six) hours as needed for muscle spasms. 02/21/23   Edmisten, Kristie L, PA  metoprolol tartrate (LOPRESSOR) 25 MG tablet Take 12.5 mg by mouth daily.    [provider]  Multiple Vitamins-Minerals (MULTIVITAMINS THER. W/MINERALS) TABS Take 1 tablet by mouth daily.    [provider]  nitroGLYCERIN (NITROSTAT) 0.4 MG SL tablet Place 0.4 mg under the tongue every 5 (five) minutes as needed for chest pain.    [provider]  pantoprazole (PROTONIX) 40 MG tablet Take 40 mg by mouth daily. 01/28/21   [provider]  PRESCRIPTION MEDICATION Pt has CPAP machine    [provider]  promethazine (PHENERGAN) 12.5 MG tablet Take 1 tablet (12.5 mg total) by mouth every 6 (six) hours as needed for nausea or vomiting. 02/21/23   Edmisten, Kristie L, PA  Rimegepant Sulfate (NURTEC) 75 MG TBDP Take 75 mg by mouth daily as needed. For migraines. Take as close to onset of migraine as possible. One daily maximum. 11/30/22   Glean Salvo, NP  rosuvastatin (CRESTOR) 40 MG tablet Take 40 mg by mouth daily. 01/28/21   [provider]  tamsulosin (FLOMAX) 0.4 MG CAPS capsule TAKE 1 CAPSULE BY MOUTH EVERY DAY 03/07/22   Jarold Motto, PA  traMADol (ULTRAM) 50 MG tablet Take 100 mg by mouth every 6 (six) hours as needed (migraines). 01/11/22   [provider]  vitamin E 180 MG (400 UNITS) capsule Take 400 Units by mouth daily.    [provider]                                                                                                                                    Past Surgical History Past Surgical History:  Procedure Laterality Date   BACK SURGERY     CATARACT EXTRACTION  Bilateral 2019   CATARACT EXTRACTION W/ INTRAOCULAR LENS IMPLANT Bilateral 01/2019   CERVICAL DISC SURGERY  1980's?   CORONARY ANGIOPLASTY WITH STENT PLACEMENT  10/13/2011   DES to the proximal RCA. Dr Swaziland   FRACTIONAL FLOW RESERVE  WIRE Right 10/13/2011   Procedure: FRACTIONAL FLOW RESERVE WIRE;  Surgeon: Peter M Swaziland, MD;  Location: [facility] CATH LAB;  Service: Cardiovascular;  Laterality: Right;   HERNIA REPAIR     INGUINAL HERNIA REPAIR  10/23/2012   Procedure: HERNIA REPAIR INGUINAL ADULT;  Surgeon: Adolph Pollack, MD;  Location: [facility] OR;  Service: General;  Laterality: Left;  Left lower quadrant; left inguinal hernia repair with mesh   INSERTION OF MESH  10/23/2012   Procedure: INSERTION OF MESH;  Surgeon: Adolph Pollack, MD;  Location: [facility] OR;  Service: General;  Laterality: Left;  Left lower quadrant   LEFT HEART CATHETERIZATION WITH CORONARY ANGIOGRAM N/A 10/13/2011   Procedure: LEFT HEART CATHETERIZATION WITH CORONARY ANGIOGRAM;  Surgeon: Peter M Swaziland, MD;  Location: [facility] CATH LAB;  Service: Cardiovascular;  Laterality: N/A;   PERCUTANEOUS CORONARY STENT INTERVENTION (PCI-S) Right 10/13/2011   Procedure: PERCUTANEOUS CORONARY STENT INTERVENTION (PCI-S);  Surgeon: Peter M Swaziland, MD;  Location: [facility] CATH LAB;  Service: Cardiovascular;  Laterality: Right;   SHOULDER ARTHROSCOPY W/ ROTATOR CUFF REPAIR Left 1990's   TOTAL HIP ARTHROPLASTY Left 05/14/2021   Procedure: LEFT TOTAL HIP ARTHROPLASTY ANTERIOR APPROACH;  Surgeon: Kathryne Hitch, MD;  Location: [facility] ORS;  Service: Orthopedics;  Laterality: Left;   TOTAL KNEE ARTHROPLASTY Right 02/20/2023   Procedure: TOTAL KNEE ARTHROPLASTY;  Surgeon: Ollen Gross, MD;  Location: [facility] ORS;  Service: Orthopedics;  Laterality: Right;   TRIGGER FINGER RELEASE Left    3rd and 4th digits   Family History Family History  Problem Relation Age of Onset   Cancer Mother        lung   Heart disease Father        heart attack    Cancer Father         colon   Migraines Neg Hx    Headache Neg Hx     Social History Social History   Tobacco Use   Smoking status: Former    Packs/day: 2.00    Years: 20.00    Additional pack years: 0.00    Total pack years: 40.00    Types: Cigarettes    Quit date: 11/14/1982    Years since quitting: 40.3   Smokeless tobacco: Never  Vaping Use   Vaping Use: Never used  Substance Use Topics   Alcohol use: Yes    Comment: occ   Drug use: No   Allergies Sumatriptan, Penicillins, Lipitor [atorvastatin], Oxycodone, and Simbrinza [brinzolamide-brimonidine]  Review of Systems Review of Systems  All other systems reviewed and are negative.   Physical Exam Vital Signs  I have reviewed the triage vital signs BP (!) 125/57   Pulse 62   Temp 97.8 F (36.6 C) (Oral)   Resp 14   Wt 88.9 kg   SpO2 99%   BMI 25.86 kg/m  Physical Exam Vitals and nursing note reviewed.  Constitutional:      General: He is not in acute distress.    Appearance: Normal appearance.  HENT:     Mouth/Throat:     Mouth: Mucous membranes are moist.  Eyes:     Conjunctiva/sclera: Conjunctivae normal.  Cardiovascular:     Rate and Rhythm: Normal rate and regular rhythm.  Pulmonary:     Effort: Pulmonary effort is normal. No respiratory distress.     Breath sounds: Normal breath sounds.  Abdominal:     General: Abdomen is flat.     Palpations: Abdomen is soft.     Tenderness:  There is no abdominal tenderness.  Musculoskeletal:     Right lower leg: No tenderness. Edema (mild swelling around knee only) present.     Left lower leg: No tenderness. No edema.  Skin:    General: Skin is warm and dry.     Capillary Refill: Capillary refill takes less than 2 seconds.  Neurological:     Mental Status: He is alert and oriented to person, place, and time. Mental status is at baseline.  Psychiatric:        Mood and Affect: Mood normal.        Behavior: Behavior normal.     ED Results and Treatments Labs (all labs  ordered are listed, but only abnormal results are displayed) Labs Reviewed  COMPREHENSIVE METABOLIC PANEL - Abnormal; Notable for the following components:      Result Value   Sodium 130 (*)    Chloride 96 (*)    Glucose, Bld 227 (*)    Total Protein 6.3 (*)    AST 12 (*)    All other components within normal limits  CBC WITH DIFFERENTIAL/PLATELET - Abnormal; Notable for the following components:   RBC 3.68 (*)    Hemoglobin 11.4 (*)    HCT 34.5 (*)    All other components within normal limits  BRAIN NATRIURETIC PEPTIDE  TROPONIN I (HIGH SENSITIVITY)                                                                                                                          Radiology CT Angio Chest PE W/Cm &/Or Wo Cm  Result Date: 03/19/2023 CLINICAL DATA:  Pulmonary embolus suspected EXAM: CT ANGIOGRAPHY CHEST WITH CONTRAST TECHNIQUE: Multidetector CT imaging of the chest was performed using the standard protocol during bolus administration of intravenous contrast. Multiplanar CT image reconstructions and MIPs were obtained to evaluate the vascular anatomy. RADIATION DOSE REDUCTION: This exam was performed according to the departmental dose-optimization program which includes automated exposure control, adjustment of the mA and/or kV according to patient size and/or use of iterative reconstruction technique. CONTRAST:  80mL OMNIPAQUE IOHEXOL 350 MG/ML SOLN COMPARISON:  Chest CTA dated October 10, 2011 FINDINGS: Cardiovascular: No evidence of pulmonary embolus. Normal heart size. No pericardial effusion. Normal caliber thoracic aorta with moderate atherosclerotic disease. Moderate coronary artery calcifications. Mediastinum/Nodes: Esophagus and thyroid are unremarkable. No enlarged lymph nodes seen in the chest. Lungs/Pleura: Central airways are patent. Calcified upper lobe pulmonary nodule. Mild bibasilar atelectasis. No consolidation, pleural effusion or pneumothorax. Upper Abdomen: No acute  abnormality. Musculoskeletal: No chest wall abnormality. No acute or significant osseous findings. Review of the MIP images confirms the above findings. IMPRESSION: 1. No evidence of pulmonary embolus or acute airspace opacity. 2. Coronary artery calcifications and aortic Atherosclerosis (ICD10-I70.0). Electronically Signed   By: Allegra Lai M.D.   On: 03/19/2023 18:59    Pertinent labs & imaging results that were available during my care of the patient were reviewed by me and considered  in my medical decision making (see MDM for details).  Medications Ordered in ED Medications  iohexol (OMNIPAQUE) 350 MG/ML injection 100 mL (80 mLs Intravenous Contrast Given 03/19/23 1811)                                                                                                                                     Procedures Procedures  (including critical care time)  Medical Decision Making / ED Course   MDM:  77 year old male presenting with 4 weeks of shortness of breath.  Patient overall well-appearing, physical exam reassuring with normal breath sounds, no hypoxia.  Has minimal swelling around the right knee postsurgery.  Will obtain CT angiography of the chest given patient is extremely high risk for pulmonary embolism given recent surgery.  Doubt other process such as pneumonia, pneumothorax but this would also be evaluated on CT of the lungs.  Low concern for ACS, EKG reassuring, will check troponin.  Patient does not appear volume overloaded will check BNP.  Doubt pericardial effusion but this will be seen on CTA chest.  If workup in the emergency department is overall reassuring including negative CTA and normal troponin likely discharge with further outpatient follow-up.  Also given unintentional weight loss advised follow-up with PMD for further workup.  He does have an appointment in 2 days for this.  Clinical Course as of 03/19/23 1931  Wynelle Link Mar 19, 2023  1930 Labs reassuring  including negative troponin, normal BNP.  CTA chest without evidence of acute cardiopulmonary pathology including no PE, pneumothorax, pneumonia.  Labs do show mild hyperglycemia.  His symptoms of fatigue, lightheadedness when standing could be due to mild dehydration in the setting.  Advise close follow-up with primary physician.  He has an appointment this Tuesday. he has continued follow-up coming up with cardiology for further workup including echocardiogram and possible outpatient monitoring.  Given reassuring workup I do not think he needs to be admitted for this testing at this time. Will discharge patient to home. All questions answered. Patient comfortable with plan of discharge. Return precautions discussed with patient and specified on the after visit summary.  [WS]    Clinical Course User Index [WS] Lonell Grandchild, MD     Additional history obtained: -Additional history obtained from spouse -External records from outside source obtained and reviewed including: Chart review including previous notes, labs, imaging, consultation notes including cardiology notes   Lab Tests: -I ordered, reviewed, and interpreted labs.   The pertinent results include:   Labs Reviewed  COMPREHENSIVE METABOLIC PANEL - Abnormal; Notable for the following components:      Result Value   Sodium 130 (*)    Chloride 96 (*)    Glucose, Bld 227 (*)    Total Protein 6.3 (*)    AST 12 (*)    All other components within normal limits  CBC WITH DIFFERENTIAL/PLATELET - Abnormal; Notable for the following components:  RBC 3.68 (*)    Hemoglobin 11.4 (*)    HCT 34.5 (*)    All other components within normal limits  BRAIN NATRIURETIC PEPTIDE  TROPONIN I (HIGH SENSITIVITY)    Notable for mild hyperglycemia, normal troponin/BNP  EKG   EKG Interpretation  Date/Time:  Sunday Mar 19 2023 16:09:34 EDT Ventricular Rate:  71 PR Interval:  179 QRS Duration: 95 QT Interval:  397 QTC Calculation: 432 R  Axis:   82 Text Interpretation: Sinus rhythm Borderline right axis deviation Confirmed by Alvino Blood ([id number]) on 03/19/2023 4:14:13 PM         Imaging Studies ordered: I ordered imaging studies including CTA chest On my interpretation imaging demonstrates no acute process, no PE I independently visualized and interpreted imaging. I agree with the radiologist interpretation   Medicines ordered and prescription drug management: Meds ordered this encounter  Medications   iohexol (OMNIPAQUE) 350 MG/ML injection 100 mL    -I have reviewed the patients home medicines and have made adjustments as needed   Cardiac Monitoring: The patient was maintained on a cardiac monitor.  I personally viewed and interpreted the cardiac monitored which showed an underlying rhythm of: NSR  Social Determinants of Health:  Diagnosis or treatment significantly limited by social determinants of health: former smoker   Co morbidities that complicate the patient evaluation  Past Medical History:  Diagnosis Date   Arthritis    CAD (coronary artery disease) 10/10/2011   NSTEMI with DES to the proximal RCA following flow wire evaluation. EF is normal.    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    Final Clinical Impression(s) / ED Diagnoses Final diagnoses:  Shortness of breath     This chart was  dictated using voice recognition software.  Despite best efforts to proofread,  errors can occur which can change the documentation meaning.    Lonell Grandchild, MD 03/19/23 [phone]

## 2023-03-20 ENCOUNTER — Telehealth: Payer: Self-pay | Admitting: Student

## 2023-03-20 NOTE — Telephone Encounter (Signed)
Patient's wife is following up. She states patient had CT and would like to know if Carlos Levering, NP, will have an opportunity to review it and provide any feedback. She states that she results were good. She also mentions that  the patient has an appointment with PCP this morning.

## 2023-03-20 NOTE — Telephone Encounter (Signed)
Patient was sent to the ER yesterday per Ephriam Knuckles, NP!

## 2023-03-20 NOTE — Telephone Encounter (Signed)
Spoke to patient's wife.She was calling just to thank Robert Levering NP for being so good yesterday at ED.Stated she wanted her to know how they greatly appreciated her.Advised I will send message to her.

## 2023-03-20 NOTE — Telephone Encounter (Signed)
He has been moved to this Friday at 2:45pm

## 2023-03-21 ENCOUNTER — Ambulatory Visit (INDEPENDENT_AMBULATORY_CARE_PROVIDER_SITE_OTHER): Payer: Medicare Other | Admitting: Family Medicine

## 2023-03-21 ENCOUNTER — Encounter: Payer: Self-pay | Admitting: Family Medicine

## 2023-03-21 VITALS — BP 118/62 | HR 69 | Temp 97.8°F | Ht 73.0 in | Wt 198.8 lb

## 2023-03-21 DIAGNOSIS — N419 Inflammatory disease of prostate, unspecified: Secondary | ICD-10-CM

## 2023-03-21 DIAGNOSIS — I152 Hypertension secondary to endocrine disorders: Secondary | ICD-10-CM

## 2023-03-21 DIAGNOSIS — E1165 Type 2 diabetes mellitus with hyperglycemia: Secondary | ICD-10-CM | POA: Diagnosis not present

## 2023-03-21 DIAGNOSIS — E1159 Type 2 diabetes mellitus with other circulatory complications: Secondary | ICD-10-CM | POA: Diagnosis not present

## 2023-03-21 NOTE — Patient Instructions (Addendum)
It was very nice to see you today!  I think your symptoms are probably coming from a combination of different factors including anemia, dehydration, and medications.  Please make sure that you are getting plenty of fluid and staying well-hydrated.  Please try taking an iron supplement such as ferrous sulfate 65 mg every other day on empty stomach with vitamin C.  Please stop the Flomax.  Send me a message in a few weeks to let me know how you are doing.  Return in about 3 months (around 06/21/2023) for Follow Up.   Take care, Dr Jimmey Ralph  PLEASE NOTE:  If you had any lab tests, please let us know if you have not heard back within a few days. You may see your results on mychart before we have a chance to review them but we will give you a call once they are reviewed by [place].   If we ordered any referrals today, please let us know if you have not heard from their office within the next week.   If you had any urgent prescriptions sent in today, please check with the pharmacy within an hour of our visit to make sure the prescription was transmitted appropriately.   Please try these tips to maintain a healthy lifestyle:  Eat at least 3 REAL meals and 1-2 snacks per day.  Aim for no more than 5 hours between eating.  If you eat breakfast, please do so within one hour of getting up.   Each meal should contain half fruits/vegetables, one quarter protein, and one quarter carbs (no bigger than a computer mouse)  Cut down on sweet beverages. This includes juice, soda, and sweet tea.   Drink at least 1 glass of water with each meal and aim for at least 8 glasses per day  Exercise at least 150 minutes every week.

## 2023-03-21 NOTE — Assessment & Plan Note (Signed)
Too early to recheck A1c today.  He will come back in 3 months and recheck A1c at that time.  Continue metformin 1000 mg twice daily.

## 2023-03-21 NOTE — Assessment & Plan Note (Signed)
Follows with urology.  Currently on Flomax 0.4 mg daily.  He is not sure if this is giving him any benefit at this point.  Will discontinue for a few weeks to see if this helps with his symptoms.

## 2023-03-21 NOTE — Assessment & Plan Note (Signed)
Blood pressure on the lower side today.  Potentially causing some of his issues as above.  Will continue metoprolol 12.5 mg daily but will be holding his Flomax as below.  Will consider stopping metoprolol if symptoms persist.

## 2023-03-21 NOTE — Progress Notes (Signed)
Robert [name] is a 77 y.o. male who presents today for an office visit.  Assessment/Plan:  New/Acute Problems: Dyspnea on Exertion / Dizziness Workup in the ED was reassuring and he is currently following with cardiology for this.  His blood pressure is a bit on the lower side and he did have positive orthostatic vital signs at cardiology last week.  Symptoms are probably multifactorial in setting of slight blood loss anemia related to his surgery a month ago, dehydration after his surgery, and possible mild orthostatic hypotension due to medications including metoprolol and Flomax.  It is reassuring that his symptoms have been improving for the last few days or so.  We will trial Flomax for a few weeks to see if this helps with his symptoms.  He will work on making sure that he is getting plenty of fluids.  He will also start a iron supplement as below.  He will follow-up with [place] in a couple of weeks via MyChart.  Would consider trial of metoprolol if still has ongoing issues with dizziness with standing and his blood pressure remains low.  He will follow-up with cardiology as planned and does have upcoming echocardiogram later this week.   Anemia Hemoglobin 11.4 most recent labs compared to baseline of 14 prior to his surgery.  Recommend starting ferrous sulfate 65 mg every other day to nifty stomach with vitamin C.  We can recheck in 3 months.  Chronic Problems Addressed Today: Type 2 diabetes mellitus with hyperglycemia ([name]) Too early to recheck A1c today.  He will come back in 3 months and recheck A1c at that time.  Continue metformin 1000 mg twice daily.  Hypertension associated with diabetes ([name]) Blood pressure on the lower side today.  Potentially causing some of his issues as above.  Will continue metoprolol 12.5 mg daily but will be holding his Flomax as below.  Will consider stopping metoprolol if symptoms persist.   Prostatitis, recurrent Follows with urology.  Currently on Flomax  0.4 mg daily.  He is not sure if this is giving him any benefit at this point.  Will discontinue for a few weeks to see if this helps with his symptoms.     Subjective:  HPI:  See A/P for status of chronic conditions.  Patient is here today for follow-up.  We saw him a couple of months agoA1c at that time was 7.6.  We continued him on metformin 1000 mg twice daily.  He has knee replacement about 5 weeks ago.  Since then he has had ongoing issues with dizziness and shortness of breath on exertion.  He has been following with cardiology for this.  He has also been in the ED a few times for this.  Most recently was in the ED 2 days ago.  Labs there were reassuring.  He did have a CT angiogram to rule out PULMONARY EMBOLISM.  This showed no PE, pneumothorax, or pneumonia.  He was discharged home.  His hemoglobin has been around 11 in the last month since his surgery compared to a baseline of 14 prior to her surgery.  He does feel like his symptoms are improving slightly the last several days. He has echocardiogram upcoming later this week. He does note that he last about 20 pounds in the last month or so.  Admits his appetite has not been great though does seem to be improving.  He has been trying to stay well-hydrated.  No syncopal episodes.  No chest pain.  Objective:  Physical Exam: BP 118/62   Pulse 69   Temp 97.8 F (36.6 C) (Temporal)   Ht 6\' 1"  (1.854 m)   Wt 198 lb 12.8 oz (90.2 kg)   SpO2 96%   BMI 26.23 kg/m   Gen: No acute distress, resting comfortably CV: Regular rate and rhythm with no murmurs appreciated Pulm: Normal work of breathing, clear to auscultation bilaterally with no crackles, wheezes, or rhonchi Neuro: Grossly normal, moves all extremities Psych: Normal affect and thought content  Time Spent: 45 minutes of total time was spent on the date of the encounter performing the following actions: chart review prior to seeing the patient including recent Emergency  Department visit, obtaining history, performing a medically necessary exam, counseling on the treatment plan, placing orders, and documenting in our EHR.        Katina Degree. Jimmey Ralph, MD 03/21/2023 9:16 AM

## 2023-03-24 ENCOUNTER — Ambulatory Visit (HOSPITAL_COMMUNITY): Payer: Medicare Other | Attending: Cardiovascular Disease

## 2023-03-24 DIAGNOSIS — R0609 Other forms of dyspnea: Secondary | ICD-10-CM | POA: Diagnosis present

## 2023-03-24 LAB — ECHOCARDIOGRAM COMPLETE
Area-P 1/2: 1.9 cm2
S' Lateral: 2.2 cm

## 2023-03-28 ENCOUNTER — Ambulatory Visit: Payer: Medicare Other | Admitting: Neurology

## 2023-03-28 ENCOUNTER — Encounter: Payer: Self-pay | Admitting: Neurology

## 2023-03-28 DIAGNOSIS — G43711 Chronic migraine without aura, intractable, with status migrainosus: Secondary | ICD-10-CM

## 2023-03-28 MED ORDER — NURTEC 75 MG PO TBDP
75.0000 mg | ORAL_TABLET | Freq: Every day | ORAL | 11 refills | Status: AC | PRN
Start: 2023-03-28 — End: ?

## 2023-03-28 MED ORDER — AJOVY 225 MG/1.5ML ~~LOC~~ SOAJ: 225.0000 mg | SUBCUTANEOUS | 3 refills | Status: DC

## 2023-03-28 NOTE — Progress Notes (Signed)
Patient: Robert [name] Date of Birth: 29-Nov-1945  Reason for Visit: Follow up History from: Patient, wife Primary Neurologist: Lucia Gaskins   ASSESSMENT AND PLAN 77 y.o. year old male   1.  Intractable chronic migraine headache  -Migraines are improved with Ajovy, remains on Botox from the [place], on average 1-2 migraines a month -Continue Ajovy for migraine prevention -Continue Nurtec as needed for acute headache -Call for worsening headache, follow-up in 1 year or sooner if needed  Meds ordered this encounter  Medications   Rimegepant Sulfate (NURTEC) 75 MG TBDP    Sig: Take 1 tablet (75 mg total) by mouth daily as needed. For migraines. Take as close to onset of migraine as possible. One daily maximum.    Dispense:  10 tablet    Refill:  11    PA is pending from [facility]. We will update as soon as we hear back.   Fremanezumab-vfrm (AJOVY) 225 MG/1.5ML SOAJ    Sig: Inject 225 mg into the skin every 30 (thirty) days.    Dispense:  4.5 mL    Refill:  3    Please expedite Medication**   No orders of the defined types were placed in this encounter.  HISTORY OF PRESENT ILLNESS: Today 03/28/23 When last seen, stop Emgality, switch to Ajovy.  ESR, CRP were normal.  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  Mild chronic microvascular ischemic change. He had right knee replacement 1 month ago, he has been in the ER twice for SOB, has just felt bad, dizzy, fall. Has had 4 months of Ajovy, still getting Botox from [place]. Hard to give accurate # of migraine since feeling so poorly after knee replacement. Pleased with Nurtec, gets # 8 a month. Hasn't taken any since the operation. It does work well. He has lost 20 lbs since surgery. He is starting to feel some back to normal. Supposed to start back up the 24th with PT. He was too busy with appointments. About 1-2 migraines a month with Ajovy.   Update  11/30/22 SS: here today with his wife. Remains on Emgality for  about a year, it was working well until 3 months ago. Remains on Botox for migraine from the [place]. He stopped the corrugator and procerus muscles due to drooping. Bad migraines since 1960's. Has 5-6 migraines a week with pressure, nausea. Miserable quality of life. Headaches getting worse in the last 3 months. He rarely has a good day. Headaches are prolonged, throbbing which is new. He has concerns, mentions getting an MRI. Vertigo has returned. Takes meclizine with good benefit. Takes tramadol and tylenol for migraine. Nurtec helps, but he doesn't take it, worries about taking too often. Has history of MI 8 years ago with stent. 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   HISTORY  Dr. Lucia Gaskins 08/08/2022: he had botox a few weeks ago. He has been feeling pretty good. Emgality is helping again. Gave him some samples because it is 180 a month - but getting new insurance next month, contact us will new insurance and we can refill emgality and see if it helps. Discussed emgality patient assistance, he declines.   REVIEW OF SYSTEMS: Out of a complete 14 system review of symptoms, the patient complains only of the following symptoms, and all other reviewed systems are negative.  See HPI  ALLERGIES: Allergies  Allergen Reactions   Sumatriptan Shortness Of Breath    REACTION: sob,  headache scalp sensitivity   Penicillins Itching    Tolerated Cephalosporin Date: 02/21/23.     Lipitor [Atorvastatin] Nausea And Vomiting   Oxycodone Itching   Simbrinza [Brinzolamide-Brimonidine]     Itching of eye    HOME MEDICATIONS: Outpatient Medications Prior to Visit  Medication Sig Dispense Refill   acetaminophen (TYLENOL) 500 MG tablet Take 1,000 mg by mouth every 6 (six) hours as needed for moderate pain.     Ascorbic Acid (VITAMIN C) 1000 MG tablet Take 1,000 mg by mouth daily.     azelastine (ASTELIN) 0.1 % nasal spray Place 1 spray into both nostrils daily as needed for rhinitis.     B  Complex-C (SUPER B COMPLEX PO) Take 1 tablet by mouth daily.     brimonidine (ALPHAGAN) 0.2 % ophthalmic solution Place 1 drop into the right eye in the morning and at bedtime.     dicyclomine (BENTYL) 10 MG capsule Take 10 mg by mouth 3 (three) times daily as needed for spasms.     ezetimibe (ZETIA) 10 MG tablet Take 5 mg by mouth daily.     fluticasone (FLONASE) 50 MCG/ACT nasal spray Place 1 spray into both nostrils daily as needed for allergies.     HYDROmorphone (DILAUDID) 2 MG tablet Take 0.5-1 tablets (1-2 mg total) by mouth every 8 (eight) hours as needed for moderate pain or severe pain. 21 tablet 0   latanoprost (XALATAN) 0.005 % ophthalmic solution Place 1 drop into the right eye at bedtime.     metFORMIN (GLUCOPHAGE) 1000 MG tablet Take 1 tablet (1,000 mg total) by mouth 2 (two) times daily with a meal. 180 tablet 1   methocarbamol (ROBAXIN) 500 MG tablet Take 1 tablet (500 mg total) by mouth every 6 (six) hours as needed for muscle spasms. 40 tablet 0   metoprolol tartrate (LOPRESSOR) 25 MG tablet Take 12.5 mg by mouth daily.     Multiple Vitamins-Minerals (MULTIVITAMINS THER. W/MINERALS) TABS Take 1 tablet by mouth daily.     nitroGLYCERIN (NITROSTAT) 0.4 MG SL tablet Place 0.4 mg under the tongue every 5 (five) minutes as needed for chest pain.     pantoprazole (PROTONIX) 40 MG tablet Take 40 mg by mouth daily.     PRESCRIPTION MEDICATION Pt has CPAP machine     promethazine (PHENERGAN) 12.5 MG tablet Take 1 tablet (12.5 mg total) by mouth every 6 (six) hours as needed for nausea or vomiting. 30 tablet 0   rosuvastatin (CRESTOR) 40 MG tablet Take 40 mg by mouth daily.     tamsulosin (FLOMAX) 0.4 MG CAPS capsule TAKE 1 CAPSULE BY MOUTH EVERY DAY 30 capsule 1   traMADol (ULTRAM) 50 MG tablet Take 100 mg by mouth every 6 (six) hours as needed (migraines).     vitamin E 180 MG (400 UNITS) capsule Take 400 Units by mouth daily.     Fremanezumab-vfrm (AJOVY) 225 MG/1.5ML SOAJ Inject 225  mg into the skin every 30 (thirty) days. 4.5 mL 3   Rimegepant Sulfate (NURTEC) 75 MG TBDP Take 75 mg by mouth daily as needed. For migraines. Take as close to onset of migraine as possible. One daily maximum. 10 tablet 6   No facility-administered medications prior to visit.    PAST MEDICAL HISTORY: Past Medical History:  Diagnosis Date   Arthritis    CAD (coronary artery disease) 10/10/2011   NSTEMI with DES to the proximal RCA following flow wire evaluation. EF is normal.    Diabetes mellitus  without complication ([name])    GERD (gastroesophageal reflux disease)    H/O esophageal ulcer    History of stomach ulcers    Hyperlipidemia    Hypertension    Migraines    "quite often; maybe q 10d to 2 wk" (01/14/2014)   Myocardial infarction ([name]) 10/2011   OSA on CPAP    "wear it most of the time" (01/14/2014)   Squamous cell cancer of skin of earlobe    "left"   Squamous cell carcinoma 12/17/2016   Stroke ([name])    found on MRI - pt unaware had stroke approx 7 years ago    PAST SURGICAL HISTORY: Past Surgical History:  Procedure Laterality Date   BACK SURGERY     CATARACT EXTRACTION Bilateral 2019   CATARACT EXTRACTION W/ INTRAOCULAR LENS IMPLANT Bilateral 01/2019   CERVICAL DISC SURGERY  1980's?   CORONARY ANGIOPLASTY WITH STENT PLACEMENT  10/13/2011   DES to the proximal RCA. Dr Swaziland   FRACTIONAL FLOW RESERVE WIRE Right 10/13/2011   Procedure: FRACTIONAL FLOW RESERVE WIRE;  Surgeon: Peter M Swaziland, MD;  Location: [facility] CATH LAB;  Service: Cardiovascular;  Laterality: Right;   HERNIA REPAIR     INGUINAL HERNIA REPAIR  10/23/2012   Procedure: HERNIA REPAIR INGUINAL ADULT;  Surgeon: Adolph Pollack, MD;  Location: [facility] OR;  Service: General;  Laterality: Left;  Left lower quadrant; left inguinal hernia repair with mesh   INSERTION OF MESH  10/23/2012   Procedure: INSERTION OF MESH;  Surgeon: Adolph Pollack, MD;  Location[facility] OR;  Service: General;  Laterality: Left;  Left lower  quadrant   LEFT HEART CATHETERIZATION WITH CORONARY ANGIOGRAM N/A 10/13/2011   Procedure: LEFT HEART CATHETERIZATION WITH CORONARY ANGIOGRAM;  Surgeon: Peter M Swaziland, MD;  Location: [facility] CATH LAB;  Service: Cardiovascular;  Laterality: N/A;   PERCUTANEOUS CORONARY STENT INTERVENTION (PCI-S) Right 10/13/2011   Procedure: PERCUTANEOUS CORONARY STENT INTERVENTION (PCI-S);  Surgeon: Peter M Swaziland, MD;  Location: [facility] CATH LAB;  Service: Cardiovascular;  Laterality: Right;   SHOULDER ARTHROSCOPY W/ ROTATOR CUFF REPAIR Left 1990's   TOTAL HIP ARTHROPLASTY Left 05/14/2021   Procedure: LEFT TOTAL HIP ARTHROPLASTY ANTERIOR APPROACH;  Surgeon: Kathryne Hitch, MD;  Location: [facility] ORS;  Service: Orthopedics;  Laterality: Left;   TOTAL KNEE ARTHROPLASTY Right 02/20/2023   Procedure: TOTAL KNEE ARTHROPLASTY;  Surgeon: Ollen Gross, MD;  Location: [facility] ORS;  Service: Orthopedics;  Laterality: Right;   TRIGGER FINGER RELEASE Left    3rd and 4th digits    FAMILY HISTORY: Family History  Problem Relation Age of Onset   Cancer Mother        lung   Heart disease Father        heart attack    Cancer Father        colon   Migraines Neg Hx    Headache Neg Hx     SOCIAL HISTORY: Social History   Socioeconomic History   Marital status: Married    Spouse name: Not on file   Number of children: Not on file   Years of education: Not on file   Highest education level: 12th grade  Occupational History    Comment: Retired -[employer]  Tobacco Use   Smoking status: Former    Packs/day: 2.00    Years: 20.00    Additional pack years: 0.00    Total pack years: 40.00    Types: Cigarettes    Quit date: 11/14/1982    Years since quitting:  40.3   Smokeless tobacco: Never  Vaping Use   Vaping Use: Never used  Substance and Sexual Activity   Alcohol use: Yes    Comment: occ   Drug use: No   Sexual activity: Not on file  Other Topics Concern   Not on file  Social History Narrative   Pt lives with his wife.  1 and a half story home  No issues with stairs. 12th grade education   Social Determinants of Health   Financial Resource Strain: Low Risk  (09/15/2022)   Overall Financial Resource Strain (CARDIA)    Difficulty of Paying Living Expenses: Not hard at all  Food Insecurity: No Food Insecurity (02/20/2023)   Hunger Vital Sign    Worried About Running Out of Food in the Last Year: Never true    Ran Out of Food in the Last Year: Never true  Transportation Needs: No Transportation Needs (02/20/2023)   PRAPARE - Administrator, Civil Service (Medical): No    Lack of Transportation (Non-Medical): No  Physical Activity: Inactive (09/15/2022)   Exercise Vital Sign    Days of Exercise per Week: 0 days    Minutes of Exercise per Session: 0 min  Stress: No Stress Concern Present (09/15/2022)   [employer] of Occupational Health - Occupational Stress Questionnaire    Feeling of Stress : Not at all  Social Connections: Moderately Integrated (09/15/2022)   Social Connection and Isolation Panel [NHANES]    Frequency of Communication with Friends and Family: Three times a week    Frequency of Social Gatherings with Friends and Family: More than three times a week    Attends Religious Services: Never    Database administrator or Organizations: Yes    Attends Banker Meetings: 1 to 4 times per year    Marital Status: Married  Catering manager Violence: Not At Risk (02/20/2023)   Humiliation, Afraid, Rape, and Kick questionnaire    Fear of Current or Ex-Partner: No    Emotionally Abused: No    Physically Abused: No    Sexually Abused: No   PHYSICAL EXAM  Vitals:   03/28/23 1434  BP: (!) 144/70  Pulse: 89  Weight: 198 lb (89.8 kg)  Height: 6\' 1"  (1.854 m)   Body mass index is 26.12 kg/m.  Generalized: Well developed, in no acute distress  Neurological examination  Mentation: Alert oriented to time, place, history taking. Follows all commands speech and language  fluent Cranial nerve II-XII: Pupils were equal round reactive to light. Extraocular movements were full, visual field were full on confrontational test. Facial sensation and strength were normal. Head turning and shoulder shrug  were normal and symmetric. Motor: The motor testing reveals 5 over 5 strength of all 4 extremities. Good symmetric motor tone is noted throughout.  Sensory: Sensory testing is intact to soft touch on all 4 extremities. No evidence of extinction is noted.  Coordination: Cerebellar testing reveals good finger-nose-finger and heel-to-shin bilaterally.  Gait and station: Gait is normal, slight limp on the right Reflexes: Deep tendon reflexes are symmetric and normal bilaterally.   DIAGNOSTIC DATA (LABS, IMAGING, TESTING) - I reviewed patient records, labs, notes, testing and imaging myself where available.  Lab Results  Component Value Date   WBC 6.9 03/19/2023   HGB 11.4 (L) 03/19/2023   HCT 34.5 (L) 03/19/2023   MCV 93.8 03/19/2023   PLT 198 03/19/2023      Component Value Date/Time   NA 130 (  L) 03/19/2023 1711   NA 139 06/09/2020 1054   K 4.0 03/19/2023 1711   CL 96 (L) 03/19/2023 1711   CO2 28 03/19/2023 1711   GLUCOSE 227 (H) 03/19/2023 1711   GLUCOSE 101 (H) 10/18/2006 1119   BUN 14 03/19/2023 1711   BUN 12 06/09/2020 1054   CREATININE 0.82 03/19/2023 1711   CREATININE 1.02 01/26/2018 1555   CALCIUM 9.1 03/19/2023 1711   PROT 6.3 (L) 03/19/2023 1711   PROT 6.6 06/09/2020 1054   ALBUMIN 3.8 03/19/2023 1711   ALBUMIN 4.6 06/09/2020 1054   AST 12 (L) 03/19/2023 1711   ALT 12 03/19/2023 1711   ALKPHOS 66 03/19/2023 1711   BILITOT 0.3 03/19/2023 1711   BILITOT 0.4 06/09/2020 1054   GFRNONAA >60 03/19/2023 1711   GFRAA 92 06/09/2020 1054   Lab Results  Component Value Date   CHOL 157 06/15/2022   HDL 58.60 06/15/2022   LDLCALC 75 06/15/2022   LDLDIRECT 137.0 03/31/2010   TRIG 120.0 06/15/2022   CHOLHDL 3 06/15/2022   Lab Results  Component  Value Date   HGBA1C 7.4 (H) 02/07/2023   Lab Results  Component Value Date   VITAMINB12 561 01/30/2023   Lab Results  Component Value Date   TSH 1.69 01/30/2023    Margie Ege, AGNP-C, DNP 03/28/2023, 3:02 PM [facility] [address] [place], [place] [address] [phone]

## 2023-03-29 ENCOUNTER — Telehealth: Payer: Self-pay | Admitting: Pharmacy Technician

## 2023-03-29 NOTE — Telephone Encounter (Signed)
Patient Advocate Encounter   Received notification that prior authorization for Nurtec 75MG  dispersible tablets is required.   PA submitted on 03/29/2023 Key [id number] Insurance [employer] Medicare Part D Electronic Prior Authorization Form Status is pending

## 2023-03-29 NOTE — Telephone Encounter (Signed)
Patient Advocate Encounter  Prior Authorization for Nurtec 75MG  dispersible tablets  has been approved.    PA# [id number] Insurance [employer] Medicare Part D Electronic Prior Authorization Form  Effective dates: 03/29/2023 through 11/14/2023

## 2023-04-05 ENCOUNTER — Other Ambulatory Visit (HOSPITAL_COMMUNITY): Payer: Medicare Other

## 2023-04-12 ENCOUNTER — Other Ambulatory Visit (HOSPITAL_BASED_OUTPATIENT_CLINIC_OR_DEPARTMENT_OTHER): Payer: Medicare Other

## 2023-04-16 NOTE — Progress Notes (Signed)
Cardiology Clinic Note   Date: 04/21/2023 ID: ALIJA [name], DOB Aug 30, 1946, MRN [id number]  Primary Cardiologist:  Robert Swaziland, MD  Patient Profile    Robert [name] is a 77 y.o. male who presents to the clinic today for follow up after cardiac testing.   Past medical history significant for: CAD. LHC 12/11/2001 (abnormal stress test): Minimal irregularities in RCA with no obstructive CAD. LHC 10/13/2011 (NSTEMI): Proximal RCA 70%.  PCI with DES 3.5 x 20 mm to proximal RCA. Nuclear stress test 01/15/2014: Negative for pharmacologic stress-induced ischemia.  EF 57%. Echo 03/24/2023: EF 65 to 70%.  Mild concentric LVH.  Grade I DD.  Normal RV function.  No significant valvular abnormalities. Hypertension. Hyperlipidemia. Lipid panel 06/15/2022: LDL 75, HDL 59, TG 120, total 157. CVA. 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 MRI brain 12/20/2022: Remote left occipital cortical stroke, unchanged compared to the previous MRI.  Scattered T2/FLAIR hyperintense foci in the subcortical and deep white matter consistent with mild chronic microvascular ischemic change, unchanged compared to the previous MRI.  No acute findings. OSA. Esophageal ulcer. Avascular necrosis. Chronic migraines. T2DM.   History of Present Illness    Robert [name]  is a longtime patient of cardiology. He is followed by Dr. Swaziland for the above outlined history.   Patient was last seen in the office by me on 03/13/2023 for dizziness, weakness and DOE. He underwent knee surgery on 02/20/2023 and was feeling weak and dizzy with position changes since. His wife reported this was going on prior to knee surgery. He reported decreased appetite but adequate hydration. He was tolerating physical therapy for his knee but was doing mainly seated/lying assisted stretching and exercises. CTA chest (PE protocol) in May was negative.  Echo was normal.  Patient was asked to stop metoprolol for 2 weeks to see if symptoms  improve.  PCP started patient on iron 03/21/2023.  Today, patient is accompanied by his wife. He is feeling much improved since holding metoprolol. He denies further dizziness per se. He reports occasionally when he gets up from a seated position he will have a brief pulse like feeling on the top of his head with a very mild amount of dyspnea that resolves quickly. He is tolerating physical therapy without difficulty and has resumed a lot of his normal activities at home. He denies chest pain, pressure, tightness. No lower extremity edema, orthopnea or PND. He has a colonoscopy coming up a week from Monday through the [place]. I think he is an acceptable risk to undergo that procedure with no further cardiac testing.       ROS: All other systems reviewed and are otherwise negative except as noted in History of Present Illness.  Studies Reviewed    ECG is not ordered today.       Physical Exam    VS:  BP 124/72   Pulse 81   Ht 6\' 1"  (1.854 m)   Wt 198 lb 12.8 oz (90.2 kg)   SpO2 96%   BMI 26.23 kg/m  , BMI Body mass index is 26.23 kg/m.  GEN: Well nourished, well developed, in no acute distress. Neck: No JVD or carotid bruits. Cardiac:  RRR. No murmurs. No rubs or gallops.   Respiratory:  Respirations regular and unlabored. Clear to auscultation without rales, wheezing or rhonchi. GI: Soft, nontender, nondistended. Extremities: Radials/DP/PT 2+ and equal bilaterally. No clubbing or cyanosis. No edema.  Skin: Warm and dry, no rash. Neuro: Strength intact.  Assessment & Plan    Weakness/dizziness/DOE.  Patient reports symptoms of weakness, positional dizziness, DOE since knee surgery on 02/20/2023.  Patient's wife reports the symptoms were occurring prior to that.  CTA chest was negative for PE and echo was normal.  Patient was trialed off metoprolol and reports he is feeling much improved. He reports occasional pulse like feeling in top of his his and dyspnea when he goes from sit to stand  that is very breif and resolves on its own. He is tolerating physical therapy well and is starting to return to normal activities. PCP started patient on iron supplement 03/21/2023.  He will continue to hold Metoprolol for now. Will consider resuming it at follow up.  CAD.  S/p PCI with DES to proximal RCA November 2012.  Nuclear stress test March 2015 negative.  Patient denies chest pain, pressure or tightness. Continue aspirin, Zetia, rosuvastatin, as needed SL NTG. Hypertension.  BP today 124/72.  Home BP 115-130.50-70. Patient with no further dizziness. No headaches or vision changes. Continue to monitor at home. If he sees it is trending up he will contact PCP or our office.  Hyperlipidemia.  LDL August 2023 75, not at goal.  Continue rosuvastatin and Zetia.  Disposition: Continue to hold metoprolol. Continue to monitor BP. Return in 3 months or sooner as needed.          Signed, Etta Grandchild. Raja Caputi, DNP, NP-C

## 2023-04-21 ENCOUNTER — Encounter: Payer: Self-pay | Admitting: Student

## 2023-04-21 ENCOUNTER — Ambulatory Visit: Payer: Medicare Other | Attending: Student | Admitting: Student

## 2023-04-21 VITALS — BP 124/72 | HR 81 | Ht 73.0 in | Wt 198.8 lb

## 2023-04-21 DIAGNOSIS — R0609 Other forms of dyspnea: Secondary | ICD-10-CM

## 2023-04-21 DIAGNOSIS — Z7984 Long term (current) use of oral hypoglycemic drugs: Secondary | ICD-10-CM

## 2023-04-21 DIAGNOSIS — I152 Hypertension secondary to endocrine disorders: Secondary | ICD-10-CM

## 2023-04-21 DIAGNOSIS — R42 Dizziness and giddiness: Secondary | ICD-10-CM | POA: Diagnosis not present

## 2023-04-21 DIAGNOSIS — R531 Weakness: Secondary | ICD-10-CM

## 2023-04-21 DIAGNOSIS — I2510 Atherosclerotic heart disease of native coronary artery without angina pectoris: Secondary | ICD-10-CM | POA: Diagnosis not present

## 2023-04-21 DIAGNOSIS — E1159 Type 2 diabetes mellitus with other circulatory complications: Secondary | ICD-10-CM

## 2023-04-21 DIAGNOSIS — E785 Hyperlipidemia, unspecified: Secondary | ICD-10-CM

## 2023-04-21 NOTE — Patient Instructions (Signed)
Medication Instructions:  No Changes *If you need a refill on your cardiac medications before your next appointment, please call your pharmacy*   Lab Work: No Labs If you have labs (blood work) drawn today and your tests are completely normal, you will receive your results only by: MyChart Message (if you have MyChart) OR A paper copy in the mail If you have any lab test that is abnormal or we need to change your treatment, we will call you to review the results.   Testing/Procedures: No Testing   Follow-Up: At [facility], you and your health needs are our priority.  As part of our continuing mission to provide you with exceptional heart care, we have created designated Provider Care Teams.  These Care Teams include your primary Cardiologist (physician) and Advanced Practice Providers (APPs -  Physician Assistants and Nurse Practitioners) who all work together to provide you with the care you need, when you need it.  We recommend signing up for the patient portal called "MyChart".  Sign up information is provided on this After Visit Summary.  MyChart is used to connect with patients for Virtual Visits (Telemedicine).  Patients are able to view lab/test results, encounter notes, upcoming appointments, etc.  Non-urgent messages can be sent to your provider as well.   To learn more about what you can do with MyChart, go to [email or web address].    Your next appointment:   3 month(s)  Provider:   Peter Jordan, MD     

## 2023-05-25 LAB — BASIC METABOLIC PANEL
BUN: 5 (ref 4–21)
CO2: 29 — AB (ref 13–22)
Chloride: 104 (ref 99–108)
Creatinine: 0.8 (ref 0.6–1.3)
Glucose: 116
Potassium: 5.2 mEq/L — AB (ref 3.5–5.1)
Sodium: 138 (ref 137–147)

## 2023-05-25 LAB — CBC AND DIFFERENTIAL
HCT: 42 (ref 41–53)
Hemoglobin: 13.9 (ref 13.5–17.5)
Platelets: 186 10*3/uL (ref 150–400)
WBC: 7.3

## 2023-05-25 LAB — TSH: TSH: 2.23 (ref 0.41–5.90)

## 2023-05-25 LAB — HEPATIC FUNCTION PANEL
ALT: 18 U/L (ref 10–40)
AST: 15 (ref 14–40)
Alkaline Phosphatase: 71 (ref 25–125)
Bilirubin, Direct: 0.1
Bilirubin, Total: 0.4

## 2023-05-25 LAB — LIPID PANEL
Cholesterol: 133 (ref 0–200)
HDL: 59 (ref 35–70)
LDL Cholesterol: 58
Triglycerides: 114 (ref 40–160)

## 2023-05-25 LAB — COMPREHENSIVE METABOLIC PANEL
Albumin: 4.9 (ref 3.5–5.0)
Calcium: 10.6 (ref 8.7–10.7)
eGFR: 90

## 2023-05-25 LAB — CBC: RBC: 4.59 (ref 3.87–5.11)

## 2023-05-25 LAB — PROTEIN / CREATININE RATIO, URINE
Albumin, U: 0.3
Creatinine, Urine: 58.3

## 2023-06-20 ENCOUNTER — Encounter: Payer: Medicare HMO | Admitting: Family Medicine

## 2023-06-20 ENCOUNTER — Ambulatory Visit (INDEPENDENT_AMBULATORY_CARE_PROVIDER_SITE_OTHER): Payer: Medicare Other | Admitting: Family Medicine

## 2023-06-20 ENCOUNTER — Encounter: Payer: Self-pay | Admitting: Family Medicine

## 2023-06-20 VITALS — BP 112/57 | HR 69 | Temp 97.3°F | Ht 73.0 in | Wt 201.0 lb

## 2023-06-20 DIAGNOSIS — E785 Hyperlipidemia, unspecified: Secondary | ICD-10-CM

## 2023-06-20 DIAGNOSIS — Z0000 Encounter for general adult medical examination without abnormal findings: Secondary | ICD-10-CM

## 2023-06-20 DIAGNOSIS — E1159 Type 2 diabetes mellitus with other circulatory complications: Secondary | ICD-10-CM

## 2023-06-20 DIAGNOSIS — Z0001 Encounter for general adult medical examination with abnormal findings: Secondary | ICD-10-CM

## 2023-06-20 DIAGNOSIS — Z7984 Long term (current) use of oral hypoglycemic drugs: Secondary | ICD-10-CM

## 2023-06-20 DIAGNOSIS — E1165 Type 2 diabetes mellitus with hyperglycemia: Secondary | ICD-10-CM | POA: Diagnosis not present

## 2023-06-20 DIAGNOSIS — E1169 Type 2 diabetes mellitus with other specified complication: Secondary | ICD-10-CM

## 2023-06-20 DIAGNOSIS — G43109 Migraine with aura, not intractable, without status migrainosus: Secondary | ICD-10-CM

## 2023-06-20 DIAGNOSIS — I152 Hypertension secondary to endocrine disorders: Secondary | ICD-10-CM

## 2023-06-20 LAB — POCT GLYCOSYLATED HEMOGLOBIN (HGB A1C): Hemoglobin A1C: 6.6 % — AB (ref 4.0–5.6)

## 2023-06-20 NOTE — Assessment & Plan Note (Signed)
Stable on Crestor 40 mg daily and Zetia 5 mg daily.  Recent lipid panel at goal.  Last LDL 58.  Will abstract this in the chart.

## 2023-06-20 NOTE — Assessment & Plan Note (Signed)
A1c 6.6.  Doing well with Synjardy 12.5 - 1000 once daily as prescribed by [place].  He will continue work on lifestyle modifications.  Follow-up in 6 months.

## 2023-06-20 NOTE — Assessment & Plan Note (Signed)
Not currently on any medications.  Diastolic is on the lower side today though blood pressure overall at goal.  Will continue with home monitoring.

## 2023-06-20 NOTE — Patient Instructions (Addendum)
It was very nice to see you today!  Your A1c today is 6.6.  Keep up the great work! No changes today.   Return in about 6 months (around 12/21/2023) for Follow Up.   Take care, Dr Jimmey Ralph  PLEASE NOTE:  If you had any lab tests, please let us know if you have not heard back within a few days. You may see your results on mychart before we have a chance to review them but we will give you a call once they are reviewed by [place].   If we ordered any referrals today, please let us know if you have not heard from their office within the next week.   If you had any urgent prescriptions sent in today, please check with the pharmacy within an hour of our visit to make sure the prescription was transmitted appropriately.   Please try these tips to maintain a healthy lifestyle:  Eat at least 3 REAL meals and 1-2 snacks per day.  Aim for no more than 5 hours between eating.  If you eat breakfast, please do so within one hour of getting up.   Each meal should contain half fruits/vegetables, one quarter protein, and one quarter carbs (no bigger than a computer mouse)  Cut down on sweet beverages. This includes juice, soda, and sweet tea.   Drink at least 1 glass of water with each meal and aim for at least 8 glasses per day  Exercise at least 150 minutes every week.    Preventive Care 15 Years and Older, Male Preventive care refers to lifestyle choices and visits with your health care provider that can promote health and wellness. Preventive care visits are also called wellness exams. What can I expect for my preventive care visit? Counseling During your preventive care visit, your health care provider may ask about your: Medical history, including: Past medical problems. Family medical history. History of falls. Current health, including: Emotional well-being. Home life and relationship well-being. Sexual activity. Memory and ability to understand (cognition). Lifestyle,  including: Alcohol, nicotine or tobacco, and drug use. Access to firearms. Diet, exercise, and sleep habits. Work and work Astronomer. Sunscreen use. Safety issues such as seatbelt and bike helmet use. Physical exam Your health care provider will check your: Height and weight. These may be used to calculate your BMI (body mass index). BMI is a measurement that tells if you are at a healthy weight. Waist circumference. This measures the distance around your waistline. This measurement also tells if you are at a healthy weight and may help predict your risk of certain diseases, such as type 2 diabetes and high blood pressure. Heart rate and blood pressure. Body temperature. Skin for abnormal spots. What immunizations do I need?  Vaccines are usually given at various ages, according to a schedule. Your health care provider will recommend vaccines for you based on your age, medical history, and lifestyle or other factors, such as travel or where you work. What tests do I need? Screening Your health care provider may recommend screening tests for certain conditions. This may include: Lipid and cholesterol levels. Diabetes screening. This is done by checking your blood sugar (glucose) after you have not eaten for a while (fasting). Hepatitis C test. Hepatitis B test. HIV (human immunodeficiency virus) test. STI (sexually transmitted infection) testing, if you are at risk. Lung cancer screening. Colorectal cancer screening. Prostate cancer screening. Abdominal aortic aneurysm (AAA) screening. You may need this if you are a current or former smoker.  Talk with your health care provider about your test results, treatment options, and if necessary, the need for more tests. Follow these instructions at home: Eating and drinking  Eat a diet that includes fresh fruits and vegetables, whole grains, lean protein, and low-fat dairy products. Limit your intake of foods with high amounts of sugar,  saturated fats, and salt. Take vitamin and mineral supplements as recommended by your health care provider. Do not drink alcohol if your health care provider tells you not to drink. If you drink alcohol: Limit how much you have to 0-2 drinks a day. Know how much alcohol is in your drink. In the [place] one drink equals one 12 oz bottle of beer (355 mL), one 5 oz glass of wine (148 mL), or one 1 oz glass of hard liquor (44 mL). Lifestyle Brush your teeth every morning and night with fluoride toothpaste. Floss one time each day. Exercise for at least 30 minutes 5 or more days each week. Do not use any products that contain nicotine or tobacco. These products include cigarettes, chewing tobacco, and vaping devices, such as e-cigarettes. If you need help quitting, ask your health care provider. Do not use drugs. If you are sexually active, practice safe sex. Use a condom or other form of protection to prevent STIs. Take aspirin only as told by your health care provider. Make sure that you understand how much to take and what form to take. Work with your health care provider to find out whether it is safe and beneficial for you to take aspirin daily. Ask your health care provider if you need to take a cholesterol-lowering medicine (statin). Find healthy ways to manage stress, such as: Meditation, yoga, or listening to music. Journaling. Talking to a trusted person. Spending time with friends and family. Safety Always wear your seat belt while driving or riding in a vehicle. Do not drive: If you have been drinking alcohol. Do not ride with someone who has been drinking. When you are tired or distracted. While texting. If you have been using any mind-altering substances or drugs. Wear a helmet and other protective equipment during sports activities. If you have firearms in your house, make sure you follow all gun safety procedures. Minimize exposure to UV radiation to reduce your risk of skin  cancer. What's next? Visit your health care provider once a year for an annual wellness visit. Ask your health care provider how often you should have your eyes and teeth checked. Stay up to date on all vaccines. This information is not intended to replace advice given to you by your health care provider. Make sure you discuss any questions you have with your health care provider. Document Revised: 04/28/2021 Document Reviewed: 04/28/2021 Elsevier Patient Education  2024 [employer].

## 2023-06-20 NOTE — Assessment & Plan Note (Signed)
Symptoms are stable.  Follows with neurology.  On Emgality and Nurtec.

## 2023-06-20 NOTE — Progress Notes (Signed)
Chief Complaint:  Robert [name] is a 77 y.o. male who presents today for his annual comprehensive physical exam.    Assessment/Plan:  Chronic Problems Addressed Today: Type 2 diabetes mellitus with hyperglycemia ([name]) A1c 6.6.  Doing well with Synjardy 12.5 - 1000 once daily as prescribed by [place].  He will continue work on lifestyle modifications.  Follow-up in 6 months.  Hyperlipidemia associated with type 2 diabetes mellitus ([name]) Stable on Crestor 40 mg daily and Zetia 5 mg daily.  Recent lipid panel at goal.  Last LDL 58.  Will abstract this in the chart.  Hypertension associated with diabetes [facility]) Not currently on any medications.  Diastolic is on the lower side today though blood pressure overall at goal.  Will continue with home monitoring.  Migraines Symptoms are stable.  Follows with neurology.  On Emgality and Nurtec.   Preventative Healthcare: Up-to-date on screenings.  Due for flu shot soon.  No longer needs colonoscopy due to age.  Patient Counseling(The following topics were reviewed and/or handout was given):  -Nutrition: Stressed importance of moderation in sodium/caffeine intake, saturated fat and cholesterol, caloric balance, sufficient intake of fresh fruits, vegetables, and fiber.  -Stressed the importance of regular exercise.   -Substance Abuse: Discussed cessation/primary prevention of tobacco, alcohol, or other drug use; driving or other dangerous activities under the influence; availability of treatment for abuse.   -Injury prevention: Discussed safety belts, safety helmets, smoke detector, smoking near bedding or upholstery.   -Sexuality: Discussed sexually transmitted diseases, partner selection, use of condoms, avoidance of unintended pregnancy and contraceptive alternatives.   -Dental health: Discussed importance of regular tooth brushing, flossing, and dental visits.  -Health maintenance and immunizations reviewed. Please refer to Health maintenance  section.  Return to care in 1 year for next preventative visit.     Subjective:  HPI:  He has no acute complaints today.   Since our last visit he did see his doctor at the [place] and had labs drawn. We will abstract these into his chart.  His glucose was 113.  [facility] on Synjardy 12.03-999 daily.  He has been tolerating this well.  Lifestyle Diet: None specific. Trying to managing  Exercise: Trying to get back into golf     06/20/2023    8:15 AM  Depression screen PHQ 2/9  Decreased Interest 0  Down, Depressed, Hopeless 0  PHQ - 2 Score 0    Health Maintenance Due  Topic Date Due   OPHTHALMOLOGY EXAM  11/14/2019   FOOT EXAM  05/28/2020   Diabetic kidney evaluation - Urine ACR  06/16/2023     ROS: Per HPI, otherwise a complete review of systems was negative.   PMH:  The following were reviewed and entered/updated in epic: Past Medical History:  Diagnosis Date   Arthritis    CAD (coronary artery disease) 10/10/2011   NSTEMI with DES to the proximal RCA following flow wire evaluation. EF is normal.    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    "left"   Squamous cell carcinoma 12/17/2016  Stroke [facility])    found on MRI - pt unaware had stroke approx 7 years ago   Patient Active Problem List   Diagnosis Date Noted   Primary osteoarthritis of right knee 02/20/2023   OA (osteoarthritis) of knee 12/16/2022   Acute medial meniscus tear of right knee 05/30/2022   Chronic migraine without aura, with intractable migraine, so stated, with status migrainosus 09/20/2021   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 Kinnie Scales 01/07/2008   GERD 04/02/2007   Sleep apnea 04/02/2007   Past Surgical History:  Procedure Laterality Date   BACK SURGERY     CATARACT EXTRACTION Bilateral 2019   CATARACT EXTRACTION W/ INTRAOCULAR LENS IMPLANT Bilateral 01/2019   CERVICAL DISC SURGERY  1980's?   CORONARY ANGIOPLASTY WITH STENT PLACEMENT  10/13/2011   DES to the proximal RCA. Dr Sw[place]and   FRACTIONAL FLOW RESERVE WIRE Right 10/13/2011   Procedure: FRACTIONAL FLOW RESERVE WIRE;  Surgeon: Peter M Swaziland, MD;  Location: [facility] CATH LAB;  Service: Cardiovascular;  Laterality: Right;   HERNIA REPAIR     INGUINAL HERNIA REPAIR  10/23/2012   Procedure: HERNIA REPAIR INGUINAL ADULT;  Surgeon: Adolph Pollack, MD;  Location: [facility] OR;  Service: General;  Laterality: Left;  Left lower quadrant; left inguinal hernia repair with mesh   INSERTION OF MESH  10/23/2012   Procedure: INSERTION OF MESH;  Surgeon: Adolph Pollack, MD;  Location: [facility] OR;  Service: General;  Laterality: Left;  Left lower quadrant   LEFT HEART CATHETERIZATION WITH CORONARY ANGIOGRAM N/A 10/13/2011   Procedure: LEFT HEART CATHETERIZATION WITH CORONARY ANGIOGRAM;  Surgeon: Peter M Swaziland, MD;  Location: [facility] CATH LAB;  Service: Cardiovascular;  Laterality: N/A;   PERCUTANEOUS CORONARY STENT INTERVENTION (PCI-S) Right 10/13/2011   Procedure: PERCUTANEOUS CORONARY STENT INTERVENTION (PCI-S);  Surgeon: Peter M Swaziland, MD;  Location: [facility] CATH LAB;  Service: Cardiovascular;  Laterality: Right;   SHOULDER  ARTHROSCOPY W/ ROTATOR CUFF REPAIR Left 1990's   TOTAL HIP ARTHROPLASTY Left 05/14/2021   Procedure: LEFT TOTAL HIP ARTHROPLASTY ANTERIOR APPROACH;  Surgeon: Kathryne Hitch, MD;  Location: [facility] ORS;  Service: Orthopedics;  Laterality: Left;   TOTAL KNEE ARTHROPLASTY Right 02/20/2023   Procedure: TOTAL KNEE ARTHROPLASTY;  Surgeon: Ollen Gross, MD;  Location: [facility] ORS;  Service: Orthopedics;  Laterality: Right;   TRIGGER FINGER RELEASE Left    3rd and 4th digits    Family History  Problem Relation Age of Onset   Cancer Mother        lung   Heart disease Father        heart attack    Cancer Father        colon   Migraines Neg Hx    Headache Neg Hx     Medications- reviewed and updated Current Outpatient Medications  Medication Sig Dispense Refill   acetaminophen (TYLENOL) 500 MG tablet Take 1,000 mg by mouth every 6 (six) hours as needed for moderate pain.     Ascorbic Acid (VITAMIN C) 1000 MG tablet Take 1,000 mg  by mouth daily.     azelastine (ASTELIN) 0.1 % nasal spray Place 1 spray into both nostrils daily as needed for rhinitis.     Empagliflozin-metFORMIN HCl ER 12.03-999 MG TB24 Take by mouth.     B Complex-C (SUPER B COMPLEX PO) Take 1 tablet by mouth daily.     brimonidine (ALPHAGAN) 0.2 % ophthalmic solution Place 1 drop into the right eye in the morning and at bedtime.     dicyclomine (BENTYL) 10 MG capsule Take 10 mg by mouth 3 (three) times daily as needed for spasms.     ezetimibe (ZETIA) 10 MG tablet Take 5 mg by mouth daily.     fluticasone (FLONASE) 50 MCG/ACT nasal spray Place 1 spray into both nostrils daily as needed for allergies.     Fremanezumab-vfrm (AJOVY) 225 MG/1.5ML SOAJ Inject 225 mg into the skin every 30 (thirty) days. (Patient not taking: Reported on 06/20/2023) 4.5 mL 3   latanoprost (XALATAN) 0.005 % ophthalmic solution Place 1 drop into the right eye at bedtime.     Multiple Vitamins-Minerals (MULTIVITAMINS THER. W/MINERALS) TABS Take 1 tablet by  mouth daily.     nitroGLYCERIN (NITROSTAT) 0.4 MG SL tablet Place 0.4 mg under the tongue every 5 (five) minutes as needed for chest pain.     pantoprazole (PROTONIX) 40 MG tablet Take 40 mg by mouth daily.     PRESCRIPTION MEDICATION Pt has CPAP machine     Rimegepant Sulfate (NURTEC) 75 MG TBDP Take 1 tablet (75 mg total) by mouth daily as needed. For migraines. Take as close to onset of migraine as possible. One daily maximum. 10 tablet 11   rosuvastatin (CRESTOR) 40 MG tablet Take 40 mg by mouth daily.     tamsulosin (FLOMAX) 0.4 MG CAPS capsule TAKE 1 CAPSULE BY MOUTH EVERY DAY 30 capsule 1   traMADol (ULTRAM) 50 MG tablet Take 100 mg by mouth every 6 (six) hours as needed (migraines).     vitamin E 180 MG (400 UNITS) capsule Take 400 Units by mouth daily.     No current facility-administered medications for this visit.    Allergies-reviewed and updated Allergies  Allergen Reactions   Sumatriptan Shortness Of Breath    REACTION: sob, headache scalp sensitivity   Penicillins Itching    Tolerated Cephalosporin Date: 02/21/23.     Lipitor [Atorvastatin] Nausea And Vomiting   Oxycodone Itching   Simbrinza [Brinzolamide-Brimonidine]     Itching of eye    Social History   Socioeconomic History   Marital status: Married    Spouse name: Not on file   Number of children: Not on file   Years of education: Not on file   Highest education level: 12th grade  Occupational History    Comment: Retired -[employer]  Tobacco Use   Smoking status: Former    Current packs/day: 0.00    Average packs/day: 2.0 packs/day for 20.0 years (40.0 ttl pk-yrs)    Types: Cigarettes    Start date: 11/14/1962    Quit date: 11/14/1982    Years since quitting: 40.6   Smokeless tobacco: Never  Vaping Use   Vaping status: Never Used  Substance and Sexual Activity   Alcohol use: Yes    Comment: occ   Drug use: No   Sexual activity: Not on file  Other Topics Concern   Not on file  Social History Narrative    Pt lives with his wife. 1 and a half story home  No issues with  stairs. 12th grade education   Social Determinants of Health   Financial Resource Strain: Low Risk  (09/15/2022)   Overall Financial Resource Strain (CARDIA)    Difficulty of Paying Living Expenses: Not hard at all  Food Insecurity: No Food Insecurity (02/20/2023)   Hunger Vital Sign    Worried About Running Out of Food in the Last Year: Never true    Ran Out of Food in the Last Year: Never true  Transportation Needs: No Transportation Needs (02/20/2023)   PRAPARE - Administrator, Civil Service (Medical): No    Lack of Transportation (Non-Medical): No  Physical Activity: Inactive (09/15/2022)   Exercise Vital Sign    Days of Exercise per Week: 0 days    Minutes of Exercise per Session: 0 min  Stress: No Stress Concern Present (09/15/2022)   [employer] of Occupational Health - Occupational Stress Questionnaire    Feeling of Stress : Not at all  Social Connections: Moderately Integrated (09/15/2022)   Social Connection and Isolation Panel [NHANES]    Frequency of Communication with Friends and Family: Three times a week    Frequency of Social Gatherings with Friends and Family: More than three times a week    Attends Religious Services: Never    Database administrator or Organizations: Yes    Attends Banker Meetings: 1 to 4 times per year    Marital Status: Married        Objective:  Physical Exam: BP (!) 112/57   Pulse 69   Temp (!) 97.3 F (36.3 C) (Temporal)   Ht 6\' 1"  (1.854 m)   Wt 201 lb (91.2 kg)   SpO2 96%   BMI 26.52 kg/m   Body mass index is 26.52 kg/m. Wt Readings from Last 3 Encounters:  06/20/23 201 lb (91.2 kg)  04/21/23 198 lb 12.8 oz (90.2 kg)  03/28/23 198 lb (89.8 kg)   Gen: NAD, resting comfortably HEENT: TMs normal bilaterally. OP clear. No thyromegaly noted.  CV: RRR with no murmurs appreciated Pulm: NWOB, CTAB with no crackles, wheezes, or rhonchi GI:  Normal bowel sounds present. Soft, Nontender, Nondistended. MSK: no edema, cyanosis, or clubbing noted Skin: warm, dry Neuro: CN2-12 grossly intact. Strength 5/5 in upper and lower extremities. Reflexes symmetric and intact bilaterally.  Psych: Normal affect and thought content      M. Jimmey Ralph, MD 06/20/2023 8:56 AM

## 2023-06-20 NOTE — Addendum Note (Signed)
Addended by: Dyann Kief on: 06/20/2023 09:11 AM   Modules accepted: Orders

## 2023-06-22 ENCOUNTER — Encounter: Payer: Self-pay | Admitting: Family Medicine

## 2023-07-21 ENCOUNTER — Other Ambulatory Visit: Payer: Self-pay | Admitting: Family Medicine

## 2023-07-28 ENCOUNTER — Other Ambulatory Visit: Payer: Self-pay | Admitting: Family Medicine

## 2023-08-09 ENCOUNTER — Ambulatory Visit: Payer: Medicare HMO | Admitting: Adult Health

## 2023-08-13 ENCOUNTER — Other Ambulatory Visit: Payer: Self-pay | Admitting: Family Medicine

## 2023-08-25 NOTE — Progress Notes (Signed)
Robert [name] Date of Birth: 10/02/1946 Medical Record #[id number]  History of Present Illness: Robert [name] is seen today for follow up CAD.  He has had a  NSTEMI with DES to the proximal RCA in November 2012. EF was normal.  Stress Myoview in March 2015 was Normal. 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  He is doing very well on follow up. He denies any chest pain or dyspnea. He thinks he will need surgery on his other hip.  Current Outpatient Medications on File Prior to Visit  Medication Sig Dispense Refill   acetaminophen (TYLENOL) 500 MG tablet Take 1,000 mg by mouth every 6 (six) hours as needed for moderate pain.     Ascorbic Acid (VITAMIN C) 1000 MG tablet Take 1,000 mg by mouth daily.     azelastine (ASTELIN) 0.1 % nasal spray Place 1 spray into both nostrils daily as needed for rhinitis.     B Complex-C (SUPER B COMPLEX PO) Take 1 tablet by mouth daily.     brimonidine (ALPHAGAN) 0.2 % ophthalmic solution Place 1 drop into the right eye in the morning and at bedtime.     dicyclomine (BENTYL) 10 MG capsule Take 10 mg by mouth 3 (three) times daily as needed for spasms.     Empagliflozin-metFORMIN HCl ER 12.03-999 MG TB24 Take by mouth.     ezetimibe (ZETIA) 10 MG tablet Take 5 mg by mouth daily.     fluticasone (FLONASE) 50 MCG/ACT nasal spray Place 1 spray into both nostrils daily as needed for allergies.     latanoprost (XALATAN) 0.005 % ophthalmic solution Place 1 drop into the right eye at bedtime.     Multiple Vitamins-Minerals (MULTIVITAMINS THER. W/MINERALS) TABS Take 1 tablet by mouth daily.     nitroGLYCERIN (NITROSTAT) 0.4 MG SL tablet Place 0.4 mg under the tongue every 5 (five) minutes as needed for chest pain.     pantoprazole (PROTONIX) 40 MG tablet Take 40 mg by mouth daily.     PRESCRIPTION MEDICATION Pt has CPAP machine     Rimegepant Sulfate (NURTEC) 75 MG  TBDP Take 1 tablet (75 mg total) by mouth daily as needed. For migraines. Take as close to onset of migraine as possible. One daily maximum. 10 tablet 11   rosuvastatin (CRESTOR) 40 MG tablet Take 40 mg by mouth daily.     tamsulosin (FLOMAX) 0.4 MG CAPS capsule TAKE 1 CAPSULE BY MOUTH EVERY DAY 30 capsule 1   traMADol (ULTRAM) 50 MG tablet Take 100 mg by mouth every 6 (six) hours as needed (migraines).     vitamin E 180 MG (400 UNITS) capsule Take 400 Units by mouth daily.     No current facility-administered medications on file prior to visit.    Allergies  Allergen Reactions   Sumatriptan Shortness Of Breath    REACTION: sob, headache scalp sensitivity   Penicillins Itching    Tolerated Cephalosporin Date: 02/21/23.     Lipitor [Atorvastatin] Nausea And Vomiting   Oxycodone Itching   Simbrinza [Brinzolamide-Brimonidine]     Itching of eye    Past Medical History:  Diagnosis Date   Arthritis    CAD (coronary artery disease) 10/10/2011   NSTEMI with DES to the proximal RCA following flow wire evaluation. EF is normal.    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   CORONARY ANGIOPLASTY WITH STENT PLACEMENT  10/13/2011   DES to the proximal RCA. Dr Swaziland   FRACTIONAL FLOW RESERVE WIRE Right 10/13/2011   Procedure:  FRACTIONAL FLOW RESERVE WIRE;  Surgeon: Darlynn Ricco M Swaziland, MD;  Location: [facility] CATH LAB;  Service: Cardiovascular;  Laterality: Right;   HERNIA REPAIR     INGUINAL HERNIA REPAIR  10/23/2012   Procedure: HERNIA REPAIR INGUINAL ADULT;  Surgeon: Adolph Pollack, MD;  Location: [facility] OR;  Service: General;  Laterality: Left;  Left lower quadrant; left inguinal hernia repair with mesh   INSERTION OF MESH  10/23/2012   Procedure: INSERTION OF MESH;  Surgeon: Adolph Pollack, MD;  Location: [facility] OR;  Service: General;  Laterality: Left;  Left lower quadrant   LEFT HEART CATHETERIZATION WITH CORONARY ANGIOGRAM N/A 10/13/2011   Procedure: LEFT HEART CATHETERIZATION WITH CORONARY ANGIOGRAM;  Surgeon: Lakeysha Slutsky M Swaziland, MD;  Location: [facility] CATH LAB;  Service: Cardiovascular;  Laterality: N/A;   PERCUTANEOUS CORONARY STENT INTERVENTION (PCI-S) Right 10/13/2011   Procedure: PERCUTANEOUS CORONARY STENT INTERVENTION (PCI-S);  Surgeon: Riad Wagley M Swaziland, MD;  Location: [facility] CATH LAB;  Service: Cardiovascular;  Laterality: Right;   SHOULDER ARTHROSCOPY W/ ROTATOR CUFF REPAIR Left 1990's   TOTAL HIP ARTHROPLASTY Left 05/14/2021   Procedure: LEFT TOTAL HIP ARTHROPLASTY ANTERIOR APPROACH;  Surgeon: Kathryne Hitch, MD;  Location: [facility] ORS;  Service: Orthopedics;  Laterality: Left;   TOTAL KNEE ARTHROPLASTY Right 02/20/2023   Procedure: TOTAL KNEE ARTHROPLASTY;  Surgeon: Ollen Gross, MD;  Location: [facility] ORS;  Service: Orthopedics;  Laterality: Right;   TRIGGER FINGER RELEASE Left    3rd and 4th digits    Social History   Tobacco Use  Smoking Status Former   Current packs/day: 0.00   Average packs/day: 2.0 packs/day for 20.0 years (40.0 ttl pk-yrs)   Types: Cigarettes   Start date: 11/14/1962   Quit date: 11/14/1982   Years since quitting: 40.8  Smokeless Tobacco Never    Social History   Substance and Sexual Activity  Alcohol Use Yes   Comment: occ    Family History  Problem Relation Age of Onset   Cancer Mother         lung   Heart disease Father        heart attack    Cancer Father        colon   Migraines Neg Hx    Headache Neg Hx     Review of Systems: The review of systems is per the HPI.  All other systems were reviewed and are negative.  Physical Exam: BP (!) 140/60   Pulse 64   Ht 6\' 1"  (1.854 m)   Wt 196 lb 12.8 oz (89.3 kg)  SpO2 96%   BMI 25.96 kg/m  GENERAL:  Well appearing WM in NAD HEENT:  PERRL, EOMI, sclera are clear. Oropharynx is clear. NECK:  No jugular venous distention, carotid upstroke brisk and symmetric, no bruits, no thyromegaly or adenopathy LUNGS:  Clear to auscultation bilaterally CHEST:  Unremarkable HEART:  RRR,  PMI not displaced or sustained,S1 and S2 within normal limits, no S3, no S4: no clicks, no rubs, no murmurs ABD:  Soft, nontender. BS +, no masses or bruits. No hepatomegaly, no splenomegaly EXT:  2 + pulses throughout, no edema, no cyanosis no clubbing SKIN:  Warm and dry.  No rashes NEURO:  Alert and oriented x 3. Cranial nerves II through XII intact. PSYCH:  Cognitively intact     LABORATORY DATA:  Lab Results  Component Value Date   WBC 7.3 05/25/2023   HGB 13.9 05/25/2023   HCT 42 05/25/2023   PLT 186 05/25/2023   GLUCOSE 227 (H) 03/19/2023   CHOL 133 05/25/2023   TRIG 114 05/25/2023   HDL 59 05/25/2023   LDLDIRECT 137.0 03/31/2010   LDLCALC 58 05/25/2023   ALT 18 05/25/2023   AST 15 05/25/2023   NA 138 05/25/2023   K 5.2 (A) 05/25/2023   CL 104 05/25/2023   CREATININE 0.8 05/25/2023   BUN 5 05/25/2023   CO2 29 (A) 05/25/2023   TSH 2.23 05/25/2023   PSA 0.87 03/31/2010   INR 1.0 03/04/2023   HGBA1C 6.6 (A) 06/20/2023   MICROALBUR <0.7 06/15/2022   Echo 03/24/23: IMPRESSIONS     1. Left ventricular ejection fraction, by estimation, is 65 to 70%. The  left ventricle has normal function. The left ventricle has no regional  wall motion abnormalities. There is mild concentric left ventricular  hypertrophy. Left  ventricular diastolic  parameters are consistent with Grade I diastolic dysfunction (impaired  relaxation).   2. Right ventricular systolic function is normal. The right ventricular  size is normal.   3. The mitral valve is normal in structure. Trivial mitral valve  regurgitation. No evidence of mitral stenosis.   4. The aortic valve is tricuspid. There is mild calcification of the  aortic valve. Aortic valve regurgitation is not visualized. No aortic  stenosis is present.   5. The inferior vena cava is normal in size with greater than 50%  respiratory variability, suggesting right atrial pressure of 3 mmHg.     Assessment / Plan: 1. Coronary artery disease status post drug-eluting stent to the proximal RCA in November 2012. Myoview March 2015 was normal. He remains asymptomatic. Continue  statin,  metoprolol. Follow up in one year. He has deferred taking ASA.   2. Hyperlipidemia. Continue crestor and Zetia- LDL excellent 58.   3. Hypertension well controlled.  4. DM A1c 6.6%. per primary care

## 2023-08-30 ENCOUNTER — Ambulatory Visit: Payer: Medicare Other | Attending: Cardiology | Admitting: Cardiology

## 2023-08-30 ENCOUNTER — Encounter: Payer: Self-pay | Admitting: Cardiology

## 2023-08-30 VITALS — BP 140/60 | HR 64 | Ht 73.0 in | Wt 196.8 lb

## 2023-08-30 DIAGNOSIS — I2510 Atherosclerotic heart disease of native coronary artery without angina pectoris: Secondary | ICD-10-CM

## 2023-08-30 DIAGNOSIS — E785 Hyperlipidemia, unspecified: Secondary | ICD-10-CM | POA: Diagnosis not present

## 2023-08-30 DIAGNOSIS — E1159 Type 2 diabetes mellitus with other circulatory complications: Secondary | ICD-10-CM | POA: Diagnosis not present

## 2023-08-30 DIAGNOSIS — I152 Hypertension secondary to endocrine disorders: Secondary | ICD-10-CM

## 2023-08-30 NOTE — Patient Instructions (Addendum)
Medication Instructions:  NONE  *If you need a refill on your cardiac medications before your next appointment, please call your pharmacy*   Lab Work: NONE If you have labs (blood work) drawn today and your tests are completely normal, you will receive your results only by: MyChart Message (if you have MyChart) OR A paper copy in the mail If you have any lab test that is abnormal or we need to change your treatment, we will call you to review the results.   Testing/Procedures: NONE   Follow-Up: At [facility], you and your health needs are our priority.  As part of our continuing mission to provide you with exceptional heart care, we have created designated Provider Care Teams.  These Care Teams include your primary Cardiologist (physician) and Advanced Practice Providers (APPs -  Physician Assistants and Nurse Practitioners) who all work together to provide you with the care you need, when you need it.    Your next appointment:   Follow up in 6 months call in January to make a 6 month appointment  Provider:     DR.Jordan  Other Instructions NONE

## 2023-09-01 ENCOUNTER — Other Ambulatory Visit: Payer: Self-pay | Admitting: Family Medicine

## 2023-09-04 ENCOUNTER — Telehealth: Payer: Self-pay | Admitting: Physical Medicine and Rehabilitation

## 2023-09-04 NOTE — Telephone Encounter (Signed)
Pt stating symptoms are coming back causing pain, possible sciatic nerve starting in back and going down to leg please advise pt would like appt to see [facility] again

## 2023-09-05 NOTE — Telephone Encounter (Signed)
LVM to return call to clinic.

## 2023-09-08 ENCOUNTER — Telehealth: Payer: Self-pay | Admitting: Physical Medicine and Rehabilitation

## 2023-09-08 NOTE — Telephone Encounter (Signed)
Patient returned call asked for a call back.  Pt # [phone]

## 2023-09-11 NOTE — Telephone Encounter (Signed)
Spoke with patient and scheduled OV for 09/14/23. Last injection 07/12/21

## 2023-09-14 ENCOUNTER — Ambulatory Visit: Payer: Medicare Other | Admitting: Physical Medicine and Rehabilitation

## 2023-09-14 ENCOUNTER — Encounter: Payer: Self-pay | Admitting: Physical Medicine and Rehabilitation

## 2023-09-14 DIAGNOSIS — M5416 Radiculopathy, lumbar region: Secondary | ICD-10-CM

## 2023-09-14 DIAGNOSIS — M5116 Intervertebral disc disorders with radiculopathy, lumbar region: Secondary | ICD-10-CM

## 2023-09-14 DIAGNOSIS — G8929 Other chronic pain: Secondary | ICD-10-CM

## 2023-09-14 NOTE — Progress Notes (Signed)
Robert [name] - 77 y.o. male MRN [id number]  Date of birth: 02/01/1946  Office Visit Note: Visit Date: 09/14/2023 PCP: Ardith Dark, MD Referred by: Ardith Dark, MD  Subjective: Chief Complaint  Patient presents with   Lower Back - Pain   HPI: Robert [name] is a 77 y.o. male who comes in today for evaluation of chronic, worsening and severe left sided lower back pain radiating to buttock and down lateral hip to posterior thigh. Pain ongoing for several years, worsened over the last 3 weeks. Prolonged sitting seems to exacerbate his pain. He describes pain as sharp sensation, currently rates as 7 out of 10. Some relief of pain with home exercise regimen, rest and use of medications. No history of formal physical therapy. Lumbar MRI imaging from 2023 exhibits L4-L5 left  lateral disc protrusion, contacting left L4 nerve root. There is also narrowing of the left lateral recess at this level. No high grade spinal canal stenosis noted. Patient underwent left L4 transforaminal epidural steroid injection in our office on 07/12/2021, he reports significant relief of pain for several years. Patient denies focal weakness, numbness and tingling. No recent trauma or falls.     Review of Systems  Musculoskeletal:  Positive for back pain.  Neurological:  Negative for tingling, sensory change, focal weakness and weakness.  All other systems reviewed and are negative.  Otherwise per HPI.  Assessment & Plan: Visit Diagnoses:    ICD-10-CM   1. Chronic left-sided low back pain with left-sided sciatica  M54.42    G89.29     2. Lumbar radiculopathy  M54.16 Ambulatory referral to Physical Medicine Rehab    3. Intervertebral disc disorders with radiculopathy, lumbar region  M51.16 Ambulatory referral to Physical Medicine Rehab       Plan: Findings:  Chronic, worsening and severe left sided lower back pain radiating to buttock and down lateral hip to posterior thigh. Patient continues to have severe  pain despite good conservative therapies such as home exercise regimen, rest and use of medications. Patients clinical presentation and exam are consistent with lumbar radiculopathy, more of L5 nerve pattern, there is transitional segment and left extreme lateral disc protrusion at L4-L5. Next step is to perform diagnostic and hopefully left L4 transforaminal epidural steroid injection under fluoroscopic guidance. If good relief of pain with injection we can repeat this procedure infrequently as needed. Patient has no questions regarding injection procedure. No red flag symptoms noted upon exam today.     Meds & Orders: No orders of the defined types were placed in this encounter.   Orders Placed This Encounter  Procedures   Ambulatory referral to Physical Medicine Rehab    Follow-up: Return for Left L4 transforaminal epidural steroid injection.   Procedures: No procedures performed      Clinical History: Narrative & Impression CLINICAL DATA:  Low back pain, right radicular symptoms   EXAM: MRI LUMBAR SPINE WITHOUT CONTRAST   TECHNIQUE: Multiplanar, multisequence MR imaging of the lumbar spine was performed. No intravenous contrast was administered.   COMPARISON:  04/24/2021 MRI lumbar spine   FINDINGS: Segmentation: 5 lumbar-type vertebral bodies. 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   Alignment:  No significant listhesis.   Vertebrae:  No acute fracture or suspicious osseous lesion.   Conus medullaris and cauda equina: Conus extends to the L1 level. Conus and cauda equina appear normal.   Paraspinal and other soft tissues: Negative.   Disc levels:  T12-L1: Seen only on the sagittal images. No significant disc bulge, spinal canal stenosis, or neural foraminal narrowing.   L1-L2: No significant disc bulge. No spinal canal stenosis or neural foraminal narrowing.   L2-L3: Minimal disc bulge. No spinal canal stenosis or neural foraminal  narrowing.   L3-L4: Mild disc bulge. Mild narrowing of the lateral recesses. No spinal canal stenosis or neural foraminal narrowing.   L4-L5: Mild disc bulge with unchanged left extreme lateral disc protrusion, which likely abuts the exiting left L4 nerve. Narrowing of the left lateral recess. No spinal canal stenosis or neural foraminal narrowing.   L5-S1: Small disc bulge. No spinal canal stenosis. Mild bilateral neural foraminal narrowing, unchanged.   IMPRESSION: 1. L5-S1 mild bilateral neural foraminal narrowing. 2. L4-L5 left extreme lateral disc protrusion, which likely abuts the exiting left L4 nerve. Narrowing of the left lateral recess at this level could affect descending left L5 nerve roots. 3. L3-L4 narrowing of the lateral recesses, which could affect the descending L4 nerve roots. 4. Transitional anatomy, with partial lumbarization of S1. Please correlate with imaging if any intervention is planned.     Electronically Signed   By: Wiliam Ke M.D.   On: 03/14/2022 01:02   He reports that he quit smoking about 40 years ago. His smoking use included cigarettes. He started smoking about 60 years ago. He has a 40 pack-year smoking history. He has never used smokeless tobacco.  Recent Labs    01/30/23 1518 02/07/23 0929 06/20/23 0911  HGBA1C 7.6* 7.4* 6.6*    Objective:  VS:  HT:    WT:   BMI:     BP:   HR: bpm  TEMP: ( )  RESP:  Physical Exam Vitals and nursing note reviewed.  HENT:     Head: Normocephalic and atraumatic.     Right Ear: External ear normal.     Left Ear: External ear normal.     Nose: Nose normal.     Mouth/Throat:     Mouth: Mucous membranes are moist.  Eyes:     Extraocular Movements: Extraocular movements intact.  Cardiovascular:     Rate and Rhythm: Normal rate.     Pulses: Normal pulses.  Pulmonary:     Effort: Pulmonary effort is normal.  Abdominal:     General: Abdomen is flat. There is no distension.  Musculoskeletal:         General: Tenderness present.     Cervical back: Normal range of motion.     Comments: Patient rises from seated position to standing without difficulty. Good lumbar range of motion. No pain noted with facet loading. 5/5 strength noted with bilateral hip flexion, knee flexion/extension, ankle dorsiflexion/plantarflexion and EHL. No clonus noted bilaterally. No pain upon palpation of greater trochanters. No pain with internal/external rotation of bilateral hips. Sensation intact bilaterally. Dysesthesias noted to left L5 dermatome. Negative slump test bilaterally. Ambulates without aid, gait steady.     Skin:    General: Skin is warm and dry.     Capillary Refill: Capillary refill takes less than 2 seconds.  Neurological:     General: No focal deficit present.     Mental Status: He is alert and oriented to person, place, and time.  Psychiatric:        Mood and Affect: Mood normal.        Behavior: Behavior normal.     Ortho Exam  Imaging: No results found.  Past Medical/Family/Surgical/Social History: Medications & Allergies reviewed  per EMR, new medications updated. Patient Active Problem List   Diagnosis Date Noted   Primary osteoarthritis of right knee 02/20/2023   OA (osteoarthritis) of knee 12/16/2022   Acute medial meniscus tear of right knee 05/30/2022   Chronic migraine without aura, with intractable migraine, so stated, with status migrainosus 09/20/2021   Status post total replacement of left hip 05/14/2021   Type 2 diabetes mellitus with hyperglycemia ([name]) 05/23/2018   Chronically dry eyes, bilateral 05/23/2018   Migraines 11/28/2016   Hypertension associated with diabetes ([name]) 10/11/2011   History of basal cell carcinoma (BCC), right ear 03/03/2009   Hyperlipidemia associated with type 2 diabetes mellitus ([name]) 01/11/2008   Allergic rhinitis 01/07/2008   Prostatitis, recurrent 01/07/2008   History of gastric ulcer 01/07/2008   History of colon polyps,  followed by Dr. Kinnie Scales 01/07/2008   GERD 04/02/2007   Sleep apnea 04/02/2007   Past Medical History:  Diagnosis Date   Arthritis    CAD (coronary artery disease) 10/10/2011   NSTEMI with DES to the proximal RCA following flow wire evaluation. EF is normal.    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   CORONARY ANGIOPLASTY WITH STENT PLACEMENT  10/13/2011   DES to the proximal RCA. Dr Swaziland   FRACTIONAL FLOW RESERVE WIRE Right 10/13/2011   Procedure: FRACTIONAL FLOW RESERVE WIRE;  Surgeon: Peter M Swaziland, MD;  Location: [facility] CATH LAB;  Service: Cardiovascular;  Laterality: Right;   HERNIA REPAIR     INGUINAL HERNIA REPAIR  10/23/2012   Procedure: HERNIA REPAIR INGUINAL ADULT;  Surgeon: Adolph Pollack, MD;  Location: [facility] OR;  Service: General;  Laterality: Left;  Left lower quadrant; left inguinal hernia repair with mesh   INSERTION OF MESH  10/23/2012   Procedure: INSERTION OF MESH;  Surgeon: Adolph Pollack, MD;  Location: [facility] OR;  Service: General;  Laterality: Left;  Left lower quadrant   LEFT HEART CATHETERIZATION WITH CORONARY  ANGIOGRAM N/A 10/13/2011   Procedure: LEFT HEART CATHETERIZATION WITH CORONARY ANGIOGRAM;  Surgeon: Peter M Swaziland, MD;  Location: [facility] CATH LAB;  Service: Cardiovascular;  Laterality: N/A;   PERCUTANEOUS CORONARY STENT INTERVENTION (PCI-S) Right 10/13/2011   Procedure: PERCUTANEOUS CORONARY STENT INTERVENTION (PCI-S);  Surgeon: Peter M Swaziland, MD;  Location: [facility] CATH LAB;  Service: Cardiovascular;  Laterality: Right;  SHOULDER ARTHROSCOPY W/ ROTATOR CUFF REPAIR Left 1990's   TOTAL HIP ARTHROPLASTY Left 05/14/2021   Procedure: LEFT TOTAL HIP ARTHROPLASTY ANTERIOR APPROACH;  Surgeon: Kathryne Hitch, MD;  Location: [facility] ORS;  Service: Orthopedics;  Laterality: Left;   TOTAL KNEE ARTHROPLASTY Right 02/20/2023   Procedure: TOTAL KNEE ARTHROPLASTY;  Surgeon: Ollen Gross, MD;  Location: [facility] ORS;  Service: Orthopedics;  Laterality: Right;   TRIGGER FINGER RELEASE Left    3rd and 4th digits   Social History   Occupational History    Comment: Retired -[employer]  Tobacco Use   Smoking status: Former    Current packs/day: 0.00    Average packs/day: 2.0 packs/day for 20.0 years (40.0 ttl pk-yrs)    Types: Cigarettes    Start date: 11/14/1962    Quit date: 11/14/1982    Years since quitting: 40.8   Smokeless tobacco: Never  Vaping Use   Vaping status: Never Used  Substance and Sexual Activity   Alcohol use: Yes    Comment: occ   Drug use: No   Sexual activity: Not on file

## 2023-09-14 NOTE — Progress Notes (Signed)
He had an injection about 2 years ago and it helped so much.  Until now the pain Is back he [place] a lot of [place] the beach and it causes the most pain when he Is sitting down.

## 2023-09-17 ENCOUNTER — Other Ambulatory Visit: Payer: Self-pay | Admitting: Family Medicine

## 2023-09-21 ENCOUNTER — Other Ambulatory Visit: Payer: Self-pay | Admitting: Family Medicine

## 2023-09-21 ENCOUNTER — Ambulatory Visit: Payer: Medicare Other

## 2023-09-21 VITALS — Wt 196.0 lb

## 2023-09-21 DIAGNOSIS — Z0000 Encounter for general adult medical examination without abnormal findings: Secondary | ICD-10-CM | POA: Diagnosis not present

## 2023-09-21 NOTE — Patient Instructions (Signed)
Mr. Mclaren , Thank you for taking time to come for your Medicare Wellness Visit. I appreciate your ongoing commitment to your health goals. Please review the following plan we discussed and let me know if I can assist you in the future.   Referrals/Orders/Follow-Ups/Clinician Recommendations: maintain health and activity   This is a list of the screening recommended for you and due dates:  Health Maintenance  Topic Date Due   Eye exam for diabetics  11/14/2019   Complete foot exam   05/28/2020   Medicare Annual Wellness Visit  09/16/2023   Hemoglobin A1C  12/21/2023   Yearly kidney function blood test for diabetes  05/24/2024   Yearly kidney health urinalysis for diabetes  05/24/2024   DTaP/Tdap/Td vaccine (4 - Td or Tdap) 12/01/2027   Pneumonia Vaccine  Completed   Hepatitis C Screening  Completed   Zoster (Shingles) Vaccine  Completed   HPV Vaccine  Aged Out   Flu Shot  Discontinued   Colon Cancer Screening  Discontinued   COVID-19 Vaccine  Discontinued    Advanced directives: (Copy Requested) Please bring a copy of your health care power of attorney and living will to the office to be added to your chart at your convenience.  Next Medicare Annual Wellness Visit scheduled for next year: Yes

## 2023-09-21 NOTE — Progress Notes (Signed)
Subjective:   Robert [name] is a 77 y.o. male who presents for Medicare Annual/Subsequent preventive examination.  Visit Complete: Virtual I connected with  Minette Brine on 09/21/23 by a audio enabled telemedicine application and verified that I am speaking with the correct person using two identifiers.  Patient Location: Home  Provider Location: Office/Clinic  I discussed the limitations of evaluation and management by telemedicine. The patient expressed understanding and agreed to proceed.  Vital Signs: Because this visit was a virtual/telehealth visit, some criteria may be missing or patient reported. Any vitals not documented were not able to be obtained and vitals that have been documented are patient reported.  Cardiac Risk Factors include: advanced age (>77men, >41 women);male gender;hypertension;dyslipidemia;diabetes mellitus     Objective:    Today's Vitals   09/21/23 0948  Weight: 196 lb (88.9 kg)   Body mass index is 25.86 kg/m.     09/21/2023    9:53 AM 03/19/2023    4:10 PM 03/04/2023   12:57 PM 02/20/2023    6:20 PM 02/07/2023    9:03 AM 12/07/2022    8:05 AM 09/15/2022    9:28 AM  Advanced Directives  Does Patient Have a Medical Advance Directive? Yes Yes Yes Yes Yes Yes Yes  Type of Estate agent of [place];Living will   Healthcare Power of [employer] of [place];Living will Healthcare Power of [place];Living will Healthcare Power of [place];Living will  Does patient want to make changes to medical advance directive?    No - Patient declined  No - Patient declined   Copy of Healthcare Power of Attorney in Chart? No - copy requested      No - copy requested    Current Medications (verified) Outpatient Encounter Medications as of 09/21/2023  Medication Sig   acetaminophen (TYLENOL) 500 MG tablet Take 1,000 mg by mouth every 6 (six) hours as needed for moderate pain.   Ascorbic Acid (VITAMIN C) 1000 MG tablet Take 1,000 mg by  mouth daily.   azelastine (ASTELIN) 0.1 % nasal spray Place 1 spray into both nostrils daily as needed for rhinitis.   B Complex-C (SUPER B COMPLEX PO) Take 1 tablet by mouth daily.   brimonidine (ALPHAGAN) 0.2 % ophthalmic solution Place 1 drop into the right eye in the morning and at bedtime.   dicyclomine (BENTYL) 10 MG capsule Take 10 mg by mouth 3 (three) times daily as needed for spasms.   Empagliflozin-metFORMIN HCl ER 12.03-999 MG TB24 Take by mouth.   ezetimibe (ZETIA) 10 MG tablet Take 5 mg by mouth daily.   fluticasone (FLONASE) 50 MCG/ACT nasal spray Place 1 spray into both nostrils daily as needed for allergies.   latanoprost (XALATAN) 0.005 % ophthalmic solution Place 1 drop into the right eye at bedtime.   Multiple Vitamins-Minerals (MULTIVITAMINS THER. W/MINERALS) TABS Take 1 tablet by mouth daily.   nitroGLYCERIN (NITROSTAT) 0.4 MG SL tablet Place 0.4 mg under the tongue every 5 (five) minutes as needed for chest pain.   pantoprazole (PROTONIX) 40 MG tablet Take 40 mg by mouth daily.   PRESCRIPTION MEDICATION Pt has CPAP machine   Rimegepant Sulfate (NURTEC) 75 MG TBDP Take 1 tablet (75 mg total) by mouth daily as needed. For migraines. Take as close to onset of migraine as possible. One daily maximum.   rosuvastatin (CRESTOR) 40 MG tablet Take 40 mg by mouth daily.   tamsulosin (FLOMAX) 0.4 MG CAPS capsule TAKE 1 CAPSULE BY MOUTH EVERY DAY  traMADol (ULTRAM) 50 MG tablet Take 100 mg by mouth every 6 (six) hours as needed (migraines).   vitamin E 180 MG (400 UNITS) capsule Take 400 Units by mouth daily.   No facility-administered encounter medications on file as of 09/21/2023.    Allergies (verified) Sumatriptan, Penicillins, Lipitor [atorvastatin], Oxycodone, and Simbrinza [brinzolamide-brimonidine]   History: Past Medical History:  Diagnosis Date   Arthritis    CAD (coronary artery disease) 10/10/2011   NSTEMI with DES to the proximal RCA following flow wire  evaluation. EF is normal.    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   CORONARY ANGIOPLASTY WITH STENT PLACEMENT  10/13/2011   DES to the proximal RCA. Dr Swaziland   FRACTIONAL FLOW RESERVE WIRE Right 10/13/2011   Procedure: FRACTIONAL FLOW RESERVE WIRE;  Surgeon: Peter M Swaziland, MD;  Location[facility] CATH LAB;  Service: Cardiovascular;  Laterality: Right;   HERNIA REPAIR     INGUINAL HERNIA REPAIR  10/23/2012   Procedure: HERNIA REPAIR INGUINAL ADULT;  Surgeon: Adolph Pollack, MD;  Location[facility] OR;  Service: General;  Laterality: Left;  Left lower quadrant; left inguinal hernia repair with mesh   INSERTION OF MESH  10/23/2012   Procedure: INSERTION OF MESH;  Surgeon: Adolph Pollack, MD;  Location[facility] OR;  Service: General;  Laterality: Left;  Left lower quadrant   LEFT HEART CATHETERIZATION WITH CORONARY ANGIOGRAM N/A 10/13/2011   Procedure: LEFT HEART CATHETERIZATION WITH CORONARY ANGIOGRAM;  Surgeon: Peter M Swaziland, MD;  Location: [facility] CATH LAB;  Service: Cardiovascular;  Laterality: N/A;   PERCUTANEOUS CORONARY STENT INTERVENTION (PCI-S) Right 10/13/2011   Procedure: PERCUTANEOUS CORONARY STENT INTERVENTION (PCI-S);  Surgeon: Peter M Swaziland, MD;  Location: [facility] CATH LAB;  Service: Cardiovascular;  Laterality: Right;   SHOULDER  ARTHROSCOPY W/ ROTATOR CUFF REPAIR Left 1990's   TOTAL HIP ARTHROPLASTY Left 05/14/2021   Procedure: LEFT TOTAL HIP ARTHROPLASTY ANTERIOR APPROACH;  Surgeon: Kathryne Hitch, MD;  Location: [facility] ORS;  Service: Orthopedics;  Laterality: Left;   TOTAL KNEE ARTHROPLASTY Right 02/20/2023   Procedure: TOTAL KNEE ARTHROPLASTY;  Surgeon: Ollen Gross, MD;  Location: [facility] ORS;  Service: Orthopedics;  Laterality: Right;   TRIGGER FINGER RELEASE Left    3rd and 4th digits   Family History  Problem Relation Age of Onset   Cancer Mother        lung   Heart disease Father        heart attack    Cancer Father        colon   Migraines Neg Hx    Headache Neg Hx    Social History   Socioeconomic History   Marital status: Married  Spouse name: Not on file   Number of children: Not on file   Years of education: Not on file   Highest education level: 12th grade  Occupational History    Comment: Retired -[employer]  Tobacco Use   Smoking status: Former    Current packs/day: 0.00    Average packs/day: 2.0 packs/day for 20.0 years (40.0 ttl pk-yrs)    Types: Cigarettes    Start date: 11/14/1962    Quit date: 11/14/1982    Years since quitting: 40.8   Smokeless tobacco: Never  Vaping Use   Vaping status: Never Used  Substance and Sexual Activity   Alcohol use: Yes    Comment: occ   Drug use: No   Sexual activity: Not on file  Other Topics Concern   Not on file  Social History Narrative   Pt lives with his wife. 1 and a half story home  No issues with stairs. 12th grade education   Social Determinants of Health   Financial Resource Strain: Low Risk  (09/21/2023)   Overall Financial Resource Strain (CARDIA)    Difficulty of Paying Living Expenses: Not hard at all  Food Insecurity: No Food Insecurity (09/21/2023)   Hunger Vital Sign    Worried About Running Out of Food in the Last Year: Never true    Ran Out of Food in the Last Year: Never true  Transportation Needs: No Transportation Needs  (09/21/2023)   PRAPARE - Administrator, Civil Service (Medical): No    Lack of Transportation (Non-Medical): No  Physical Activity: Inactive (09/21/2023)   Exercise Vital Sign    Days of Exercise per Week: 0 days    Minutes of Exercise per Session: 0 min  Stress: No Stress Concern Present (09/21/2023)   [employer] of Occupational Health - Occupational Stress Questionnaire    Feeling of Stress : Not at all  Social Connections: Moderately Integrated (09/21/2023)   Social Connection and Isolation Panel [NHANES]    Frequency of Communication with Friends and Family: More than three times a week    Frequency of Social Gatherings with Friends and Family: More than three times a week    Attends Religious Services: Never    Database administrator or Organizations: Yes    Attends Engineer, structural: 1 to 4 times per year    Marital Status: Married    Tobacco Counseling Counseling given: Not Answered   Clinical Intake:  Pre-visit preparation completed: Yes  Pain : No/denies pain     BMI - recorded: 25.86 Nutritional Status: BMI 25 -29 Overweight Diabetes: Yes CBG done?: No Did pt. bring in CBG monitor from home?: No  How often do you need to have someone help you when you read instructions, pamphlets, or other written materials from your doctor or pharmacy?: 1 - Never  Interpreter Needed?: No  Information entered by :: Lanier Ensign, LPN   Activities of Daily Living    09/21/2023    9:49 AM 02/20/2023    6:20 PM  In your present state of health, do you have any difficulty performing the following activities:  Hearing? 1 1  Comment has hearing aids   Vision? 0 0  Difficulty concentrating or making decisions? 0 0  Walking or climbing stairs? 0 0  Dressing or bathing? 0 0  Doing errands, shopping? 0 0  Preparing Food and eating ? N   Using the Toilet? N   In the past six months, have you accidently  leaked urine? N   Do you have problems with  loss of bowel control? N   Managing your Medications? N   Managing your Finances? N   Housekeeping or managing your Housekeeping? N     Patient Care Team: Ardith Dark, MD as PCP - General (Family Medicine) [place], Peter M, MD as PCP - Cardiology (Cardiology) [facility] as Referring Physician (General Practice) Shirlean Kelly, MD as Consulting Physician (Neurosurgery) [place], Peter M, MD as Consulting Physician (Cardiology) Blima Ledger, OD (Optometry) [place], Rita Ohara, [facility] (Inactive) (Pharmacist)  Indicate any recent Medical Services you may have received from other than [facility] providers in the past year (date may be approximate).     Assessment:   This is a routine wellness examination for [place].  Hearing/Vision screen Hearing Screening - Comments:: Pt has hearing aids  [facility]    Goals Addressed             This Visit's Progress    Patient Stated       Maintain health and activity        Depression Screen    09/21/2023    9:54 AM 06/20/2023    8:15 AM 01/30/2023    2:27 PM 12/16/2022    8:09 AM 09/20/2022    8:18 AM 09/15/2022    9:27 AM 06/15/2022    8:18 AM  PHQ 2/9 Scores  PHQ - 2 Score 0 0 0 0 0 0 0    Fall Risk    09/21/2023    9:55 AM 06/20/2023    8:15 AM 01/30/2023    2:27 PM 12/16/2022    8:09 AM 09/20/2022    8:14 AM  Fall Risk   Falls in the past year? 1 0  0 0  Number falls in past yr: 1 0 0 0 0  Injury with Fall? 1 0 0 0 0  Comment sore      Risk for fall due to : Impaired vision;History of fall(s) No Fall Risks No Fall Risks No Fall Risks No Fall Risks  Follow up Falls prevention discussed        MEDICARE RISK AT HOME: Medicare Risk at Home Any stairs in or around the home?: Yes If so, are there any without handrails?: No Home free of loose throw rugs in walkways, pet beds, electrical cords, etc?: Yes Adequate lighting in your home to reduce risk of falls?: Yes Life alert?: No Use  of a cane, walker or w/c?: No Grab bars in the bathroom?: Yes Shower chair or bench in shower?: No Elevated toilet seat or a handicapped toilet?: No  TIMED UP AND GO:  Was the test performed?  No    Cognitive Function:    05/29/2019    9:22 AM 05/03/2017    8:09 AM  MMSE - Mini Mental State Exam  Orientation to time 5 5  Orientation to Place 5 5  Registration 3 3  Attention/ Calculation 5 5  Recall 3 3  Language- name 2 objects  2  Language- repeat 1 1  Language- follow 3 step command 3 3  Language- read & follow direction 1 1  Write a sentence  1  Copy design  1  Total score  30        09/21/2023    9:56 AM 09/15/2022    9:31 AM 09/02/2021    8:18 AM  6CIT Screen  What Year? 0 points 0  points 0 points  What month? 0 points 0 points 0 points  What time? 0 points 0 points 0 points  Count back from 20 0 points 0 points 0 points  Months in reverse 0 points 0 points 0 points  Repeat phrase 0 points 0 points 4 points  Total Score 0 points 0 points 4 points    Immunizations Immunization History  Administered Date(s) Administered   Fluad Quad(high Dose 65+) 10/06/2020, 09/02/2021, 09/28/2022   Fluad Trivalent(High Dose 65+) 09/12/2023   Influenza Whole 11/14/2005   Influenza, High Dose Seasonal PF 08/10/2012, 09/12/2013, 08/28/2015, 10/19/2016, 09/18/2017, 10/21/2017, 09/12/2018, 09/24/2019   Influenza-Unspecified 09/14/2010, 09/15/2011, 07/15/2013, 10/01/2014, 10/14/2016   Moderna Sars-Covid-2 Vaccination 11/29/2019, 12/27/2019, 09/01/2020   Pneumococcal Conjugate-13 05/06/2014   Pneumococcal Polysaccharide-23 03/03/2009   Pneumococcal-Unspecified 05/14/2010   Td 10/14/2006   Tdap 11/14/2009, 11/30/2017   Zoster Recombinant(Shingrix) 08/30/2017, 02/12/2018, 04/14/2018   Zoster, Live 10/28/2009    TDAP status: Up to date  Flu Vaccine status: Up to date  Pneumococcal vaccine status: Up to date  Covid-19 vaccine status: Declined, Education has been provided  regarding the importance of this vaccine but patient still declined. Advised may receive this vaccine at local pharmacy or Health Dept.or vaccine clinic. Aware to provide a copy of the vaccination record if obtained from local pharmacy or Health Dept. Verbalized acceptance and understanding.  Qualifies for Shingles Vaccine? Yes   Zostavax completed Yes   Shingrix Completed?: Yes  Screening Tests Health Maintenance  Topic Date Due   OPHTHALMOLOGY EXAM  11/14/2019   FOOT EXAM  05/28/2020   HEMOGLOBIN A1C  12/21/2023   Diabetic kidney evaluation - eGFR measurement  05/24/2024   Diabetic kidney evaluation - Urine ACR  05/24/2024   Medicare Annual Wellness (AWV)  09/20/2024   DTaP/Tdap/Td (4 - Td or Tdap) 12/01/2027   Pneumonia Vaccine 74+ Years old  Completed   Hepatitis C Screening  Completed   Zoster Vaccines- Shingrix  Completed   HPV VACCINES  Aged Out   INFLUENZA VACCINE  Discontinued   Colonoscopy  Discontinued   COVID-19 Vaccine  Discontinued    Health Maintenance  Health Maintenance Due  Topic Date Due   OPHTHALMOLOGY EXAM  11/14/2019   FOOT EXAM  05/28/2020    Colorectal cancer screening: No longer required.   Additional Screening:  Hepatitis C Screening:  Completed 05/16/18  Vision Screening: Recommended annual ophthalmology exams for early detection of glaucoma and other disorders of the eye. Is the patient up to date with their annual eye exam?  No  Who is the provider or what is the name of the office in which the patient attends annual [facility] exams? [facility] If pt is not established with a provider, would they like to be referred to a provider to establish care? No .   Dental Screening: Recommended annual dental exams for proper oral hygiene  Diabetic Foot Exam: Diabetic Foot Exam: Overdue, Pt has been advised about the importance in completing this exam. Pt is scheduled for diabetic foot exam on next appt .  Community Resource Referral / Chronic Care Management: CRR  required this visit?  No   CCM required this visit?  No     Plan:     I have personally reviewed and noted the following in the patient's chart:   Medical and social history Use of alcohol, tobacco or illicit drugs  Current medications and supplements including opioid prescriptions. Patient is not currently taking opioid prescriptions. Functional ability and status Nutritional status  Physical activity Advanced directives List of other physicians Hospitalizations, surgeries, and ER visits in previous 12 months Vitals Screenings to include cognitive, depression, and falls Referrals and appointments  In addition, I have reviewed and discussed with patient certain preventive protocols, quality metrics, and best practice recommendations. A written personalized care plan for preventive services as well as general preventive health recommendations were provided to patient.     Marzella Schlein, LPN   16/11/958   After Visit Summary: (MyChart) Due to this being a telephonic visit, the after visit summary with patients personalized plan was offered to patient via MyChart   Nurse Notes: none

## 2023-09-25 ENCOUNTER — Ambulatory Visit: Payer: Medicare Other | Admitting: Physical Medicine and Rehabilitation

## 2023-09-25 ENCOUNTER — Other Ambulatory Visit: Payer: Self-pay

## 2023-09-25 ENCOUNTER — Encounter: Payer: Self-pay | Admitting: Physical Medicine and Rehabilitation

## 2023-09-25 DIAGNOSIS — M5416 Radiculopathy, lumbar region: Secondary | ICD-10-CM | POA: Diagnosis not present

## 2023-09-25 MED ORDER — METHYLPREDNISOLONE ACETATE 40 MG/ML IJ SUSP
40.0000 mg | Freq: Once | INTRAMUSCULAR | Status: AC
Start: 2023-09-25 — End: 2023-09-25
  Administered 2023-09-25: 40 mg

## 2023-09-25 NOTE — Progress Notes (Signed)
BECKET CIANCIULLI - 77 y.o. male MRN [id number]  Date of birth: 12-17-1945  Office Visit Note: Visit Date: 09/25/2023 PCP: Ardith Dark, MD Referred by: Ardith Dark, MD  Subjective: Chief Complaint  Patient presents with   Lower Back - Pain   HPI:  CARNEL TORNES is a 77 y.o. male who comes in today at the request of Ellin Goodie, FNP and Dr. Doneen Poisson for planned Left L4-5 Lumbar Transforaminal epidural steroid injection with fluoroscopic guidance.  The patient has failed conservative care including home exercise, medications, time and activity modification.  This injection will be diagnostic and hopefully therapeutic.  Please see requesting physician notes for further details and justification. Lumbarized S1 segment.   ROS Otherwise per HPI.  Assessment & Plan: Visit Diagnoses:    ICD-10-CM   1. Lumbar radiculopathy  M54.16 XR C-ARM NO REPORT    Epidural Steroid injection    methylPREDNISolone acetate (DEPO-MEDROL) injection 40 mg      Plan: No additional findings.   Meds & Orders:  Meds ordered this encounter  Medications   methylPREDNISolone acetate (DEPO-MEDROL) injection 40 mg    Orders Placed This Encounter  Procedures   XR C-ARM NO REPORT   Epidural Steroid injection    Follow-up: Return for visit to requesting provider as needed.   Procedures: No procedures performed  Lumbosacral Transforaminal Epidural Steroid Injection - Sub-Pedicular Approach with Fluoroscopic Guidance  Patient: SALOME MASSENA      Date of Birth: 06-13-1946 MRN: [id number] PCP: Ardith Dark, MD      Visit Date: 09/25/2023   Universal Protocol:    Date/Time: 09/25/2023  Consent Given By: the patient  Position: PRONE  Additional Comments: Vital signs were monitored before and after the procedure. Patient was prepped and draped in the usual sterile fashion. The correct patient, procedure, and site was verified.   Injection Procedure Details:   Procedure diagnoses:  Lumbar radiculopathy [M54.16]    Meds Administered:  Meds ordered this encounter  Medications   methylPREDNISolone acetate (DEPO-MEDROL) injection 40 mg    Laterality: Left  Location/Site: L4, Transitional S1 segment  Needle:5.0 in., 22 ga.  Short bevel or Quincke spinal needle  Needle Placement: Transforaminal  Findings:    -Comments: Excellent flow of contrast along the nerve, nerve root and into the epidural space.  Procedure Details: After squaring off the end-plates to get a true AP view, the C-arm was positioned so that an oblique view of the foramen as noted above was visualized. The target area is just inferior to the "nose of the scotty dog" or sub pedicular. The soft tissues overlying this structure were infiltrated with 2-3 ml. of 1% Lidocaine without Epinephrine.  The spinal needle was inserted toward the target using a "trajectory" view along the fluoroscope beam.  Under AP and lateral visualization, the needle was advanced so it did not puncture dura and was located close the 6 O'Clock position of the pedical in AP tracterory. Biplanar projections were used to confirm position. Aspiration was confirmed to be negative for CSF and/or blood. A 1-2 ml. volume of Isovue-250 was injected and flow of contrast was noted at each level. Radiographs were obtained for documentation purposes.   After attaining the desired flow of contrast documented above, a 0.5 to 1.0 ml test dose of 0.25% Marcaine was injected into each respective transforaminal space.  The patient was observed for 90 seconds post injection.  After no sensory deficits were reported, and normal lower  extremity motor function was noted,   the above injectate was administered so that equal amounts of the injectate were placed at each foramen (level) into the transforaminal epidural space.   Additional Comments:  No complications occurred Dressing: 2 x 2 sterile gauze and Band-Aid    Post-procedure details: Patient  was observed during the procedure. Post-procedure instructions were reviewed.  Patient left the clinic in stable condition.    Clinical History: Narrative & Impression CLINICAL DATA:  Low back pain, right radicular symptoms   EXAM: MRI LUMBAR SPINE WITHOUT CONTRAST   TECHNIQUE: Multiplanar, multisequence MR imaging of the lumbar spine was performed. No intravenous contrast was administered.   COMPARISON:  04/24/2021 MRI lumbar spine   FINDINGS: Segmentation: 5 lumbar-type vertebral bodies. 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   Alignment:  No significant listhesis.   Vertebrae:  No acute fracture or suspicious osseous lesion.   Conus medullaris and cauda equina: Conus extends to the L1 level. Conus and cauda equina appear normal.   Paraspinal and other soft tissues: Negative.   Disc levels:   T12-L1: Seen only on the sagittal images. No significant disc bulge, spinal canal stenosis, or neural foraminal narrowing.   L1-L2: No significant disc bulge. No spinal canal stenosis or neural foraminal narrowing.   L2-L3: Minimal disc bulge. No spinal canal stenosis or neural foraminal narrowing.   L3-L4: Mild disc bulge. Mild narrowing of the lateral recesses. No spinal canal stenosis or neural foraminal narrowing.   L4-L5: Mild disc bulge with unchanged left extreme lateral disc protrusion, which likely abuts the exiting left L4 nerve. Narrowing of the left lateral recess. No spinal canal stenosis or neural foraminal narrowing.   L5-S1: Small disc bulge. No spinal canal stenosis. Mild bilateral neural foraminal narrowing, unchanged.   IMPRESSION: 1. L5-S1 mild bilateral neural foraminal narrowing. 2. L4-L5 left extreme lateral disc protrusion, which likely abuts the exiting left L4 nerve. Narrowing of the left lateral recess at this level could affect descending left L5 nerve roots. 3. L3-L4 narrowing of the lateral recesses, which  could affect the descending L4 nerve roots. 4. Transitional anatomy, with partial lumbarization of S1. Please correlate with imaging if any intervention is planned.     Electronically Signed   By: Wiliam Ke M.D.   On: 03/14/2022 01:02     Objective:  VS:  HT:    WT:   BMI:     BP:   HR: bpm  TEMP: ( )  RESP:  Physical Exam Vitals and nursing note reviewed.  Constitutional:      General: He is not in acute distress.    Appearance: Normal appearance. He is not ill-appearing.  HENT:     Head: Normocephalic and atraumatic.     Right Ear: External ear normal.     Left Ear: External ear normal.     Nose: No congestion.  Eyes:     Extraocular Movements: Extraocular movements intact.  Cardiovascular:     Rate and Rhythm: Normal rate.     Pulses: Normal pulses.  Pulmonary:     Effort: Pulmonary effort is normal. No respiratory distress.  Abdominal:     General: There is no distension.     Palpations: Abdomen is soft.  Musculoskeletal:        General: No tenderness or signs of injury.     Cervical back: Neck supple.     Right lower leg: No edema.     Left lower leg:  No edema.     Comments: Patient has good distal strength without clonus.  Skin:    Findings: No erythema or rash.  Neurological:     General: No focal deficit present.     Mental Status: He is alert and oriented to person, place, and time.     Sensory: No sensory deficit.     Motor: No weakness or abnormal muscle tone.     Coordination: Coordination normal.  Psychiatric:        Mood and Affect: Mood normal.        Behavior: Behavior normal.      Imaging: No results found.

## 2023-09-25 NOTE — Progress Notes (Signed)
Functional Pain Scale - descriptive words and definitions   Average Pain 5   +Driver, -BT, -Dye Allergies.  Patient is here for back injection. Pain comes and goes. Worse when sitting for long. Pain is only on the left side. Takes Tramadol as needed for pain.

## 2023-09-25 NOTE — Patient Instructions (Signed)

## 2023-09-25 NOTE — Procedures (Signed)
Lumbosacral Transforaminal Epidural Steroid Injection - Sub-Pedicular Approach with Fluoroscopic Guidance  Patient: Robert [name]      Date of Birth: Apr 27, 1946 MRN: [id number] PCP: Ardith Dark, MD      Visit Date: 09/25/2023   Universal Protocol:    Date/Time: 09/25/2023  Consent Given By: the patient  Position: PRONE  Additional Comments: Vital signs were monitored before and after the procedure. Patient was prepped and draped in the usual sterile fashion. The correct patient, procedure, and site was verified.   Injection Procedure Details:   Procedure diagnoses: Lumbar radiculopathy [M54.16]    Meds Administered:  Meds ordered this encounter  Medications   methylPREDNISolone acetate (DEPO-MEDROL) injection 40 mg    Laterality: Left  Location/Site: L4, Transitional S1 segment  Needle:5.0 in., 22 ga.  Short bevel or Quincke spinal needle  Needle Placement: Transforaminal  Findings:    -Comments: Excellent flow of contrast along the nerve, nerve root and into the epidural space.  Procedure Details: After squaring off the end-plates to get a true AP view, the C-arm was positioned so that an oblique view of the foramen as noted above was visualized. The target area is just inferior to the "nose of the scotty dog" or sub pedicular. The soft tissues overlying this structure were infiltrated with 2-3 ml. of 1% Lidocaine without Epinephrine.  The spinal needle was inserted toward the target using a "trajectory" view along the fluoroscope beam.  Under AP and lateral visualization, the needle was advanced so it did not puncture dura and was located close the 6 O'Clock position of the pedical in AP tracterory. Biplanar projections were used to confirm position. Aspiration was confirmed to be negative for CSF and/or blood. A 1-2 ml. volume of Isovue-250 was injected and flow of contrast was noted at each level. Radiographs were obtained for documentation purposes.   After  attaining the desired flow of contrast documented above, a 0.5 to 1.0 ml test dose of 0.25% Marcaine was injected into each respective transforaminal space.  The patient was observed for 90 seconds post injection.  After no sensory deficits were reported, and normal lower extremity motor function was noted,   the above injectate was administered so that equal amounts of the injectate were placed at each foramen (level) into the transforaminal epidural space.   Additional Comments:  No complications occurred Dressing: 2 x 2 sterile gauze and Band-Aid    Post-procedure details: Patient was observed during the procedure. Post-procedure instructions were reviewed.  Patient left the clinic in stable condition.

## 2023-10-07 ENCOUNTER — Other Ambulatory Visit: Payer: Self-pay | Admitting: Family Medicine

## 2023-11-01 ENCOUNTER — Encounter: Payer: Self-pay | Admitting: Pharmacist

## 2023-11-01 NOTE — Progress Notes (Signed)
Pharmacy Quality Measure Review  This patient is appearing on a report for being at risk of failing the adherence measure for diabetes medications this calendar year.   Medication: metformin  Last fill date: 04/29/2023 for 90 day supply  Reviewed patient's chart. Metformin has seen discontinued and he is taking SynJardy (Jardiance + metformin). He has been getting thru the [place] pharmacy because the cost is lower. Reviewed in Care Everywhere and last filled SynJardy for 90 day supply on 08/14/2023.   Also check on rosuvastatin - patient gets this filled by [facility] pharmacy as well but last RF showing was 06/28/2023.   Left voicemail for patient to verify if he is still taking rosuvastatin. Left CB# [phone] to return my call at their convenience.  Henrene Pastor, PharmD Clinical Pharmacist [facility]  Population Health [phone]

## 2023-12-21 ENCOUNTER — Other Ambulatory Visit (HOSPITAL_COMMUNITY): Payer: Self-pay

## 2023-12-21 ENCOUNTER — Telehealth: Payer: Self-pay | Admitting: Pharmacy Technician

## 2023-12-21 NOTE — Telephone Encounter (Signed)
 Pharmacy Patient Advocate Encounter   Received notification from CoverMyMeds that prior authorization for Nurtec 75MG  dispersible tablets is required/requested.   Insurance verification completed.   The patient is insured through [facility] .   Per test claim: The current 30 day co-pay is, $718.96 due to deductible.  No PA needed at this time. This test claim was processed through [facility]- copay amounts may vary at other pharmacies due to pharmacy/plan contracts, or as the patient moves through the different stages of their insurance plan.

## 2023-12-26 ENCOUNTER — Ambulatory Visit: Payer: Medicare Other | Admitting: Family Medicine

## 2023-12-26 ENCOUNTER — Encounter: Payer: Self-pay | Admitting: Family Medicine

## 2023-12-26 VITALS — BP 127/55 | HR 63 | Temp 96.8°F | Ht 73.0 in | Wt 197.8 lb

## 2023-12-26 DIAGNOSIS — Z7984 Long term (current) use of oral hypoglycemic drugs: Secondary | ICD-10-CM

## 2023-12-26 DIAGNOSIS — E785 Hyperlipidemia, unspecified: Secondary | ICD-10-CM

## 2023-12-26 DIAGNOSIS — E1169 Type 2 diabetes mellitus with other specified complication: Secondary | ICD-10-CM

## 2023-12-26 DIAGNOSIS — E1165 Type 2 diabetes mellitus with hyperglycemia: Secondary | ICD-10-CM

## 2023-12-26 LAB — POCT GLYCOSYLATED HEMOGLOBIN (HGB A1C): Hemoglobin A1C: 6.3 % — AB (ref 4.0–5.6)

## 2023-12-26 NOTE — Patient Instructions (Addendum)
It was very nice to see you today!  Your A1c is 6.3.   Please continue your current medications.  Please continue to work on diet and exercise.  Will see you back in 6 months for your physical.  Please come back sooner if needed.  Return in about 6 months (around 06/24/2024) for Annual Physical.   Take care, Dr Jimmey Ralph  PLEASE NOTE:  If you had any lab tests, please let us know if you have not heard back within a few days. You may see your results on mychart before we have a chance to review them but we will give you a call once they are reviewed by [place].   If we ordered any referrals today, please let us know if you have not heard from their office within the next week.   If you had any urgent prescriptions sent in today, please check with the pharmacy within an hour of our visit to make sure the prescription was transmitted appropriately.   Please try these tips to maintain a healthy lifestyle:  Eat at least 3 REAL meals and 1-2 snacks per day.  Aim for no more than 5 hours between eating.  If you eat breakfast, please do so within one hour of getting up.   Each meal should contain half fruits/vegetables, one quarter protein, and one quarter carbs (no bigger than a computer mouse)  Cut down on sweet beverages. This includes juice, soda, and sweet tea.   Drink at least 1 glass of water with each meal and aim for at least 8 glasses per day  Exercise at least 150 minutes every week.

## 2023-12-26 NOTE — Assessment & Plan Note (Signed)
He is doing well on Crestor and Zetia.  Will check lipids when he comes back in for his annual physical.

## 2023-12-26 NOTE — Assessment & Plan Note (Signed)
A1c 6.3.  We will continue metformin 500 mg daily.  Seems as though he cannot tolerate higher doses than this.  Recheck A1c at 6 months.  If A1c is not at goal would consider addition of of GLP-1 agonist or Januvia at that time.

## 2023-12-26 NOTE — Progress Notes (Signed)
   Robert [name] is a 78 y.o. male who presents today for an office visit.  Assessment/Plan:  Chronic Problems Addressed Today: Type 2 diabetes mellitus with hyperglycemia ([name]) A1c 6.3.  We will continue metformin 500 mg daily.  Seems as though he cannot tolerate higher doses than this.  Recheck A1c at 6 months.  If A1c is not at goal would consider addition of of GLP-1 agonist or Januvia at that time.  Hyperlipidemia associated with type 2 diabetes mellitus ([name]) He is doing well on Crestor and Zetia.  Will check lipids when he comes back in for his annual physical.     Subjective:  HPI:  See Assessment / plan for status of chronic conditions. We last saw him about 6 months ago.  At that time his Hemoglobin A1c was 6.6. We continued him on synjardy 12.5-1000mg  daily. Since our last visit he has had significant issues with diarrhea and he ended up stopping the synjardy and going back to metformin 500 mg daily. His diarrhea has significantly improved.        Objective:  Physical Exam: BP (!) 127/55   Pulse 63   Temp (!) 96.8 F (36 C) (Temporal)   Ht 6\' 1"  (1.854 m)   Wt 197 lb 12.8 oz (89.7 kg)   SpO2 90%   BMI 26.10 kg/m   Gen: No acute distress, resting comfortably CV: Regular rate and rhythm with no murmurs appreciated Pulm: Normal work of breathing, clear to auscultation bilaterally with no crackles, wheezes, or rhonchi Neuro: Grossly normal, moves all extremities Psych: Normal affect and thought content      Demtrius Rounds M. Jimmey Ralph, MD 12/26/2023 8:12 AM

## 2024-01-24 ENCOUNTER — Ambulatory Visit
Admission: RE | Admit: 2024-01-24 | Discharge: 2024-01-24 | Disposition: A | Discharge status: Home / Self Care | Payer: Medicare Other | Source: Ambulatory Visit | Attending: Student | Admitting: Student

## 2024-01-24 DIAGNOSIS — R0609 Other forms of dyspnea: Secondary | ICD-10-CM

## 2024-01-24 DIAGNOSIS — R5383 Other fatigue: Secondary | ICD-10-CM

## 2024-01-24 DIAGNOSIS — R531 Weakness: Secondary | ICD-10-CM

## 2024-01-24 MED ORDER — IOPAMIDOL (ISOVUE-300) INJECTION 61%
100.0000 mL | Freq: Once | INTRAVENOUS | Status: AC | PRN
  Administered 2024-01-24: 75 mL via INTRAVENOUS

## 2024-02-09 ENCOUNTER — Ambulatory Visit: Admitting: Family Medicine

## 2024-02-09 ENCOUNTER — Other Ambulatory Visit: Payer: Self-pay | Admitting: Family Medicine

## 2024-02-09 ENCOUNTER — Encounter: Payer: Self-pay | Admitting: Family Medicine

## 2024-02-09 VITALS — BP 121/62 | HR 68 | Temp 97.7°F | Ht 73.0 in | Wt 201.2 lb

## 2024-02-09 DIAGNOSIS — E1159 Type 2 diabetes mellitus with other circulatory complications: Secondary | ICD-10-CM | POA: Diagnosis not present

## 2024-02-09 DIAGNOSIS — R079 Chest pain, unspecified: Secondary | ICD-10-CM | POA: Diagnosis not present

## 2024-02-09 DIAGNOSIS — E1165 Type 2 diabetes mellitus with hyperglycemia: Secondary | ICD-10-CM | POA: Diagnosis not present

## 2024-02-09 DIAGNOSIS — I152 Hypertension secondary to endocrine disorders: Secondary | ICD-10-CM | POA: Diagnosis not present

## 2024-02-09 DIAGNOSIS — Z7984 Long term (current) use of oral hypoglycemic drugs: Secondary | ICD-10-CM

## 2024-02-09 DIAGNOSIS — I2510 Atherosclerotic heart disease of native coronary artery without angina pectoris: Secondary | ICD-10-CM | POA: Diagnosis not present

## 2024-02-09 MED ORDER — NITROGLYCERIN 0.4 MG SL SUBL: 0.4000 mg | SUBLINGUAL_TABLET | SUBLINGUAL | 1 refills | Status: DC | PRN

## 2024-02-09 MED ORDER — AMOXICILLIN 500 MG PO CAPS
ORAL_CAPSULE | ORAL | 0 refills | Status: DC
Start: 2024-02-09 — End: 2024-08-21

## 2024-02-09 NOTE — Assessment & Plan Note (Signed)
 Follows with cardiology for this.  Do not think his recent chest pain was cardiac in etiology however he does need further evaluation.  He will call to schedule appointment soon as above.  He will continue his aspirin and statin.  Will refill his nitroglycerin today.  We discussed reasons to seek emergent care.

## 2024-02-09 NOTE — Patient Instructions (Signed)
 It was very nice to see you today!  I think you probably pulled a muscle in your chest and arm.  He may have also had a migraine.  I am glad that your symptoms have resolved.  Your EKG today is normal.  Please call to schedule appoint with your cardiologist soon.  Call 911 if you get symptoms again.  Return if symptoms worsen or fail to improve.   Take care, Dr Jimmey Ralph  PLEASE NOTE:  If you had any lab tests, please let us know if you have not heard back within a few days. You may see your results on mychart before we have a chance to review them but we will give you a call once they are reviewed by [place].   If we ordered any referrals today, please let us know if you have not heard from their office within the next week.   If you had any urgent prescriptions sent in today, please check with the pharmacy within an hour of our visit to make sure the prescription was transmitted appropriately.   Please try these tips to maintain a healthy lifestyle:  Eat at least 3 REAL meals and 1-2 snacks per day.  Aim for no more than 5 hours between eating.  If you eat breakfast, please do so within one hour of getting up.   Each meal should contain half fruits/vegetables, one quarter protein, and one quarter carbs (no bigger than a computer mouse)  Cut down on sweet beverages. This includes juice, soda, and sweet tea.   Drink at least 1 glass of water with each meal and aim for at least 8 glasses per day  Exercise at least 150 minutes every week.

## 2024-02-09 NOTE — Assessment & Plan Note (Signed)
 Due early to recheck A1c.  Last A1c was 6.3 on metformin 500 mg daily.

## 2024-02-09 NOTE — Assessment & Plan Note (Signed)
 Blood pressure at goal today without meds.

## 2024-02-09 NOTE — Progress Notes (Signed)
   Robert [name] is a 78 y.o. male who presents today for an office visit.  Assessment/Plan:  New/Acute Problems: Atypical Chest Pain  His chest pain resolved yesterday.  He has not had any recurrence since then.  His EKG today is normal.  He had a CTA a couple weeks ago which showed atherosclerosis but was otherwise negative.  His pain was likely musculoskeletal related to intense yard work today prior however he does have cardiac history as well as numerous risk factors.  Given that he is not currently having any pain in his EKG today is normal do not think he needs to go to the emergency room at this point however he does need further cardiac evaluation due to his past history.  Advised him to call his cardiologist to schedule appointment soon.  We will refill his nitroglycerin today.  We discussed reasons to return to care and seek emergent care.  Chronic Problems Addressed Today: Hypertension associated with diabetes ([name]) Blood pressure at goal today without meds.  Type 2 diabetes mellitus with hyperglycemia ([name]) Due early to recheck A1c.  Last A1c was 6.3 on metformin 500 mg daily.  Coronary artery disease Follows with cardiology for this.  Do not think his recent chest pain was cardiac in etiology however he does need further evaluation.  He will call to schedule appointment soon as above.  He will continue his aspirin and statin.  Will refill his nitroglycerin today.  We discussed reasons to seek emergent care.     Subjective:  HPI:  See A/P for status of chronic conditions.  Patient here with episode of chest pain yesterday.  Woke up with the pain. Located in the middle of his left chest. Described as a sharp pain. Pain then started to radiate into his left shoulder. Pain lasted for about half the day but resolved by the end of the day.  He also had a migraine with all of this.  Had some mild nausea with this.  Did not notice any obvious or aggravating factors.  His symptoms resolved  after a few hours of rest.  Did not have any symptoms yesterday evening.  Has not had any recurrence of symptoms today.  No vomiting.  Patient does note that he was doing a lot of yard work the day before his symptoms started he thinks that this may have exacerbated his pain.       Objective:  Physical Exam: BP 121/62   Pulse 68   Temp 97.7 F (36.5 C) (Temporal)   Ht 6\' 1"  (1.854 m)   Wt 201 lb 3.2 oz (91.3 kg)   SpO2 98%   BMI 26.55 kg/m   Gen: No acute distress, resting comfortably CV: Regular rate and rhythm with no murmurs appreciated Pulm: Normal work of breathing, clear to auscultation bilaterally with no crackles, wheezes, or rhonchi Neuro: Grossly normal, moves all extremities Psych: Normal affect and thought content  EKG: Normal sinus rhythm, no ischemic changes  Time Spent: 40 minutes of total time was spent on the date of the encounter performing the following actions: chart review prior to seeing the patient including recent visits with specialists and recent imaging, obtaining history, performing a medically necessary exam, counseling on the treatment plan, placing orders, and documenting in our EHR.  This does not include time spent interpreting above EKG.       Katina Degree. Jimmey Ralph, MD 02/09/2024 8:53 AM

## 2024-03-12 ENCOUNTER — Telehealth: Payer: Self-pay | Admitting: Cardiology

## 2024-03-12 ENCOUNTER — Encounter: Payer: Self-pay | Admitting: Internal Medicine

## 2024-03-12 ENCOUNTER — Ambulatory Visit: Attending: Internal Medicine | Admitting: Internal Medicine

## 2024-03-12 VITALS — BP 128/62 | HR 61 | Ht 73.0 in | Wt 200.4 lb

## 2024-03-12 DIAGNOSIS — R079 Chest pain, unspecified: Secondary | ICD-10-CM | POA: Diagnosis not present

## 2024-03-12 DIAGNOSIS — R0609 Other forms of dyspnea: Secondary | ICD-10-CM

## 2024-03-12 DIAGNOSIS — I10 Essential (primary) hypertension: Secondary | ICD-10-CM

## 2024-03-12 DIAGNOSIS — R0789 Other chest pain: Secondary | ICD-10-CM

## 2024-03-12 NOTE — Progress Notes (Unsigned)
 OFFICE NOTE  Chief Complaint:  Chest pain  Primary Care Physician: Rodney Clamp, MD  HPI:  Robert [name] is a 78 y.o. male with a past medial history significant for CAD  PMHx:  Past Medical History:  Diagnosis Date   Arthritis    CAD (coronary artery disease) 10/10/2011   NSTEMI with DES to the proximal RCA following flow wire evaluation. EF is normal.    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   CORONARY ANGIOPLASTY WITH STENT PLACEMENT  10/13/2011   DES to the proximal RCA. Dr Swaziland   FRACTIONAL FLOW RESERVE WIRE Right 10/13/2011   Procedure: FRACTIONAL FLOW RESERVE WIRE;  Surgeon: Peter M Swaziland, MD;  Location: [facility] CATH LAB;  Service: Cardiovascular;  Laterality: Right;   HERNIA REPAIR     INGUINAL HERNIA REPAIR  10/23/2012   Procedure: HERNIA REPAIR INGUINAL ADULT;  Surgeon: Harlee Lichtenstein, MD;  Location: [facility] OR;  Service: General;  Laterality: Left;  Left lower quadrant; left inguinal hernia repair with mesh   INSERTION OF MESH  10/23/2012   Procedure: INSERTION OF MESH;  Surgeon: Harlee Lichtenstein, MD;  Location: [facility] OR;  Service: General;  Laterality: Left;  Left lower quadrant   LEFT HEART CATHETERIZATION WITH CORONARY ANGIOGRAM N/A 10/13/2011   Procedure: LEFT HEART CATHETERIZATION WITH CORONARY  ANGIOGRAM;  Surgeon: Peter M Swaziland, MD;  Location: [facility] CATH LAB;  Service: Cardiovascular;  Laterality: N/A;   PERCUTANEOUS CORONARY STENT INTERVENTION (PCI-S) Right 10/13/2011   Procedure: PERCUTANEOUS CORONARY STENT INTERVENTION (PCI-S);  Surgeon: Peter M Swaziland, MD;  Location: [facility] CATH LAB;  Service: Cardiovascular;  Laterality: Right;   SHOULDER ARTHROSCOPY W/ ROTATOR CUFF REPAIR Left 1990's   TOTAL HIP ARTHROPLASTY Left 05/14/2021   Procedure: LEFT TOTAL HIP ARTHROPLASTY ANTERIOR APPROACH;  Surgeon: Arnie Lao, MD;  Location: [facility] ORS;  Service: Orthopedics;  Laterality: Left;   TOTAL KNEE ARTHROPLASTY Right 02/20/2023   Procedure: TOTAL KNEE ARTHROPLASTY;  Surgeon: Liliane Rei, MD;  Location: [facility] ORS;  Service: Orthopedics;  Laterality: Right;   TRIGGER FINGER RELEASE Left    3rd and 4th digits    FAMHx:  Family History  Problem Relation Age of Onset   Cancer Mother        lung   Heart disease Father        heart attack    Cancer Father        colon   Migraines Neg Hx    Headache Neg Hx     SOCHx:   reports that he quit smoking about 41 years ago. His smoking use included cigarettes. He started smoking about 61 years ago. He has a 40  pack-year smoking history. He has never used smokeless tobacco. He reports current alcohol  use. He reports that he does not use drugs.  ALLERGIES:  Allergies  Allergen Reactions   Sumatriptan Shortness Of Breath    REACTION: sob, headache scalp sensitivity   Penicillins Itching    Tolerated Cephalosporin Date: 02/21/23.     Lipitor [Atorvastatin ] Nausea And Vomiting   Oxycodone  Itching   Simbrinza [Brinzolamide-Brimonidine ]     Itching of eye    ROS: {Ros - complete:30496}  HOME MEDS: Current Outpatient Medications on File Prior to Visit  Medication Sig Dispense Refill   acetaminophen  (TYLENOL ) 500 MG tablet Take 1,000 mg by mouth every 6 (six) hours as needed for moderate pain.     amoxicillin  (AMOXIL ) 500 MG capsule Take  4 capsules prior to dental procedures 20 capsule 0   Ascorbic Acid  (VITAMIN C) 1000 MG tablet Take 1,000 mg by mouth daily.     azelastine  (ASTELIN ) 0.1 % nasal spray Place 1 spray into both nostrils daily as needed for rhinitis.     B Complex-C (SUPER B COMPLEX PO) Take 1 tablet by mouth daily.     brimonidine  (ALPHAGAN ) 0.2 % ophthalmic solution Place 1 drop into the right eye in the morning and at bedtime.     dicyclomine  (BENTYL ) 10 MG capsule Take 10 mg by mouth 3 (three) times daily as needed for spasms.     Empagliflozin-metFORMIN  HCl ER 12.03-999 MG TB24 Take by mouth.     ezetimibe  (ZETIA ) 10 MG tablet Take 5 mg by mouth daily.     fluticasone  (FLONASE ) 50 MCG/ACT nasal spray Place 1 spray into both nostrils daily as needed for allergies.     latanoprost  (XALATAN ) 0.005 % ophthalmic solution Place 1 drop into the right eye at bedtime.     Multiple Vitamins-Minerals (MULTIVITAMINS THER. W/MINERALS) TABS Take 1 tablet by mouth daily.     nitroGLYCERIN  (NITROSTAT ) 0.4 MG SL tablet PLACE 1 TABLET UNDER THE TONGUE EVERY 5 MINUTES AS NEEDED FOR CHEST PAIN. 25 tablet 1   pantoprazole  (PROTONIX ) 40 MG tablet Take 40 mg by mouth daily.     Rimegepant Sulfate (NURTEC) 75 MG TBDP Take 1 tablet (75 mg total) by mouth daily as needed. For migraines. Take as close to onset of migraine as possible. One daily maximum. 10 tablet 11   rosuvastatin  (CRESTOR ) 40 MG tablet Take 40 mg by mouth daily.     tamsulosin  (FLOMAX ) 0.4 MG CAPS capsule TAKE 1 CAPSULE BY MOUTH EVERY DAY 30 capsule 1   traMADol  (ULTRAM ) 50 MG tablet Take 100 mg by mouth every 6 (six) hours as needed (migraines).     vitamin E  180 MG (400 UNITS) capsule Take 400 Units by mouth daily.     No current facility-administered medications on file prior to visit.    LABS/IMAGING: No results found for this or any previous visit (from the past 48 hours). No results found.  LIPID PANEL:    Component Value Date/Time   CHOL 133 05/25/2023  0000   CHOL 135 06/09/2020 1054   TRIG 114 05/25/2023 0000   TRIG 72 10/18/2006 1119   HDL 59 05/25/2023 0000   HDL 55 06/09/2020 1054   CHOLHDL 3 06/15/2022 0916   VLDL 24.0 06/15/2022 0916   LDLCALC 58 05/25/2023 0000   LDLCALC 59 06/09/2020 1054   LDLDIRECT 137.0 03/31/2010 0808     WEIGHTS: Wt Readings from Last 3 Encounters:  03/12/24 200 lb 6.4 oz (90.9 kg)  02/09/24 201 lb 3.2 oz (91.3 kg)  12/26/23 197 lb 12.8 oz (89.7 kg)    VITALS: BP 128/62 (BP Location: Left Arm, Patient Position: Sitting, Cuff Size: Normal)   Pulse 61   Ht 6\' 1"  (1.854 m)   Wt 200 lb 6.4 oz (90.9 kg)   SpO2 97%   BMI 26.44 kg/m   EXAM: {Physical ZOXW:9604540}  *** Examination was chaperoned by Felipe Horton, RN.   EKG: *** - personally reviewed  ASSESSMENT: ***  PLAN: 1.   ***  Hazle Lites, MD, [facility], FNLA, FACP  [place]  [facility]  Medical Director of the Advanced Lipid Disorders &  Cardiovascular Risk Reduction Clinic Diplomate of the American Board of Clinical Lipidology Attending Cardiologist  Direct Dial: [phone]  Fax: [phone]  Website:  [email or web address] 03/12/2024, 3:54 PM

## 2024-03-12 NOTE — Patient Instructions (Signed)
 Medication Instructions:  NO CHANGES  *If you need a refill on your cardiac medications before your next appointment, please call your pharmacy*  Testing/Procedures: Dr. Maximo Spar has ordered a Lexiscan Myocardial Perfusion Imaging Study. Schedule ASAP  Please arrive 15 minutes prior to your appointment time for registration and insurance purposes.   The test will take approximately 3 to 4 hours to complete; you may bring reading material.  If someone comes with you to your appointment, they will need to remain in the main lobby due to limited space in the testing area. **If you are pregnant or breastfeeding, please notify the nuclear lab prior to your appointment**   How to prepare for your Myocardial Perfusion Test: Do not eat or drink 3 hours prior to your test, except you may have water . Do not consume products containing caffeine (regular or decaffeinated) 12 hours prior to your test. (ex: coffee, chocolate, sodas, tea). Do wear comfortable clothes (no dresses or overalls) and walking shoes, tennis shoes preferred (No heels or open toe shoes are allowed). Do NOT wear cologne, perfume, aftershave, or lotions (deodorant is allowed). If you use an inhaler, use it the AM of your test and bring it with you.  If you use a nebulizer, use it the AM of your test.  If these instructions are not followed, your test will have to be rescheduled.   Follow-Up: At [facility], you and your health needs are our priority.  As part of our continuing mission to provide you with exceptional heart care, our providers are all part of one team.  This team includes your primary Cardiologist (physician) and Advanced Practice Providers or APPs (Physician Assistants and Nurse Practitioners) who all work together to provide you with the care you need, when you need it.  Your next appointment:    4-6 weeks with Dr. Swaziland or PA/NP for testing follow up  We recommend signing up for the patient portal  called "MyChart".  Sign up information is provided on this After Visit Summary.  MyChart is used to connect with patients for Virtual Visits (Telemedicine).  Patients are able to view lab/test results, encounter notes, upcoming appointments, etc.  Non-urgent messages can be sent to your provider as well.   To learn more about what you can do with MyChart, go to [email or web address].

## 2024-03-12 NOTE — Telephone Encounter (Signed)
   Pt c/o of Chest Pain: STAT if active CP, including tightness, pressure, jaw pain, radiating pain to shoulder/upper arm/back, CP unrelieved by Nitro. Symptoms reported of SOB, nausea, vomiting, sweating.  1. Are you having CP right now? Chest pain yesterday, not at this time    2. Are you experiencing any other symptoms (ex. SOB, nausea, vomiting, sweating)? Some shortness of breath, not at this time   3. Is your CP continuous or coming and going? Comes and goes   4. Have you taken Nitroglycerin ? yes   5. How long have you been experiencing CP? Only yesterday- patient wants to be seen asap    6. If NO CP at time of call then end call with telling Pt to call back or call 911 if Chest pain returns prior to return call from triage team.

## 2024-03-12 NOTE — Telephone Encounter (Signed)
 Patient identification verified by 2 forms. Hilton Lucky, RN     Called and spoke to patient Patient States:   -Yesterday he developed chest pain after completing yard work   -he had chest tightness and felt like he could not breathe/take in air   -he had to take NTG and aspirin , pain improved but he still did not feel right   -pain was different from his heart attack pain   -Today he does not have chest pain   -he continues to feel weak and overall disoriented   -He would like an OV with Dr. Swaziland  Patient scheduled for OV today with Dr. Maximo Spar at 3:20 Pm  Informed patient if chest pain returns, present to ED  Patient verbalized understanding, no questions at this time

## 2024-03-15 ENCOUNTER — Other Ambulatory Visit: Payer: Self-pay | Admitting: Internal Medicine

## 2024-03-15 ENCOUNTER — Encounter (HOSPITAL_COMMUNITY): Payer: Self-pay

## 2024-03-15 DIAGNOSIS — R0789 Other chest pain: Secondary | ICD-10-CM

## 2024-03-20 ENCOUNTER — Ambulatory Visit (HOSPITAL_COMMUNITY): Attending: Cardiology

## 2024-03-20 DIAGNOSIS — R0789 Other chest pain: Secondary | ICD-10-CM | POA: Diagnosis present

## 2024-03-20 LAB — MYOCARDIAL PERFUSION IMAGING
LV dias vol: 119 mL (ref 62–150)
LV sys vol: 43 mL
Nuc Stress EF: 64 %
Peak HR: 78 {beats}/min
Rest HR: 48 {beats}/min
Rest Nuclear Isotope Dose: 10.9 mCi
SDS: 2
SRS: 0
SSS: 2
ST Depression (mm): 0 mm
Stress Nuclear Isotope Dose: 29.9 mCi
TID: 1.18

## 2024-03-20 MED ORDER — REGADENOSON 0.4 MG/5ML IV SOLN
INTRAVENOUS | Status: AC
  Filled 2024-03-20: qty 5

## 2024-03-20 MED ORDER — REGADENOSON 0.4 MG/5ML IV SOLN
0.4000 mg | Freq: Once | INTRAVENOUS | Status: AC
  Administered 2024-03-20: 0.4 mg via INTRAVENOUS

## 2024-03-20 MED ORDER — TECHNETIUM TC 99M TETROFOSMIN IV KIT
10.9000 | PACK | Freq: Once | INTRAVENOUS | Status: AC | PRN
  Administered 2024-03-20: 10.9 via INTRAVENOUS

## 2024-03-20 MED ORDER — TECHNETIUM TC 99M TETROFOSMIN IV KIT
29.9000 | PACK | Freq: Once | INTRAVENOUS | Status: AC | PRN
  Administered 2024-03-20: 29.9 via INTRAVENOUS

## 2024-03-25 ENCOUNTER — Telehealth: Payer: Self-pay | Admitting: Neurology

## 2024-03-25 NOTE — Telephone Encounter (Signed)
 Pt called to cancel appt.  Appt Canceled

## 2024-03-26 ENCOUNTER — Ambulatory Visit (HOSPITAL_BASED_OUTPATIENT_CLINIC_OR_DEPARTMENT_OTHER): Payer: Self-pay | Admitting: Internal Medicine

## 2024-03-27 ENCOUNTER — Ambulatory Visit: Payer: Medicare Other | Admitting: Neurology

## 2024-04-01 ENCOUNTER — Ambulatory Visit: Payer: Self-pay | Admitting: Internal Medicine

## 2024-04-09 NOTE — Progress Notes (Unsigned)
 Cardiology Clinic Note   Patient Name: Robert [name] Date of Encounter: 04/10/2024  Primary Care Provider:  Rodney Clamp, MD Primary Cardiologist:  Peter Swaziland, MD  Patient Profile    Robert [name] 78 year old male presents to the clinic today for follow-up of his chest pain.  Past Medical History    Past Medical History:  Diagnosis Date   Arthritis    CAD (coronary artery disease) 10/10/2011   NSTEMI with DES to the proximal RCA following flow wire evaluation. EF is normal.    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   CORONARY ANGIOPLASTY WITH STENT PLACEMENT  10/13/2011   DES to the proximal RCA. Dr Swaziland   FRACTIONAL FLOW RESERVE WIRE Right 10/13/2011   Procedure: FRACTIONAL FLOW RESERVE WIRE;  Surgeon: Peter M Swaziland, MD;  Location: [facility] CATH LAB;  Service: Cardiovascular;  Laterality: Right;   HERNIA REPAIR     INGUINAL HERNIA REPAIR  10/23/2012   Procedure: HERNIA REPAIR INGUINAL ADULT;  Surgeon: Harlee Lichtenstein, MD;  Location: [facility] OR;  Service: General;  Laterality: Left;  Left lower quadrant; left inguinal hernia repair with mesh   INSERTION OF MESH  10/23/2012   Procedure: INSERTION OF MESH;  Surgeon: Harlee Lichtenstein, MD;  Location: [facility] OR;  Service: General;  Laterality: Left;  Left lower quadrant    LEFT HEART CATHETERIZATION WITH CORONARY ANGIOGRAM N/A 10/13/2011   Procedure: LEFT HEART CATHETERIZATION WITH CORONARY ANGIOGRAM;  Surgeon: Peter M Swaziland, MD;  Location: [facility] CATH LAB;  Service: Cardiovascular;  Laterality: N/A;   PERCUTANEOUS CORONARY STENT INTERVENTION (PCI-S) Right 10/13/2011   Procedure: PERCUTANEOUS CORONARY STENT INTERVENTION (PCI-S);  Surgeon: Peter M Swaziland, MD;  Location: [facility] CATH LAB;  Service: Cardiovascular;  Laterality: Right;   SHOULDER ARTHROSCOPY W/ ROTATOR CUFF REPAIR Left 1990's   TOTAL HIP ARTHROPLASTY Left 05/14/2021   Procedure: LEFT TOTAL HIP ARTHROPLASTY ANTERIOR APPROACH;  Surgeon: Arnie Lao, MD;  Location: [facility] ORS;  Service: Orthopedics;  Laterality: Left;   TOTAL KNEE ARTHROPLASTY Right 02/20/2023   Procedure: TOTAL KNEE ARTHROPLASTY;  Surgeon: Liliane Rei, MD;  Location: [facility] ORS;  Service: Orthopedics;  Laterality: Right;   TRIGGER FINGER RELEASE Left    3rd and 4th digits    Allergies  Allergies  Allergen Reactions   Sumatriptan Shortness Of Breath    REACTION: sob, headache scalp sensitivity   Penicillins Itching    Tolerated Cephalosporin Date: 02/21/23.     Lipitor [Atorvastatin ] Nausea And Vomiting   Oxycodone  Itching   Simbrinza [Brinzolamide-Brimonidine ]     Itching of eye    History of Present Illness    Robert [name] has a PMH of  HTN, DOE, chest pressure, and chest pain of uncertain etiology.  He was seen and evaluated by Dr. Maximo Spar on 03/12/2024 for chest pain.  He had been added to his schedule for evaluation.  He reported having an episode of intense chest pressure when he was doing some work in his yard.  He reported that the pain took his breath away.  He noted that he did not feel as if he could breathe for several minutes.  He did not pass out.  His symptoms subsided.  He noted that he had never had intense pain like that before.  He did note that he had a episode of shortness of breath and pressure in his back 3/25.   He presented to the emergency department to be evaluated.  He had CT angiogram which was negative for PE.  He presented to his PCP afterward he was referred to cardiology.  He did undergo stress testing in 2025 which was negative for ischemia.  A follow-up nuclear stress test was ordered 03/20/2024.  That showed an EF of 64%, no evidence of ischemia and low risk.  He presents to the clinic today for follow-up evaluation and states he has not had any further episodes of chest discomfort.  He continues to be very physically active.  He walks daily and enjoys feeding his deer and turkeys.  We reviewed his previous clinic visit.  He expressed understanding.  We reviewed his previous echocardiogram and recent stress test.  I provided reassurance.  His blood pressure was noted to be 138/64 today.  I will give him the salty 6 diet sheet, have him maintain his physical activity and plan follow-up in 12 months.  Today he denies chest pain, shortness of breath, lower extremity edema, fatigue, palpitations, melena, hematuria, hemoptysis, diaphoresis, weakness, presyncope, syncope, orthopnea, and PND.     Home Medications    Prior to Admission medications   Medication Sig Start Date End Date Taking? Authorizing Provider  acetaminophen  (TYLENOL ) 500 MG tablet Take 1,000 mg by mouth every 6 (six) hours as needed for moderate pain.    [provider]  amoxicillin  (AMOXIL ) 500 MG capsule Take 4 capsules prior to dental procedures 02/09/24   Rodney Clamp, MD  Ascorbic Acid  (VITAMIN C) 1000 MG tablet Take 1,000 mg by mouth daily.    [provider]  azelastine  (ASTELIN ) 0.1 % nasal spray Place 1 spray into both nostrils daily as needed for rhinitis.    [provider]  B Complex-C (SUPER B COMPLEX PO) Take 1 tablet by mouth daily.    [provider]  brimonidine  (ALPHAGAN ) 0.2 % ophthalmic solution Place 1 drop into the right eye in the morning and at bedtime.    [provider]  dicyclomine  (BENTYL ) 10 MG capsule Take 10 mg by mouth 3 (three) times daily as needed for spasms.    [provider]  Empagliflozin-metFORMIN  HCl ER 12.03-999 MG TB24 Take by mouth. 05/25/23   [provider]  ezetimibe  (ZETIA ) 10 MG tablet Take 5 mg by mouth daily. 01/11/22   [provider]  fluticasone  (FLONASE ) 50 MCG/ACT nasal spray Place 1 spray into both nostrils daily as needed for allergies.    [provider]  latanoprost  (XALATAN ) 0.005 % ophthalmic solution Place 1 drop into the right eye at bedtime.    [provider]  Multiple Vitamins-Minerals (MULTIVITAMINS THER. W/MINERALS) TABS Take 1 tablet by mouth daily.    [provider]  nitroGLYCERIN  (NITROSTAT ) 0.4  MG SL tablet PLACE 1 TABLET UNDER THE TONGUE EVERY 5 MINUTES AS NEEDED FOR CHEST PAIN. 02/09/24   Rodney Clamp, MD  pantoprazole  (PROTONIX ) 40 MG tablet Take 40 mg by mouth daily. 01/28/21   [provider]  Rimegepant Sulfate (NURTEC) 75 MG TBDP Take 1 tablet (75 mg total) by mouth daily as needed. For migraines. Take as close to onset of migraine as possible. One daily maximum. 03/28/23   Wess Hammed, NP  rosuvastatin  (CRESTOR ) 40 MG tablet Take 40 mg by mouth daily. 01/28/21   [provider]  tamsulosin  (FLOMAX ) 0.4 MG CAPS capsule TAKE 1 CAPSULE BY MOUTH EVERY DAY 03/07/22   Alexander Iba, PA  traMADol  (ULTRAM ) 50 MG tablet Take 100 mg by mouth every 6 (six) hours as needed (migraines). 01/11/22   [provider]  vitamin E  180 MG (400 UNITS) capsule Take 400 Units by mouth daily.    [provider]    Family History    Family History  Problem Relation Age of Onset   Cancer Mother        lung   Heart disease Father        heart attack    Cancer Father        colon   Migraines Neg Hx    Headache Neg Hx    He indicated that his mother is deceased. He indicated that his father is deceased. He indicated that  his maternal grandmother is deceased. He indicated that his maternal grandfather is deceased. He indicated that his paternal grandmother is deceased. He indicated that his paternal grandfather is deceased. He indicated that the status of his neg hx is unknown.  Social History    Social History   Socioeconomic History   Marital status: Married    Spouse name: Not on file   Number of children: Not on file   Years of education: Not on file   Highest education level: 12th grade  Occupational History    Comment: Retired -[employer]  Tobacco Use   Smoking status: Former    Current packs/day: 0.00    Average packs/day: 2.0 packs/day for 20.0 years (40.0 ttl pk-yrs)    Types: Cigarettes    Start date: 11/14/1962    Quit date: 11/14/1982    Years since quitting: 41.4   Smokeless tobacco: Never  Vaping Use   Vaping status: Never Used  Substance and Sexual Activity   Alcohol  use: Yes    Comment: occ   Drug use: No   Sexual activity: Not on file  Other Topics Concern   Not on file  Social History Narrative   Pt lives with his wife. 1 and a half story home  No issues with stairs. 12th grade education   Social Drivers of Health   Financial Resource Strain: Low Risk  (09/21/2023)   Overall Financial Resource Strain (CARDIA)    Difficulty of Paying Living Expenses: Not hard at all  Food Insecurity: No Food Insecurity (09/21/2023)   Hunger Vital Sign    Worried About Running Out of Food in the Last Year: Never true    Ran Out of Food in the Last Year: Never true  Transportation Needs: No Transportation Needs (09/21/2023)   PRAPARE - Administrator, Civil Service (Medical): No    Lack of Transportation (Non-Medical): No  Physical Activity: Inactive (09/21/2023)   Exercise Vital Sign    Days of Exercise per Week: 0 days    Minutes of  Exercise per Session: 0 min  Stress: No Stress Concern Present (09/21/2023)   [employer] of Occupational Health - Occupational Stress Questionnaire     Feeling of Stress : Not at all  Social Connections: Moderately Integrated (09/21/2023)   Social Connection and Isolation Panel [NHANES]    Frequency of Communication with Friends and Family: More than three times a week    Frequency of Social Gatherings with Friends and Family: More than three times a week    Attends Religious Services: Never    Database administrator or Organizations: Yes    Attends Banker Meetings: 1 to 4 times per year    Marital Status: Married  Catering manager Violence: Not At Risk (09/21/2023)   Humiliation, Afraid, Rape, and Kick questionnaire    Fear of Current or Ex-Partner: No    Emotionally Abused: No    Physically Abused: No    Sexually Abused: No     Review of Systems    General:  No chills, fever, night sweats or weight changes.  Cardiovascular:  No chest pain, dyspnea on exertion, edema, orthopnea, palpitations, paroxysmal nocturnal dyspnea. Dermatological: No rash, lesions/masses Respiratory: No cough, dyspnea Urologic: No hematuria, dysuria Abdominal:   No nausea, vomiting, diarrhea, bright red blood per rectum, melena, or hematemesis Neurologic:  No visual changes, wkns, changes in mental status. All other systems reviewed and are otherwise negative except as noted above.  Physical Exam    VS:  BP 138/64 (BP Location: Right Arm, Patient Position: Sitting, Cuff Size: Normal)   Pulse 64   Ht 6\' 1"  (1.854 m)   Wt 203 lb 3.2 oz (92.2 kg)   SpO2 95%   BMI 26.81 kg/m  , BMI Body mass index is 26.81 kg/m. GEN: Well nourished, well developed, in no acute distress. HEENT: normal. Neck: Supple, no JVD, carotid bruits, or masses. Cardiac: RRR, no murmurs, rubs, or gallops. No clubbing, cyanosis, edema.  Radials/DP/PT 2+ and equal bilaterally.  Respiratory:  Respirations regular and unlabored, clear to auscultation bilaterally. GI: Soft, nontender, nondistended, BS + x 4. MS: no deformity or atrophy. Skin: warm and dry, no  rash. Neuro:  Strength and sensation are intact. Psych: Normal affect.  Accessory Clinical Findings    Recent Labs: 05/25/2023: ALT 18; BUN 5; Creatinine 0.8; Hemoglobin 13.9; Platelets 186; Potassium 5.2; Sodium 138; TSH 2.23   Recent Lipid Panel    Component Value Date/Time   CHOL 133 05/25/2023 0000   CHOL 135 06/09/2020 1054   TRIG 114 05/25/2023 0000   TRIG 72 10/18/2006 1119   HDL 59 05/25/2023 0000   HDL 55 06/09/2020 1054   CHOLHDL 3 06/15/2022 0916   VLDL 24.0 06/15/2022 0916   LDLCALC 58 05/25/2023 0000   LDLCALC 59 06/09/2020 1054   LDLDIRECT 137.0 03/31/2010 0808         ECG personally reviewed by me today-none today.     Nuclear stress test 03/20/2024    The study is normal. The study is low risk.   No ST deviation was noted.   LV perfusion is normal. There is no evidence of ischemia. There is no evidence of infarction.   Left ventricular function is normal. Nuclear stress EF: 64%. The left ventricular ejection fraction is normal (55-65%). End diastolic cavity size is normal. End systolic cavity size is normal. No evidence of transient ischemic dilation (TID) noted.   CT images were obtained for attenuation correction and were examined for the presence of coronary calcium   when appropriate.   Coronary calcium  assessment not performed due to prior revascularization.   Prior study available for comparison from 01/15/2014. No changes compared to prior study.     Assessment & Plan   1.  Chest pain-denies further episodes of chest discomfort.  Denies recent anginal type symptoms.  Reassuring stress testing on 03/20/2024.  Details above. Heart healthy low-sodium diet Increase physical activity as tolerated  Dyspnea-reports that his breathing has returned to baseline. Increase physical activity as tolerated Follows with PCP  Hyperlipidemia-LDL 58 on 05/25/23. High-fiber diet Continue ezetimibe , rosuvastatin   Coronary artery disease-denies recent chest pain.  He  is status post STEMI with DES to his proximal RCA 11/12.  He had normal stress testing 3/15. Heart healthy low-sodium high-fiber diet Continue ezetimibe , rosuvastatin , nitroglycerin  as needed  GERD-reports compliance with Protonix . GERD diet instructions given Follows with PCP  Disposition: Follow-up with Dr. Swaziland or me in 12 months.   Chet Cota. Abdiel Blackerby NP-C     04/10/2024, 8:54 AM [facility] [address] Office [phone] Fax [phone]    I spent 14 minutes examining this patient, reviewing medications, and using patient centered shared decision making involving their cardiac care.   I spent  20 minutes reviewing past medical history,  medications, and prior cardiac tests.

## 2024-04-10 ENCOUNTER — Encounter: Payer: Self-pay | Admitting: General Practice

## 2024-04-10 ENCOUNTER — Ambulatory Visit: Attending: General Practice | Admitting: General Practice

## 2024-04-10 VITALS — BP 138/64 | HR 64 | Ht 73.0 in | Wt 203.2 lb

## 2024-04-10 DIAGNOSIS — E785 Hyperlipidemia, unspecified: Secondary | ICD-10-CM

## 2024-04-10 DIAGNOSIS — R079 Chest pain, unspecified: Secondary | ICD-10-CM

## 2024-04-10 DIAGNOSIS — R0609 Other forms of dyspnea: Secondary | ICD-10-CM

## 2024-04-10 DIAGNOSIS — K219 Gastro-esophageal reflux disease without esophagitis: Secondary | ICD-10-CM

## 2024-04-10 NOTE — Patient Instructions (Signed)
 Medication Instructions:  Your physician recommends that you continue on your current medications as directed. Please refer to the Current Medication list given to you today.  *If you need a refill on your cardiac medications before your next appointment, please call your pharmacy*  Lab Work: NONE ordered at this time of appointment   Testing/Procedures: NONE ordered at this time of appointment    Follow-Up: At [facility], you and your health needs are our priority.  As part of our continuing mission to provide you with exceptional heart care, our providers are all part of one team.  This team includes your primary Cardiologist (physician) and Advanced Practice Providers or APPs (Physician Assistants and Nurse Practitioners) who all work together to provide you with the care you need, when you need it.  Your next appointment:   12 month(s)  Provider:   Peter Swaziland, MD or Lawana Pray, NP          We recommend signing up for the patient portal called "MyChart".  Sign up information is provided on this After Visit Summary.  MyChart is used to connect with patients for Virtual Visits (Telemedicine).  Patients are able to view lab/test results, encounter notes, upcoming appointments, etc.  Non-urgent messages can be sent to your provider as well.   To learn more about what you can do with MyChart, go to [email or web address].   Other Instructions  Increase Physical Activity as tolerated

## 2024-05-30 ENCOUNTER — Encounter: Payer: Self-pay | Admitting: Physician Assistant

## 2024-05-30 ENCOUNTER — Ambulatory Visit: Admitting: Physician Assistant

## 2024-05-30 VITALS — BP 110/60 | HR 70 | Temp 98.3°F | Ht 73.0 in | Wt 203.0 lb

## 2024-05-30 DIAGNOSIS — G8929 Other chronic pain: Secondary | ICD-10-CM

## 2024-05-30 DIAGNOSIS — R5383 Other fatigue: Secondary | ICD-10-CM | POA: Diagnosis not present

## 2024-05-30 DIAGNOSIS — J029 Acute pharyngitis, unspecified: Secondary | ICD-10-CM

## 2024-05-30 DIAGNOSIS — Z7984 Long term (current) use of oral hypoglycemic drugs: Secondary | ICD-10-CM

## 2024-05-30 DIAGNOSIS — E1165 Type 2 diabetes mellitus with hyperglycemia: Secondary | ICD-10-CM

## 2024-05-30 DIAGNOSIS — M25511 Pain in right shoulder: Secondary | ICD-10-CM

## 2024-05-30 LAB — COMPREHENSIVE METABOLIC PANEL WITH GFR
ALT: 21 U/L (ref 0–53)
AST: 25 U/L (ref 0–37)
Albumin: 4.5 g/dL (ref 3.5–5.2)
Alkaline Phosphatase: 44 U/L (ref 39–117)
BUN: 14 mg/dL (ref 6–23)
CO2: 31 meq/L (ref 19–32)
Calcium: 9.4 mg/dL (ref 8.4–10.5)
Chloride: 100 meq/L (ref 96–112)
Creatinine, Ser: 0.79 mg/dL (ref 0.40–1.50)
GFR: 83.92 mL/min (ref >60.00)
Glucose, Bld: 100 mg/dL — ABNORMAL HIGH (ref 70–99)
Potassium: 4.6 meq/L (ref 3.5–5.1)
Sodium: 137 meq/L (ref 135–145)
Total Bilirubin: 0.5 mg/dL (ref 0.2–1.2)
Total Protein: 6.7 g/dL (ref 6.0–8.3)

## 2024-05-30 LAB — CBC WITH DIFFERENTIAL/PLATELET
Basophils Absolute: 0 K/uL (ref 0.0–0.1)
Basophils Relative: 0.4 % (ref 0.0–3.0)
Eosinophils Absolute: 0.1 K/uL (ref 0.0–0.7)
Eosinophils Relative: 0.7 % (ref 0.0–5.0)
HCT: 38.6 % — ABNORMAL LOW (ref 39.0–52.0)
Hemoglobin: 12.9 g/dL — ABNORMAL LOW (ref 13.0–17.0)
Lymphocytes Relative: 9.8 % — ABNORMAL LOW (ref 12.0–46.0)
Lymphs Abs: 0.7 K/uL (ref 0.7–4.0)
MCHC: 33.4 g/dL (ref 30.0–36.0)
MCV: 93.7 fl (ref 78.0–100.0)
Monocytes Absolute: 0.7 K/uL (ref 0.1–1.0)
Monocytes Relative: 10.1 % (ref 3.0–12.0)
Neutro Abs: 5.4 K/uL (ref 1.4–7.7)
Neutrophils Relative %: 79 % — ABNORMAL HIGH (ref 43.0–77.0)
Platelets: 142 K/uL — ABNORMAL LOW (ref 150.0–400.0)
RBC: 4.13 Mil/uL — ABNORMAL LOW (ref 4.22–5.81)
RDW: 14.2 % (ref 11.5–15.5)
WBC: 6.8 K/uL (ref 4.0–10.5)

## 2024-05-30 LAB — SEDIMENTATION RATE: Sed Rate: 11 mm/h (ref 0–20)

## 2024-05-30 LAB — C-REACTIVE PROTEIN: CRP: <1 mg/dL (ref 0.5–20.0)

## 2024-05-30 LAB — HEMOGLOBIN A1C: Hgb A1c MFr Bld: 7.1 % — ABNORMAL HIGH (ref 4.6–6.5)

## 2024-05-30 LAB — POCT RAPID STREP A (OFFICE): Rapid Strep A Screen: NEGATIVE

## 2024-05-30 MED ORDER — KETOROLAC TROMETHAMINE 60 MG/2ML IM SOLN
60.0000 mg | Freq: Once | INTRAMUSCULAR | Status: AC
  Administered 2024-05-30: 30 mg via INTRAMUSCULAR

## 2024-05-30 NOTE — Patient Instructions (Signed)
 It was great to see you!  For your arthritis: We will do a low dose Toradol  shot I will refer you to sports medicine for your shoulder  For your bug: We will check for strep, do a urinalysis and add blood work Please restart your tamsulosin   We will be in touch with all results  If new/worsening symptom(s), let us  know   Take care,  Despina Boan PA-C

## 2024-05-30 NOTE — Progress Notes (Signed)
 Robert [name] is a 78 y.o. male here for a new problem.  History of Present Illness:   Chief Complaint  Patient presents with   Fatigue    Pt c/o no energy, no appetite since yesterday.    Joint Pain    Pt c/o right hip pain, both shoulders right > left, left knee.    HPI  Fatigue Pt complains of no energy and no appetite starting yesterday.  He also complains of a mild sore throat and a headache which are different from his migraines. He states he feels the headache towards the front of his head and that his eyes burn. Pt describes his overall feeling as just not feeling well. He reports wife and daughter have experienced similar symptoms. His daughter experienced them about a month ago for 3-4 days. Pt notes he stopped Flomax  a couple of weeks ago -- just didn't want to be on it anymore. Pt has not yet eaten today. Denies fever or chills, diarrhea, abdominal pain, ear pain, recent tick bites, or rash/skin issues.  Arthritis Pt complains of pain in his right hip, both shoulders, and left knee starting  His right shoulder pain started 6 months ago and has been gradually getting worse. His left shoulder feels better than his right, but he can feel it coming on. He reports LOR and a clicking noise when moving his right arm. His left knee and neck feel achey as well. Endorses taking Tramadol , which was originally rx for migraines, and applying lidocaine  5% patch each night for sleep. Pt has not taken Ibuprofen  due to hx of ulcers in esophagus and bottom of stomach. 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 EF is  normal.    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    left   Squamous cell carcinoma 12/17/2016   Stroke [facility])    found on MRI - pt unaware had stroke approx 7 years ago     Social History   Tobacco Use   Smoking status: Former    Current packs/day: 0.00    Average packs/day: 2.0 packs/day for 20.0 years (40.0 ttl pk-yrs)    Types: Cigarettes    Start date: 11/14/1962    Quit date: 11/14/1982    Years since quitting: 41.5   Smokeless tobacco: Never  Vaping Use   Vaping status: Never Used  Substance Use Topics   Alcohol  use: Yes    Comment: occ   Drug use: No    Past Surgical History:  Procedure Laterality Date   BACK SURGERY     CATARACT EXTRACTION Bilateral 2019   CATARACT EXTRACTION W/ INTRAOCULAR LENS IMPLANT Bilateral 01/2019   CERVICAL DISC  SURGERY  1980's?   CORONARY ANGIOPLASTY WITH STENT PLACEMENT  10/13/2011   DES to the proximal RCA. Dr Swaziland   FRACTIONAL FLOW RESERVE WIRE Right 10/13/2011   Procedure: FRACTIONAL FLOW RESERVE WIRE;  Surgeon: Peter M Swaziland, MD;  Location: [facility] CATH LAB;  Service: Cardiovascular;  Laterality: Right;   HERNIA REPAIR     INGUINAL HERNIA REPAIR  10/23/2012   Procedure: HERNIA REPAIR INGUINAL ADULT;  Surgeon: Krystal JINNY Russell, MD;  Location: [facility] OR;  Service: General;  Laterality: Left;  Left lower quadrant; left inguinal hernia repair with mesh   INSERTION OF MESH  10/23/2012   Procedure: INSERTION OF MESH;  Surgeon: Krystal JINNY Russell, MD;  Location: [facility] OR;  Service: General;  Laterality: Left;  Left lower quadrant   LEFT HEART CATHETERIZATION WITH CORONARY ANGIOGRAM N/A 10/13/2011   Procedure: LEFT  HEART CATHETERIZATION WITH CORONARY ANGIOGRAM;  Surgeon: Peter M Swaziland, MD;  Location: [facility] CATH LAB;  Service: Cardiovascular;  Laterality: N/A;   PERCUTANEOUS CORONARY STENT INTERVENTION (PCI-S) Right 10/13/2011   Procedure: PERCUTANEOUS CORONARY STENT INTERVENTION (PCI-S);  Surgeon: Peter M Swaziland, MD;  Location: [facility] CATH LAB;  Service: Cardiovascular;  Laterality: Right;   SHOULDER ARTHROSCOPY W/ ROTATOR CUFF REPAIR Left 1990's   TOTAL HIP ARTHROPLASTY Left 05/14/2021   Procedure: LEFT TOTAL HIP ARTHROPLASTY ANTERIOR APPROACH;  Surgeon: Vernetta Lonni GRADE, MD;  Location: [facility] ORS;  Service: Orthopedics;  Laterality: Left;   TOTAL KNEE ARTHROPLASTY Right 02/20/2023   Procedure: TOTAL KNEE ARTHROPLASTY;  Surgeon: Melodi Lerner, MD;  Location: [facility] ORS;  Service: Orthopedics;  Laterality: Right;   TRIGGER FINGER RELEASE Left    3rd and 4th digits    Family History  Problem Relation Age of Onset   Cancer Mother        lung   Heart disease Father        heart attack    Cancer Father        colon   Migraines Neg Hx    Headache Neg Hx     Allergies  Allergen Reactions   Sumatriptan Shortness Of Breath    REACTION: sob, headache scalp sensitivity   Penicillins Itching    Tolerated Cephalosporin Date: 02/21/23.     Empagliflozin Diarrhea   Lipitor [Atorvastatin ] Nausea And Vomiting   Oxycodone  Itching   Simbrinza [Brinzolamide-Brimonidine ]     Itching of eye    Current Medications:   Current Outpatient Medications:    acetaminophen  (TYLENOL ) 500 MG tablet, Take 1,000 mg by mouth every 6 (six) hours as needed for moderate pain., Disp: , Rfl:    amoxicillin  (AMOXIL ) 500 MG capsule, Take 4 capsules prior to dental procedures, Disp: 20 capsule, Rfl: 0   Ascorbic Acid  (VITAMIN C) 1000 MG tablet, Take 1,000 mg by mouth daily., Disp: , Rfl:    azelastine  (ASTELIN ) 0.1 % nasal spray, Place 1 spray into both nostrils daily as needed for rhinitis., Disp: , Rfl:    B Complex-C (SUPER B  COMPLEX PO), Take 1 tablet by mouth daily., Disp: , Rfl:    brimonidine  (ALPHAGAN ) 0.2 % ophthalmic solution, Place 1 drop into the right eye in the morning and at bedtime., Disp: , Rfl:    dicyclomine  (BENTYL ) 10 MG capsule, Take 10 mg by mouth 3 (three) times daily as needed for spasms., Disp: , Rfl:    Empagliflozin-metFORMIN  HCl ER 12.03-999 MG TB24, Take by mouth., Disp: , Rfl:    ezetimibe  (ZETIA ) 10 MG tablet, Take 5 mg by mouth  daily., Disp: , Rfl:    fluticasone  (FLONASE ) 50 MCG/ACT nasal spray, Place 1 spray into both nostrils daily as needed for allergies., Disp: , Rfl:    latanoprost  (XALATAN ) 0.005 % ophthalmic solution, Place 1 drop into the right eye at bedtime., Disp: , Rfl:    Multiple Vitamins-Minerals (MULTIVITAMINS THER. W/MINERALS) TABS, Take 1 tablet by mouth daily., Disp: , Rfl:    naloxone (NARCAN) nasal spray 4 mg/0.1 mL, Place 0.4 mg into the nose as needed., Disp: , Rfl:    nitroGLYCERIN  (NITROSTAT ) 0.4 MG SL tablet, PLACE 1 TABLET UNDER THE TONGUE EVERY 5 MINUTES AS NEEDED FOR CHEST PAIN., Disp: 25 tablet, Rfl: 1   pantoprazole  (PROTONIX ) 40 MG tablet, Take 40 mg by mouth daily., Disp: , Rfl:    Rimegepant Sulfate (NURTEC) 75 MG TBDP, Take 1 tablet (75 mg total) by mouth daily as needed. For migraines. Take as close to onset of migraine as possible. One daily maximum., Disp: 10 tablet, Rfl: 11   rosuvastatin  (CRESTOR ) 40 MG tablet, Take 40 mg by mouth daily., Disp: , Rfl:    tamsulosin  (FLOMAX ) 0.4 MG CAPS capsule, TAKE 1 CAPSULE BY MOUTH EVERY DAY, Disp: 30 capsule, Rfl: 1   traMADol  (ULTRAM ) 50 MG tablet, Take 100 mg by mouth every 6 (six) hours as needed (migraines)., Disp: , Rfl:    vitamin E  180 MG (400 UNITS) capsule, Take 400 Units by mouth daily., Disp: , Rfl:    Review of Systems:   Negative unless otherwise specified per HPI.  Vitals:   Vitals:   05/30/24 1113  BP: 110/60  Pulse: 70  Temp: 98.3 F (36.8 C)  TempSrc: Temporal  SpO2: 95%  Weight: 203  lb (92.1 kg)  Height: 6' 1 (1.854 m)     Body mass index is 26.78 kg/m.  Physical Exam:   Physical Exam Vitals and nursing note reviewed.  Constitutional:      General: He is not in acute distress.    Appearance: He is well-developed. He is not ill-appearing or toxic-appearing.  HENT:     Mouth/Throat:     Pharynx: Posterior oropharyngeal erythema present.  Cardiovascular:     Rate and Rhythm: Normal rate and regular rhythm.     Pulses: Normal pulses.     Heart sounds: Normal heart sounds, S1 normal and S2 normal.  Pulmonary:     Effort: Pulmonary effort is normal.     Breath sounds: Normal breath sounds.  Musculoskeletal:     Comments: Limited range of motion of right shoulder  Skin:    General: Skin is warm and dry.  Neurological:     Mental Status: He is alert.     GCS: GCS eye subscore is 4. GCS verbal subscore is 5. GCS motor subscore is 6.  Psychiatric:        Speech: Speech normal.        Behavior: Behavior normal. Behavior is cooperative.    Results for orders placed or performed in visit on 05/30/24  POCT rapid strep A  Result Value Ref Range   Rapid Strep A Screen Negative Negative     Assessment and Plan:   1. Fatigue, unspecified type (Primary) - POCT urinalysis dipstick - CBC with Differential/Platelet - Comprehensive metabolic panel with GFR - POCT rapid strep A - Hemoglobin A1c - Urine Culture - Sedimentation rate - C-reactive protein - Urinalysis, Routine w reflex microscopic; Future  Unclear etiology Unfortunately he was unable to give us  a urine sample today  Recommend resuming Flomax  given history of prostatitis -- we did discuss starting empiric treatment(s) for this however, his suspicion is low given his other family members have had this recently and this does not have similar symptom(s) to past episodes of prostatitis  Update blood work Strep test negative Suspect viral upper respiratory infection (URI) however if symptom(s) progress  or change, I have asked him to let me know   2. Chronic right shoulder pain - CBC with Differential/Platelet - Comprehensive metabolic panel with GFR - ketorolac  (TORADOL ) injection 60 mg - Ambulatory referral to Sports Medicine  Referral to sports medicine  3. Sore throat - POCT rapid strep A  Negative strep test Suspect viral upper respiratory infection (URI) Continue to monitor, may take Tylenol ; received Toradol  shot today  4. Type 2 diabetes mellitus with hyperglycemia, without long-term current use of insulin  ([name])   Update Hemoglobin A1c per patient request Will forward results to Primary Care Provider (PCP) for management if Hemoglobin A1c drastically changed  I, Lavern Simmers, acting as a Neurosurgeon for [employer], [place]., have documented all relevant documentation on the behalf of Lucie Buttner, [place], as directed by Lucie Buttner, PA while in the presence of Lucie Buttner, [place].  I, Lucie Buttner, [place], have reviewed all documentation for this visit. The documentation on 05/30/24 for the exam, diagnosis, procedures, and orders are all accurate and complete.  I spent a total of 42 minutes on this visit, today 05/30/24, which included reviewing previous notes from primary care provided, ordering tests, discussing plan of care with patient and using shared-decision making on next steps, and documenting the findings in the note.   Lucie Buttner, PA-C

## 2024-05-31 ENCOUNTER — Ambulatory Visit: Payer: Self-pay | Admitting: Physician Assistant

## 2024-05-31 LAB — URINALYSIS, ROUTINE W REFLEX MICROSCOPIC
Bilirubin Urine: NEGATIVE
Hgb urine dipstick: NEGATIVE
Ketones, ur: NEGATIVE
Leukocytes,Ua: NEGATIVE
Nitrite: NEGATIVE
Specific Gravity, Urine: 1.025 (ref 1.000–1.030)
Total Protein, Urine: NEGATIVE
Urine Glucose: NEGATIVE
Urobilinogen, UA: 0.2 (ref 0.0–1.0)
pH: 6 (ref 5.0–8.0)

## 2024-05-31 LAB — URINE CULTURE
MICRO NUMBER:: 16712278
Result:: NO GROWTH
SPECIMEN QUALITY:: ADEQUATE

## 2024-06-25 ENCOUNTER — Encounter: Payer: Self-pay | Admitting: Family Medicine

## 2024-06-25 ENCOUNTER — Ambulatory Visit: Payer: Medicare Other | Admitting: Family Medicine

## 2024-06-25 ENCOUNTER — Ambulatory Visit: Payer: Self-pay | Admitting: Family Medicine

## 2024-06-25 VITALS — BP 115/61 | HR 66 | Temp 97.2°F | Ht 73.0 in | Wt 199.4 lb

## 2024-06-25 DIAGNOSIS — Z0001 Encounter for general adult medical examination with abnormal findings: Secondary | ICD-10-CM

## 2024-06-25 DIAGNOSIS — E1165 Type 2 diabetes mellitus with hyperglycemia: Secondary | ICD-10-CM | POA: Diagnosis not present

## 2024-06-25 DIAGNOSIS — M1990 Unspecified osteoarthritis, unspecified site: Secondary | ICD-10-CM

## 2024-06-25 DIAGNOSIS — E1169 Type 2 diabetes mellitus with other specified complication: Secondary | ICD-10-CM | POA: Diagnosis not present

## 2024-06-25 DIAGNOSIS — Z0000 Encounter for general adult medical examination without abnormal findings: Secondary | ICD-10-CM | POA: Diagnosis not present

## 2024-06-25 DIAGNOSIS — Z7984 Long term (current) use of oral hypoglycemic drugs: Secondary | ICD-10-CM | POA: Diagnosis not present

## 2024-06-25 DIAGNOSIS — E785 Hyperlipidemia, unspecified: Secondary | ICD-10-CM | POA: Diagnosis not present

## 2024-06-25 DIAGNOSIS — I152 Hypertension secondary to endocrine disorders: Secondary | ICD-10-CM

## 2024-06-25 DIAGNOSIS — E1159 Type 2 diabetes mellitus with other circulatory complications: Secondary | ICD-10-CM

## 2024-06-25 LAB — COMPREHENSIVE METABOLIC PANEL WITH GFR
ALT: 16 U/L (ref 0–53)
AST: 19 U/L (ref 0–37)
Albumin: 4.3 g/dL (ref 3.5–5.2)
Alkaline Phosphatase: 44 U/L (ref 39–117)
BUN: 14 mg/dL (ref 6–23)
CO2: 31 meq/L (ref 19–32)
Calcium: 9.4 mg/dL (ref 8.4–10.5)
Chloride: 101 meq/L (ref 96–112)
Creatinine, Ser: 0.71 mg/dL (ref 0.40–1.50)
GFR: 86.62 mL/min (ref >60.00)
Glucose, Bld: 118 mg/dL — ABNORMAL HIGH (ref 70–99)
Potassium: 4.7 meq/L (ref 3.5–5.1)
Sodium: 139 meq/L (ref 135–145)
Total Bilirubin: 0.5 mg/dL (ref 0.2–1.2)
Total Protein: 6.9 g/dL (ref 6.0–8.3)

## 2024-06-25 LAB — MICROALBUMIN / CREATININE URINE RATIO
Creatinine,U: 120.6 mg/dL
Microalb Creat Ratio: UNDETERMINED mg/g (ref 0.0–30.0)
Microalb, Ur: <0.7 mg/dL

## 2024-06-25 LAB — CBC
HCT: 38.7 % — ABNORMAL LOW (ref 39.0–52.0)
Hemoglobin: 12.9 g/dL — ABNORMAL LOW (ref 13.0–17.0)
MCHC: 33.2 g/dL (ref 30.0–36.0)
MCV: 94.5 fl (ref 78.0–100.0)
Platelets: 146 K/uL — ABNORMAL LOW (ref 150.0–400.0)
RBC: 4.1 Mil/uL — ABNORMAL LOW (ref 4.22–5.81)
RDW: 14.7 % (ref 11.5–15.5)
WBC: 5.6 K/uL (ref 4.0–10.5)

## 2024-06-25 LAB — LIPID PANEL
Cholesterol: 132 mg/dL (ref 0–200)
HDL: 56 mg/dL (ref >39.00)
LDL Cholesterol: 51 mg/dL (ref 0–99)
NonHDL: 75.61
Total CHOL/HDL Ratio: 2
Triglycerides: 122 mg/dL (ref 0.0–149.0)
VLDL: 24.4 mg/dL (ref 0.0–40.0)

## 2024-06-25 LAB — HEMOGLOBIN A1C: Hgb A1c MFr Bld: 7 % — ABNORMAL HIGH (ref 4.6–6.5)

## 2024-06-25 LAB — TSH: TSH: 1.78 u[IU]/mL (ref 0.35–5.50)

## 2024-06-25 MED ORDER — METFORMIN HCL 1000 MG PO TABS: 1000.0000 mg | ORAL_TABLET | Freq: Two times a day (BID) | ORAL | Status: AC

## 2024-06-25 NOTE — Progress Notes (Signed)
 His labs are all stable.  Hemoglobin is 12.9 which is similar to a few weeks ago and near his baseline.  His A1c is well-controlled at 7.0.  His cholesterol levels are at goal.    Do not need to make any changes to his treatment plan at this time.  I would like to see him back in 6 months to recheck his A1c.  We can recheck everything else in a year.

## 2024-06-25 NOTE — Assessment & Plan Note (Signed)
 Check lipids

## 2024-06-25 NOTE — Progress Notes (Signed)
 Chief Complaint:  Robert [name] is a 78 y.o. male who presents today for his annual comprehensive physical exam.    Assessment/Plan:  Chronic Problems Addressed Today: Type 2 diabetes mellitus with hyperglycemia ([name]) On metformin  1000 mg twice daily.  Recheck A1c today.  Doing well with current regimen.  Previously did not tolerate Synjardy due to GI side effects.  Hyperlipidemia associated with type 2 diabetes mellitus ([name]) Check lipids.  Hypertension associated with diabetes ([name]) At goal today without medications.  Osteoarthritis Follows with orthopedics.  Recently started using turmeric supplement which has worked well.  Voltaren  also works well.  Preventative Healthcare: Check labs.  Up-to-date on vaccines.  Patient Counseling(The following topics were reviewed and/or handout was given):  -Nutrition: Stressed importance of moderation in sodium/caffeine intake, saturated fat and cholesterol, caloric balance, sufficient intake of fresh fruits, vegetables, and fiber.  -Stressed the importance of regular exercise.   -Substance Abuse: Discussed cessation/primary prevention of tobacco, alcohol , or other drug use; driving or other dangerous activities under the influence; availability of treatment for abuse.   -Injury prevention: Discussed safety belts, safety helmets, smoke detector, smoking near bedding or upholstery.   -Sexuality: Discussed sexually transmitted diseases, partner selection, use of condoms, avoidance of unintended pregnancy and contraceptive alternatives.   -Dental health: Discussed importance of regular tooth brushing, flossing, and dental visits.  -Health maintenance and immunizations reviewed. Please refer to Health maintenance section.  Return to care in 1 year for next preventative visit.     Subjective:  HPI:  He has no acute complaints today. Patient is here today for his annual physical.  See assessment / plan for status of chronic  conditions.  Discussed the use of AI scribe software for clinical note transcription with the patient, who gave verbal consent to proceed.  History of Present Illness Robert [name] is an 78 year old male who presents for an annual physical exam.  He has been experiencing significant right shoulder pain recently, similar to an issue he had with his other shoulder 25 years ago, diagnosed as impingement. He uses Voltaren  and turmeric for pain management, with significant improvement noted, especially with turmeric, which alleviates most of his joint pain, including in his knees and hands.  He has a history of diabetes and is currently taking metformin  1000 mg twice daily, which he tolerates well despite some gastrointestinal discomfort. He previously tried empagliflozin but discontinued it due to severe stomach upset. He is mindful of his diet, avoiding sugar and using sugar alcohols in moderation. He remains active through daily activities but does not engage in structured physical exercise.     Lifestyle Diet: Trying to stay from sugar and carbohydrates.  Exercise: Limited.      06/25/2024    7:42 AM  Depression screen PHQ 2/9  Decreased Interest 0  Down, Depressed, Hopeless 0  PHQ - 2 Score 0    Health Maintenance Due  Topic Date Due   Diabetic kidney evaluation - Urine ACR  10/20/2012   OPHTHALMOLOGY EXAM  11/14/2019   FOOT EXAM  05/28/2020     ROS: Per HPI, otherwise a complete review of systems was negative.   PMH:  The following were reviewed and entered/updated in epic: Past Medical History:  Diagnosis Date   Arthritis    CAD (coronary artery disease) 10/10/2011   NSTEMI with DES to the proximal RCA following flow wire evaluation. EF is normal.    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    left   Squamous cell carcinoma 12/17/2016   Stroke ([name])    found on MRI - pt unaware had stroke approx 7 years ago   Patient Active Problem List   Diagnosis Date Noted   Osteoarthritis 12/16/2022   Chronic migraine without aura, with intractable migraine, so stated, with status migrainosus 09/20/2021   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 Luis 01/07/2008   GERD 04/02/2007   Sleep apnea 04/02/2007   Past Surgical History:  Procedure Laterality Date   BACK SURGERY     CATARACT EXTRACTION Bilateral 2019   CATARACT EXTRACTION W/ INTRAOCULAR LENS IMPLANT Bilateral 01/2019   CERVICAL DISC SURGERY  1980's?   CORONARY ANGIOPLASTY WITH STENT PLACEMENT  10/13/2011   DES to the proximal RCA. Dr Swaziland   FRACTIONAL FLOW RESERVE WIRE Right 10/13/2011   Procedure: FRACTIONAL FLOW RESERVE WIRE;  Surgeon: Peter M Swaziland, MD;  Location: [facility] CATH LAB;  Service: Cardiovascular;  Laterality: Right;   HERNIA REPAIR     INGUINAL HERNIA REPAIR  10/23/2012   Procedure: HERNIA REPAIR INGUINAL ADULT;  Surgeon: Krystal JINNY Russell, MD;  Location: [facility] OR;  Service: General;  Laterality: Left;  Left lower quadrant; left inguinal hernia repair with mesh   INSERTION OF MESH  10/23/2012   Procedure: INSERTION OF MESH;  Surgeon: Krystal JINNY Russell, MD;  Location: [facility];  Service: General;  Laterality: Left;  Left lower quadrant   LEFT HEART CATHETERIZATION WITH CORONARY ANGIOGRAM N/A 10/13/2011   Procedure: LEFT HEART CATHETERIZATION WITH CORONARY ANGIOGRAM;  Surgeon: Peter M Swaziland, MD;  Location: [facility] CATH LAB;  Service: Cardiovascular;  Laterality: N/A;   PERCUTANEOUS CORONARY STENT INTERVENTION (PCI-S) Right 10/13/2011   Procedure: PERCUTANEOUS CORONARY STENT INTERVENTION (PCI-S);  Surgeon: Peter M Swaziland, MD;  Location: [facility] CATH LAB;  Service: Cardiovascular;  Laterality: Right;   SHOULDER ARTHROSCOPY W/ ROTATOR CUFF REPAIR Left 1990's   TOTAL HIP ARTHROPLASTY Left 05/14/2021   Procedure: LEFT TOTAL HIP ARTHROPLASTY ANTERIOR APPROACH;  Surgeon: Vernetta Lonni GRADE, MD;  Location: [facility] ORS;  Service: Orthopedics;  Laterality: Left;   TOTAL KNEE ARTHROPLASTY Right 02/20/2023   Procedure: TOTAL KNEE ARTHROPLASTY;  Surgeon: Melodi Lerner, MD;  Location: [facility] ORS;  Service: Orthopedics;  Laterality: Right;   TRIGGER FINGER RELEASE Left    3rd and 4th digits    Family History  Problem Relation Age of Onset   Cancer Mother        lung   Heart disease Father        heart attack    Cancer Father  colon   Migraines Neg Hx    Headache Neg Hx     Medications- reviewed and updated Current Outpatient Medications  Medication Sig Dispense Refill   acetaminophen  (TYLENOL ) 500 MG tablet Take 1,000 mg by mouth every 6 (six) hours as needed for moderate pain.     Ascorbic Acid  (VITAMIN C) 1000 MG tablet Take 1,000 mg by mouth daily.     azelastine  (ASTELIN ) 0.1 % nasal spray Place 1 spray into both nostrils daily as needed for rhinitis.     B Complex-C (SUPER B COMPLEX PO) Take 1 tablet by mouth daily.     brimonidine  (ALPHAGAN ) 0.2 % ophthalmic solution Place 1 drop into the right eye in the morning and at bedtime.     dicyclomine  (BENTYL ) 10 MG capsule Take 10 mg by mouth 3 (three) times daily as needed for spasms.      ezetimibe  (ZETIA ) 10 MG tablet Take 5 mg by mouth daily.     fluticasone  (FLONASE ) 50 MCG/ACT nasal spray Place 1 spray into both nostrils daily as needed for allergies.     latanoprost  (XALATAN ) 0.005 % ophthalmic solution Place 1 drop into the right eye at bedtime.     metFORMIN  (GLUCOPHAGE ) 1000 MG tablet Take 1 tablet (1,000 mg total) by mouth 2 (two) times daily with a meal.     Multiple Vitamins-Minerals (MULTIVITAMINS THER. W/MINERALS) TABS Take 1 tablet by mouth daily.     naloxone (NARCAN) nasal spray 4 mg/0.1 mL Place 0.4 mg into the nose as needed.     nitroGLYCERIN  (NITROSTAT ) 0.4 MG SL tablet PLACE 1 TABLET UNDER THE TONGUE EVERY 5 MINUTES AS NEEDED FOR CHEST PAIN. 25 tablet 1   pantoprazole  (PROTONIX ) 40 MG tablet Take 40 mg by mouth daily.     Rimegepant Sulfate (NURTEC) 75 MG TBDP Take 1 tablet (75 mg total) by mouth daily as needed. For migraines. Take as close to onset of migraine as possible. One daily maximum. 10 tablet 11   rosuvastatin  (CRESTOR ) 40 MG tablet Take 40 mg by mouth daily.     tamsulosin  (FLOMAX ) 0.4 MG CAPS capsule TAKE 1 CAPSULE BY MOUTH EVERY DAY 30 capsule 1   traMADol  (ULTRAM ) 50 MG tablet Take 100 mg by mouth every 6 (six) hours as needed (migraines).     vitamin E  180 MG (400 UNITS) capsule Take 400 Units by mouth daily.     amoxicillin  (AMOXIL ) 500 MG capsule Take 4 capsules prior to dental procedures 20 capsule 0   No current facility-administered medications for this visit.    Allergies-reviewed and updated Allergies  Allergen Reactions   Sumatriptan Shortness Of Breath    REACTION: sob, headache scalp sensitivity   Penicillins Itching    Tolerated Cephalosporin Date: 02/21/23.     Empagliflozin Diarrhea   Lipitor [Atorvastatin ] Nausea And Vomiting   Oxycodone  Itching   Simbrinza [Brinzolamide-Brimonidine ]     Itching of eye    Social History   Socioeconomic History   Marital status: Married    Spouse name: Not on file   Number of  children: Not on file   Years of education: Not on file   Highest education level: 12th grade  Occupational History    Comment: Retired -[employer]  Tobacco Use   Smoking status: Former    Current packs/day: 0.00    Average packs/day: 2.0 packs/day for 20.0 years (40.0 ttl pk-yrs)    Types: Cigarettes    Start date: 11/14/1962    Quit date: 11/14/1982  Years since quitting: 41.6   Smokeless tobacco: Never  Vaping Use   Vaping status: Never Used  Substance and Sexual Activity   Alcohol  use: Yes    Comment: occ   Drug use: No   Sexual activity: Not on file  Other Topics Concern   Not on file  Social History Narrative   Pt lives with his wife. 1 and a half story home  No issues with stairs. 12th grade education   Social Drivers of Health   Financial Resource Strain: Low Risk  (09/21/2023)   Overall Financial Resource Strain (CARDIA)    Difficulty of Paying Living Expenses: Not hard at all  Food Insecurity: No Food Insecurity (09/21/2023)   Hunger Vital Sign    Worried About Running Out of Food in the Last Year: Never true    Ran Out of Food in the Last Year: Never true  Transportation Needs: No Transportation Needs (09/21/2023)   PRAPARE - Administrator, Civil Service (Medical): No    Lack of Transportation (Non-Medical): No  Physical Activity: Inactive (09/21/2023)   Exercise Vital Sign    Days of Exercise per Week: 0 days    Minutes of Exercise per Session: 0 min  Stress: No Stress Concern Present (09/21/2023)   [employer] of Occupational Health - Occupational Stress Questionnaire    Feeling of Stress : Not at all  Social Connections: Moderately Integrated (09/21/2023)   Social Connection and Isolation Panel    Frequency of Communication with Friends and Family: More than three times a week    Frequency of Social Gatherings with Friends and Family: More than three times a week    Attends Religious Services: Never    Database administrator or Organizations: Yes     Attends Engineer, structural: 1 to 4 times per year    Marital Status: Married        Objective:  Physical Exam: BP 115/61   Pulse 66   Temp (!) 97.2 F (36.2 C) (Temporal)   Ht 6' 1 (1.854 m)   Wt 199 lb 6.4 oz (90.4 kg)   SpO2 96%   BMI 26.31 kg/m   Body mass index is 26.31 kg/m. Wt Readings from Last 3 Encounters:  06/25/24 199 lb 6.4 oz (90.4 kg)  05/30/24 203 lb (92.1 kg)  04/10/24 203 lb 3.2 oz (92.2 kg)   Gen: NAD, resting comfortably HEENT: TMs normal bilaterally. OP clear. No thyromegaly noted.  CV: RRR with no murmurs appreciated Pulm: NWOB, CTAB with no crackles, wheezes, or rhonchi GI: Normal bowel sounds present. Soft, Nontender, Nondistended. MSK: no edema, cyanosis, or clubbing noted Skin: warm, dry Neuro: CN2-12 grossly intact. Strength 5/5 in upper and lower extremities. Reflexes symmetric and intact bilaterally.  Psych: Normal affect and thought content     Nino Amano M. Kennyth, MD 06/25/2024 8:00 AM

## 2024-06-25 NOTE — Assessment & Plan Note (Signed)
 Follows with orthopedics.  Recently started using turmeric supplement which has worked well.  Voltaren  also works well.

## 2024-06-25 NOTE — Patient Instructions (Addendum)
 It was very nice to see you today!  VISIT SUMMARY: You came in for your annual physical exam. We discussed your right shoulder pain, diabetes management, and eye health. Your vitals are stable, and we have planned some follow-up tests and visits.  YOUR PLAN: ADULT WELLNESS VISIT: Routine visit with stable vitals and no acute issues. -Order blood work to update lab values. -Schedule follow-up visit in six months.  TYPE 2 DIABETES MELLITUS WITH HYPERGLYCEMIA: Your diabetes is managed with metformin , and you are mindful of your diet and activity levels. -Order A1c test with blood work. -Continue metformin  1000 mg twice daily. -Encourage diabetic eye exam at upcoming eye doctor appointment. -Reassess diabetes management in six months.  OSTEOARTHRITIS, MULTIPLE JOINTS: You have chronic osteoarthritis with symptom improvement using turmeric and Voltaren . -Continue using turmeric and Voltaren  as needed for pain management.   Return in about 6 months (around 12/26/2024).   Take care, Dr Kennyth  PLEASE NOTE:  If you had any lab tests, please let us  know if you have not heard back within a few days. You may see your results on mychart before we have a chance to review them but we will give you a call once they are reviewed by us .   If we ordered any referrals today, please let us  know if you have not heard from their office within the next week.   If you had any urgent prescriptions sent in today, please check with the pharmacy within an hour of our visit to make sure the prescription was transmitted appropriately.   Please try these tips to maintain a healthy lifestyle:  Eat at least 3 REAL meals and 1-2 snacks per day.  Aim for no more than 5 hours between eating.  If you eat breakfast, please do so within one hour of getting up.   Each meal should contain half fruits/vegetables, one quarter protein, and one quarter carbs (no bigger than a computer mouse)  Cut down on sweet beverages.  This includes juice, soda, and sweet tea.   Drink at least 1 glass of water  with each meal and aim for at least 8 glasses per day  Exercise at least 150 minutes every week.

## 2024-06-25 NOTE — Assessment & Plan Note (Signed)
 At goal today without medications.

## 2024-06-25 NOTE — Assessment & Plan Note (Signed)
 On metformin  1000 mg twice daily.  Recheck A1c today.  Doing well with current regimen.  Previously did not tolerate Synjardy due to GI side effects.

## 2024-08-21 ENCOUNTER — Ambulatory Visit: Payer: Self-pay | Admitting: Adult Health

## 2024-08-21 ENCOUNTER — Encounter: Payer: Self-pay | Admitting: Adult Health

## 2024-08-21 ENCOUNTER — Ambulatory Visit

## 2024-08-21 ENCOUNTER — Ambulatory Visit: Admitting: Adult Health

## 2024-08-21 VITALS — BP 120/80 | HR 58 | Temp 98.1°F | Ht 73.0 in | Wt 201.0 lb

## 2024-08-21 VITALS — Ht 73.0 in | Wt 199.0 lb

## 2024-08-21 DIAGNOSIS — R5383 Other fatigue: Secondary | ICD-10-CM

## 2024-08-21 DIAGNOSIS — Z0000 Encounter for general adult medical examination without abnormal findings: Secondary | ICD-10-CM

## 2024-08-21 DIAGNOSIS — R110 Nausea: Secondary | ICD-10-CM

## 2024-08-21 DIAGNOSIS — B49 Unspecified mycosis: Secondary | ICD-10-CM

## 2024-08-21 LAB — CBC
HCT: 42.1 % (ref 39.0–52.0)
Hemoglobin: 13.8 g/dL (ref 13.0–17.0)
MCHC: 32.9 g/dL (ref 30.0–36.0)
MCV: 95.4 fl (ref 78.0–100.0)
Platelets: 167 K/uL (ref 150.0–400.0)
RBC: 4.41 Mil/uL (ref 4.22–5.81)
RDW: 14.3 % (ref 11.5–15.5)
WBC: 5.5 K/uL (ref 4.0–10.5)

## 2024-08-21 LAB — BASIC METABOLIC PANEL WITH GFR
BUN: 15 mg/dL (ref 6–23)
CO2: 30 meq/L (ref 19–32)
Calcium: 9.7 mg/dL (ref 8.4–10.5)
Chloride: 103 meq/L (ref 96–112)
Creatinine, Ser: 0.78 mg/dL (ref 0.40–1.50)
GFR: 84.11 mL/min (ref >60.00)
Glucose, Bld: 105 mg/dL — ABNORMAL HIGH (ref 70–99)
Potassium: 4.5 meq/L (ref 3.5–5.1)
Sodium: 139 meq/L (ref 135–145)

## 2024-08-21 MED ORDER — FLUCONAZOLE 150 MG PO TABS: ORAL_TABLET | ORAL | 0 refills | Status: AC

## 2024-08-21 MED ORDER — KETOCONAZOLE 2 % EX CREA: 1.0000 | TOPICAL_CREAM | Freq: Every day | CUTANEOUS | 0 refills | Status: AC

## 2024-08-21 NOTE — Progress Notes (Addendum)
 Subjective:   Robert [name] is a 78 y.o. who presents for a Medicare Wellness preventive visit.  As a reminder, Annual Wellness Visits don't include a physical exam, and some assessments may be limited, especially if this visit is performed virtually. We may recommend an in-person follow-up visit with your provider if needed.  Visit Complete: Virtual I connected with  Robert [name] on 08/21/24 by a audio enabled telemedicine application and verified that I am speaking with the correct person using two identifiers.  Patient Location: Home  Provider Location: Home Office  I discussed the limitations of evaluation and management by telemedicine. The patient expressed understanding and agreed to proceed.  Vital Signs: Because this visit was a virtual/telehealth visit, some criteria may be missing or patient reported. Any vitals not documented were not able to be obtained and vitals that have been documented are patient reported.  VideoDeclined- This patient declined Librarian, academic. Therefore the visit was completed with audio only.  Persons Participating in Visit: Patient.  AWV Questionnaire: No: Patient Medicare AWV questionnaire was not completed prior to this visit.  Cardiac Risk Factors include: advanced age (>43men, >14 women);dyslipidemia;hypertension;male gender;diabetes mellitus     Objective:    Today's Vitals   08/21/24 0845  Weight: 199 lb (90.3 kg)  Height: 6' 1 (1.854 m)   Body mass index is 26.25 kg/m.     08/21/2024    8:55 AM 09/21/2023    9:53 AM 03/19/2023    4:10 PM 03/04/2023   12:57 PM 02/20/2023    6:20 PM 02/07/2023    9:03 AM 12/07/2022    8:05 AM  Advanced Directives  Does Patient Have a Medical Advance Directive? Yes Yes Yes Yes Yes Yes Yes  Type of Estate agent of [place];Living will Healthcare Power of [place];Living will   Healthcare Power of [employer] of [place];Living will  Healthcare Power of [place];Living will  Does patient want to make changes to medical advance directive?     No - Patient declined  No - Patient declined  Copy of Healthcare Power of Attorney in Chart? No - copy requested No - copy requested         Current Medications (verified) Outpatient Encounter Medications as of 08/21/2024  Medication Sig   acetaminophen  (TYLENOL ) 500 MG tablet Take 1,000 mg by mouth every 6 (six) hours as needed for moderate pain.   Ascorbic Acid  (VITAMIN C) 1000 MG tablet Take 1,000 mg by mouth daily.   azelastine  (ASTELIN ) 0.1 % nasal spray Place 1 spray into both nostrils daily as needed for rhinitis.   B Complex-C (SUPER B COMPLEX PO) Take 1 tablet by mouth daily.   brimonidine  (ALPHAGAN ) 0.2 % ophthalmic solution Place 1 drop into the right eye in the morning and at bedtime.   dicyclomine  (BENTYL ) 10 MG capsule Take 10 mg by mouth 3 (three) times daily as needed for spasms.   ezetimibe  (ZETIA ) 10 MG tablet Take 5 mg by mouth daily.   fluticasone  (FLONASE ) 50 MCG/ACT nasal spray Place 1 spray into both nostrils daily as needed for allergies.   latanoprost  (XALATAN ) 0.005 % ophthalmic solution Place 1 drop into the right eye at bedtime.   metFORMIN  (GLUCOPHAGE ) 1000 MG tablet Take 1 tablet (1,000 mg total) by mouth 2 (two) times daily with a meal.   Multiple Vitamins-Minerals (MULTIVITAMINS THER. W/MINERALS) TABS Take 1 tablet by mouth daily.   naloxone (NARCAN) nasal spray 4 mg/0.1 mL Place 0.4  mg into the nose as needed.   nitroGLYCERIN  (NITROSTAT ) 0.4 MG SL tablet PLACE 1 TABLET UNDER THE TONGUE EVERY 5 MINUTES AS NEEDED FOR CHEST PAIN.   pantoprazole  (PROTONIX ) 40 MG tablet Take 40 mg by mouth daily.   Rimegepant Sulfate (NURTEC) 75 MG TBDP Take 1 tablet (75 mg total) by mouth daily as needed. For migraines. Take as close to onset of migraine as possible. One daily maximum.   rosuvastatin  (CRESTOR ) 40 MG tablet Take 40 mg by mouth daily.   tamsulosin  (FLOMAX )  0.4 MG CAPS capsule TAKE 1 CAPSULE BY MOUTH EVERY DAY   traMADol  (ULTRAM ) 50 MG tablet Take 100 mg by mouth every 6 (six) hours as needed (migraines).   vitamin E  180 MG (400 UNITS) capsule Take 400 Units by mouth daily.   [DISCONTINUED] amoxicillin  (AMOXIL ) 500 MG capsule Take 4 capsules prior to dental procedures   No facility-administered encounter medications on file as of 08/21/2024.    Allergies (verified) Sumatriptan, Penicillins, Empagliflozin, Lipitor [atorvastatin ], Oxycodone , and Simbrinza [brinzolamide-brimonidine ]   History: Past Medical History:  Diagnosis Date   Arthritis    CAD (coronary artery disease) 10/10/2011   NSTEMI with DES to the proximal RCA following flow wire evaluation. EF is normal.    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   CORONARY ANGIOPLASTY WITH STENT PLACEMENT  10/13/2011   DES to the proximal RCA. Dr Swaziland   FRACTIONAL FLOW RESERVE WIRE Right 10/13/2011   Procedure: FRACTIONAL FLOW RESERVE WIRE;  Surgeon: Peter M Swaziland, MD;  Location: [facility] CATH LAB;  Service: Cardiovascular;  Laterality: Right;   HERNIA REPAIR     INGUINAL HERNIA REPAIR  10/23/2012   Procedure: HERNIA REPAIR INGUINAL ADULT;  Surgeon: Krystal JINNY Russell, MD;  Location[facility] OR;  Service: General;  Laterality: Left;  Left  lower quadrant; left inguinal hernia repair with mesh   INSERTION OF MESH  10/23/2012   Procedure: INSERTION OF MESH;  Surgeon: Krystal JINNY Russell, MD;  Location: [facility] OR;  Service: General;  Laterality: Left;  Left lower quadrant   LEFT HEART CATHETERIZATION WITH CORONARY ANGIOGRAM N/A 10/13/2011   Procedure: LEFT HEART CATHETERIZATION WITH CORONARY ANGIOGRAM;  Surgeon: Peter M Swaziland, MD;  Location: [facility] CATH LAB;  Service: Cardiovascular;  Laterality: N/A;   PERCUTANEOUS CORONARY STENT INTERVENTION (PCI-S) Right 10/13/2011   Procedure: PERCUTANEOUS CORONARY STENT INTERVENTION (PCI-S);  Surgeon: Peter M [place]az[place], MD;  Location: [facility] CATH LAB;  Service: Cardiovascular;  Laterality: Right;   SHOULDER ARTHROSCOPY W/ ROTATOR CUFF REPAIR Left 1990's   TOTAL HIP ARTHROPLASTY Left 05/14/2021   Procedure: LEFT TOTAL HIP ARTHROPLASTY ANTERIOR APPROACH;  Surgeon: Vernetta Lonni GRADE, MD;  Location: [facility] ORS;  Service:  Orthopedics;  Laterality: Left;   TOTAL KNEE ARTHROPLASTY Right 02/20/2023   Procedure: TOTAL KNEE ARTHROPLASTY;  Surgeon: Melodi Lerner, MD;  Location: [facility] ORS;  Service: Orthopedics;  Laterality: Right;   TRIGGER FINGER RELEASE Left    3rd and 4th digits   Family History  Problem Relation Age of Onset   Cancer Mother        lung   Heart disease Father        heart attack    Cancer Father        colon   Migraines Neg Hx    Headache Neg Hx    Social History   Socioeconomic History   Marital status: Married    Spouse name: Not on file   Number of children: Not on file   Years of education: Not on file   Highest education level: 12th grade  Occupational History    Comment: Retired -[employer]  Tobacco Use   Smoking status: Former    Current packs/day: 0.00    Average packs/day: 2.0 packs/day for 20.0 years (40.0 ttl pk-yrs)    Types: Cigarettes    Start date: 11/14/1962    Quit date: 11/14/1982    Years since quitting: 41.7   Smokeless tobacco: Never  Vaping Use   Vaping status: Never  Used  Substance and Sexual Activity   Alcohol  use: Not Currently    Comment: occ   Drug use: No   Sexual activity: Not on file  Other Topics Concern   Not on file  Social History Narrative   Pt lives with his wife. 1 and a half story home  No issues with stairs. 12th grade education   Social Drivers of Health   Financial Resource Strain: Low Risk  (08/21/2024)   Overall Financial Resource Strain (CARDIA)    Difficulty of Paying Living Expenses: Not hard at all  Food Insecurity: No Food Insecurity (08/21/2024)   Hunger Vital Sign    Worried About Running Out of Food in the Last Year: Never true    Ran Out of Food in the Last Year: Never true  Transportation Needs: No Transportation Needs (08/21/2024)   PRAPARE - Administrator, Civil Service (Medical): No    Lack of Transportation (Non-Medical): No  Physical Activity: Inactive (08/21/2024)   Exercise Vital Sign    Days of Exercise per Week: 0 days    Minutes of Exercise per Session: 0 min  Stress: No Stress Concern Present (08/21/2024)   [employer] of Occupational Health - Occupational Stress Questionnaire    Feeling of Stress: Not at all  Social Connections: Moderately Integrated (08/21/2024)   Social Connection and Isolation Panel    Frequency of Communication with Friends and Family: Twice a week    Frequency of Social Gatherings with Friends and Family: More than three times a week    Attends Religious Services: Never    Database administrator or Organizations: Yes    Attends Engineer, structural: 1 to 4 times per year    Marital Status: Married    Tobacco Counseling Counseling given: Not Answered    Clinical Intake:  Pre-visit preparation completed: Yes  Pain : 0-10 Pain Type: Acute pain Pain Location: Groin Pain Descriptors / Indicators: Aching Pain Onset: In the past 7 days     BMI - recorded: 26.25 Nutritional Status: BMI 25 -29 Overweight Nutritional Risks: None Diabetes:  Yes CBG done?: No Did pt. bring in CBG monitor from home?: No  Lab Results  Component Value Date   HGBA1C 7.0 (H) 06/25/2024   HGBA1C 7.1 (H) 05/30/2024   HGBA1C 6.3 (A) 12/26/2023     How often do you need to have someone help you when you read instructions, pamphlets, or other written materials from your doctor or pharmacy?: 1 - Never  Interpreter Needed?: No  Information entered by :: Ellouise Haws, LPN   Activities of Daily Living     08/21/2024    8:49 AM 09/21/2023    9:49 AM  In your present state of health, do you have any difficulty performing the following activities:  Hearing? 1 1  Comment has hearing aids has hearing aids  Vision? 0 0  Difficulty concentrating or making decisions? 0 0  Walking or climbing stairs? 0 0  Dressing or bathing? 0 0  Doing errands, shopping? 0 0  Preparing Food and eating ? N N  Using the Toilet? N N  In the past six months, have you accidently leaked urine? Y N  Comment at times   Do you have problems with loss of bowel control? N N  Managing your Medications? N N  Managing your Finances? N N  Housekeeping or managing your Housekeeping? N N    Patient Care Team: Kennyth Worth HERO, MD as PCP - General (Family Medicine) Swaz[place], Peter M, MD as PCP - Cardiology (Cardiology) [facility] as Referring Physician (General Practice) Alix Charleston, MD as Consulting Physician (Neurosurgery) [place], Peter M, MD as Consulting Physician (Cardiology) Cleotilde Sewer, OD (Optometry) Carla Milling, RPH-CPP (Pharmacist)  I have updated your Care Teams any recent Medical Services you may have received from other providers in the past year.     Assessment:   This is a routine wellness examination for [place].  Hearing/Vision screen Hearing Screening - Comments:: Has hearing aids  Vision Screening - Comments:: Wears rx glasses - [facility]    Goals Addressed             This Visit's Progress     Patient Stated       Maintain health and activity        Depression Screen     08/21/2024    8:55 AM 06/25/2024    7:42 AM 02/09/2024    7:49 AM 12/26/2023    7:41 AM 09/21/2023    9:54 AM 06/20/2023    8:15 AM 01/30/2023    2:27 PM  PHQ 2/9 Scores  PHQ - 2 Score 0 0 0 0 0 0 0    Fall Risk     08/21/2024    8:57 AM 06/25/2024    7:43 AM 02/09/2024    7:49 AM 12/26/2023    7:41 AM 09/21/2023    9:55 AM  Fall Risk   Falls in the past year? 0 0 0 0 1  Number falls in past yr: 0 0 0 0 1  Injury with Fall? 0 0 0 0 1  Comment     sore  Risk for fall due to : Impaired balance/gait;Impaired mobility No Fall Risks No Fall Risks No Fall Risks Impaired vision;History of fall(s)  Follow up Falls prevention discussed    Falls prevention discussed    MEDICARE RISK AT HOME:  Medicare Risk at Home Any stairs in or around the home?: Yes If so, are there any without handrails?: No Home free of loose throw rugs in walkways, pet beds, electrical cords, etc?: Yes  Adequate lighting in your home to reduce risk of falls?: Yes Life alert?: No Use of a cane, walker or w/c?: No Grab bars in the bathroom?: Yes Shower chair or bench in shower?: No Elevated toilet seat or a handicapped toilet?: No  TIMED UP AND GO:  Was the test performed?  No  Cognitive Function: 6CIT completed    05/29/2019    9:22 AM 05/03/2017    8:09 AM  MMSE - Mini Mental State Exam  Orientation to time 5 5   Orientation to Place 5 5   Registration 3 3   Attention/ Calculation 5 5   Recall 3 3   Language- name 2 objects  2   Language- repeat 1 1  Language- follow 3 step command 3 3   Language- read & follow direction 1 1   Write a sentence  1   Copy design  1   Total score  30      Data saved with a previous flowsheet row definition        08/21/2024    8:57 AM 09/21/2023    9:56 AM 09/15/2022    9:31 AM 09/02/2021    8:18 AM  6CIT Screen  What Year? 0 points 0 points 0 points 0 points  What month? 0  points 0 points 0 points 0 points  What time? 0 points 0 points 0 points 0 points  Count back from 20 0 points 0 points 0 points 0 points  Months in reverse 2 points 0 points 0 points 0 points  Repeat phrase 4 points 0 points 0 points 4 points  Total Score 6 points 0 points 0 points 4 points    Immunizations Immunization History  Administered Date(s) Administered   Fluad Quad(high Dose 65+) 10/06/2020, 09/02/2021, 09/28/2022   Fluad Trivalent(High Dose 65+) 09/12/2023   INFLUENZA, HIGH DOSE SEASONAL PF 08/10/2012, 09/12/2013, 08/28/2015, 10/19/2016, 09/18/2017, 10/21/2017, 09/12/2018, 09/24/2019   Influenza Whole 11/14/2005   Influenza-Unspecified 09/14/2010, 09/15/2011, 07/15/2013, 10/01/2014, 10/14/2016   Moderna Sars-Covid-2 Vaccination 11/29/2019, 12/27/2019, 09/01/2020   Pneumococcal Conjugate-13 05/06/2014   Pneumococcal Polysaccharide-23 03/03/2009   Pneumococcal-Unspecified 05/14/2010   Td 10/14/2006   Tdap 11/14/2009, 11/30/2017   Zoster Recombinant(Shingrix) 08/30/2017, 02/12/2018, 04/14/2018, 02/13/2024   Zoster, Live 10/28/2009    Screening Tests Health Maintenance  Topic Date Due   FOOT EXAM  05/28/2020   OPHTHALMOLOGY EXAM  04/16/2024   HEMOGLOBIN A1C  12/26/2024   Diabetic kidney evaluation - eGFR measurement  06/25/2025   Diabetic kidney evaluation - Urine ACR  06/25/2025   Medicare Annual Wellness (AWV)  08/21/2025   DTaP/Tdap/Td (4 - Td or Tdap) 12/01/2027   Pneumococcal Vaccine: 50+ Years  Completed   Zoster Vaccines- Shingrix  Completed   Meningococcal B Vaccine  Aged Out   Influenza Vaccine  Discontinued   Colonoscopy  Discontinued   COVID-19 Vaccine  Discontinued   Hepatitis C Screening  Discontinued    Health Maintenance Items Addressed: See Nurse Notes at the end of this note  Additional Screening:  Vision Screening: Recommended annual ophthalmology exams for early detection of glaucoma and other disorders of the eye. Is the patient up to  date with their annual eye exam?  No  Who is the provider or what [facility] the [facility]  Dental Screening: Recommended annual dental exams for proper oral hygiene  Community Resource Referral / Chronic Care Management: CRR required this visit?  No  CCM required this visit?  No   Plan:    I have personally reviewed and noted the following in the patient's chart:   Medical and social history Use of alcohol , tobacco or illicit drugs  Current medications and supplements including opioid prescriptions. Patient is currently taking opioid prescriptions. Information provided to patient regarding non-opioid alternatives. Patient advised to discuss non-opioid treatment plan with their provider. Functional ability and status Nutritional status Physical activity Advanced directives List of other physicians Hospitalizations, surgeries, and ER visits in previous 12 months Vitals Screenings to include cognitive, depression, and falls Referrals and appointments  In addition, I have reviewed and discussed with patient certain preventive protocols, quality metrics, and best practice recommendations. A written personalized care plan for preventive services as well as general preventive health recommendations were provided to patient.   Ellouise VEAR Haws, LPN   [id number]/11/[id number]   After Visit Summary: (MyChart) Due to this being a telephonic visit, the after visit summary with patients personalized plan was offered to patient via MyChart   Notes: PCP Follow Up Recommendations: complained of # 8 pain in groin and requested appointment front desk working on it

## 2024-08-21 NOTE — Patient Instructions (Signed)
 It was great seeing you today   I have sent in a pill called Diflucan to take once weekly and a cream you can apply   We will also check some blood work

## 2024-08-21 NOTE — Progress Notes (Signed)
 Subjective:    Patient ID: Robert [name], male    DOB: 04-25-1946, 78 y.o.   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  He is a patient of Dr. Kennyth who I am seeing today for the first time for multiple issues.   His first issue was a chronic issue hemoglobin red burning rash in his groin and underneath his foreskin.  This has been ongoing for months.  He reports that he has excessive sweat in his groin which then causes a rash.  He has tried various over-the-counter antifungals and powders without much improvement.  He also reports that he woke up this morning feeling tired, had a frontal headache,  had been experiencing a queasy feeling in his stomach, some mild.  Yesterday he was in his usual state of health.  He denies any fevers, chills, vomiting, diarrhea or neurological changes.   Review of Systems See HPI   Past Medical History:  Diagnosis Date   Arthritis    CAD (coronary artery disease) 10/10/2011   NSTEMI with DES to the proximal RCA following flow wire evaluation. EF is normal.    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    left   Squamous cell carcinoma 12/17/2016   Stroke ([name])    found on MRI - pt unaware had stroke approx 7 years ago     Social History   Socioeconomic History   Marital status: Married    Spouse name: Not on file   Number of children: Not on file   Years of education: Not on file   Highest education level: 12th grade  Occupational History    Comment: Retired -[employer]  Tobacco Use   Smoking status: Former    Current packs/day: 0.00    Average packs/day: 2.0 packs/day for 20.0 years (40.0 ttl pk-yrs)    Types: Cigarettes    Start date: 11/14/1962    Quit date: 11/14/1982    Years since quitting: 41.7   Smokeless tobacco: Never  Vaping Use   Vaping status: Never Used  Substance and Sexual Activity   Alcohol  use: Not Currently    Comment: occ   Drug use: No   Sexual activity: Not on file  Other Topics Concern   Not on file  Social History Narrative   Pt lives with his wife. 1 and a half story home  No issues with stairs. 12th grade education   Social Drivers of Health   Financial Resource Strain: Low Risk  (08/21/2024)   Overall Financial Resource Strain (CARDIA)    Difficulty of Paying Living Expenses: Not hard at all  Food Insecurity: No Food Insecurity (08/21/2024)   Hunger Vital Sign    Worried About Running Out of Food in the Last Year:  Never true    Ran Out of Food in the Last Year: Never true  Transportation Needs: No Transportation Needs (08/21/2024)   PRAPARE - Administrator, Civil Service (Medical): No    Lack of Transportation (Non-Medical): No  Physical Activity: Inactive (08/21/2024)   Exercise Vital Sign    Days of Exercise per Week: 0 days    Minutes of Exercise per Session: 0 min  Stress: No Stress Concern Present (08/21/2024)   [employer] of Occupational Health - Occupational Stress Questionnaire    Feeling of Stress: Not at all  Social Connections: Moderately Integrated (08/21/2024)   Social Connection and Isolation Panel    Frequency of Communication with Friends and Family: Twice a week    Frequency of Social Gatherings with Friends and Family: More  than three times a week    Attends Religious Services: Never    Database administrator or Organizations: Yes    Attends Banker Meetings: 1 to 4 times per year    Marital Status: Married  Catering manager Violence: Not At Risk (08/21/2024)   Humiliation, Afraid, Rape, and Kick questionnaire    Fear of Current or Ex-Partner: No    Emotionally Abused: No    Physically Abused: No    Sexually Abused: No    Past Surgical History:  Procedure Laterality Date   BACK SURGERY     CATARACT EXTRACTION Bilateral 2019   CATARACT EXTRACTION W/ INTRAOCULAR LENS IMPLANT Bilateral 01/2019   CERVICAL DISC SURGERY  1980's?   CORONARY ANGIOPLASTY WITH STENT PLACEMENT  10/13/2011   DES to the proximal RCA. Dr Swaziland   FRACTIONAL FLOW RESERVE WIRE Right 10/13/2011   Procedure: FRACTIONAL FLOW RESERVE WIRE;  Surgeon: Peter M Swaziland, MD;  Location: [facility] CATH LAB;  Service: Cardiovascular;  Laterality: Right;   HERNIA REPAIR     INGUINAL HERNIA REPAIR  10/23/2012   Procedure: HERNIA REPAIR INGUINAL ADULT;  Surgeon: Krystal JINNY Russell, MD;  Location: [facility] OR;  Service: General;  Laterality: Left;  Left lower quadrant; left inguinal hernia repair with mesh   INSERTION OF MESH  10/23/2012   Procedure: INSERTION OF MESH;  Surgeon: Krystal JINNY Russell, MD;  Location: [facility] OR;  Service: General;  Laterality: Left;  Left lower quadrant   LEFT HEART CATHETERIZATION WITH CORONARY ANGIOGRAM N/A 10/13/2011   Procedure: LEFT HEART CATHETERIZATION WITH CORONARY ANGIOGRAM;  Surgeon: Peter M Swaziland, MD;  Location: [facility] CATH LAB;  Service: Cardiovascular;  Laterality: N/A;   PERCUTANEOUS CORONARY STENT INTERVENTION (PCI-S) Right 10/13/2011   Procedure: PERCUTANEOUS CORONARY STENT INTERVENTION (PCI-S);  Surgeon: Peter M Swaziland, MD;  Location: [facility] CATH LAB;  Service: Cardiovascular;  Laterality: Right;   SHOULDER ARTHROSCOPY W/ ROTATOR CUFF REPAIR Left 1990's   TOTAL HIP ARTHROPLASTY Left 05/14/2021   Procedure: LEFT TOTAL HIP  ARTHROPLASTY ANTERIOR APPROACH;  Surgeon: Vernetta Lonni GRADE, MD;  Location: [facility] ORS;  Service: Orthopedics;  Laterality: Left;   TOTAL KNEE ARTHROPLASTY Right 02/20/2023   Procedure: TOTAL KNEE ARTHROPLASTY;  Surgeon: Melodi Lerner, MD;  Location: [facility] ORS;  Service: Orthopedics;  Laterality: Right;   TRIGGER FINGER RELEASE Left    3rd and 4th digits    Family History  Problem Relation Age of Onset   Cancer Mother        lung   Heart disease Father        heart attack    Cancer Father        colon   Migraines Neg  Hx    Headache Neg Hx     Allergies  Allergen Reactions   Sumatriptan Shortness Of Breath    REACTION: sob, headache scalp sensitivity   Penicillins Itching    Tolerated Cephalosporin Date: 02/21/23.     Empagliflozin Diarrhea   Lipitor [Atorvastatin ] Nausea And Vomiting   Oxycodone  Itching   Simbrinza [Brinzolamide-Brimonidine ]     Itching of eye    Current Outpatient Medications on File Prior to Visit  Medication Sig Dispense Refill   acetaminophen  (TYLENOL ) 500 MG tablet Take 1,000 mg by mouth every 6 (six) hours as needed for moderate pain.     Ascorbic Acid  (VITAMIN C) 1000 MG tablet Take 1,000 mg by mouth daily.     azelastine  (ASTELIN ) 0.1 % nasal spray Place 1 spray into both nostrils daily as needed for rhinitis.     B Complex-C (SUPER B COMPLEX PO) Take 1 tablet by mouth daily.     brimonidine  (ALPHAGAN ) 0.2 % ophthalmic solution Place 1 drop into the right eye in the morning and at bedtime.     dicyclomine  (BENTYL ) 10 MG capsule Take 10 mg by mouth 3 (three) times daily as needed for spasms.     ezetimibe  (ZETIA ) 10 MG tablet Take 5 mg by mouth daily.     fluticasone  (FLONASE ) 50 MCG/ACT nasal spray Place 1 spray into both nostrils daily as needed for allergies.     latanoprost  (XALATAN ) 0.005 % ophthalmic solution Place 1 drop into the right eye at bedtime.     metFORMIN  (GLUCOPHAGE ) 1000 MG tablet Take 1 tablet (1,000 mg total) by mouth 2 (two)  times daily with a meal.     Multiple Vitamins-Minerals (MULTIVITAMINS THER. W/MINERALS) TABS Take 1 tablet by mouth daily.     naloxone (NARCAN) nasal spray 4 mg/0.1 mL Place 0.4 mg into the nose as needed.     nitroGLYCERIN  (NITROSTAT ) 0.4 MG SL tablet PLACE 1 TABLET UNDER THE TONGUE EVERY 5 MINUTES AS NEEDED FOR CHEST PAIN. 25 tablet 1   pantoprazole  (PROTONIX ) 40 MG tablet Take 40 mg by mouth daily.     Rimegepant Sulfate (NURTEC) 75 MG TBDP Take 1 tablet (75 mg total) by mouth daily as needed. For migraines. Take as close to onset of migraine as possible. One daily maximum. 10 tablet 11   rosuvastatin  (CRESTOR ) 40 MG tablet Take 40 mg by mouth daily.     tamsulosin  (FLOMAX ) 0.4 MG CAPS capsule TAKE 1 CAPSULE BY MOUTH EVERY DAY 30 capsule 1   traMADol  (ULTRAM ) 50 MG tablet Take 100 mg by mouth every 6 (six) hours as needed (migraines).     vitamin E  180 MG (400 UNITS) capsule Take 400 Units by mouth daily.     No current facility-administered medications on file prior to visit.    BP 120/80   Pulse (!) 58   Temp 98.1 F (36.7 C) (Oral)   Ht 6' 1 (1.854 m)   Wt 201 lb (91.2 kg)   SpO2 96%   BMI 26.52 kg/m       Objective:   Physical Exam Vitals and nursing note reviewed.  Constitutional:      Appearance: Normal appearance.  Cardiovascular:     Rate and Rhythm: Normal rate and regular rhythm.     Pulses: Normal pulses.     Heart sounds: Normal heart sounds.  Pulmonary:     Effort: Pulmonary effort is normal.     Breath sounds: Normal breath sounds.  Abdominal:  General: Abdomen is flat. Bowel sounds are normal.     Palpations: Abdomen is soft.  Genitourinary:    Penis: Uncircumcised.      Comments: Does have a red flat rash with regular borders noted in his groin folds as well as on his.  Retracting his foreskin showed red flat rash on the shaft of his penis. Musculoskeletal:        General: Normal range of motion.  Skin:    General: Skin is warm and dry.   Neurological:     General: No focal deficit present.     Mental Status: He is oriented to person, place, and time.     Cranial Nerves: Cranial nerves 2-12 are intact.     Sensory: Sensation is intact.     Motor: Motor function is intact.     Coordination: Coordination is intact.     Gait: Gait is intact.  Psychiatric:        Mood and Affect: Mood normal.        Behavior: Behavior normal.        Thought Content: Thought content normal.        Judgment: Judgment normal.        Assessment & Plan:  1. Fungal infection (Primary) - Will treat with Diflucan and Nizoral cream  - Follow up with PCP if this does not resolve  - ketoconazole (NIZORAL) 2 % cream; Apply 1 Application topically daily.  Dispense: 15 g; Refill: 0 - fluconazole (DIFLUCAN) 150 MG tablet; Take one tablet weekly for two weeks.  Dispense: 2 tablet; Refill: 0  2. Other fatigue - Unknown cause. Started less than 24 hours ago. Will check CBC and BMP. Advised to rest and stay hydrated. Follow up with PCP if symptoms not improving over the next 3-5 days or sooner if symptom worsen - CBC; Future - Basic Metabolic Panel; Future - Basic Metabolic Panel - CBC  3. Nausea   Darleene Shape, NP

## 2024-08-21 NOTE — Patient Instructions (Signed)
 Mr. Fusselman,  Thank you for taking the time for your Medicare Wellness Visit. I appreciate your continued commitment to your health goals. Please review the care plan we discussed, and feel free to reach out if I can assist you further.  Medicare recommends these wellness visits once per year to help you and your care team stay ahead of potential health issues. These visits are designed to focus on prevention, allowing your provider to concentrate on managing your acute and chronic conditions during your regular appointments.  Please note that Annual Wellness Visits do not include a physical exam. Some assessments may be limited, especially if the visit was conducted virtually. If needed, we may recommend a separate in-person follow-up with your provider.  Ongoing Care Seeing your primary care provider every 3 to 6 months helps us  monitor your health and provide consistent, personalized care.   Referrals If a referral was made during today's visit and you haven't received any updates within two weeks, please contact the referred provider directly to check on the status.  Recommended Screenings:  Health Maintenance  Topic Date Due   Complete foot exam   05/28/2020   Eye exam for diabetics  04/16/2024   Medicare Annual Wellness Visit  09/20/2024   Hemoglobin A1C  12/26/2024   Yearly kidney function blood test for diabetes  06/25/2025   Yearly kidney health urinalysis for diabetes  06/25/2025   DTaP/Tdap/Td vaccine (4 - Td or Tdap) 12/01/2027   Pneumococcal Vaccine for age over 62  Completed   Zoster (Shingles) Vaccine  Completed   Meningitis B Vaccine  Aged Out   Flu Shot  Discontinued   Colon Cancer Screening  Discontinued   COVID-19 Vaccine  Discontinued   Hepatitis C Screening  Discontinued       09/21/2023    9:53 AM  Advanced Directives  Does Patient Have a Medical Advance Directive? Yes  Type of Estate agent of [place];Living will  Copy of Healthcare  Power of Attorney in Chart? No - copy requested   Advance Care Planning is important because it: Ensures you receive medical care that aligns with your values, goals, and preferences. Provides guidance to your family and loved ones, reducing the emotional burden of decision-making during critical moments.  Vision: Annual vision screenings are recommended for early detection of glaucoma, cataracts, and diabetic retinopathy. These exams can also reveal signs of chronic conditions such as diabetes and high blood pressure.  Dental: Annual dental screenings help detect early signs of oral cancer, gum disease, and other conditions linked to overall health, including heart disease and diabetes.  Please see the attached documents for additional preventive care recommendations.

## 2024-09-16 ENCOUNTER — Encounter: Payer: Self-pay | Admitting: Radiology

## 2024-12-05 ENCOUNTER — Telehealth: Payer: Self-pay

## 2024-12-05 NOTE — Telephone Encounter (Signed)
 Transition Care Management Follow-up Telephone Call Date of discharge and from where: 12/04/2024 How have you been since you were released from the hospital? NO Any questions or concerns? Yes  Items Reviewed: Did the pt receive and understand the discharge instructions provided? No  Medications obtained and verified? Yes  Other? No  Any new allergies since your discharge? Yes  Dietary orders reviewed? Yes Do you have support at home? Yes   Home Care and Equipment/Supplies: Were home health services ordered? not applicable If so, what is the name of the agency?   Has the agency set up a time to come to the patient's home? not applicable Were any new equipment or medical supplies ordered?  No What is the name of the medical supply agency?  Were you able to get the supplies/equipment? not applicable Do you have any questions related to the use of the equipment or supplies? No  Functional Questionnaire: (I = Independent and D = Dependent) ADLs: I  Bathing/Dressing- I  Meal Prep- I  Eating- I  Maintaining continence- I  Transferring/Ambulation- I  Managing Meds- I  Follow up appointments reviewed:  PCP Hospital f/u appt confirmed? No  Specialist Hospital f/u appt confirmed? Yes  Scheduled to see 12/09/2024  Are transportation arrangements needed? No  If their condition worsens, is the pt aware to call PCP or go to the Emergency Dept.? Yes Was the patient provided with contact information for the PCP's office or ED? Yes Was to pt encouraged to call back with questions or concerns? Yes

## 2024-12-26 ENCOUNTER — Ambulatory Visit: Admitting: Family Medicine

## 2025-09-01 ENCOUNTER — Ambulatory Visit
